# Patient Record
Sex: Female | Born: 1965
Health system: Southern US, Community
[De-identification: ages and names within clinical notes are randomized; demographics above are authoritative.]

## PROBLEM LIST (undated history)

## (undated) DIAGNOSIS — F319 Bipolar disorder, unspecified: Secondary | ICD-10-CM

## (undated) DIAGNOSIS — M797 Fibromyalgia: Secondary | ICD-10-CM

## (undated) DIAGNOSIS — C569 Malignant neoplasm of unspecified ovary: Secondary | ICD-10-CM

## (undated) DIAGNOSIS — K7689 Other specified diseases of liver: Secondary | ICD-10-CM

## (undated) DIAGNOSIS — G4730 Sleep apnea, unspecified: Secondary | ICD-10-CM

## (undated) DIAGNOSIS — C4359 Malignant melanoma of other part of trunk: Secondary | ICD-10-CM

## (undated) DIAGNOSIS — F32A Depression, unspecified: Secondary | ICD-10-CM

## (undated) DIAGNOSIS — J449 Chronic obstructive pulmonary disease, unspecified: Secondary | ICD-10-CM

## (undated) DIAGNOSIS — T7840XA Allergy, unspecified, initial encounter: Secondary | ICD-10-CM

## (undated) DIAGNOSIS — F419 Anxiety disorder, unspecified: Secondary | ICD-10-CM

## (undated) DIAGNOSIS — M549 Dorsalgia, unspecified: Secondary | ICD-10-CM

## (undated) DIAGNOSIS — Q613 Polycystic kidney, unspecified: Secondary | ICD-10-CM

## (undated) DIAGNOSIS — M722 Plantar fascial fibromatosis: Secondary | ICD-10-CM

## (undated) DIAGNOSIS — M1990 Unspecified osteoarthritis, unspecified site: Secondary | ICD-10-CM

## (undated) DIAGNOSIS — E7800 Pure hypercholesterolemia, unspecified: Secondary | ICD-10-CM

## (undated) DIAGNOSIS — Z8719 Personal history of other diseases of the digestive system: Secondary | ICD-10-CM

## (undated) DIAGNOSIS — I499 Cardiac arrhythmia, unspecified: Secondary | ICD-10-CM

## (undated) DIAGNOSIS — G8929 Other chronic pain: Secondary | ICD-10-CM

## (undated) DIAGNOSIS — F329 Major depressive disorder, single episode, unspecified: Secondary | ICD-10-CM

## (undated) DIAGNOSIS — I10 Essential (primary) hypertension: Secondary | ICD-10-CM

## (undated) DIAGNOSIS — K431 Incisional hernia with gangrene: Secondary | ICD-10-CM

## (undated) DIAGNOSIS — N200 Calculus of kidney: Secondary | ICD-10-CM

## (undated) DIAGNOSIS — C539 Malignant neoplasm of cervix uteri, unspecified: Secondary | ICD-10-CM

## (undated) DIAGNOSIS — J42 Unspecified chronic bronchitis: Secondary | ICD-10-CM

## (undated) DIAGNOSIS — K219 Gastro-esophageal reflux disease without esophagitis: Secondary | ICD-10-CM

## (undated) DIAGNOSIS — K589 Irritable bowel syndrome without diarrhea: Secondary | ICD-10-CM

## (undated) DIAGNOSIS — IMO0002 Reserved for concepts with insufficient information to code with codable children: Secondary | ICD-10-CM

## (undated) HISTORY — PX: EYE SURGERY: SHX253

## (undated) HISTORY — PX: HERNIA REPAIR: SHX51

## (undated) HISTORY — PX: TUBAL LIGATION: SHX77

## (undated) HISTORY — DX: Plantar fascial fibromatosis: M72.2

## (undated) HISTORY — DX: Allergy, unspecified, initial encounter: T78.40XA

## (undated) HISTORY — PX: CARPAL TUNNEL RELEASE: SHX101

## (undated) HISTORY — DX: Irritable bowel syndrome, unspecified: K58.9

## (undated) HISTORY — PX: APPENDECTOMY: SHX54

## (undated) HISTORY — DX: Other specified diseases of liver: K76.89

## (undated) HISTORY — DX: Essential (primary) hypertension: I10

## (undated) HISTORY — DX: Fibromyalgia: M79.7

## (undated) HISTORY — DX: Bipolar disorder, unspecified: F31.9

---

## 1995-07-29 HISTORY — PX: LAPAROSCOPIC ABDOMINAL EXPLORATION: SHX6249

## 1995-07-29 HISTORY — PX: ABDOMINAL HYSTERECTOMY: SHX81

## 1996-07-28 HISTORY — PX: CHOLECYSTECTOMY OPEN: SUR202

## 2008-07-28 HISTORY — PX: SHOULDER SURGERY: SHX246

## 2009-05-07 ENCOUNTER — Ambulatory Visit (HOSPITAL_COMMUNITY): Payer: Self-pay | Admitting: Psychiatry

## 2009-05-15 ENCOUNTER — Ambulatory Visit (HOSPITAL_COMMUNITY): Payer: Self-pay | Admitting: Psychiatry

## 2009-05-28 ENCOUNTER — Ambulatory Visit (HOSPITAL_COMMUNITY): Payer: Self-pay | Admitting: Psychiatry

## 2009-06-06 ENCOUNTER — Ambulatory Visit (HOSPITAL_COMMUNITY): Payer: Self-pay | Admitting: Psychiatry

## 2009-06-15 ENCOUNTER — Ambulatory Visit (HOSPITAL_COMMUNITY): Payer: Self-pay | Admitting: Psychiatry

## 2009-06-27 ENCOUNTER — Ambulatory Visit (HOSPITAL_COMMUNITY): Payer: Self-pay | Admitting: Psychiatry

## 2009-07-04 ENCOUNTER — Ambulatory Visit (HOSPITAL_COMMUNITY): Payer: Self-pay | Admitting: Psychiatry

## 2009-07-06 ENCOUNTER — Ambulatory Visit (HOSPITAL_COMMUNITY): Payer: Self-pay | Admitting: Psychiatry

## 2009-07-11 ENCOUNTER — Ambulatory Visit (HOSPITAL_COMMUNITY): Payer: Self-pay | Admitting: Psychiatry

## 2009-07-17 ENCOUNTER — Ambulatory Visit (HOSPITAL_COMMUNITY): Payer: Self-pay | Admitting: Psychiatry

## 2009-07-26 ENCOUNTER — Ambulatory Visit (HOSPITAL_COMMUNITY): Payer: Self-pay | Admitting: Psychiatry

## 2009-08-15 ENCOUNTER — Ambulatory Visit (HOSPITAL_COMMUNITY): Payer: Self-pay | Admitting: Psychiatry

## 2009-08-20 ENCOUNTER — Ambulatory Visit (HOSPITAL_COMMUNITY): Payer: Self-pay | Admitting: Psychiatry

## 2009-09-17 ENCOUNTER — Ambulatory Visit (HOSPITAL_COMMUNITY): Payer: Self-pay | Admitting: Psychiatry

## 2009-11-06 ENCOUNTER — Ambulatory Visit (HOSPITAL_COMMUNITY): Payer: Self-pay | Admitting: Licensed Clinical Social Worker

## 2009-11-07 ENCOUNTER — Ambulatory Visit (HOSPITAL_COMMUNITY): Payer: Self-pay | Admitting: Psychiatry

## 2009-11-30 ENCOUNTER — Ambulatory Visit (HOSPITAL_COMMUNITY): Payer: Self-pay | Admitting: Licensed Clinical Social Worker

## 2009-12-18 ENCOUNTER — Ambulatory Visit (HOSPITAL_COMMUNITY): Payer: Self-pay | Admitting: Licensed Clinical Social Worker

## 2010-02-05 ENCOUNTER — Ambulatory Visit (HOSPITAL_COMMUNITY): Payer: Self-pay | Admitting: Licensed Clinical Social Worker

## 2010-02-06 ENCOUNTER — Ambulatory Visit (HOSPITAL_COMMUNITY): Payer: Self-pay | Admitting: Psychiatry

## 2010-03-20 ENCOUNTER — Ambulatory Visit (HOSPITAL_COMMUNITY): Payer: Self-pay | Admitting: Licensed Clinical Social Worker

## 2010-04-19 ENCOUNTER — Ambulatory Visit (HOSPITAL_COMMUNITY): Payer: Self-pay | Admitting: Licensed Clinical Social Worker

## 2010-05-15 ENCOUNTER — Ambulatory Visit (HOSPITAL_COMMUNITY): Payer: Self-pay | Admitting: Psychiatry

## 2010-05-30 ENCOUNTER — Ambulatory Visit (HOSPITAL_COMMUNITY): Payer: Self-pay | Admitting: Licensed Clinical Social Worker

## 2010-07-04 ENCOUNTER — Ambulatory Visit (HOSPITAL_COMMUNITY): Payer: Self-pay | Admitting: Licensed Clinical Social Worker

## 2010-07-17 ENCOUNTER — Ambulatory Visit (HOSPITAL_COMMUNITY): Payer: Self-pay | Admitting: Licensed Clinical Social Worker

## 2010-07-18 ENCOUNTER — Ambulatory Visit (HOSPITAL_COMMUNITY): Payer: Self-pay | Admitting: Psychiatry

## 2010-07-24 ENCOUNTER — Ambulatory Visit (HOSPITAL_COMMUNITY): Payer: Self-pay | Admitting: Licensed Clinical Social Worker

## 2010-07-31 ENCOUNTER — Ambulatory Visit (HOSPITAL_COMMUNITY)
Admission: RE | Admit: 2010-07-31 | Discharge: 2010-07-31 | Payer: Self-pay | Source: Home / Self Care | Attending: Licensed Clinical Social Worker | Admitting: Licensed Clinical Social Worker

## 2010-08-08 ENCOUNTER — Ambulatory Visit (HOSPITAL_COMMUNITY): Admit: 2010-08-08 | Payer: Self-pay | Admitting: Licensed Clinical Social Worker

## 2010-08-15 ENCOUNTER — Ambulatory Visit (HOSPITAL_COMMUNITY): Admit: 2010-08-15 | Payer: Self-pay | Admitting: Licensed Clinical Social Worker

## 2010-08-23 ENCOUNTER — Ambulatory Visit (HOSPITAL_COMMUNITY): Admit: 2010-08-23 | Payer: Self-pay | Admitting: Psychiatry

## 2010-08-28 ENCOUNTER — Ambulatory Visit (HOSPITAL_COMMUNITY): Admit: 2010-08-28 | Payer: Self-pay | Admitting: Licensed Clinical Social Worker

## 2010-08-28 ENCOUNTER — Encounter (INDEPENDENT_AMBULATORY_CARE_PROVIDER_SITE_OTHER): Payer: PRIVATE HEALTH INSURANCE | Admitting: Licensed Clinical Social Worker

## 2010-08-28 DIAGNOSIS — F3162 Bipolar disorder, current episode mixed, moderate: Secondary | ICD-10-CM

## 2010-09-02 ENCOUNTER — Encounter (HOSPITAL_COMMUNITY): Payer: Self-pay | Admitting: Psychiatry

## 2010-09-05 ENCOUNTER — Encounter (HOSPITAL_COMMUNITY): Payer: PRIVATE HEALTH INSURANCE | Admitting: Licensed Clinical Social Worker

## 2010-11-18 ENCOUNTER — Ambulatory Visit (HOSPITAL_COMMUNITY)
Admission: RE | Admit: 2010-11-18 | Discharge: 2010-11-18 | Disposition: A | Discharge status: Home / Self Care | Payer: PRIVATE HEALTH INSURANCE | Source: Ambulatory Visit | Attending: Neurology | Admitting: Neurology

## 2010-11-18 DIAGNOSIS — R262 Difficulty in walking, not elsewhere classified: Secondary | ICD-10-CM | POA: Insufficient documentation

## 2010-11-18 DIAGNOSIS — M545 Low back pain, unspecified: Secondary | ICD-10-CM | POA: Insufficient documentation

## 2010-11-18 DIAGNOSIS — IMO0001 Reserved for inherently not codable concepts without codable children: Secondary | ICD-10-CM | POA: Insufficient documentation

## 2010-11-18 DIAGNOSIS — M6281 Muscle weakness (generalized): Secondary | ICD-10-CM | POA: Insufficient documentation

## 2010-11-27 ENCOUNTER — Ambulatory Visit (HOSPITAL_COMMUNITY)
Admission: RE | Admit: 2010-11-27 | Discharge: 2010-11-27 | Disposition: A | Discharge status: Home / Self Care | Payer: PRIVATE HEALTH INSURANCE | Source: Ambulatory Visit | Attending: Neurology | Admitting: Neurology

## 2010-11-27 DIAGNOSIS — IMO0001 Reserved for inherently not codable concepts without codable children: Secondary | ICD-10-CM | POA: Insufficient documentation

## 2010-11-27 DIAGNOSIS — M6281 Muscle weakness (generalized): Secondary | ICD-10-CM | POA: Insufficient documentation

## 2010-11-27 DIAGNOSIS — M545 Low back pain, unspecified: Secondary | ICD-10-CM | POA: Insufficient documentation

## 2010-11-27 DIAGNOSIS — R262 Difficulty in walking, not elsewhere classified: Secondary | ICD-10-CM | POA: Insufficient documentation

## 2010-12-06 ENCOUNTER — Ambulatory Visit (HOSPITAL_COMMUNITY)
Admission: RE | Admit: 2010-12-06 | Discharge: 2010-12-06 | Disposition: A | Discharge status: Home / Self Care | Payer: PRIVATE HEALTH INSURANCE | Source: Ambulatory Visit | Attending: *Deleted | Admitting: *Deleted

## 2010-12-12 ENCOUNTER — Ambulatory Visit (HOSPITAL_COMMUNITY): Payer: PRIVATE HEALTH INSURANCE | Admitting: Physical Therapy

## 2011-02-27 ENCOUNTER — Encounter (INDEPENDENT_AMBULATORY_CARE_PROVIDER_SITE_OTHER): Payer: PRIVATE HEALTH INSURANCE | Admitting: Psychiatry

## 2011-02-27 DIAGNOSIS — F3189 Other bipolar disorder: Secondary | ICD-10-CM

## 2011-02-28 NOTE — Progress Notes (Signed)
[name], [name]               ACCOUNT NO.:  [id number]  MEDICAL RECORD NO.:  [id number]  LOCATION:  [facility]                           FACILITY:  [facility]  PHYSICIAN:  Carney Saxton T. Boomer Winders, M.D.   DATE OF BIRTH:  19-Apr-1966                                PROGRESS NOTE  Date; 02/27/11 The patient is a 45 year old, Caucasian, unemployed, single woman who was seen by Dr. Christell Constant in the [place] office.  She recently moved to this area and now wants to establish care in this office.  The patient moved to this area almost 7 months ago.  She tried to see [facility]. However, she did not like the provider and therapists and wants to continue her care at [facility].  The patient has history of bipolar disorder, PTSD and panic disorder.  She has been taking multiple psychiatric medications.  However, recently, she has been out of her trazodone and complained of poor sleep, racing thoughts and increased anxiety.  She was also diagnosed recently with fibromyalgia and is taking tramadol and gabapentin for her fibromyalgia pain.  The patient is overall stable on her medication.  She reported no side effects of medication.  However, on occasion, she complained of nausea which has been for the past 4 months.  She has not seen her primary care doctor but is trying to get an appointment very soon.  The patient denies any active or passive suicidal thoughts but endorsed some residual mood lability, anger, crying spells and agitation.  She does not want to change her medication which has helped her in the past 2 years.  PAST PSYCHIATRIC HISTORY: The patient has at least nine psychiatric admissions.  Her last psychiatric admission was at [facility] five years ago due to suicidal attempt and thinking.  She admitted that she has history of paranoid behavior, auditory hallucinations, severe anger and then going into severe depression.  In the past, she had tried Zoloft, Risperdal, Abilify, Seroquel,  Saphris, Effexor, Xanax and Klonopin.  She reported that some of these medications did not work at all and some of the medications cause increased weight gain and blood sugar.  PSYCHOLOGICAL HISTORY: The patient was born and raised in [place], [place].  She has been married three times.  She has two girls and two stepdaughters. She was adopted when she was an infant.  Recently, she moved to this area to live close to her boyfriend.  EDUCATION AND WORK HISTORY: The patient has 2 years of college.  Currently, she is not working.  MEDICAL HISTORY: The patient has multiple medical problems.  She has history of asthma, neuropathy and degenerative disease and was recently diagnosed with fibromyalgia.  Her primary care doctor is Dr. Alwyn Ren in [place]. Her neurologist is Dr. Craige Cotta at [facility].  CURRENT MEDICATIONS: 1. Ventolin cuff inhaler. 2. Lexapro 20 mg daily. 3. Neurontin 300 mg twice daily. 4. Tramadol as needed. 5. Lamictal 200 mg daily. 6. Trazodone 100 mg which she has not been taking for the past few     months.  ALLERGIES: 1. TETRACYCLINE. 2. ERYTHROMYCIN. 3. ASPIRIN. 4. SULFA.  MENTAL STATUS EXAM: The patient is a pleasant female who is casually dressed.  She appears to be her stated age.  She is fairly groomed.  Her speech is soft, relevant and coherent.  Her thought process is logical, linear and goal directed.  Her attention and concentration were distracted at times. She denies any active or passive suicidal thoughts or any auditory, visual or tactile hallucinations.  Her fund of knowledge is adequate. There are no delusions, psychosis or obsessions present at this time. She is alert and oriented x3.  Her insight, judgment and impulse control were okay.  DIAGNOSIS: AXIS I:  Bipolar disorder, not otherwise specified; post traumatic stress disorder by history; panic disorder by history. AXIS II:  Deferred. AXIS III:  See medical  history. AXIS IV:  Mild to moderate. AXIS V:  55-60.  PLAN: I talked to the patient at length.  We will resume her trazodone which will help her insomnia and residual anxiety.  I have recommended she see a primary care doctor for persistent nausea.  However, I also recommended she take Lamictal 200 mg 1/2 twice daily as Lamictal may be causing nausea.  I explained the risks and benefits of the medication including any metabolic side effects or rash with the Lamictal.  She will continue Lexapro 20 mg daily.  I have recommended she bring the list of all of her medications on her next followup.  I will also schedule an appointment with a therapist for increased coping and social skills.  We will get collateral information from her primary care doctor including any recent lab if she had any done.  We also talked about safety plan.  If she has worsening of the symptoms or at any time has suicidal thoughts or homicidal thoughts, she needs to call 9-1-1 or go to local ER.  I will see her again in 4 weeks.     Lyrik Buresh T. Lolly Mustache, M.D.     STA/MEDQ  D:  02/27/2011  T:  02/27/2011  Job:  [id number]  Electronically Signed by Kathryne Sharper M.D. on 02/28/2011 09:44:18 AM

## 2011-03-10 ENCOUNTER — Ambulatory Visit (INDEPENDENT_AMBULATORY_CARE_PROVIDER_SITE_OTHER): Payer: PRIVATE HEALTH INSURANCE | Admitting: Psychiatry

## 2011-03-10 DIAGNOSIS — F319 Bipolar disorder, unspecified: Secondary | ICD-10-CM

## 2011-03-10 DIAGNOSIS — F410 Panic disorder [episodic paroxysmal anxiety] without agoraphobia: Secondary | ICD-10-CM

## 2011-03-10 DIAGNOSIS — F4310 Post-traumatic stress disorder, unspecified: Secondary | ICD-10-CM

## 2011-03-18 ENCOUNTER — Encounter (HOSPITAL_COMMUNITY): Payer: PRIVATE HEALTH INSURANCE | Admitting: Psychiatry

## 2011-03-26 ENCOUNTER — Encounter (HOSPITAL_COMMUNITY): Payer: PRIVATE HEALTH INSURANCE | Admitting: Psychiatry

## 2011-03-27 ENCOUNTER — Encounter (INDEPENDENT_AMBULATORY_CARE_PROVIDER_SITE_OTHER): Payer: PRIVATE HEALTH INSURANCE | Admitting: Psychiatry

## 2011-03-27 DIAGNOSIS — F410 Panic disorder [episodic paroxysmal anxiety] without agoraphobia: Secondary | ICD-10-CM

## 2011-03-27 DIAGNOSIS — F4310 Post-traumatic stress disorder, unspecified: Secondary | ICD-10-CM

## 2011-03-27 DIAGNOSIS — F319 Bipolar disorder, unspecified: Secondary | ICD-10-CM

## 2011-04-08 ENCOUNTER — Encounter (HOSPITAL_COMMUNITY): Payer: PRIVATE HEALTH INSURANCE | Admitting: Psychiatry

## 2011-05-05 ENCOUNTER — Encounter (INDEPENDENT_AMBULATORY_CARE_PROVIDER_SITE_OTHER): Payer: PRIVATE HEALTH INSURANCE | Admitting: Psychiatry

## 2011-05-05 DIAGNOSIS — F319 Bipolar disorder, unspecified: Secondary | ICD-10-CM

## 2011-05-06 ENCOUNTER — Encounter (INDEPENDENT_AMBULATORY_CARE_PROVIDER_SITE_OTHER): Payer: PRIVATE HEALTH INSURANCE | Admitting: Psychiatry

## 2011-05-06 DIAGNOSIS — F3189 Other bipolar disorder: Secondary | ICD-10-CM

## 2011-05-16 ENCOUNTER — Encounter (INDEPENDENT_AMBULATORY_CARE_PROVIDER_SITE_OTHER): Payer: PRIVATE HEALTH INSURANCE | Admitting: Psychiatry

## 2011-05-16 DIAGNOSIS — F4310 Post-traumatic stress disorder, unspecified: Secondary | ICD-10-CM

## 2011-05-16 DIAGNOSIS — F410 Panic disorder [episodic paroxysmal anxiety] without agoraphobia: Secondary | ICD-10-CM

## 2011-05-16 DIAGNOSIS — F319 Bipolar disorder, unspecified: Secondary | ICD-10-CM

## 2011-05-21 ENCOUNTER — Encounter (INDEPENDENT_AMBULATORY_CARE_PROVIDER_SITE_OTHER): Payer: PRIVATE HEALTH INSURANCE | Admitting: Psychiatry

## 2011-05-21 DIAGNOSIS — F319 Bipolar disorder, unspecified: Secondary | ICD-10-CM

## 2011-05-21 DIAGNOSIS — F4310 Post-traumatic stress disorder, unspecified: Secondary | ICD-10-CM

## 2011-05-26 ENCOUNTER — Emergency Department (HOSPITAL_COMMUNITY)
Admission: EM | Admit: 2011-05-26 | Discharge: 2011-05-26 | Disposition: A | Discharge status: Home / Self Care | Payer: PRIVATE HEALTH INSURANCE | Attending: Emergency Medicine | Admitting: Emergency Medicine

## 2011-05-26 ENCOUNTER — Emergency Department (HOSPITAL_COMMUNITY): Payer: PRIVATE HEALTH INSURANCE

## 2011-05-26 ENCOUNTER — Encounter: Payer: Self-pay | Admitting: *Deleted

## 2011-05-26 DIAGNOSIS — Y92009 Unspecified place in unspecified non-institutional (private) residence as the place of occurrence of the external cause: Secondary | ICD-10-CM | POA: Insufficient documentation

## 2011-05-26 DIAGNOSIS — S139XXA Sprain of joints and ligaments of unspecified parts of neck, initial encounter: Secondary | ICD-10-CM | POA: Insufficient documentation

## 2011-05-26 DIAGNOSIS — S46919A Strain of unspecified muscle, fascia and tendon at shoulder and upper arm level, unspecified arm, initial encounter: Secondary | ICD-10-CM

## 2011-05-26 DIAGNOSIS — M25519 Pain in unspecified shoulder: Secondary | ICD-10-CM | POA: Insufficient documentation

## 2011-05-26 DIAGNOSIS — M542 Cervicalgia: Secondary | ICD-10-CM | POA: Insufficient documentation

## 2011-05-26 DIAGNOSIS — W19XXXA Unspecified fall, initial encounter: Secondary | ICD-10-CM | POA: Insufficient documentation

## 2011-05-26 DIAGNOSIS — S161XXA Strain of muscle, fascia and tendon at neck level, initial encounter: Secondary | ICD-10-CM

## 2011-05-26 HISTORY — DX: Bipolar disorder, unspecified: F31.9

## 2011-05-26 HISTORY — DX: Reserved for concepts with insufficient information to code with codable children: IMO0002

## 2011-05-26 MED ORDER — HYDROMORPHONE HCL 1 MG/ML IJ SOLN
1.0000 mg | Freq: Once | INTRAMUSCULAR | Status: AC
  Administered 2011-05-26: 1 mg via INTRAMUSCULAR
  Filled 2011-05-26: qty 1

## 2011-05-26 MED ORDER — HYDROCODONE-ACETAMINOPHEN 5-325 MG PO TABS: 1.0000 | ORAL_TABLET | ORAL | Status: AC | PRN
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
## 2011-05-26 NOTE — ED Notes (Signed)
Phila collar in place
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
## 2011-05-26 NOTE — ED Notes (Signed)
Patient is resting comfortably. 

## 2011-05-26 NOTE — ED Notes (Signed)
Returned from xray

## 2011-05-29 HISTORY — PX: UPPER GASTROINTESTINAL ENDOSCOPY: SHX188

## 2011-05-29 HISTORY — PX: COLONOSCOPY: SHX174
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
## 2011-06-02 NOTE — ED Provider Notes (Signed)
Medical screening examination/treatment/procedure(s) were performed by non-physician practitioner and as supervising physician I was immediately available for consultation/collaboration.  Milani Lowenstein, MD 06/02/11 0018 

## 2011-06-03 ENCOUNTER — Ambulatory Visit (INDEPENDENT_AMBULATORY_CARE_PROVIDER_SITE_OTHER): Payer: PRIVATE HEALTH INSURANCE | Admitting: Psychiatry

## 2011-06-03 ENCOUNTER — Encounter (HOSPITAL_COMMUNITY): Payer: Self-pay | Admitting: Psychiatry

## 2011-06-03 DIAGNOSIS — F3181 Bipolar II disorder: Secondary | ICD-10-CM

## 2011-06-03 DIAGNOSIS — F3189 Other bipolar disorder: Secondary | ICD-10-CM

## 2011-06-03 NOTE — Patient Instructions (Signed)
Discussed orally .Marland Kitchen..use  coping and relaxation techniques ( journaling, reading , pacing self). Identify and challenge thought patterns.
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
## 2011-06-17 ENCOUNTER — Ambulatory Visit (HOSPITAL_COMMUNITY): Payer: PRIVATE HEALTH INSURANCE | Admitting: Psychiatry

## 2011-06-28 ENCOUNTER — Other Ambulatory Visit (HOSPITAL_COMMUNITY): Payer: Self-pay | Admitting: Psychiatry

## 2011-07-01 ENCOUNTER — Encounter (HOSPITAL_COMMUNITY): Payer: Self-pay | Admitting: Psychiatry

## 2011-07-01 ENCOUNTER — Encounter (HOSPITAL_COMMUNITY): Payer: PRIVATE HEALTH INSURANCE | Admitting: Psychiatry

## 2011-07-01 ENCOUNTER — Ambulatory Visit (INDEPENDENT_AMBULATORY_CARE_PROVIDER_SITE_OTHER): Payer: PRIVATE HEALTH INSURANCE | Admitting: Psychiatry

## 2011-07-01 DIAGNOSIS — F3189 Other bipolar disorder: Secondary | ICD-10-CM

## 2011-07-01 MED ORDER — LAMOTRIGINE 100 MG PO TABS: 100.0000 mg | ORAL_TABLET | Freq: Two times a day (BID) | ORAL | Status: DC

## 2011-07-01 MED ORDER — TRAZODONE HCL 100 MG PO TABS: 50.0000 mg | ORAL_TABLET | Freq: Every day | ORAL | Status: DC

## 2011-07-01 MED ORDER — ESCITALOPRAM OXALATE 20 MG PO TABS: 20.0000 mg | ORAL_TABLET | Freq: Every day | ORAL | Status: DC

## 2011-07-01 NOTE — Progress Notes (Signed)
Patient came for her followup appointment. She has been stable on her current medication. She continued to endorse back pain and seen by pain physician. She has also had verve conduction studies done. She is taking Lamictal 100 mg twice a trazodone 50 to 100 mg at bedtime and Lexapro 20 mg daily. She has been compliant with her medication and reported no side effects. She is sleeping better and her anxiety and agitation is well controlled. She reported she had a good Thanksgiving and she is looking for work her Christmas. She denies any agitation anger or mood swings.  Mental status examination Patient is casually dressed and well groomed. Her speech is soft slow but clear and coherent. She appears anxious and her affect is mood congruent. She denies any active or passive suicidal thinking or homicidal thinking. There are no psychotic symptoms present. She described her mood is anxious. She denies any auditory visual hallucination. She's alert and oriented x3. Her insight judgment and pulse control is okay.  Assessment Bipolar disorder NOS  Plan I will continue his current medication which is Lexapro 20 mg daily Lamictal 100 mg twice a day and trazodone 50-100 mg at bedtime. She denies any itching rash or any other side effects of the medication. I explained the risk and benefits of medication and recommended to call if she has any question concerns about medication. I will see her again in 2 months

## 2011-07-09 ENCOUNTER — Encounter (HOSPITAL_COMMUNITY): Payer: Self-pay | Admitting: Psychiatry

## 2011-07-09 ENCOUNTER — Ambulatory Visit (INDEPENDENT_AMBULATORY_CARE_PROVIDER_SITE_OTHER): Payer: PRIVATE HEALTH INSURANCE | Admitting: Psychiatry

## 2011-07-09 DIAGNOSIS — F3189 Other bipolar disorder: Secondary | ICD-10-CM

## 2011-07-09 DIAGNOSIS — F3181 Bipolar II disorder: Secondary | ICD-10-CM

## 2011-07-09 NOTE — Patient Instructions (Signed)
Discussed orally - use relaxation techniques (breathing techniques, use journal), continue self-care efforts
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
## 2011-07-23 ENCOUNTER — Ambulatory Visit (HOSPITAL_COMMUNITY): Payer: PRIVATE HEALTH INSURANCE | Admitting: Psychiatry

## 2011-07-29 HISTORY — PX: MELANOMA EXCISION: SHX5266

## 2011-09-02 ENCOUNTER — Ambulatory Visit (HOSPITAL_COMMUNITY): Payer: PRIVATE HEALTH INSURANCE | Admitting: Psychiatry

## 2011-09-05 ENCOUNTER — Encounter (HOSPITAL_COMMUNITY): Payer: Self-pay | Admitting: Psychiatry

## 2011-09-05 ENCOUNTER — Ambulatory Visit (INDEPENDENT_AMBULATORY_CARE_PROVIDER_SITE_OTHER): Payer: PRIVATE HEALTH INSURANCE | Admitting: Psychiatry

## 2011-09-05 DIAGNOSIS — F3189 Other bipolar disorder: Secondary | ICD-10-CM

## 2011-09-05 DIAGNOSIS — F3181 Bipolar II disorder: Secondary | ICD-10-CM
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
## 2011-09-05 NOTE — Patient Instructions (Signed)
Discussed orally 

## 2011-09-16 ENCOUNTER — Ambulatory Visit (INDEPENDENT_AMBULATORY_CARE_PROVIDER_SITE_OTHER): Payer: PRIVATE HEALTH INSURANCE | Admitting: Psychiatry

## 2011-09-16 ENCOUNTER — Encounter (HOSPITAL_COMMUNITY): Payer: Self-pay | Admitting: Psychiatry

## 2011-09-16 VITALS — Wt 183.0 lb

## 2011-09-16 DIAGNOSIS — F319 Bipolar disorder, unspecified: Secondary | ICD-10-CM

## 2011-09-16 MED ORDER — HALOPERIDOL 1 MG PO TABS: 1.0000 mg | ORAL_TABLET | Freq: Every day | ORAL | Status: DC

## 2011-09-16 NOTE — Progress Notes (Addendum)
Chief complaint I cannot sleep  History of present illness Patient is 46 year-old Caucasian unemployed single woman who came for her followup appointment. Lately patient has been complaining of more anxiety nervousness and insomnia. She is concerned about her sister who has leukemia and refusing treatment and hospice care. Patient admitted recently she has poor attention and poor concentration and feeling of hopeless. She also endorse increased paranoia and hearing voices in the night time. She also admitted easily irritable angry and agitated. She admitted thinking about her sister making her cry and tearful. She has been isolated and withdrawn. She is compliant with her medication and even taking trazodone 200 mg with little help him sleep. She is also complaining of back pain and unable to move around. She complain of throwing up with Lamictal and wants to try a different medication if possible.  Past psychiatric history Patient has significant history of psychiatric illness with at least 9 psychiatric admission. Her last psychiatric admission was at [facility] 5 years ago. Patient has history of suicidal attempt and thinking. She has history of paranoid behavior and auditory hallucination with severe anger. In the past she had tried Zoloft Risperdal Abilify Seroquel Effexor Xanax Klonopin and saphris. She'll call Seroquel cause weight gain and high blood sugar. She recalled Abilify give her tremors.  Psychosocial history Patient was born in [place]. She has been married 3 times. She has to go spent too stepdaughter. She is living with her boyfriend.  Medical history Patient has history of asthma, neuropathy, and degenerative disease, fibromyalgia and chronic pain. She see Dr. Craige Cotta and [facility]. She has recently blood work but she reported normal.  Alcohol and substance use history Patient denies any history of alcohol or illegal substance use  Family history Patient  told her mother was bipolar  Mental status examination Patient is casually dressed and fairly groomed. She has difficult walking due to back pain. She uses stick for walk. She is easily tearful when she is talking about her sister. She maintained fair eye contact. Her speech is soft slow but clear and coherent. She appears anxious depressed and her affect is constricted. She also endorsed auditory hallucination and paranoid thinking however she denies any active or passive suicidal thoughts or homicidal thoughts. There is no tremors or shakes present. She's alert and oriented x3. Her insight judgment and impulse control is okay  Assessment Axis I bipolar disorder NOS Axis II deferred Axis III see medical history Axis IV moderate Axis V 55-60  Plan I recommended to decrease Lamictal to 100 mg a day due to throw up caused by Lamictal. I will add Haldol 1 mg at bedtime to target the paranoia and hallucination. This will also help increase sleep. She will continue trazodone and Celexa. I have explained risks and benefits of medication including extra pyramidal side effects of Haldol. I recommended to call us if she has any question or concern about the medication or if she feels worsening of the symptoms. I also discussed safety plan that anytime if she having suicidal thinking and homicidal thinking then she need to call 911 or go to local ER. I will see her in 2 weeks. Time spent 30 minutes

## 2011-09-18 ENCOUNTER — Ambulatory Visit (HOSPITAL_COMMUNITY): Payer: PRIVATE HEALTH INSURANCE | Admitting: Psychiatry

## 2011-09-30 ENCOUNTER — Emergency Department (HOSPITAL_COMMUNITY): Payer: PRIVATE HEALTH INSURANCE

## 2011-09-30 ENCOUNTER — Encounter (HOSPITAL_COMMUNITY): Payer: Self-pay | Admitting: *Deleted

## 2011-09-30 ENCOUNTER — Emergency Department (HOSPITAL_COMMUNITY)
Admission: EM | Admit: 2011-09-30 | Discharge: 2011-09-30 | Disposition: A | Discharge status: Home / Self Care | Payer: PRIVATE HEALTH INSURANCE | Attending: Emergency Medicine | Admitting: Emergency Medicine

## 2011-09-30 DIAGNOSIS — IMO0002 Reserved for concepts with insufficient information to code with codable children: Secondary | ICD-10-CM | POA: Insufficient documentation

## 2011-09-30 DIAGNOSIS — F17200 Nicotine dependence, unspecified, uncomplicated: Secondary | ICD-10-CM | POA: Insufficient documentation

## 2011-09-30 DIAGNOSIS — R112 Nausea with vomiting, unspecified: Secondary | ICD-10-CM | POA: Insufficient documentation

## 2011-09-30 DIAGNOSIS — K589 Irritable bowel syndrome without diarrhea: Secondary | ICD-10-CM | POA: Insufficient documentation

## 2011-09-30 DIAGNOSIS — Z87442 Personal history of urinary calculi: Secondary | ICD-10-CM | POA: Insufficient documentation

## 2011-09-30 DIAGNOSIS — R109 Unspecified abdominal pain: Secondary | ICD-10-CM | POA: Insufficient documentation

## 2011-09-30 DIAGNOSIS — J45909 Unspecified asthma, uncomplicated: Secondary | ICD-10-CM | POA: Insufficient documentation

## 2011-09-30 DIAGNOSIS — R6883 Chills (without fever): Secondary | ICD-10-CM | POA: Insufficient documentation

## 2011-09-30 DIAGNOSIS — N390 Urinary tract infection, site not specified: Secondary | ICD-10-CM | POA: Insufficient documentation

## 2011-09-30 DIAGNOSIS — IMO0001 Reserved for inherently not codable concepts without codable children: Secondary | ICD-10-CM | POA: Insufficient documentation

## 2011-09-30 DIAGNOSIS — F319 Bipolar disorder, unspecified: Secondary | ICD-10-CM | POA: Insufficient documentation

## 2011-09-30 LAB — CBC
Hemoglobin: 14.7 g/dL (ref 12.0–15.0)
Platelets: 332 10*3/uL (ref 150–400)
RBC: 4.83 MIL/uL (ref 3.87–5.11)
WBC: 10.2 10*3/uL (ref 4.0–10.5)

## 2011-09-30 LAB — DIFFERENTIAL
Lymphocytes Relative: 45 % (ref 12–46)
Lymphs Abs: 4.6 10*3/uL — ABNORMAL HIGH (ref 0.7–4.0)
Monocytes Relative: 6 % (ref 3–12)
Neutro Abs: 4.7 10*3/uL (ref 1.7–7.7)
Neutrophils Relative %: 46 % (ref 43–77)

## 2011-09-30 LAB — COMPREHENSIVE METABOLIC PANEL
AST: 15 U/L (ref 0–37)
Albumin: 3.6 g/dL (ref 3.5–5.2)
BUN: 8 mg/dL (ref 6–23)
Calcium: 9.6 mg/dL (ref 8.4–10.5)
Chloride: 104 mEq/L (ref 96–112)
GFR calc non Af Amer: >90 mL/min (ref >90)
Glucose, Bld: 100 mg/dL — ABNORMAL HIGH (ref 70–99)
Potassium: 3.6 mEq/L (ref 3.5–5.1)
Sodium: 139 mEq/L (ref 135–145)
Total Protein: 7.2 g/dL (ref 6.0–8.3)

## 2011-09-30 LAB — URINE MICROSCOPIC-ADD ON

## 2011-09-30 LAB — URINALYSIS, ROUTINE W REFLEX MICROSCOPIC
Specific Gravity, Urine: 1.01 (ref 1.005–1.030)
Urobilinogen, UA: 0.2 mg/dL (ref 0.0–1.0)

## 2011-09-30 MED ORDER — ONDANSETRON HCL 4 MG/2ML IJ SOLN
4.0000 mg | Freq: Once | INTRAMUSCULAR | Status: AC
  Administered 2011-09-30: 4 mg via INTRAVENOUS
  Filled 2011-09-30: qty 2

## 2011-09-30 MED ORDER — MORPHINE SULFATE 4 MG/ML IJ SOLN
6.0000 mg | Freq: Once | INTRAMUSCULAR | Status: AC
  Administered 2011-09-30: 6 mg via INTRAVENOUS
  Filled 2011-09-30: qty 2

## 2011-09-30 MED ORDER — DEXTROSE 5 % IV SOLN
1.0000 g | Freq: Once | INTRAVENOUS | Status: DC
  Filled 2011-09-30: qty 10

## 2011-09-30 MED ORDER — HYDROCODONE-ACETAMINOPHEN 5-325 MG PO TABS: 1.0000 | ORAL_TABLET | Freq: Four times a day (QID) | ORAL | Status: AC | PRN

## 2011-09-30 MED ORDER — PROMETHAZINE HCL 25 MG PO TABS: 25.0000 mg | ORAL_TABLET | Freq: Four times a day (QID) | ORAL | Status: DC | PRN

## 2011-09-30 MED ORDER — CEPHALEXIN 500 MG PO CAPS: 500.0000 mg | ORAL_CAPSULE | Freq: Four times a day (QID) | ORAL | Status: AC

## 2011-09-30 MED ORDER — MORPHINE SULFATE 4 MG/ML IJ SOLN
4.0000 mg | Freq: Once | INTRAMUSCULAR | Status: AC
  Administered 2011-09-30: 4 mg via INTRAVENOUS
  Filled 2011-09-30: qty 1

## 2011-09-30 MED ORDER — DEXTROSE 5 % IV SOLN
INTRAVENOUS | Status: AC
  Administered 2011-09-30: 22:00:00
  Filled 2011-09-30: qty 10

## 2011-09-30 NOTE — ED Provider Notes (Signed)
History  This chart was scribed for Tammy Lennert, MD by [name]. This patient was seen in room APA01/APA01 and the patient's care was started at 7:25PM.  CSN: [id number]  Arrival date & time 09/30/11  1910   First MD Initiated Contact with Patient 09/30/11 1921      Chief Complaint  Patient presents with  . Flank Pain   Patient is a 46 y.o. female presenting with flank pain. The history is provided by the patient. No language interpreter was used.  Flank Pain This is a new problem. The current episode started yesterday. The problem occurs constantly. The problem has been gradually worsening. Pertinent negatives include no chest pain, no abdominal pain, no headaches and no shortness of breath. The symptoms are relieved by heat. She has tried a warm compress for the symptoms. The treatment provided mild relief.    [name] is a 46 y.o. female who presents to the Emergency Department complaining of a constant, non-radiating, generalized left flank pain that started gradually yesterday. She reports that the pain has gradually worsened today after she passed a small kidney stone the size of a pencil tip. She states that chills, nausea, emesis and hematuria described as being blood clots are the associated symptoms. She reports that the pain is worsened by movement and improved mildly with heating pads. She relays that she has a h/o kidney stones and relates her past experiences as being similar to the symptoms she is experiencing now. She also has a h/o asthma, IBS and fibromyalgia. She is a current everyday smoker but denies alcohol use.  Pt's PCP is [name] in [place].   Past Medical History  Diagnosis Date  . Degenerative disc disease   . Asthma   . Bipolar 1 disorder   . Bipolar disorder   . Fibromyalgia   . IBS (irritable bowel syndrome) Diagnosed November 2012  . Renal disorder     Past Surgical History  Procedure Date  . Shoulder surgery     left  .  Abdominal hysterectomy   . Abdominal surgery   . Cesarean section   . Cholecystectomy   . Appendectomy   . Upper gastrointestinal endoscopy November 2012  . Colonoscopy November 2012  . Melanoma removed January 2013    removed from back    Family History  Problem Relation Age of Onset  . Bipolar disorder Mother     never diagnosed but patient suspects mother had bipolar disorder.Marland KitchenMarland KitchenMarland KitchenMarland Kitchenpt was adopeted, only knew birth mother for a short period of time.    History  Substance Use Topics  . Smoking status: Current Everyday Smoker -- 0.5 packs/day    Types: Cigarettes  . Smokeless tobacco: Never Used  . Alcohol Use: No     Review of Systems  Constitutional: Positive for chills. Negative for fatigue.  HENT: Negative for congestion, sinus pressure and ear discharge.   Eyes: Negative for discharge.  Respiratory: Negative for cough and shortness of breath.   Cardiovascular: Negative for chest pain.  Gastrointestinal: Positive for nausea and vomiting. Negative for abdominal pain and diarrhea.  Genitourinary: Positive for hematuria and flank pain. Negative for frequency.  Musculoskeletal: Negative for back pain.  Skin: Negative for rash.  Neurological: Negative for seizures and headaches.  Hematological: Negative.   Psychiatric/Behavioral: Negative for hallucinations.    Allergies  Sulfa antibiotics; Seldane; Tetracyclines & related; Aspirin; and Erythromycin  Home Medications   Current Outpatient Rx  Name Route Sig Dispense Refill  . ALBUTEROL  SULFATE (2.5 MG/3ML) 0.083% IN NEBU Nebulization Take 2.5 mg by nebulization 4 (four) times daily as needed. For shortness of breath     . CYCLOBENZAPRINE HCL 10 MG PO TABS Oral Take 10 mg by mouth every 8 (eight) hours as needed. For muscles spasms     . ESCITALOPRAM OXALATE 20 MG PO TABS Oral Take 1 tablet (20 mg total) by mouth daily. 30 tablet 1  . FUROSEMIDE 40 MG PO TABS Oral Take 40 mg by mouth daily.      Marland Kitchen GABAPENTIN 300 MG PO  CAPS Oral Take 300 mg by mouth 3 (three) times daily. For nerve pain     . HALOPERIDOL 1 MG PO TABS Oral Take 1 tablet (1 mg total) by mouth at bedtime. 30 tablet 0  . LAMOTRIGINE 100 MG PO TABS Oral Take 1 tablet (100 mg total) by mouth 2 (two) times daily. 60 tablet 1  . OMEPRAZOLE 40 MG PO CPDR Oral Take 40 mg by mouth daily.      Marland Kitchen POTASSIUM CHLORIDE CRYS ER 20 MEQ PO TBCR Oral Take 20 mEq by mouth 2 (two) times daily.      . TRAZODONE HCL 100 MG PO TABS Oral Take 0.5-1 tablets (50-100 mg total) by mouth at bedtime. 30 tablet 1    Triage Vitals: BP 128/71  Pulse 109  Temp(Src) 98.6 F (37 C) (Oral)  Resp 22  Ht 5' 0.75" (1.543 m)  Wt 183 lb (83.008 kg)  BMI 34.86 kg/m2  SpO2 99%  Physical Exam  Nursing note and vitals reviewed. Constitutional: She is oriented to person, place, and time. She appears well-developed and well-nourished.  HENT:  Head: Normocephalic and atraumatic.  Eyes: Conjunctivae and EOM are normal. No scleral icterus.  Neck: Neck supple. No thyromegaly present.  Cardiovascular: Normal rate and regular rhythm.  Exam reveals no gallop and no friction rub.   No murmur heard. Pulmonary/Chest: Effort normal and breath sounds normal. No stridor. She has no wheezes. She has no rales. She exhibits no tenderness.  Abdominal: She exhibits no distension. There is tenderness (mildly tender LLQ, moderate in the left lower flank). There is no rebound.  Musculoskeletal: Normal range of motion. She exhibits no edema.  Lymphadenopathy:    She has no cervical adenopathy.  Neurological: She is alert and oriented to person, place, and time. Coordination normal.  Skin: No rash noted. No erythema.  Psychiatric: She has a normal mood and affect. Her behavior is normal.    ED Course  Procedures (including critical care time)  DIAGNOSTIC STUDIES: Oxygen Saturation is 99% on room air, normal by my interpretation.    COORDINATION OF CARE: 7:30PM-Discussed treatment plan with pt  and pt agreed to plan. Discussed pain medications and pt refused to take Dilaudid due to nausea related side-effects. 9:29Pm-Pt rechecked and is still feeling left flank pain. Pt turned down another dose of pain medication. Discussed possible kidney infection and cyst like formations on the kidney and bladder with pt and pt acknowledged findings. Advised pt to follow up with PCP in a few days. Will prescribe pain medications and antibiotics in the meantime. Pt is comfortable being discharged home   Labs Reviewed  DIFFERENTIAL - Abnormal; Notable for the following:    Lymphs Abs 4.6 (*)    All other components within normal limits  COMPREHENSIVE METABOLIC PANEL - Abnormal; Notable for the following:    Glucose, Bld 100 (*)    All other components within normal limits  URINALYSIS,  ROUTINE W REFLEX MICROSCOPIC - Abnormal; Notable for the following:    APPearance HAZY (*)    Hgb urine dipstick LARGE (*)    Protein, ur 30 (*)    Leukocytes, UA SMALL (*)    All other components within normal limits  URINE MICROSCOPIC-ADD ON - Abnormal; Notable for the following:    Squamous Epithelial / LPF MANY (*)    Bacteria, UA MANY (*)    All other components within normal limits  CBC   Ct Abdomen Pelvis Wo Contrast  09/30/2011  *RADIOLOGY REPORT*  Clinical Data: Abdominal pain. Patient reports passing a kidney stone today for the first time.  CT ABDOMEN AND PELVIS WITHOUT CONTRAST  Technique:  Multidetector CT imaging of the abdomen and pelvis was performed following the standard protocol without intravenous contrast.  Comparison: No priors.  Findings:  Lung Bases: Linear opacities in the lung bases bilaterally, favored to represent areas of subsegmental atelectasis.  Abdomen/Pelvis:  Diffusely decreased attenuation throughout the hepatic parenchyma, consistent with hepatic steatosis.  There are two large focal low attenuation hepatic lesions, one centered in segment 4A measuring 4.6 x 3.9 cm, and one in  segment six measuring 7.5 x 9.1 cm.  Both of these are near water attenuation, however, they are not fully characterized on today's noncontrast CT examination.  No additional hepatic lesions are otherwise noted. The patient is status post cholecystectomy.  The unenhanced appearance of the pancreas, spleen and bilateral adrenal glands is unremarkable.  No abnormal calcifications are noted within the collecting system of either kidney, along the course of either ureter, or within the lumen of the urinary bladder.  No hydroureteronephrosis.  There is a small 1.3 cm low attenuation lesion extending exophytically off the posterior aspect of the interpolar left kidney, not characterized on this noncontrast CT examination.  No ascites or pneumoperitoneum and no pathologic distension of bowel.  No pathologic adenopathy noted in the abdomen or pelvis. Status post total abdominal hysterectomy and bilateral salpingo- oophorectomy.  Musculoskeletal: There are no aggressive appearing lytic or blastic lesions noted in the visualized portions of the skeleton.  IMPRESSION: 1.  No acute findings in the abdomen or pelvis to account for the patient's symptoms.  Specifically, no abnormal urinary tract calculi are identified. 2.  There are two very large low attenuation lesions in the liver, largest of which measures 7.5 x 9.1 cm in segment six.  Neither of these are adequately characterized on this limited noncontrast CT examination, however, they are favored to represent large cysts. These could be further evaluated with abdominal ultrasound. 3.  Status post cholecystectomy. 4.  1.3 cm low attenuation lesion off the posterior aspect of the interpolar left kidney, not characterized, but statistically likely to represent a cyst.  This could also be further evaluated at ultrasound examination. 5.  Status post total abdominal hysterectomy and bilateral salpingo- oophorectomy.  Original Report Authenticated By: Florencia Reasons, M.D.      No diagnosis found.    MDM  Uti,  Possible kidneystone  The chart was scribed for me under my direct supervision.  I personally performed the history, physical, and medical decision making and all procedures in the evaluation of this patient.Tammy Lennert, MD 09/30/11 [phone]

## 2011-09-30 NOTE — Discharge Instructions (Signed)
Drink plenty of fluids.  Follow up with your md this week or next

## 2011-09-30 NOTE — ED Notes (Signed)
Hematuria, and passed a kidney stone today.  Lt flank pain. n/v

## 2011-10-02 ENCOUNTER — Ambulatory Visit (INDEPENDENT_AMBULATORY_CARE_PROVIDER_SITE_OTHER): Payer: PRIVATE HEALTH INSURANCE | Admitting: Psychiatry

## 2011-10-02 ENCOUNTER — Encounter (HOSPITAL_COMMUNITY): Payer: Self-pay | Admitting: Psychiatry

## 2011-10-02 VITALS — BP 130/80 | HR 80 | Wt 186.8 lb

## 2011-10-02 DIAGNOSIS — F319 Bipolar disorder, unspecified: Secondary | ICD-10-CM

## 2011-10-02 LAB — URINE CULTURE

## 2011-10-02 MED ORDER — CARBAMAZEPINE 100 MG PO CHEW: 100.0000 mg | CHEWABLE_TABLET | Freq: Two times a day (BID) | ORAL | Status: DC

## 2011-10-02 NOTE — Progress Notes (Signed)
Chief complaint I cannot sleep and I more depressed  History of present illness Patient is 46 year-old Caucasian unemployed single woman who came for her followup appointment. Patient endorsed increased anxiety depression and social isolation. She is concerned about her physical health. She recently seen her primary care physician for urinary infection. She was given antibiotic. She continues to have back pain and symptoms of urgency. She is scheduled to have ultrasound for her kidney and liver to rule out polycystic disease. Patient has stopped Lamictal due to persistent nausea. However her nausea still persists which could be due to urinary tract infection. Patient admitted more isolated and having mood swings. She had tried Haldol 1 mg which help her paranoia but she continues to have severe mood swings and depressive thoughts. She reported no side effects of medication. She continued to feel at times hopeless and helpless. She wants to try a different medication.  Current psychiatric medication Trazodone 100 mg at bedtime Lexapro 20 mg daily  Haldol 1 mg at bedtime  Past psychiatric history Patient has significant history of psychiatric illness with at least 9 psychiatric admission. Her last psychiatric admission was at [facility] 5 years ago. Patient has history of suicidal attempt and thinking. She has history of paranoid behavior and auditory hallucination with severe anger. In the past she had tried Zoloft Risperdal Abilify Seroquel Effexor Xanax Klonopin and saphris. She'll call Seroquel cause weight gain and high blood sugar. She recalled Abilify give her tremors.  Psychosocial history Patient was born in [place]. She has been married 3 times. She has to go spent too stepdaughter. She is living with her boyfriend.  Medical history Patient has history of asthma, neuropathy, and degenerative disease, fibromyalgia and chronic pain. She see Dr. Craige Cotta and [facility]. She  has recently blood work which shows urinary tract infection. She is taking antibiotic.   Alcohol and substance use history Patient denies any history of alcohol or illegal substance use  Family history Patient told her mother was bipolar  Mental status examination Patient is casually dressed and fairly groomed. She has difficult walking due to back pain. She uses stick for walk. She is easily tearful and described her mood is depressed and sad. Her affect is constricted. She maintained fair eye contact. Her speech is soft slow but clear and coherent. She admitted paranoia but denies any auditory or visual hallucination. There is no tremors or shakes present. She's alert and oriented x3. Her insight judgment and impulse control is okay  Assessment Axis I bipolar disorder NOS Axis II deferred Axis III see medical history Axis IV moderate Axis V 55-60  Plan I reviewed  Past history, blood results and previous progress notes. She had never tried Tegretol in the past. I will try small does of Tegretol to target mood lability and depression. She will continue Haldol, Lexapro and trazodone at bedtime. I recommended to call us if she has any question or concern about the medication or if she feels worsening of the symptoms. I also discussed safety plan that anytime if she having suicidal thinking and homicidal thinking then she need to call 911 or go to local ER. I will see her in 2 weeks. Time spent 30 minutes

## 2011-10-03 NOTE — ED Notes (Signed)
+   urine Patient treated with Keflex-sensitive to same-chart appended per protocol MD. 

## 2011-10-03 NOTE — ED Notes (Signed)
+   Urine Patient treated with Keflex-sensitive to same-chart appended per protocol MD. 

## 2011-10-06 ENCOUNTER — Ambulatory Visit (INDEPENDENT_AMBULATORY_CARE_PROVIDER_SITE_OTHER): Payer: PRIVATE HEALTH INSURANCE | Admitting: Psychiatry

## 2011-10-06 DIAGNOSIS — F319 Bipolar disorder, unspecified: Secondary | ICD-10-CM

## 2011-10-08 NOTE — Progress Notes (Signed)
Patient:  [name]   DOB: August 05, 1965  MR Number: [id number]  Location: [facility] Center:  [address] [place], [place], [address]  Start: Monday 10/06/2011 11:30 AM End: Monday 10/06/2011 12:20 PM  Provider/Observer: Florencia Reasons, MSW, LCSW  Billing Code/Service: [phone] psychotherapy 50 outpatient adult  Number of Units: 1 unit/s  Reason for Service: The patient is a 46 year old female who was referred for services by psychiatrist Dr. Lolly Mustache to improve coping skills. The patient has a long-standing history of bipolar disorder, PTSD, and panic disorder. She has been in treatment for 20 years and has had several psychiatric hospitalizations. She continues to experience stress related to childhood trauma. She she reports additional stress related to her chronic health issues and debilitating pain. Patient is seen for follow up appointment.  Participation Level:   Active       Behavioral Observation: Attentive, alert , appropriate, angry      Psychosocial Factors:  Patient reports sister has leukemia and has only 4 months to live. Patient reports recent argument with boyfriend.    Content of Session:  Reviewing symptoms, processing feelings, identifying ways to use support system, identifying coping and relaxation techniques    Current Status:  Patient is experiencing anxiety, anger, but decreased depressed mood.  Patient denies suicidal and homicidal ideations.    Therapeutic Strategy:  Individual Therapy.  Supportive therapy  Patient Progress:  Fair.  Patient reports her boyfriend's ex-wife has been diagnosed with cancer and is not expected to live much longer. Patient expresses her, anger, and disappointment in her boyfriend as he already plans to take a week off in the event of his wife's death but has not agreed that  he would take time off in the event of the death of patient's sister who also has cancer.. Patient states feeling disrespected and insignificant. This also  has triggered increased trust issues for patient. She reports that she thinks boyfriend may still be married to his wife. She is considering leaving the relationship but feels stuck currently due to economic reasons. Therapist and patient discussed ways patient can use her support system and identify coping and relaxation techniques. Patient reports that she has already made arrangements with her nephew to provide transportation in the event of her sister's death. She also reports strong support from her daughter and son-in-law who have offered the patient a place to stay. Patient also continues to use support from her coven. She also reports continuing to use beadwork, journaling, and meditations.    Target Goals:  Improve assertiveness skills and ability to set and maintain boundaries in relationships, improve coping skills to decrease anxiety and panic attacks    Last Reviewed:  05/16/2011    Goals Addressed Today:   Decreasing anxiety     Impression/Diagnosis:  The patient presents with a long-standing history of bipolar disorder, PTSD, and panic disorder. Her current symptoms include anxiety, excessive worry, crying spells, racing thoughts, and sleep difficulty. Bipolar disorder, PTSD by history, panic disorder by history    Diagnosis:  Axis I:  1. Bipolar disorder            Axis II: Deferred

## 2011-10-08 NOTE — Patient Instructions (Signed)
Discussed orally 

## 2011-10-15 ENCOUNTER — Encounter (HOSPITAL_COMMUNITY): Payer: Self-pay

## 2011-10-15 ENCOUNTER — Emergency Department (HOSPITAL_COMMUNITY)
Admission: EM | Admit: 2011-10-15 | Discharge: 2011-10-15 | Discharge status: Against Medical Advice | Payer: PRIVATE HEALTH INSURANCE

## 2011-10-15 HISTORY — DX: Polycystic kidney, unspecified: Q61.3

## 2011-10-15 NOTE — ED Notes (Signed)
Called for room not in waiting room . Per Registration pt left.

## 2011-10-15 NOTE — ED Notes (Signed)
Pt reports on Mar 5 was diagnosed here with UTI.  Says prior to arrival had passed a kidney stone.  Reports had a ct scan here and was told she had cysts on both kidneys and her liver.  Reports polycystic kidney disease runs in her family.  Pt says has had pain in r side of abd and flank  x 3 days.

## 2011-10-16 ENCOUNTER — Ambulatory Visit (HOSPITAL_COMMUNITY): Payer: PRIVATE HEALTH INSURANCE | Admitting: Psychiatry

## 2011-10-23 ENCOUNTER — Ambulatory Visit (INDEPENDENT_AMBULATORY_CARE_PROVIDER_SITE_OTHER): Payer: PRIVATE HEALTH INSURANCE | Admitting: Psychiatry

## 2011-10-23 ENCOUNTER — Encounter (HOSPITAL_COMMUNITY): Payer: Self-pay | Admitting: Psychiatry

## 2011-10-23 DIAGNOSIS — F319 Bipolar disorder, unspecified: Secondary | ICD-10-CM

## 2011-10-23 NOTE — Progress Notes (Signed)
Patient:  [name]   DOB: Nov 26, 1965  MR Number: [id number]  Location: [facility]:  [address] [place], [place], [address]  Start: Thursday 10/23/2011 11:05 AM End: Thursday 10/23/2011 11:55 PM  Provider/Observer: Florencia Reasons, MSW, LCSW  Billing Code/Service: [id number] psychotherapy 50 outpatient adult  Number of Units: 1 unit/s  Reason for Service: The patient is a 46 year old female who was referred for services by psychiatrist Dr. Lolly Mustache to improve coping skills. The patient has a long-standing history of bipolar disorder, PTSD, and panic disorder. She has been in treatment for 20 years and has had several psychiatric hospitalizations. She continues to experience stress related to childhood trauma. She she reports additional stress related to her chronic health issues and debilitating pain. Patient is seen for follow up appointment.  Participation Level:   Active       Behavioral Observation: Attentive, alert , appropriate, angry      Psychosocial Factors:  Patient reports recently finding out that her boyfriend cheated on her.    Content of Session:  Reviewing symptoms, processing feelings, identifying ways to use support system, identifying coping and relaxation techniques    Current Status:  Patient is experiencing anxiety, anger, but decreased depressed mood.  Patient denies suicidal and homicidal ideations.    Therapeutic Strategy:  Individual Therapy.  Supportive therapy  Patient Progress:  Fair.  Patient reports recently finding out that her boyfriend cheated on her. She reports becoming angry when confronting him but being able to remain in control. She reports boyfriend's cheating has triggered thoughts and emotions from patient's trauma history in the relationship with her children's father. She has decided to leave the relationship. They continue to reside together for the present. Patient reports she sleeps on the couch.  She reports she is looking for  another place to stay. Her friends are supportive and are helping patient in her housing search. She reports continued strong support from her daughter but says she can't stay with daughter due to daughter's father's objections. Patient also continues to use support from her coven. She also reports continuing to use beadwork, journaling, and meditations. Therapist and patient explore ways patient can have more time away from her home.   Target Goals:  Improve assertiveness skills and ability to set and maintain boundaries in relationships, improve coping skills to decrease anxiety and panic attacks    Last Reviewed:  05/16/2011    Goals Addressed Today:   Decreasing anxiety     Impression/Diagnosis:  The patient presents with a long-standing history of bipolar disorder, PTSD, and panic disorder. Her current symptoms include anxiety, excessive worry, crying spells, racing thoughts, and sleep difficulty. Bipolar disorder, PTSD by history, panic disorder by history    Diagnosis:  Axis I:  1. Bipolar disorder            Axis II: Deferred

## 2011-10-23 NOTE — Patient Instructions (Signed)
Discussed orally 

## 2011-10-30 ENCOUNTER — Ambulatory Visit (HOSPITAL_COMMUNITY): Payer: Self-pay | Admitting: Psychiatry

## 2011-11-06 ENCOUNTER — Ambulatory Visit (HOSPITAL_COMMUNITY): Payer: Self-pay | Admitting: Psychiatry

## 2011-11-15 ENCOUNTER — Other Ambulatory Visit (HOSPITAL_COMMUNITY): Payer: Self-pay | Admitting: Psychiatry

## 2011-11-17 ENCOUNTER — Encounter (HOSPITAL_COMMUNITY): Payer: Self-pay | Admitting: Psychiatry

## 2011-11-17 ENCOUNTER — Ambulatory Visit (INDEPENDENT_AMBULATORY_CARE_PROVIDER_SITE_OTHER): Payer: PRIVATE HEALTH INSURANCE | Admitting: Psychiatry

## 2011-11-17 ENCOUNTER — Other Ambulatory Visit (HOSPITAL_COMMUNITY): Payer: Self-pay | Admitting: Psychiatry

## 2011-11-17 DIAGNOSIS — F319 Bipolar disorder, unspecified: Secondary | ICD-10-CM

## 2011-11-17 NOTE — Progress Notes (Signed)
Patient:  [name]   DOB: 07/08/1966  MR Number: [id number]  Location: [facility]:  [address] [place], [place], [address]  Start: Monday 11/17/2011 11:05 AM End: Monday 11/17/2011 11:55 AM  Provider/Observer: Florencia Reasons, MSW, LCSW  Billing Code/Service: [id number] psychotherapy 50 outpatient adult  Number of Units: 1 unit/s  Reason for Service: The patient is a 46 year old female who was referred for services by psychiatrist Dr. Lolly Mustache to improve coping skills. The patient has a long-standing history of bipolar disorder, PTSD, and panic disorder. She has been in treatment for 20 years and has had several psychiatric hospitalizations. She continues to experience stress related to childhood trauma. She she reports additional stress related to her chronic health issues and debilitating pain. Patient is seen for follow up appointment.  Participation Level:   Active       Behavioral Observation: Attentive, alert , anxious, tearful      Psychosocial Factors:  Patient reports recently finding out that her boyfriend cheated on her.    Content of Session:  Reviewing symptoms, processing feelings, identifying triggers of anxiety, exploring thought patterns and effects on mood and behavior    Current Status:  Patient is experiencing anxiety but decreased anger and decreased depressed mood. Patient denies suicidal and homicidal ideations.    Therapeutic Strategy:  Individual Therapy.  Supportive therapy  Patient Progress:  Fair.  Patient reports she and her boyfriend are trying to repair their relationship.  She says they had a 4 hour conversation where both voiced their concerns.  Patient reports continued trust issues regarding boyfriend. She reports fear of abandonment, change, and the unknown. Patient reports she now is beginning to second guess other decisions as she fears she may have made an error in judgment regarding her boyfriend's character. She   also reports tendency  to self-blame regarding issues in the relationship with her boyfriend. Patient is maintaining involvement in activities and continues to use beadwork, journaling, and meditations. Patient also is trying to increase physical activity by walking .   Target Goals:  Improve assertiveness skills and ability to set and maintain boundaries in relationships, improve coping skills to decrease anxiety and panic attacks    Last Reviewed:  05/16/2011    Goals Addressed Today:   Decreasing anxiety     Impression/Diagnosis:  The patient presents with a long-standing history of bipolar disorder, PTSD, and panic disorder. Her current symptoms include anxiety, excessive worry, crying spells, racing thoughts, and sleep difficulty. Bipolar disorder, PTSD by history, panic disorder by history    Diagnosis:  Axis I:  1. Bipolar disorder            Axis II: Deferred

## 2011-11-17 NOTE — Patient Instructions (Signed)
Discussed orally 

## 2011-11-20 ENCOUNTER — Encounter (HOSPITAL_COMMUNITY): Payer: Self-pay | Admitting: Psychiatry

## 2011-11-20 ENCOUNTER — Ambulatory Visit (INDEPENDENT_AMBULATORY_CARE_PROVIDER_SITE_OTHER): Payer: PRIVATE HEALTH INSURANCE | Admitting: Psychiatry

## 2011-11-20 VITALS — Wt 184.0 lb

## 2011-11-20 DIAGNOSIS — F319 Bipolar disorder, unspecified: Secondary | ICD-10-CM

## 2011-11-20 MED ORDER — TRAZODONE HCL 100 MG PO TABS: 100.0000 mg | ORAL_TABLET | Freq: Every day | ORAL | Status: DC

## 2011-11-20 MED ORDER — CARBAMAZEPINE 100 MG PO CHEW: 100.0000 mg | CHEWABLE_TABLET | Freq: Two times a day (BID) | ORAL | Status: DC

## 2011-11-20 MED ORDER — ESCITALOPRAM OXALATE 20 MG PO TABS: 20.0000 mg | ORAL_TABLET | Freq: Every day | ORAL | Status: DC
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
## 2011-12-01 ENCOUNTER — Ambulatory Visit (INDEPENDENT_AMBULATORY_CARE_PROVIDER_SITE_OTHER): Payer: PRIVATE HEALTH INSURANCE | Admitting: Psychiatry

## 2011-12-01 DIAGNOSIS — F319 Bipolar disorder, unspecified: Secondary | ICD-10-CM

## 2011-12-01 NOTE — Progress Notes (Signed)
Patient:  [name]   DOB: 1965-09-26  MR Number: [id number]  Location: [facility] Center:  [address] [place], [place], [address]  Start: Monday 12/01/2011 11:05 AM End: Monday 12/01/2011 11:50 AM  Provider/Observer: Florencia Reasons, MSW, LCSW  Billing Code/Service: [phone] psychotherapy 50 outpatient adult  Number of Units: 1 unit/s  Reason for Service: The patient is a 46 year old female who was referred for services by psychiatrist Dr. Lolly Mustache to improve coping skills. The patient has a long-standing history of bipolar disorder, PTSD, and panic disorder. She has been in treatment for 20 years and has had several psychiatric hospitalizations. She continues to experience stress related to childhood trauma. She she reports additional stress related to her chronic health issues and debilitating pain. Patient is seen for follow up appointment.  Participation Level:   Active       Behavioral Observation: Attentive, alert , anxious      Psychosocial Factors:  Patient reports recently finding out she may have cataracts.    Content of Session:  Reviewing symptoms, processing feelings, identifying and challenging thinking errors, reviewing relaxation and coping techniques    Current Status:  Patient is experiencing continued  anxiety but decreased anger and decreased depressed mood.     Therapeutic Strategy:  Individual Therapy.  Supportive therapy, cognitive behavioral therapy  Patient Progress:  Fair.  Patient reports that her doctor informed her during a recent routine eye exam that she may have cataracts. Patient expresses fear regarding her vision and frustration that she continues to have multiple medical issues. She states dreading seeing a neurologist at [facility] next month due to fear that she may be diagnosed with another condition. Therapist works  with patient to identify and challenge thinking errors to reduce anticipatory worry. Patient also reports finding out this weekend  that  her boyfriend lied to her about details about his inappropriate contact with another woman a few weeks ago. This has deepened patient's mistrust in her boyfriend. She also expresses sadness and frustration that he is not affectionate and that he is not sensitive to her feelings. However, she wants to maintain their relationship but is  cautious. Patient has continued to use beadwork, journaling, and meditation. She is looking forward to being able to work in the yard using a [employer].    Target Goals:  Improve assertiveness skills and ability to set and maintain boundaries in relationships, improve coping skills to decrease anxiety and panic attacks    Last Reviewed:  05/16/2011    Goals Addressed Today:   Decreasing anxiety     Impression/Diagnosis:  The patient presents with a long-standing history of bipolar disorder, PTSD, and panic disorder. Her current symptoms include anxiety, excessive worry, crying spells, racing thoughts, and sleep difficulty. Bipolar disorder, PTSD by history, panic disorder by history    Diagnosis:  Axis I:  1. Bipolar 1 disorder            Axis II: Deferred

## 2011-12-01 NOTE — Patient Instructions (Signed)
Discussed orally 

## 2011-12-12 ENCOUNTER — Ambulatory Visit (HOSPITAL_COMMUNITY): Payer: Self-pay | Admitting: Psychiatry

## 2011-12-18 ENCOUNTER — Ambulatory Visit (HOSPITAL_COMMUNITY): Payer: Self-pay | Admitting: Psychiatry

## 2011-12-30 ENCOUNTER — Ambulatory Visit (HOSPITAL_COMMUNITY): Payer: PRIVATE HEALTH INSURANCE | Admitting: Psychiatry

## 2012-04-15 ENCOUNTER — Ambulatory Visit (INDEPENDENT_AMBULATORY_CARE_PROVIDER_SITE_OTHER): Payer: PRIVATE HEALTH INSURANCE | Admitting: Psychiatry

## 2012-04-15 ENCOUNTER — Encounter (HOSPITAL_COMMUNITY): Payer: Self-pay | Admitting: Psychiatry

## 2012-04-15 VITALS — Wt 187.0 lb

## 2012-04-15 DIAGNOSIS — F319 Bipolar disorder, unspecified: Secondary | ICD-10-CM

## 2012-04-15 MED ORDER — ESCITALOPRAM OXALATE 20 MG PO TABS: 20.0000 mg | ORAL_TABLET | Freq: Every day | ORAL | Status: DC

## 2012-04-15 MED ORDER — TRAZODONE HCL 100 MG PO TABS: 100.0000 mg | ORAL_TABLET | Freq: Every day | ORAL | Status: DC

## 2012-04-15 MED ORDER — CARBAMAZEPINE 200 MG PO TABS: 200.0000 mg | ORAL_TABLET | Freq: Every day | ORAL | Status: DC
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
## 2012-04-16 ENCOUNTER — Ambulatory Visit (INDEPENDENT_AMBULATORY_CARE_PROVIDER_SITE_OTHER): Payer: PRIVATE HEALTH INSURANCE | Admitting: Psychiatry

## 2012-04-16 DIAGNOSIS — F319 Bipolar disorder, unspecified: Secondary | ICD-10-CM

## 2012-04-19 NOTE — Patient Instructions (Signed)
Discussed orally 
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
## 2012-05-03 ENCOUNTER — Ambulatory Visit (INDEPENDENT_AMBULATORY_CARE_PROVIDER_SITE_OTHER): Payer: PRIVATE HEALTH INSURANCE | Admitting: Psychiatry

## 2012-05-03 DIAGNOSIS — F319 Bipolar disorder, unspecified: Secondary | ICD-10-CM

## 2012-05-03 NOTE — Patient Instructions (Signed)
Discussed orally 
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
## 2012-05-11 ENCOUNTER — Ambulatory Visit (HOSPITAL_COMMUNITY): Payer: PRIVATE HEALTH INSURANCE | Admitting: Psychiatry

## 2012-05-18 ENCOUNTER — Ambulatory Visit (HOSPITAL_COMMUNITY): Payer: PRIVATE HEALTH INSURANCE | Admitting: Psychiatry

## 2012-05-18 ENCOUNTER — Other Ambulatory Visit (HOSPITAL_COMMUNITY): Payer: Self-pay | Admitting: *Deleted

## 2012-05-18 DIAGNOSIS — F319 Bipolar disorder, unspecified: Secondary | ICD-10-CM

## 2012-05-19 ENCOUNTER — Other Ambulatory Visit (HOSPITAL_COMMUNITY): Payer: Self-pay | Admitting: Psychiatry

## 2012-05-19 ENCOUNTER — Telehealth (HOSPITAL_COMMUNITY): Payer: Self-pay | Admitting: *Deleted

## 2012-05-19 MED ORDER — ESCITALOPRAM OXALATE 20 MG PO TABS: 20.0000 mg | ORAL_TABLET | Freq: Every day | ORAL | Status: DC

## 2012-05-19 MED ORDER — CARBAMAZEPINE 200 MG PO TABS: 200.0000 mg | ORAL_TABLET | Freq: Every day | ORAL | Status: DC

## 2012-05-24 ENCOUNTER — Ambulatory Visit (HOSPITAL_COMMUNITY): Payer: Self-pay | Admitting: Psychiatry

## 2012-05-28 ENCOUNTER — Ambulatory Visit (INDEPENDENT_AMBULATORY_CARE_PROVIDER_SITE_OTHER): Payer: Self-pay | Admitting: Psychiatry

## 2012-05-28 ENCOUNTER — Encounter (HOSPITAL_COMMUNITY): Payer: Self-pay | Admitting: Psychiatry

## 2012-05-28 VITALS — BP 160/92 | HR 84 | Ht 61.25 in | Wt 185.8 lb

## 2012-05-28 DIAGNOSIS — F5105 Insomnia due to other mental disorder: Secondary | ICD-10-CM | POA: Insufficient documentation

## 2012-05-28 DIAGNOSIS — F319 Bipolar disorder, unspecified: Secondary | ICD-10-CM

## 2012-05-28 MED ORDER — ESCITALOPRAM OXALATE 20 MG PO TABS: 20.0000 mg | ORAL_TABLET | Freq: Every day | ORAL | Status: DC

## 2012-05-28 MED ORDER — TRAZODONE HCL 100 MG PO TABS: 100.0000 mg | ORAL_TABLET | Freq: Every day | ORAL | Status: DC

## 2012-05-28 MED ORDER — LITHIUM CARBONATE 300 MG PO TABS: 300.0000 mg | ORAL_TABLET | Freq: Two times a day (BID) | ORAL | Status: DC

## 2012-05-28 NOTE — Patient Instructions (Addendum)
Start Lithium get labs in 5 to 7 days Lab must be drawn 12-14 hours after the last dose.  SAMe 200mg  at night for a week then 400 mg at night  Might help the arthritis.

## 2012-05-28 NOTE — Progress Notes (Signed)
Chief complaint I am back with my boyfriend and I am back on my medications.   Quitting smoking is not an option for me.  History of present illness Patient is 46 year-old Caucasian unemployed single woman comes for her followup appointment.  Pt notes pretty good benefits form the meds.  She is depressed, but not as depressed as she was off the medications.  She is interested in trying something different for the bipolar disorder.  Discussed Lithium.  Answered her questions.  She is content with that direction of treatment.  Discussed some alternates for the back pain.  She is concerned what that might do for her liver.   Current psychiatric medication Trazodone 100 mg at bedtime Lexapro 20 mg daily  Tegretol 200 mg daily   Past psychiatric history Patient has history of psychiatric illness with at least 9 psychiatric admission. Her last psychiatric admission was at [facility] 5 years ago. Patient has history of suicidal attempt and thinking. She has history of paranoid behavior and auditory hallucination with severe anger. In the past she had tried Zoloft Risperdal Abilify Seroquel Effexor Xanax Klonopin and saphris. She recall Seroquel cause weight gain and high blood sugar. She recalled Abilify give her tremors.  Psychosocial history Patient was born and raise in [place]. She has been married 3 times.  She is living with her boyfriend however recently she has a lot of issues with her boyfriend.    Medical history Patient has history of asthma, neuropathy, degenerative disease, fibromyalgia and chronic pain. She see Dr. Craige Cotta in [facility].    Alcohol and substance use history Patient denies any history of alcohol or illegal substance use  Family history Patient told her mother was bipolar  Mental status examination Patient is casually dressed and fairly groomed.  She is very emotional and tearful.  She endorse hallucination and paranoia but denies any active or passive  suicidal thinking.  Her speech is fast and rambling.  Her thought processes circumstantial .  She described her mood is depressed and emotional and her affect is labile.  Her attention and concentration is poor.  She has some paranoia but there were no delusions present.  She has flight of ideas and loose association .  Her attention and concentration is poor.  She's alert and oriented x3.  Her insight and judgment is fair.  Her impulse control is okay.  Assessment Axis I bipolar disorder NOS Axis II deferred Axis III see medical history Axis IV moderate Axis V 55-60  Plan I review her psychiatric symptoms especially noncompliance with medication.  She is interested in shifting to Lithium.  SAMe for her joint pain and depression and insomina. RTC 4 weeks 30 min

## 2012-06-09 ENCOUNTER — Telehealth (HOSPITAL_COMMUNITY): Payer: Self-pay | Admitting: *Deleted

## 2012-06-09 DIAGNOSIS — F319 Bipolar disorder, unspecified: Secondary | ICD-10-CM

## 2012-06-09 MED ORDER — LAMOTRIGINE 25 MG PO TABS: ORAL_TABLET | ORAL | Status: DC

## 2012-06-09 NOTE — Telephone Encounter (Signed)
Had done well for 4 years on Lamictal.  Stopped thinking it was causing vomiting, when it probably was due to acid reflux.  Will restart Lamictal. She will need to get on something for acid reflux.

## 2012-06-30 ENCOUNTER — Encounter (HOSPITAL_COMMUNITY): Payer: Self-pay | Admitting: Psychiatry

## 2012-06-30 ENCOUNTER — Ambulatory Visit (INDEPENDENT_AMBULATORY_CARE_PROVIDER_SITE_OTHER): Payer: PRIVATE HEALTH INSURANCE | Admitting: Psychiatry

## 2012-06-30 VITALS — BP 140/90 | HR 80 | Wt 189.2 lb

## 2012-06-30 DIAGNOSIS — F5105 Insomnia due to other mental disorder: Secondary | ICD-10-CM

## 2012-06-30 DIAGNOSIS — F319 Bipolar disorder, unspecified: Secondary | ICD-10-CM

## 2012-06-30 MED ORDER — TRAZODONE HCL 100 MG PO TABS: 100.0000 mg | ORAL_TABLET | Freq: Every day | ORAL | Status: DC

## 2012-06-30 MED ORDER — LAMOTRIGINE 25 MG PO TABS: ORAL_TABLET | ORAL | Status: DC

## 2012-06-30 MED ORDER — ESCITALOPRAM OXALATE 20 MG PO TABS: 20.0000 mg | ORAL_TABLET | Freq: Every day | ORAL | Status: DC

## 2012-06-30 NOTE — Patient Instructions (Addendum)
"  [employer]" by Camelia Phenes could be very helpful.  "[place]" compiled by [employer] could be helpful.  [employer] is a great source for each of these.   Meditation is where you get your marching orders for the moment.

## 2012-06-30 NOTE — Progress Notes (Signed)
Chief complaint Medication refill and follow-up  History of present illness Patient is 46 year-old Caucasian unemployed single woman comes for her followup appointment.  Lithium caused agitation and hostility.  Pt reports compliance with meds and good mood with out sid effects.  Pt is learning Tai Chi with somee benefit. She is unable to exercise everyday because of pain.  She has started glucosamine and chondroitin sulfate with benefit.  She is also on Tumeric with benefit.  She is using meditation three times a day with benefit.  She is noting weight gain. This could be from the Lexapro for 6 years.   Current psychiatric medication Trazodone 100 mg at bedtime Lexapro 20 mg daily  Tegretol 200 mg daily   Past psychiatric history Patient has history of psychiatric illness with at least 9 psychiatric admission. Her last psychiatric admission was at [facility] 5 years ago. Patient has history of suicidal attempt and thinking. She has history of paranoid behavior and auditory hallucination with severe anger. In the past she had tried Zoloft Risperdal Abilify Seroquel Effexor Xanax Klonopin and saphris. She recall Seroquel cause weight gain and high blood sugar. She recalled Abilify give her tremors.  Psychosocial history Patient was born and raise in [place]. She has been married 3 times.  She is living with her boyfriend however recently she has a lot of issues with her boyfriend.    Medical history Patient has history of asthma, neuropathy, degenerative disease, fibromyalgia and chronic pain. She see Dr. Craige Cotta in [facility].    Alcohol and substance use history Patient denies any history of alcohol or illegal substance use  Family history Patient told her mother was bipolar  Mental status examination Patient is casually dressed and fairly groomed.  She is very emotional and tearful.  She endorse hallucination and paranoia but denies any active or passive suicidal thinking.   Her speech is fast and rambling.  Her thought processes circumstantial .  She described her mood is depressed and emotional and her affect is labile.  Her attention and concentration is poor.  She has some paranoia but there were no delusions present.  She has flight of ideas and loose association .  Her attention and concentration is poor.  She's alert and oriented x3.  Her insight and judgment is fair.  Her impulse control is okay.  Assessment Axis I bipolar disorder NOS Axis II deferred Axis III see medical history Axis IV moderate Axis V 55-60  Plan I took her vitals.  I reviewed CC, tobacco/med/surg Hx, meds effects/ side effects, problem list, therapies and responses as well as current situation/symptoms discussed options. See orders and pt instructions for more details. RTC 4 weeks 30 min

## 2012-06-30 NOTE — Addendum Note (Signed)
Addended by: Mike Craze on: 06/30/2012 11:14 AM   Modules accepted: Orders

## 2012-07-12 ENCOUNTER — Ambulatory Visit (INDEPENDENT_AMBULATORY_CARE_PROVIDER_SITE_OTHER): Payer: PRIVATE HEALTH INSURANCE | Admitting: Psychiatry

## 2012-07-12 DIAGNOSIS — F319 Bipolar disorder, unspecified: Secondary | ICD-10-CM

## 2012-07-12 NOTE — Progress Notes (Signed)
Patient:  [name]   DOB: 1965-09-20  MR Number: [id number]  Location: [facility]al [facility] Center:  [address] [place], [place], [address]  Start: Monday 07/12/2012 11:00 AM End: Monday 07/12/2012 11:50 AM  Provider/Observer: Florencia Reasons, MSW, LCSW  Billing Code/Service: [phone] psychotherapy 50 outpatient adult  Number of Units: 1 unit/s  Reason for Service: The patient is a 46 year old female who was referred for services by psychiatrist Dr. Lolly Mustache to improve coping skills. The patient has a long-standing history of bipolar disorder, PTSD, and panic disorder. She has been in treatment for 20 years and has had several psychiatric hospitalizations. She continues to experience stress related to childhood trauma. She she reports additional stress related to her chronic health issues and debilitating pain. Patient is seen for follow up appointment.  Participation Level:   Active       Behavioral Observation: Attentive, alert , anxious, tearful, depressed      Psychosocial Factors:  Patient is concerned that her disability benefits may be terminated.     Content of Session:  Reviewing symptoms, processing feelings,  reframing negative statements, identifying use of support system, setting realistic expectations of self    Current Status:  Patient is experiencing depressed mood, increased anxiety, increased worry, increased irritability, and sleep difficulty. She admits passive suicidal ideations of not wanting to be here when she experiences severe pain but denies any intent and plan. She denies any active suicidal and homicidal ideations.     Therapeutic Strategy:  Individual Therapy.  Supportive therapy, cognitive behavioral therapy  Patient Progress:  Fair.  Patient reports she has had increased chronic health issues in the past couple of months and has experienced excruciating pain. She has been diagnosed with arthritis in several parts of her body and is finding it difficult to  walk. She also reports difficulty sitting, standing, or lying down for more than a few minutes at a time due to the pain. She currently is taking tramadol and plans to attend a pain clinic. She reports using marijuana for about a week to try to relieve the pain prior to being prescribed tramadol. She also is scheduled to begin physical therapy Wednesday. She expresses ambivalent feelings about this as she fears experiencing more pain. Patient also reports increased stress due to to 3 hepatic cysts being on her liver. She is scheduled for a CT scan next week. Patient expresses sadness and feelings of worthlessness due to to her reduced level of physical functioning. Therapist works with patient to process her feelings and to set realistic expectations of self. Therapist and patient also discuss distracting activities including patient's beadwork. Patient reports strong emotional support from her boyfriend.     Target Goals:  Improve assertiveness skills and ability to set and maintain boundaries in relationships, improve coping skills to decrease anxiety and panic attacks    Last Reviewed:  05/16/2011    Goals Addressed Today:   Decreasing anxiety     Impression/Diagnosis:  The patient presents with a long-standing history of bipolar disorder, PTSD, and panic disorder. Her current symptoms include anxiety, excessive worry, crying spells, racing thoughts, and sleep difficulty. Bipolar disorder, PTSD by history, panic disorder by history    Diagnosis:  Axis I:  1. Bipolar 1 disorder            Axis II: Deferred

## 2012-07-12 NOTE — Patient Instructions (Addendum)
Discussed orally 

## 2012-07-26 ENCOUNTER — Ambulatory Visit (HOSPITAL_COMMUNITY): Payer: PRIVATE HEALTH INSURANCE | Admitting: Psychiatry

## 2012-07-28 HISTORY — PX: LIVER CYST REMOVAL: SHX5951

## 2012-08-04 DIAGNOSIS — I10 Essential (primary) hypertension: Secondary | ICD-10-CM

## 2012-08-04 HISTORY — DX: Essential (primary) hypertension: I10

## 2012-08-11 ENCOUNTER — Encounter (HOSPITAL_COMMUNITY): Payer: Self-pay | Admitting: Psychiatry

## 2012-08-11 ENCOUNTER — Ambulatory Visit (INDEPENDENT_AMBULATORY_CARE_PROVIDER_SITE_OTHER): Payer: PRIVATE HEALTH INSURANCE | Admitting: Psychiatry

## 2012-08-11 VITALS — Wt 190.6 lb

## 2012-08-11 DIAGNOSIS — F5105 Insomnia due to other mental disorder: Secondary | ICD-10-CM

## 2012-08-11 DIAGNOSIS — F319 Bipolar disorder, unspecified: Secondary | ICD-10-CM

## 2012-08-11 MED ORDER — TRAZODONE HCL 100 MG PO TABS: 100.0000 mg | ORAL_TABLET | Freq: Every day | ORAL | Status: DC

## 2012-08-11 MED ORDER — LAMOTRIGINE 25 MG PO TABS: ORAL_TABLET | ORAL | Status: DC

## 2012-08-11 MED ORDER — ESCITALOPRAM OXALATE 20 MG PO TABS
20.0000 mg | ORAL_TABLET | Freq: Every day | ORAL | Status: DC
Start: 2012-08-11 — End: 2014-01-31

## 2012-08-11 NOTE — Progress Notes (Signed)
[facility] [id number] Progress Note SOKHNA CHRISTOPH MRN: [id number] DOB: 02-May-1966 Age: 47 y.o.  Date: 08/11/2012 Start Time: 11:05 AM End Time: 11:20 AM  Chief Complaint: Chief Complaint  Patient presents with  . Depression  . Follow-up  . Medication Refill  . Manic Behavior    Subjective: "Now that I'm getting my BP under control, I'm doing really well.  I feel it and I don't feel it.  I am blocking it as best as I can".  History of present illness Patient is 47 year-old Caucasian unemployed single woman comes for her followup appointment. Pt reports that she is compliant with the psychotropic medications with good benefit and some side effects.  She notes queasiness after taking the Lamictal, but nothing major.  Pt feels that things are good right where there are.  She is doing Tai Chi 3 times a week.   Current psychiatric medication Trazodone 100 mg at bedtime Lexapro 20 mg daily  Tegretol 200 mg daily   Past psychiatric history Patient has history of psychiatric illness with at least 9 psychiatric admission. Her last psychiatric admission was at [facility] 5 years ago. Patient has history of suicidal attempt and thinking. She has history of paranoid behavior and auditory hallucination with severe anger. In the past she had tried Zoloft Risperdal Abilify Seroquel Effexor Xanax Klonopin and saphris. She recall Seroquel cause weight gain and high blood sugar. She recalled Abilify give her tremors.  Psychosocial history Patient was born and raise in [place]. She has been married 3 times.  She is living with her boyfriend however recently she has a lot of issues with her boyfriend.    Medical history Patient has history of asthma, neuropathy, degenerative disease, fibromyalgia and chronic pain. She see Dr. Craige Cotta in [facility].  She is follow ing up with her hepatologist because the "C" word is being thrown out and they are going to do more studies on the  liver to evaluate the cysts on her liver.   Alcohol and substance use history Patient denies any history of alcohol or illegal substance use  Family history Patient was told her birth mother was bipolar family history includes Anxiety disorder in her mother; Bipolar disorder in her daughter and mother; and Turner syndrome in her daughter.  There is no history of ADD / ADHD, and Alcohol abuse, and Drug abuse, and Dementia, and Depression, and OCD, and Paranoid behavior, and Schizophrenia, and Seizures, and Sexual abuse, and Physical abuse, .  Mental status examination Patient is casually dressed and fairly groomed.  She is very emotional and tearful.  She endorse hallucination and paranoia but denies any active or passive suicidal thinking.  Her speech is fast and rambling.  Her thought processes circumstantial .  She described her mood is depressed and emotional and her affect is labile.  Her attention and concentration is poor.  She has some paranoia but there were no delusions present.  She has flight of ideas and loose association .  Her attention and concentration is poor.  She's alert and oriented x3.  Her insight and judgment is fair.  Her impulse control is okay.  Assessment Axis I bipolar disorder NOS Axis II deferred Axis III see medical history Axis IV moderate Axis V 55-60  Plan I took her vitals.  I reviewed CC, tobacco/med/surg Hx, meds effects/ side effects, problem list, therapies and responses as well as current situation/symptoms discussed options. See orders and pt instructions for more details.  Rudean Curt, MD, [facility]

## 2012-08-11 NOTE — Patient Instructions (Addendum)
Keep it up.  Stay positive.  Keep the Tai Chi up.

## 2012-08-18 ENCOUNTER — Ambulatory Visit (HOSPITAL_COMMUNITY): Payer: Self-pay | Admitting: Psychiatry

## 2012-08-25 ENCOUNTER — Ambulatory Visit (HOSPITAL_COMMUNITY): Payer: Self-pay | Admitting: Psychiatry

## 2012-09-11 ENCOUNTER — Other Ambulatory Visit: Payer: Self-pay

## 2012-09-14 DIAGNOSIS — I10 Essential (primary) hypertension: Secondary | ICD-10-CM | POA: Insufficient documentation

## 2012-09-14 DIAGNOSIS — J449 Chronic obstructive pulmonary disease, unspecified: Secondary | ICD-10-CM | POA: Insufficient documentation

## 2012-09-14 DIAGNOSIS — K219 Gastro-esophageal reflux disease without esophagitis: Secondary | ICD-10-CM | POA: Insufficient documentation

## 2012-09-14 DIAGNOSIS — J45909 Unspecified asthma, uncomplicated: Secondary | ICD-10-CM | POA: Insufficient documentation

## 2012-09-14 DIAGNOSIS — Z720 Tobacco use: Secondary | ICD-10-CM | POA: Insufficient documentation

## 2012-09-14 DIAGNOSIS — F17200 Nicotine dependence, unspecified, uncomplicated: Secondary | ICD-10-CM | POA: Insufficient documentation

## 2012-09-14 DIAGNOSIS — M1990 Unspecified osteoarthritis, unspecified site: Secondary | ICD-10-CM | POA: Insufficient documentation

## 2012-10-08 ENCOUNTER — Ambulatory Visit (HOSPITAL_COMMUNITY): Payer: Self-pay | Admitting: Psychiatry

## 2012-10-22 ENCOUNTER — Ambulatory Visit (HOSPITAL_COMMUNITY): Payer: Self-pay | Admitting: Psychiatry

## 2013-01-01 DIAGNOSIS — F411 Generalized anxiety disorder: Secondary | ICD-10-CM | POA: Insufficient documentation

## 2013-06-02 ENCOUNTER — Other Ambulatory Visit: Payer: Self-pay

## 2013-07-28 HISTORY — PX: CARDIAC CATHETERIZATION: SHX172

## 2013-08-18 DIAGNOSIS — M51379 Other intervertebral disc degeneration, lumbosacral region without mention of lumbar back pain or lower extremity pain: Secondary | ICD-10-CM | POA: Insufficient documentation

## 2013-08-18 DIAGNOSIS — M5441 Lumbago with sciatica, right side: Secondary | ICD-10-CM | POA: Insufficient documentation

## 2013-08-18 DIAGNOSIS — M5137 Other intervertebral disc degeneration, lumbosacral region: Secondary | ICD-10-CM | POA: Insufficient documentation

## 2013-08-18 DIAGNOSIS — M545 Low back pain: Secondary | ICD-10-CM

## 2013-08-18 DIAGNOSIS — M7918 Myalgia, other site: Secondary | ICD-10-CM | POA: Insufficient documentation

## 2013-08-18 DIAGNOSIS — IMO0001 Reserved for inherently not codable concepts without codable children: Secondary | ICD-10-CM | POA: Insufficient documentation

## 2013-08-18 DIAGNOSIS — G8929 Other chronic pain: Secondary | ICD-10-CM | POA: Insufficient documentation

## 2013-08-18 DIAGNOSIS — M5030 Other cervical disc degeneration, unspecified cervical region: Secondary | ICD-10-CM | POA: Insufficient documentation

## 2014-01-31 ENCOUNTER — Encounter (HOSPITAL_COMMUNITY): Payer: Self-pay | Admitting: Psychiatry

## 2014-01-31 ENCOUNTER — Ambulatory Visit (INDEPENDENT_AMBULATORY_CARE_PROVIDER_SITE_OTHER): Payer: PRIVATE HEALTH INSURANCE | Admitting: Psychiatry

## 2014-01-31 VITALS — BP 146/89 | HR 65 | Ht 62.0 in | Wt 187.0 lb

## 2014-01-31 DIAGNOSIS — F4310 Post-traumatic stress disorder, unspecified: Secondary | ICD-10-CM

## 2014-01-31 DIAGNOSIS — F411 Generalized anxiety disorder: Secondary | ICD-10-CM

## 2014-01-31 DIAGNOSIS — F3162 Bipolar disorder, current episode mixed, moderate: Secondary | ICD-10-CM

## 2014-01-31 DIAGNOSIS — F410 Panic disorder [episodic paroxysmal anxiety] without agoraphobia: Secondary | ICD-10-CM | POA: Insufficient documentation

## 2014-01-31 DIAGNOSIS — F319 Bipolar disorder, unspecified: Secondary | ICD-10-CM

## 2014-01-31 MED ORDER — DIVALPROEX SODIUM 500 MG PO DR TAB: 500.0000 mg | DELAYED_RELEASE_TABLET | Freq: Every day | ORAL | Status: DC
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
## 2014-02-08 LAB — COMPLETE METABOLIC PANEL WITH GFR
ALBUMIN: 4 g/dL (ref 3.5–5.2)
ALK PHOS: 77 U/L (ref 39–117)
ALT: 21 U/L (ref 0–35)
AST: 17 U/L (ref 0–37)
BILIRUBIN TOTAL: 0.5 mg/dL (ref 0.2–1.2)
BUN: 12 mg/dL (ref 6–23)
CO2: 27 meq/L (ref 19–32)
Calcium: 8.9 mg/dL (ref 8.4–10.5)
Chloride: 105 mEq/L (ref 96–112)
Creat: 0.74 mg/dL (ref 0.50–1.10)
GLUCOSE: 108 mg/dL — AB (ref 70–99)
POTASSIUM: 4.1 meq/L (ref 3.5–5.3)
SODIUM: 143 meq/L (ref 135–145)
TOTAL PROTEIN: 6.5 g/dL (ref 6.0–8.3)

## 2014-02-08 LAB — GAMMA GT: GGT: 47 U/L (ref 7–51)

## 2014-02-08 LAB — CBC WITH DIFFERENTIAL/PLATELET
BASOS PCT: 1 % (ref 0–1)
Basophils Absolute: 0.1 10*3/uL (ref 0.0–0.1)
EOS ABS: 0.3 10*3/uL (ref 0.0–0.7)
Eosinophils Relative: 3 % (ref 0–5)
HEMATOCRIT: 42.7 % (ref 36.0–46.0)
HEMOGLOBIN: 15.1 g/dL — AB (ref 12.0–15.0)
LYMPHS ABS: 3.6 10*3/uL (ref 0.7–4.0)
Lymphocytes Relative: 37 % (ref 12–46)
MCH: 30.7 pg (ref 26.0–34.0)
MCHC: 35.4 g/dL (ref 30.0–36.0)
MCV: 86.8 fL (ref 78.0–100.0)
MONO ABS: 0.5 10*3/uL (ref 0.1–1.0)
MONOS PCT: 5 % (ref 3–12)
Neutro Abs: 5.3 10*3/uL (ref 1.7–7.7)
Neutrophils Relative %: 54 % (ref 43–77)
Platelets: 335 10*3/uL (ref 150–400)
RBC: 4.92 MIL/uL (ref 3.87–5.11)
RDW: 13.3 % (ref 11.5–15.5)
WBC: 9.8 10*3/uL (ref 4.0–10.5)

## 2014-02-08 LAB — T4, FREE: Free T4: 1.14 ng/dL (ref 0.80–1.80)

## 2014-02-08 LAB — T3: T3, Total: 138.6 ng/dL (ref 80.0–204.0)

## 2014-02-08 LAB — HEMOGLOBIN A1C
Hgb A1c MFr Bld: 6 % — ABNORMAL HIGH (ref <5.7)
MEAN PLASMA GLUCOSE: 126 mg/dL — AB (ref <117)

## 2014-02-08 LAB — FOLATE: FOLATE: 6.8 ng/mL

## 2014-02-08 LAB — TSH: TSH: 2.243 u[IU]/mL (ref 0.350–4.500)

## 2014-02-09 LAB — DRUG SCREEN, URINE
AMPHETAMINE SCRN UR: NEGATIVE
BARBITURATE QUANT UR: NEGATIVE
Benzodiazepines.: NEGATIVE
COCAINE METABOLITES: NEGATIVE
Creatinine,U: 434.3 mg/dL
METHADONE: NEGATIVE
Marijuana Metabolite: NEGATIVE
OPIATES: NEGATIVE
Phencyclidine (PCP): NEGATIVE
Propoxyphene: NEGATIVE

## 2014-04-04 ENCOUNTER — Encounter (HOSPITAL_COMMUNITY): Payer: Self-pay | Admitting: Psychiatry

## 2014-04-04 ENCOUNTER — Ambulatory Visit (INDEPENDENT_AMBULATORY_CARE_PROVIDER_SITE_OTHER): Payer: PRIVATE HEALTH INSURANCE | Admitting: Psychiatry

## 2014-04-04 VITALS — BP 155/87 | HR 77 | Ht 62.0 in | Wt 190.2 lb

## 2014-04-04 DIAGNOSIS — F4310 Post-traumatic stress disorder, unspecified: Secondary | ICD-10-CM

## 2014-04-04 DIAGNOSIS — F411 Generalized anxiety disorder: Secondary | ICD-10-CM

## 2014-04-04 DIAGNOSIS — F3162 Bipolar disorder, current episode mixed, moderate: Secondary | ICD-10-CM

## 2014-04-04 DIAGNOSIS — F410 Panic disorder [episodic paroxysmal anxiety] without agoraphobia: Secondary | ICD-10-CM

## 2014-04-04 DIAGNOSIS — F319 Bipolar disorder, unspecified: Secondary | ICD-10-CM

## 2014-04-04 MED ORDER — MIRTAZAPINE 15 MG PO TABS: 15.0000 mg | ORAL_TABLET | Freq: Every day | ORAL | Status: DC

## 2014-04-04 MED ORDER — DIVALPROEX SODIUM 250 MG PO DR TAB: 750.0000 mg | DELAYED_RELEASE_TABLET | Freq: Every day | ORAL | Status: DC

## 2014-04-04 NOTE — Progress Notes (Signed)
[place] [phone] Progress Note  [name] [id number] 48 y.o.  04/04/2014 10:49 AM  Chief Complaint: "mood is swinging"  History of Present Illness: States mood is not stable. Some days she is happy and others she is very depressed.  Depakote was helping a lot in the beginning but now it doesn't seem to be. Racing thoughts had calmed and she felt good for about 3 weeks but then stopped. Denies SE from Depakote.   Pt is not sleeping well (1.5 hrs/night) and feels on edge. She has continuous racing thoughts that will not calm. Energy is low. Appetite is good and she has gained 3 lbs. Concentration is poor and her crochet is suffering. She is endorsing anhedonia and has no desire to go out or do things.   Reports she had a period of increased energy and cleaned her house for 1 week. She was sleeping 2 hrs/night and mood was elevated.  States she was easily distracted and felt the need to be doing something all the time. Denies impulsivity, financial extravagance, sexually inappropriate behavior.   Anxiety is high and she is having panic attacks 2-3x/week. Pt still doesn't like leaving her home and has a feeling of "pure dread" that something is going to happen.  Suicidal Ideation: Yes 5-6x/week Plan Formed: No Patient has means to carry out plan: No  Homicidal Ideation: No Plan Formed: No Patient has means to carry out plan: No  Review of Systems: Psychiatric: Agitation: Yes Hallucination: Yes pt states her house is haunted. She hears disembodied voices daily. Denies command hallucinations. States her boyfriend will hear them as well.  Depressed Mood: Yes Insomnia: Yes Hypersomnia: Yes Altered Concentration: Yes Feels Worthless: Yes Grandiose Ideas: No Belief In Special Powers: No New/Increased Substance Abuse: No Compulsions: No   Neurologic: Headache: Yes Seizure: No Paresthesias: No  Past Medical, Family, Social History: Pt lives in [place] with her  boyfriend. She was raised by her adoptive parents and has 4 siblings. Pt has been married 3x and is divorced. She has 2 sons. 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 Pt  reports that she has been smoking Cigarettes.  She has a 20 pack-year smoking history. She has never used smokeless tobacco. She reports that she does not drink alcohol or use illicit drugs.  Family History  Problem Relation Age of Onset  . Bipolar disorder Mother     never diagnosed but patient suspects mother had bipolar disorder.Marland KitchenMarland KitchenMarland KitchenMarland Kitchenpt was adopeted, only knew birth mother for a short period of time.  Marland Kitchen Anxiety disorder Mother   . Suicidality Mother   . ADD / ADHD Neg Hx   . Alcohol abuse Neg Hx   . Drug abuse Neg Hx   . Dementia Neg Hx   . Depression Neg Hx   . OCD Neg Hx   . Paranoid behavior Neg Hx   . Schizophrenia Neg Hx   . Seizures Neg Hx   . Sexual abuse Neg Hx   . Physical abuse Neg Hx   . Turner syndrome Daughter   . Bipolar disorder Daughter     Past Medical History  Diagnosis Date  . Degenerative disc disease   . Asthma   . Bipolar 1 disorder   . Bipolar disorder   . Fibromyalgia   . IBS (irritable bowel syndrome) Diagnosed November 2012  . Hypertension 08/04/2012  . Renal disorder     kidney stones   . Polycystic kidney disease  ruled out  . Hepatic cyst      Outpatient Encounter Prescriptions as of 04/04/2014  Medication Sig  . amLODipine (NORVASC) 10 MG tablet Take 10 mg by mouth daily.  . divalproex (DEPAKOTE) 500 MG DR tablet Take 1 tablet (500 mg total) by mouth at bedtime.  . Naproxen Sod-Diphenhydramine (ALEVE PM) 220-25 MG TABS Take 3 capsules by mouth at bedtime as needed (sleep and pain).  . clindamycin (CLEOCIN T) 1 % lotion Apply topically 2 (two) times daily.  . promethazine (PHENERGAN) 25 MG tablet Take 1 tablet (25 mg total) by mouth every 6 (six) hours as needed for nausea.    Past Psychiatric History/Hospitalization(s): Anxiety: Yes Bipolar Disorder:  Yes Depression: Yes Mania: Yes Psychosis: Yes Schizophrenia: No Personality Disorder: No Hospitalization for psychiatric illness: Yes History of Electroconvulsive Shock Therapy: No Prior Suicide Attempts: Yes  Physical Exam: Constitutional:  BP 155/87  Pulse 77  Ht 5\' 2"  (1.575 m)  Wt 190 lb 3.2 oz (86.274 kg)  BMI 34.78 kg/m2  General Appearance: alert, oriented, no acute distress  Musculoskeletal: Strength & Muscle Tone: within normal limits Gait & Station: normal, walking with cane due to pain Patient leans: N/A  Mental Status Examination/Evaluation: Objective: Attitude: Calm and cooperative  Appearance: Casual, appears to be stated age  Eye Contact::  Fair  Speech:  Clear and Coherent and Normal Rate  Volume:  Normal  Mood:  depressed  Affect:  Full Range  Thought Process:  Linear and Logical  Orientation:  Full (Time, Place, and Person)  Thought Content:  Negative  Suicidal Thoughts:  Yes.  without intent/plan  Homicidal Thoughts:  No  Judgement:  Fair  Insight:  Fair  Concentration: good  Memory: Immediate-fair Recent-fair Remote-fair  Recall: fair  Language: fair  Gait and Station: normal  [employer] of Knowledge: average  Psychomotor Activity:  Normal  Akathisia:  No  Handed:  Right     Medical Decision Making (Choose Three): Review of Psycho-Social Stressors (1), Review or order clinical lab tests (1), Established Problem, Worsening (2), Review of Medication Regimen & Side Effects (2) and Review of New Medication or Change in Dosage (2)  Assessment: AXIS I  Bipolar disorder, GAD with panic, PTSD   AXIS II  Deferred   AXIS III  Past Medical History    Diagnosis  Date    .  Degenerative disc disease     .  Asthma     .  Bipolar 1 disorder     .  Bipolar disorder     .  Fibromyalgia     .  IBS (irritable bowel syndrome)  Diagnosed November 2012    .  Hypertension  08/04/2012    .  Renal disorder       kidney stones    .  Polycystic kidney  disease       ruled out    .  Hepatic cyst    AXIS IV  problems related to social environment and problems with access to health care services   AXIS V  51-60 moderate symptoms     Treatment Plan/Recommendations:  Plan of Care: Medication management with supportive therapy. Risks/benefits and SE of the medication discussed. Pt verbalized understanding and verbal consent obtained for treatment. Affirm with the patient that the medications are taken as ordered. Patient expressed understanding of how their medications were to be used.     Laboratory: reviewed labs with pt CBC WNL Chemistry Profile - glucose elevated Folic Acid -  WNL  GGT - WNL HbAIC - mildly elevated. Discussed diet control UDS - neg TSH, T3/T4- WNL   Psychotherapy: Therapy: brief supportive therapy provided. Discussed psychosocial stressors in detail.   Medications: Increase Depakote ER 750mg  po qD for mood lability.  Start trial of Remeron 15mg  po qHS for sleep, anxiety and mood  Routine PRN Medications: No   Consultations: pt declined therapy referral due to anxiety about leaving home   Safety Concerns: Pt endorsing passive SI without plan or intent and is at an acute low risk for suicide .Patient told to call clinic if any problems occur. Patient advised to go to ER if they should develop SI/HI, side effects, or if symptoms worsen. Has crisis numbers to call if needed. Pt verbalized understanding.   Other: F/up in 3 months or sooner if needed     Charlcie Cradle, MD 04/04/2014

## 2014-06-18 ENCOUNTER — Encounter (HOSPITAL_COMMUNITY): Payer: Self-pay | Admitting: *Deleted

## 2014-06-18 ENCOUNTER — Emergency Department (HOSPITAL_COMMUNITY)
Admission: EM | Admit: 2014-06-18 | Discharge: 2014-06-18 | Discharge status: Against Medical Advice | Payer: PRIVATE HEALTH INSURANCE | Attending: Emergency Medicine | Admitting: Emergency Medicine

## 2014-06-18 DIAGNOSIS — I10 Essential (primary) hypertension: Secondary | ICD-10-CM | POA: Insufficient documentation

## 2014-06-18 DIAGNOSIS — J45909 Unspecified asthma, uncomplicated: Secondary | ICD-10-CM | POA: Insufficient documentation

## 2014-06-18 DIAGNOSIS — Z720 Tobacco use: Secondary | ICD-10-CM | POA: Insufficient documentation

## 2014-06-18 DIAGNOSIS — K469 Unspecified abdominal hernia without obstruction or gangrene: Secondary | ICD-10-CM | POA: Insufficient documentation

## 2014-06-18 NOTE — ED Notes (Signed)
Pt walking down hall, asked if she was leaving and pt would not stop and stated "I I I just can't"

## 2014-06-18 NOTE — ED Notes (Signed)
Pt with umbilical hernia that stays "popped out", has attempted to contact PCP with no call back on Friday, + nausea, denies vomiting

## 2014-06-18 NOTE — ED Notes (Signed)
Pt has umbilical hernia that continues to pop out. Pt states that since her hernia has come "out" she has had pain. NAD distress noted at this time.

## 2014-07-04 ENCOUNTER — Encounter (HOSPITAL_COMMUNITY): Payer: Self-pay | Admitting: Psychiatry

## 2014-07-04 ENCOUNTER — Ambulatory Visit (INDEPENDENT_AMBULATORY_CARE_PROVIDER_SITE_OTHER): Payer: PRIVATE HEALTH INSURANCE | Admitting: Psychiatry

## 2014-07-04 VITALS — BP 186/86 | HR 84 | Ht 61.0 in | Wt 189.8 lb

## 2014-07-04 DIAGNOSIS — F314 Bipolar disorder, current episode depressed, severe, without psychotic features: Secondary | ICD-10-CM

## 2014-07-04 DIAGNOSIS — F411 Generalized anxiety disorder: Secondary | ICD-10-CM

## 2014-07-04 DIAGNOSIS — F410 Panic disorder [episodic paroxysmal anxiety] without agoraphobia: Secondary | ICD-10-CM

## 2014-07-04 DIAGNOSIS — F3130 Bipolar disorder, current episode depressed, mild or moderate severity, unspecified: Secondary | ICD-10-CM

## 2014-07-04 DIAGNOSIS — F4310 Post-traumatic stress disorder, unspecified: Secondary | ICD-10-CM

## 2014-07-04 DIAGNOSIS — F3162 Bipolar disorder, current episode mixed, moderate: Secondary | ICD-10-CM

## 2014-07-04 DIAGNOSIS — Z0000 Encounter for general adult medical examination without abnormal findings: Secondary | ICD-10-CM

## 2014-07-04 HISTORY — DX: Bipolar disorder, current episode depressed, mild or moderate severity, unspecified: F31.30

## 2014-07-04 MED ORDER — DIVALPROEX SODIUM 250 MG PO DR TAB: 750.0000 mg | DELAYED_RELEASE_TABLET | Freq: Every day | ORAL | Status: DC

## 2014-07-04 MED ORDER — ARIPIPRAZOLE 5 MG PO TABS: 5.0000 mg | ORAL_TABLET | Freq: Every day | ORAL | Status: DC

## 2014-07-04 MED ORDER — TRAZODONE HCL 100 MG PO TABS: 100.0000 mg | ORAL_TABLET | Freq: Every evening | ORAL | Status: DC | PRN

## 2014-07-04 NOTE — Patient Instructions (Signed)
EKG call [phone] 
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
## 2014-07-06 ENCOUNTER — Telehealth (HOSPITAL_COMMUNITY): Payer: Self-pay

## 2014-07-06 NOTE — Telephone Encounter (Signed)
Pt states she is having stomach cramps and worsening headaches. She is having symptoms after taking Abilify. She is only taking Trazodone rarely. Pt has not tried over the counter pain meds.   A: It has only been 2 days recommend pt continue to take Abilify and if symptoms persist after the weekend then we will d/c Abilify. Pt verbalized understanding and agreed with the plan.

## 2014-07-14 ENCOUNTER — Telehealth (HOSPITAL_COMMUNITY): Payer: Self-pay | Admitting: *Deleted

## 2014-07-14 NOTE — Telephone Encounter (Signed)
Pt called states her medication is not working, she has been crying non-stop for 4.5 hours. States that she is not suicidal or having any self harm thoughts. Just wants her doctor to be aware so she can figure out what else can be done. Her next appointment is in February and she can't go that long feeling this bad.  Explained that Dr. Doyne Keel is not in today but a note would be sent to her. Pt agreeable and will call back if any change in symptoms.

## 2014-07-18 NOTE — Telephone Encounter (Signed)
Called and left voice message for call back to discuss concerns.

## 2014-07-18 NOTE — Telephone Encounter (Signed)
Pt reports she has a "complete meltdown" last week. States she was unable to stop crying that day and it was a bad day. She was having passive SI without plan or intent. Since then she has stopped taking Depakote and is feeling better. Today pt denies SI. She is taking Abilify and is getting out her house more. She is still having panic attacks but they are tolerable. Pt is starting to journal again and is crocheting.  In the last one week she has made a number of items including several blankets and mittens.    A/P: Bipolar disorder- MRE depressed recurrent severe without psychotic features, GAD with panic attacks, PTSD  1. D/c Depakote per pt request 2. Continue Abilify 5mg  qHS

## 2014-07-18 NOTE — Addendum Note (Signed)
Addended by: Charlcie Cradle on: 07/18/2014 10:02 AM   Modules accepted: Orders, Medications

## 2014-09-05 ENCOUNTER — Ambulatory Visit (INDEPENDENT_AMBULATORY_CARE_PROVIDER_SITE_OTHER): Payer: PRIVATE HEALTH INSURANCE | Admitting: Psychiatry

## 2014-09-05 ENCOUNTER — Encounter (HOSPITAL_COMMUNITY): Payer: Self-pay | Admitting: Psychiatry

## 2014-09-05 VITALS — BP 161/80 | HR 62 | Ht 62.0 in | Wt 187.2 lb

## 2014-09-05 DIAGNOSIS — F4310 Post-traumatic stress disorder, unspecified: Secondary | ICD-10-CM

## 2014-09-05 DIAGNOSIS — F411 Generalized anxiety disorder: Secondary | ICD-10-CM

## 2014-09-05 DIAGNOSIS — F3130 Bipolar disorder, current episode depressed, mild or moderate severity, unspecified: Secondary | ICD-10-CM | POA: Diagnosis not present

## 2014-09-05 MED ORDER — TRAZODONE HCL 100 MG PO TABS: 100.0000 mg | ORAL_TABLET | Freq: Every evening | ORAL | Status: DC | PRN

## 2014-09-05 MED ORDER — BUPROPION HCL ER (XL) 150 MG PO TB24: 150.0000 mg | ORAL_TABLET | Freq: Every day | ORAL | Status: DC

## 2014-09-05 MED ORDER — ARIPIPRAZOLE 5 MG PO TABS: 5.0000 mg | ORAL_TABLET | Freq: Every day | ORAL | Status: DC
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
## 2014-09-14 ENCOUNTER — Other Ambulatory Visit (HOSPITAL_COMMUNITY): Payer: Self-pay

## 2014-09-25 ENCOUNTER — Ambulatory Visit (HOSPITAL_COMMUNITY)
Admission: RE | Admit: 2014-09-25 | Discharge: 2014-09-25 | Disposition: A | Discharge status: Home / Self Care | Payer: Medicare Other | Source: Ambulatory Visit | Attending: Psychiatry | Admitting: Psychiatry

## 2014-09-25 DIAGNOSIS — Z0000 Encounter for general adult medical examination without abnormal findings: Secondary | ICD-10-CM

## 2014-11-07 ENCOUNTER — Ambulatory Visit (HOSPITAL_COMMUNITY): Payer: Self-pay | Admitting: Psychiatry

## 2014-12-14 ENCOUNTER — Ambulatory Visit (INDEPENDENT_AMBULATORY_CARE_PROVIDER_SITE_OTHER): Payer: 59 | Admitting: Psychiatry

## 2014-12-14 ENCOUNTER — Encounter (HOSPITAL_COMMUNITY): Payer: Self-pay | Admitting: Psychiatry

## 2014-12-14 VITALS — BP 190/96 | HR 84 | Ht 61.0 in | Wt 186.2 lb

## 2014-12-14 DIAGNOSIS — F4310 Post-traumatic stress disorder, unspecified: Secondary | ICD-10-CM | POA: Diagnosis not present

## 2014-12-14 DIAGNOSIS — F410 Panic disorder [episodic paroxysmal anxiety] without agoraphobia: Secondary | ICD-10-CM

## 2014-12-14 DIAGNOSIS — F3130 Bipolar disorder, current episode depressed, mild or moderate severity, unspecified: Secondary | ICD-10-CM | POA: Diagnosis not present

## 2014-12-14 DIAGNOSIS — F411 Generalized anxiety disorder: Secondary | ICD-10-CM | POA: Diagnosis not present

## 2014-12-14 MED ORDER — ARIPIPRAZOLE 5 MG PO TABS: 5.0000 mg | ORAL_TABLET | Freq: Every day | ORAL | Status: DC

## 2014-12-14 MED ORDER — TRAZODONE HCL 100 MG PO TABS: 100.0000 mg | ORAL_TABLET | Freq: Every evening | ORAL | Status: DC | PRN

## 2014-12-14 MED ORDER — BUPROPION HCL ER (XL) 150 MG PO TB24: 150.0000 mg | ORAL_TABLET | Freq: Every day | ORAL | Status: DC

## 2014-12-14 NOTE — Progress Notes (Signed)
[facility] [phone] Progress Note  [name] [id number] 49 y.o.  12/14/2014 10:29 AM  Chief Complaint: "doing much better"  History of Present Illness: Pt is recovering from the flu.   States the combination of Abilify and Wellbutrin is helping. Sexual SE are improving. Pt is using lubrication during sexual activity. Libido is improving.    Pt is feeling calmer and more level lately. States she is getting out more and she is working on old hobbies and new projects. Pt has gone to [employer] several times and it was a tolerable experience.  Pt has also gone out to eat several times.   Pt has been working on her agoraphobia. She is able to sit on her porch for 30 min and has her windows and doors open. Pt has been mowing her yard. Panic attacks have decreased in frequency to 2x/month. It occurs when she knows she has to go out. Pt forces herself to go out anyway.   Pt is socializing more and is talking to her neighbors more.   Depression is improving. She feels down about one week a month. Denies isolation, low motivation and anhedonia. Crying spells, worthlessness and hopelessness have decreased in intensity. Notices when she is focusing on the past and is able to talk to herself out of it.   For the last 2 nights she has been sleeping about 2-3 hrs/night. Prior she was sleeping about 10 hrs/night. Today energy is high. She is cleaning her house and working on her crafts. She feels the desire to spend money but has her boyfriend watching her money so she doesn't. Mood is carefree but not elevated.  Appetite is ok.   PTSD continues- she hears noises and jumps, HV remains high, she still checks her doors and windows,  Nightmares have decreased to a few a week, intrusive memories are less intense. Pt has taken down the cameras around her house.    Takes Wellbutrin, Trazodone and Abilify as prescribed. Reports SE of dry mouth   Suicidal Ideation: No Plan Formed: No Patient has  means to carry out plan: No  Homicidal Ideation: No Plan Formed: No Patient has means to carry out plan: No  Review of Systems: Psychiatric: Agitation: No Hallucination: Yes pt states her house is haunted. She hears disembodied voices daily. Denies command hallucinations. States her boyfriend will hear them as well. They had a paranormal group do an investigation who found voices, knocking, shadows walking across walls when no one was home.  Depressed Mood: Yes Insomnia: Yes Hypersomnia: No Altered Concentration: No Feels Worthless: Yes Grandiose Ideas: No Belief In Special Powers: No New/Increased Substance Abuse: No Compulsions: No   Neurologic: Headache: No Seizure: No Paresthesias: No   Review of Systems  Constitutional: Positive for fever and chills. Negative for weight loss.  HENT: Positive for congestion. Negative for ear pain, nosebleeds and sore throat.   Eyes: Negative for blurred vision, pain and redness.  Respiratory: Positive for cough and sputum production. Negative for wheezing.   Cardiovascular: Negative for chest pain, palpitations and leg swelling.  Gastrointestinal: Negative for heartburn, nausea, vomiting and abdominal pain.  Musculoskeletal: Positive for back pain and joint pain. Negative for neck pain.  Neurological: Negative for dizziness, tremors, sensory change, seizures, loss of consciousness and headaches.  Psychiatric/Behavioral: Positive for depression and hallucinations. Negative for suicidal ideas and substance abuse. The patient has insomnia. The patient is not nervous/anxious.      Past Medical, Family, Social History: Pt lives in  [place] with her boyfriend. She was raised by her adoptive parents and has 4 siblings. Pt has been married 3x and is divorced. She has 2 sons. 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 Pt  reports that she has been smoking Cigarettes.  She has a 20 pack-year smoking history. She has never used  smokeless tobacco. She reports that she does not drink alcohol or use illicit drugs.  Family History  Problem Relation Age of Onset  . Bipolar disorder Mother     never diagnosed but patient suspects mother had bipolar disorder.Marland KitchenMarland KitchenMarland KitchenMarland Kitchenpt was adopeted, only knew birth mother for a short period of time.  Marland Kitchen Anxiety disorder Mother   . Suicidality Mother   . ADD / ADHD Neg Hx   . Alcohol abuse Neg Hx   . Drug abuse Neg Hx   . Dementia Neg Hx   . Depression Neg Hx   . OCD Neg Hx   . Paranoid behavior Neg Hx   . Schizophrenia Neg Hx   . Seizures Neg Hx   . Sexual abuse Neg Hx   . Physical abuse Neg Hx   . Turner syndrome Daughter   . Bipolar disorder Daughter     Past Medical History  Diagnosis Date  . Degenerative disc disease   . Asthma   . Bipolar 1 disorder   . Bipolar disorder   . Fibromyalgia   . IBS (irritable bowel syndrome) Diagnosed November 2012  . Hypertension 08/04/2012  . Renal disorder     kidney stones   . Polycystic kidney disease     ruled out  . Hepatic cyst      Outpatient Encounter Prescriptions as of 12/14/2014  Medication Sig  . amLODipine (NORVASC) 10 MG tablet Take 10 mg by mouth daily.  . ARIPiprazole (ABILIFY) 5 MG tablet Take 1 tablet (5 mg total) by mouth daily.  Marland Kitchen buPROPion (WELLBUTRIN XL) 150 MG 24 hr tablet Take 1 tablet (150 mg total) by mouth daily.  . clindamycin (CLEOCIN T) 1 % lotion Apply topically 2 (two) times daily.  . Naproxen Sod-Diphenhydramine (ALEVE PM) 220-25 MG TABS Take 3 capsules by mouth at bedtime as needed (sleep and pain).  . traZODone (DESYREL) 100 MG tablet Take 1 tablet (100 mg total) by mouth at bedtime as needed for sleep.  . promethazine (PHENERGAN) 25 MG tablet Take 1 tablet (25 mg total) by mouth every 6 (six) hours as needed for nausea.   No facility-administered encounter medications on file as of 12/14/2014.    Past Psychiatric History/Hospitalization(s): Anxiety: Yes Bipolar Disorder: Yes Depression:  Yes Mania: Yes Psychosis: Yes Schizophrenia: No Personality Disorder: No Hospitalization for psychiatric illness: Yes History of Electroconvulsive Shock Therapy: No Prior Suicide Attempts: Yes  Physical Exam: Constitutional:  BP 190/96 mmHg  Pulse 84  Ht 5\' 1"  (1.549 m)  Wt 186 lb 3.2 oz (84.46 kg)  BMI 35.20 kg/m2Pt reports she is off her BP meds and will call her PCP today regarding her BP.   General Appearance: alert, oriented, no acute distress  Musculoskeletal: Strength & Muscle Tone: within normal limits Gait & Station: normal, walking with cane due to pain Patient leans: N/A  Mental Status Examination/Evaluation: Objective: Attitude: Calm and cooperative  Appearance: Casual, appears to be stated age  Eye Contact::  Good  Speech:  Clear and Coherent and Normal Rate  Volume:  Normal  Mood:  euhtymic  Affect:  Congruent- brighter than at previous appts  Thought Process:  Goal Directed, Linear and Logical  Orientation:  Full (Time, Place, and Person)  Thought Content:  Negative  Suicidal Thoughts:  No  Homicidal Thoughts:  No  Judgement:  Fair  Insight:  Fair  Concentration: good  Memory: Immediate-fair Recent-fair Remote-fair  Recall: fair  Language: fair  Gait and Station: normal  [employer] of Knowledge: average  Psychomotor Activity:  Normal  Akathisia:  No  Handed:  Right  AIMS (if indicated): n/a  Assets:  Communication Skills 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  Degenerative disc disease     .  Asthma     .  Bipolar 1 disorder     .  Bipolar disorder     .  Fibromyalgia     .  IBS (irritable bowel syndrome)  Diagnosed November 2012    .  Hypertension  08/04/2012    .  Renal disorder       kidney stones    .  Polycystic kidney disease       ruled out    .  Hepatic cyst    AXIS IV  problems related to social environment and problems with access to health care services   AXIS V  51-60 moderate symptoms     Treatment Plan/Recommendations:  Plan of Care: Medication management with supportive therapy. Risks/benefits and SE of the medication discussed. Pt verbalized understanding and verbal consent obtained for treatment. Affirm with the patient that the medications are taken as ordered. Patient expressed understanding of how their medications were to be used.     Laboratory:  EKG 09/25/2014 QTc 464, pt is working with a cardiologist  Psychotherapy: Therapy: brief supportive therapy provided. Discussed psychosocial stressors in detail.   Medications:   ContinueTrazodone 100mg  po qHS for sleep and mood.   Abilify 5mg  po qHS for mood lability, anxiety. Pt would like to continue despite sexual side effects. Recommend she use lubrication when needed.  Wellbutrin XL 150mg  po qD for mood and sexual side effects.   SE of dry mouth recommend biotin and sugar free hard candies  Routine PRN Medications: No   Consultations: refer to new therapist per pt request  Safety Concerns: Pt denies SI and is at an acute low risk for suicide.Patient told to call clinic if any problems occur. Patient advised to go to ER if they should develop SI/HI, side effects, or if symptoms worsen. Has crisis numbers to call if needed. Pt verbalized understanding.    Other: F/up in 6 months or sooner if needed     Charlcie Cradle, MD 12/14/2014

## 2015-01-09 ENCOUNTER — Ambulatory Visit (HOSPITAL_COMMUNITY): Payer: Self-pay | Admitting: Clinical

## 2015-01-25 ENCOUNTER — Ambulatory Visit (HOSPITAL_COMMUNITY): Payer: Self-pay | Admitting: Clinical

## 2015-03-06 ENCOUNTER — Ambulatory Visit (INDEPENDENT_AMBULATORY_CARE_PROVIDER_SITE_OTHER): Payer: 59 | Admitting: Clinical

## 2015-03-06 ENCOUNTER — Encounter (HOSPITAL_COMMUNITY): Payer: Self-pay | Admitting: Clinical

## 2015-03-06 DIAGNOSIS — F3162 Bipolar disorder, current episode mixed, moderate: Secondary | ICD-10-CM

## 2015-03-06 DIAGNOSIS — F4310 Post-traumatic stress disorder, unspecified: Secondary | ICD-10-CM

## 2015-03-06 DIAGNOSIS — F1221 Cannabis dependence, in remission: Secondary | ICD-10-CM

## 2015-03-06 DIAGNOSIS — F1021 Alcohol dependence, in remission: Secondary | ICD-10-CM | POA: Diagnosis not present

## 2015-03-06 DIAGNOSIS — F1211 Cannabis abuse, in remission: Secondary | ICD-10-CM
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
## 2015-03-19 ENCOUNTER — Emergency Department (HOSPITAL_COMMUNITY): Payer: Medicare Other

## 2015-03-19 ENCOUNTER — Inpatient Hospital Stay (HOSPITAL_COMMUNITY)
Admission: EM | Admit: 2015-03-19 | Discharge: 2015-03-20 | DRG: 305 | Disposition: A | Discharge status: Home / Self Care | Payer: Medicare Other | Attending: Internal Medicine | Admitting: Internal Medicine

## 2015-03-19 ENCOUNTER — Encounter (HOSPITAL_COMMUNITY): Payer: Self-pay | Admitting: *Deleted

## 2015-03-19 ENCOUNTER — Encounter (HOSPITAL_COMMUNITY): Payer: Self-pay | Admitting: Vascular Surgery

## 2015-03-19 ENCOUNTER — Emergency Department (HOSPITAL_COMMUNITY)
Admission: EM | Admit: 2015-03-19 | Discharge: 2015-03-19 | Discharge status: Against Medical Advice | Payer: Medicare Other | Attending: Emergency Medicine | Admitting: Emergency Medicine

## 2015-03-19 DIAGNOSIS — R2681 Unsteadiness on feet: Secondary | ICD-10-CM | POA: Diagnosis present

## 2015-03-19 DIAGNOSIS — K589 Irritable bowel syndrome without diarrhea: Secondary | ICD-10-CM | POA: Diagnosis present

## 2015-03-19 DIAGNOSIS — I10 Essential (primary) hypertension: Secondary | ICD-10-CM | POA: Insufficient documentation

## 2015-03-19 DIAGNOSIS — G459 Transient cerebral ischemic attack, unspecified: Secondary | ICD-10-CM | POA: Diagnosis not present

## 2015-03-19 DIAGNOSIS — Z720 Tobacco use: Secondary | ICD-10-CM | POA: Diagnosis not present

## 2015-03-19 DIAGNOSIS — M797 Fibromyalgia: Secondary | ICD-10-CM | POA: Diagnosis present

## 2015-03-19 DIAGNOSIS — F17210 Nicotine dependence, cigarettes, uncomplicated: Secondary | ICD-10-CM | POA: Diagnosis present

## 2015-03-19 DIAGNOSIS — R51 Headache: Secondary | ICD-10-CM | POA: Diagnosis not present

## 2015-03-19 DIAGNOSIS — R270 Ataxia, unspecified: Secondary | ICD-10-CM

## 2015-03-19 DIAGNOSIS — F4310 Post-traumatic stress disorder, unspecified: Secondary | ICD-10-CM | POA: Diagnosis present

## 2015-03-19 DIAGNOSIS — Z818 Family history of other mental and behavioral disorders: Secondary | ICD-10-CM

## 2015-03-19 DIAGNOSIS — J45909 Unspecified asthma, uncomplicated: Secondary | ICD-10-CM | POA: Diagnosis present

## 2015-03-19 DIAGNOSIS — F319 Bipolar disorder, unspecified: Secondary | ICD-10-CM

## 2015-03-19 DIAGNOSIS — I160 Hypertensive urgency: Secondary | ICD-10-CM

## 2015-03-19 LAB — URINALYSIS, ROUTINE W REFLEX MICROSCOPIC
BILIRUBIN URINE: NEGATIVE
Glucose, UA: NEGATIVE mg/dL
Ketones, ur: NEGATIVE mg/dL
Leukocytes, UA: NEGATIVE
NITRITE: NEGATIVE
PROTEIN: NEGATIVE mg/dL
SPECIFIC GRAVITY, URINE: 1.025 (ref 1.005–1.030)
UROBILINOGEN UA: 0.2 mg/dL (ref 0.0–1.0)
pH: 5.5 (ref 5.0–8.0)

## 2015-03-19 LAB — CBC WITH DIFFERENTIAL/PLATELET
BASOS PCT: 1 % (ref 0–1)
Basophils Absolute: 0.1 10*3/uL (ref 0.0–0.1)
EOS ABS: 0.3 10*3/uL (ref 0.0–0.7)
EOS PCT: 2 % (ref 0–5)
HCT: 45.1 % (ref 36.0–46.0)
Hemoglobin: 15.4 g/dL — ABNORMAL HIGH (ref 12.0–15.0)
LYMPHS ABS: 4.4 10*3/uL — AB (ref 0.7–4.0)
Lymphocytes Relative: 36 % (ref 12–46)
MCH: 30.9 pg (ref 26.0–34.0)
MCHC: 34.1 g/dL (ref 30.0–36.0)
MCV: 90.4 fL (ref 78.0–100.0)
MONO ABS: 0.6 10*3/uL (ref 0.1–1.0)
MONOS PCT: 5 % (ref 3–12)
Neutro Abs: 6.8 10*3/uL (ref 1.7–7.7)
Neutrophils Relative %: 56 % (ref 43–77)
Platelets: 318 10*3/uL (ref 150–400)
RBC: 4.99 MIL/uL (ref 3.87–5.11)
RDW: 12.7 % (ref 11.5–15.5)
WBC: 12.2 10*3/uL — ABNORMAL HIGH (ref 4.0–10.5)

## 2015-03-19 LAB — TROPONIN I: Troponin I: <0.03 ng/mL (ref <0.031)

## 2015-03-19 LAB — COMPREHENSIVE METABOLIC PANEL
ALBUMIN: 4.2 g/dL (ref 3.5–5.0)
ALK PHOS: 76 U/L (ref 38–126)
ALT: 27 U/L (ref 14–54)
ANION GAP: 11 (ref 5–15)
AST: 26 U/L (ref 15–41)
BUN: 10 mg/dL (ref 6–20)
CALCIUM: 9.5 mg/dL (ref 8.9–10.3)
CO2: 24 mmol/L (ref 22–32)
Chloride: 105 mmol/L (ref 101–111)
Creatinine, Ser: 0.62 mg/dL (ref 0.44–1.00)
GFR calc non Af Amer: >60 mL/min (ref >60)
GLUCOSE: 109 mg/dL — AB (ref 65–99)
POTASSIUM: 3.4 mmol/L — AB (ref 3.5–5.1)
SODIUM: 140 mmol/L (ref 135–145)
TOTAL PROTEIN: 7.7 g/dL (ref 6.5–8.1)
Total Bilirubin: 0.7 mg/dL (ref 0.3–1.2)

## 2015-03-19 LAB — URINE MICROSCOPIC-ADD ON

## 2015-03-19 MED ORDER — ALBUTEROL SULFATE HFA 108 (90 BASE) MCG/ACT IN AERS
1.0000 | INHALATION_SPRAY | Freq: Four times a day (QID) | RESPIRATORY_TRACT | Status: DC | PRN
  Filled 2015-03-19: qty 6.7

## 2015-03-19 MED ORDER — BUPROPION HCL ER (XL) 150 MG PO TB24
150.0000 mg | ORAL_TABLET | Freq: Every day | ORAL | Status: DC
Start: 2015-03-20 — End: 2015-03-20
  Administered 2015-03-20: 150 mg via ORAL
  Filled 2015-03-19 (×2): qty 1

## 2015-03-19 MED ORDER — CLINDAMYCIN PHOSPHATE 1 % EX LOTN
1.0000 "application " | TOPICAL_LOTION | Freq: Every day | CUTANEOUS | Status: DC | PRN
  Filled 2015-03-19: qty 60

## 2015-03-19 MED ORDER — HYDRALAZINE HCL 20 MG/ML IJ SOLN
5.0000 mg | Freq: Once | INTRAMUSCULAR | Status: AC
  Administered 2015-03-19: 5 mg via INTRAVENOUS
  Filled 2015-03-19: qty 1

## 2015-03-19 MED ORDER — ONDANSETRON HCL 4 MG PO TABS: 4.0000 mg | ORAL_TABLET | Freq: Four times a day (QID) | ORAL | Status: DC | PRN

## 2015-03-19 MED ORDER — ENOXAPARIN SODIUM 40 MG/0.4ML ~~LOC~~ SOLN
40.0000 mg | SUBCUTANEOUS | Status: DC
  Administered 2015-03-19: 40 mg via SUBCUTANEOUS
  Filled 2015-03-19: qty 0.4

## 2015-03-19 MED ORDER — SODIUM CHLORIDE 0.9 % IJ SOLN
3.0000 mL | Freq: Two times a day (BID) | INTRAMUSCULAR | Status: DC
  Administered 2015-03-19: 3 mL via INTRAVENOUS

## 2015-03-19 MED ORDER — SODIUM CHLORIDE 0.9 % IJ SOLN
3.0000 mL | Freq: Two times a day (BID) | INTRAMUSCULAR | Status: DC
  Administered 2015-03-19 – 2015-03-20 (×2): 3 mL via INTRAVENOUS

## 2015-03-19 MED ORDER — TRAZODONE HCL 50 MG PO TABS: 100.0000 mg | ORAL_TABLET | Freq: Every evening | ORAL | Status: DC | PRN

## 2015-03-19 MED ORDER — ALBUTEROL SULFATE (2.5 MG/3ML) 0.083% IN NEBU: 3.0000 mL | INHALATION_SOLUTION | Freq: Four times a day (QID) | RESPIRATORY_TRACT | Status: DC | PRN

## 2015-03-19 MED ORDER — ONDANSETRON HCL 4 MG/2ML IJ SOLN: 4.0000 mg | Freq: Four times a day (QID) | INTRAMUSCULAR | Status: DC | PRN

## 2015-03-19 MED ORDER — ARIPIPRAZOLE 5 MG PO TABS
5.0000 mg | ORAL_TABLET | Freq: Every day | ORAL | Status: DC
  Administered 2015-03-20: 5 mg via ORAL
  Filled 2015-03-19 (×2): qty 1

## 2015-03-19 MED ORDER — AMLODIPINE BESYLATE 5 MG PO TABS
10.0000 mg | ORAL_TABLET | Freq: Every day | ORAL | Status: DC
  Administered 2015-03-20: 10 mg via ORAL
  Filled 2015-03-19: qty 2

## 2015-03-19 NOTE — ED Notes (Signed)
Report given to [place], Therapist, sports. Pt ready for transport.

## 2015-03-19 NOTE — ED Notes (Signed)
Pt c/o hypertension 1-2 weeks with  Increasing headache; pt called her PCP and was told to come here

## 2015-03-19 NOTE — ED Notes (Signed)
Pt states she attempted to stand and walk, was unable. Awaiting consult by Hospitalist.

## 2015-03-19 NOTE — H&P (Signed)
[facility] History and Physical  MADALEN Glover FXT:[id number] DOB: [birth year] DOA: 03/19/2015  Referring physician: ER PCP: Steva Colder, PA-C   Chief Complaint: Headache, ataxia.  HPI: [name] is a 49 y.o. female  This is a 49 year old lady who does have a history of hypertension and presents with a headache that she has had for the last week. She also describes fogginess and unsteadiness of gait today in particular. She has had 6-7 episodes of vomiting. She checked her blood pressure and it was over 299 systolic. When she came to the emergency room her systolic blood pressure was elevated, her recorded blood pressure was 172/105. She feels like she is falling when she is trying to walk. There is no speech difficulties. She does not have diplopia. There is no chest pain, palpitations, fever or dyspnea. There is no actual limb weakness. She is now being admitted for further investigation and management.   Review of Systems:  Apart from symptoms above, all systems negative.  Past Medical History  Diagnosis Date  . Degenerative disc disease   . Asthma   . Bipolar 1 disorder   . Bipolar disorder   . Fibromyalgia   . IBS (irritable bowel syndrome) Diagnosed November 2012  . Hypertension 08/04/2012  . Renal disorder     kidney stones   . Polycystic kidney disease     ruled out  . Hepatic cyst    Past Surgical History  Procedure Laterality Date  . Shoulder surgery      left  . Abdominal hysterectomy    . Abdominal surgery    . Cesarean section    . Cholecystectomy    . Appendectomy    . Upper gastrointestinal endoscopy  November 2012  . Colonoscopy  November 2012  . Melanoma removed  January 2013    removed from back  . Cesarean section    . Carpal tunnel release    . Hepatic cyst removal     Social History:  reports that she has been smoking Cigarettes.  She has a 20 pack-year smoking history. She has never used smokeless tobacco. She reports that she  does not drink alcohol or use illicit drugs.  Allergies  Allergen Reactions  . Barium-Containing Compounds Other (See Comments)    Stomach cramps, extreme diarrhea, and vomiting  . Bee Venom Anaphylaxis  . Lithium Other (See Comments)    Agitation and hostility  . Sulfa Antibiotics Anaphylaxis  . Seldane [Terfenadine] Nausea And Vomiting and Other (See Comments)    Causes first layer of skin to peel  . Tetracyclines & Related Hives and Nausea And Vomiting  . Aspirin Rash and Other (See Comments)    Upset stomach  . Erythromycin Nausea And Vomiting, Rash and Other (See Comments)    Cramps    Family History  Problem Relation Age of Onset  . Adopted: Yes  . Bipolar disorder Mother     never diagnosed but patient suspects mother had bipolar disorder.Marland KitchenMarland KitchenMarland KitchenMarland Kitchenpt was adopeted, only knew birth mother for a short period of time.  Marland Kitchen Anxiety disorder Mother   . Suicidality Mother   . ADD / ADHD Neg Hx   . Alcohol abuse Neg Hx   . Drug abuse Neg Hx   . Dementia Neg Hx   . Depression Neg Hx   . OCD Neg Hx   . Paranoid behavior Neg Hx   . Schizophrenia Neg Hx   . Seizures Neg Hx   . Sexual abuse Neg  Hx   . Physical abuse Neg Hx   . Turner syndrome Daughter   . Bipolar disorder Daughter     Prior to Admission medications   Medication Sig Start Date End Date Taking? Authorizing Provider  albuterol (PROVENTIL HFA;VENTOLIN HFA) 108 (90 BASE) MCG/ACT inhaler Inhale 1-2 puffs into the lungs every 6 (six) hours as needed for wheezing or shortness of breath.   Yes Historical Provider, MD  amLODipine (NORVASC) 10 MG tablet Take 10 mg by mouth daily.   Yes Historical Provider, MD  ARIPiprazole (ABILIFY) 5 MG tablet Take 1 tablet (5 mg total) by mouth daily. 12/14/14 12/14/15 Yes Charlcie Cradle, MD  buPROPion (WELLBUTRIN XL) 150 MG 24 hr tablet Take 1 tablet (150 mg total) by mouth daily. 12/14/14  Yes Charlcie Cradle, MD  clindamycin (CLEOCIN T) 1 % lotion Apply 1 application topically daily as needed  (for rash).    Yes Historical Provider, MD  Naproxen Sod-Diphenhydramine (ALEVE PM) 220-25 MG TABS Take 2 capsules by mouth at bedtime as needed (sleep and pain).    Yes Historical Provider, MD  traZODone (DESYREL) 100 MG tablet Take 1 tablet (100 mg total) by mouth at bedtime as needed for sleep. Patient taking differently: Take 50-100 mg by mouth at bedtime as needed for sleep.  12/14/14  Yes Charlcie Cradle, MD   Physical Exam: Filed Vitals:   03/19/15 2000 03/19/15 2130 03/19/15 2135 03/19/15 2200  BP: 200/108 171/103 171/103 138/80  Pulse: 78 78 88 78  Temp:      TempSrc:      Resp: 14 13 20 20   Height:      Weight:      SpO2: 96% 98% 98% 97%    Wt Readings from Last 3 Encounters:  03/19/15 83.008 kg (183 lb)  12/14/14 84.46 kg (186 lb 3.2 oz)  09/05/14 84.913 kg (187 lb 3.2 oz)    General:  Appears calm and comfortable. She is alert and orientated at the present time. Her blood pressure has improved with one dose of intravenous hydralazine. Eyes: PERRL, normal lids, irises & conjunctiva ENT: grossly normal hearing, lips & tongue Neck: no LAD, masses or thyromegaly Cardiovascular: RRR, no m/r/g. No LE edema. Telemetry: SR, no arrhythmias  Respiratory: CTA bilaterally, no w/r/r. Normal respiratory effort. Abdomen: soft, ntnd Skin: no rash or induration seen on limited exam Musculoskeletal: grossly normal tone BUE/BLE Psychiatric: grossly normal mood and affect, speech fluent and appropriate Neurologic: grossly non-focal. no obvious cerebellar signs.           Labs on Admission:  Basic Metabolic Panel:  Recent Labs Lab 03/19/15 2005  NA 140  K 3.4*  CL 105  CO2 24  GLUCOSE 109*  BUN 10  CREATININE 0.62  CALCIUM 9.5   Liver Function Tests:  Recent Labs Lab 03/19/15 2005  AST 26  ALT 27  ALKPHOS 76  BILITOT 0.7  PROT 7.7  ALBUMIN 4.2   No results for input(s): LIPASE, AMYLASE in the last 168 hours. No results for input(s): AMMONIA in the last 168  hours. CBC:  Recent Labs Lab 03/19/15 2005  WBC 12.2*  NEUTROABS 6.8  HGB 15.4*  HCT 45.1  MCV 90.4  PLT 318   Cardiac Enzymes:  Recent Labs Lab 03/19/15 2005  TROPONINI <0.03    BNP (last 3 results) No results for input(s): BNP in the last 8760 hours.  ProBNP (last 3 results) No results for input(s): PROBNP in the last 8760 hours.  CBG: No results  for input(s): GLUCAP in the last 168 hours.  Radiological Exams on Admission: Ct Head Wo Contrast  03/19/2015   CLINICAL DATA:  Frontal headache and dizziness  EXAM: CT HEAD WITHOUT CONTRAST  TECHNIQUE: Contiguous axial images were obtained from the base of the skull through the vertex without intravenous contrast.  COMPARISON:  02/17/2013  FINDINGS: No acute cortical infarct, hemorrhage, or mass lesion ispresent. Ventricles are of normal size. No significant extra-axial fluid collection is present. The paranasal sinuses andmastoid air cells are clear. The osseous skull is intact.  IMPRESSION: 1. No acute intracranial abnormalities.  Normal brain.   Electronically Signed   By: Kerby Moors M.D.   On: 03/19/2015 20:55    EKG: Independently reviewed. Sinus rhythm. Left bundle branch block. This has been seen before.  Assessment/Plan   1. Hypertensive urgency. Her blood pressure has improved with intravenous hydralazine one dose. We will continue to monitor closely and give her usual amlodipine when scheduled. Echocardiogram will be ordered. 2. Ataxic gait. MRI brain scan has been ordered. I will request neurology consultation. 3. Bipolar disorder. 4. PTSD.  Further recommendations will depend on patient's hospital progress.   Code Status: Full code.   DVT Prophylaxis: Lovenox.  Family Communication: I discussed the plan with the patient at the bedside.   Disposition Plan: Home when medically stable  Time spent: 45 minutes.  [name] [facility] Pager [phone].

## 2015-03-19 NOTE — ED Notes (Signed)
Refused to be ambulated. States "she does not think that it would not be a good idea."

## 2015-03-19 NOTE — ED Provider Notes (Signed)
CSN: [id number]     Arrival date & time 03/19/15  1926 History   First MD Initiated Contact with Patient 03/19/15 1933     Chief Complaint  Patient presents with  . Hypertension     (Consider location/radiation/quality/duration/timing/severity/associated sxs/prior Treatment) HPI Comments: Patient states she's had a headache and high blood pressure for the past one week. She has a history of hypertension and takes Norvasc and states compliance. This morning she woke up feeling dizzy with room spinning and inability to walk straight. She had 6-7 episodes of vomiting. She checked her blood pressure was over 200. Headache is made worse by light and noise. She denies any focal weakness, numbness or tingling. She denies any difficulty speaking or swallowing. She denies any chest pain or shortness of breath. She feels like she is falling over when she is trying to walk. Her last blood pressure is 172/105. She last took her Norvasc this morning. Denies any fever.  The history is provided by the patient. The history is limited by the condition of the patient.    Past Medical History  Diagnosis Date  . Degenerative disc disease   . Asthma   . Bipolar 1 disorder   . Bipolar disorder   . Fibromyalgia   . IBS (irritable bowel syndrome) Diagnosed November 2012  . Hypertension 08/04/2012  . Renal disorder     kidney stones   . Polycystic kidney disease     ruled out  . Hepatic cyst    Past Surgical History  Procedure Laterality Date  . Shoulder surgery      left  . Abdominal hysterectomy    . Abdominal surgery    . Cesarean section    . Cholecystectomy    . Appendectomy    . Upper gastrointestinal endoscopy  November 2012  . Colonoscopy  November 2012  . Melanoma removed  January 2013    removed from back  . Cesarean section    . Carpal tunnel release    . Hepatic cyst removal     Family History  Problem Relation Age of Onset  . Adopted: Yes  . Bipolar disorder Mother     never  diagnosed but patient suspects mother had bipolar disorder.Marland KitchenMarland KitchenMarland KitchenMarland Kitchenpt was adopeted, only knew birth mother for a short period of time.  Marland Kitchen Anxiety disorder Mother   . Suicidality Mother   . ADD / ADHD Neg Hx   . Alcohol abuse Neg Hx   . Drug abuse Neg Hx   . Dementia Neg Hx   . Depression Neg Hx   . OCD Neg Hx   . Paranoid behavior Neg Hx   . Schizophrenia Neg Hx   . Seizures Neg Hx   . Sexual abuse Neg Hx   . Physical abuse Neg Hx   . Turner syndrome Daughter   . Bipolar disorder Daughter    Social History  Substance Use Topics  . Smoking status: Current Every Day Smoker -- 0.50 packs/day for 40 years    Types: Cigarettes  . Smokeless tobacco: Never Used  . Alcohol Use: No   OB History    No data available     Review of Systems  Constitutional: Negative for fever, activity change and appetite change.  HENT: Negative for congestion and rhinorrhea.   Eyes: Positive for photophobia and visual disturbance.  Respiratory: Negative for cough, chest tightness and shortness of breath.   Cardiovascular: Negative for chest pain.  Gastrointestinal: Positive for nausea and vomiting. Negative for abdominal  pain.  Genitourinary: Negative for dysuria and hematuria.  Musculoskeletal: Negative for myalgias and arthralgias.  Neurological: Positive for dizziness and headaches. Negative for seizures and weakness.  A complete 10 system review of systems was obtained and all systems are negative except as noted in the HPI and PMH.      Allergies  Barium-containing compounds; Bee venom; Lithium; Sulfa antibiotics; Seldane; Tetracyclines & related; Aspirin; and Erythromycin  Home Medications   Prior to Admission medications   Medication Sig Start Date End Date Taking? Authorizing Provider  albuterol (PROVENTIL HFA;VENTOLIN HFA) 108 (90 BASE) MCG/ACT inhaler Inhale 1-2 puffs into the lungs every 6 (six) hours as needed for wheezing or shortness of breath.   Yes Historical Provider, MD   amLODipine (NORVASC) 10 MG tablet Take 10 mg by mouth daily.   Yes Historical Provider, MD  ARIPiprazole (ABILIFY) 5 MG tablet Take 1 tablet (5 mg total) by mouth daily. 12/14/14 12/14/15 Yes Charlcie Cradle, MD  buPROPion (WELLBUTRIN XL) 150 MG 24 hr tablet Take 1 tablet (150 mg total) by mouth daily. 12/14/14  Yes Charlcie Cradle, MD  clindamycin (CLEOCIN T) 1 % lotion Apply 1 application topically daily as needed (for rash).    Yes Historical Provider, MD  Naproxen Sod-Diphenhydramine (ALEVE PM) 220-25 MG TABS Take 2 capsules by mouth at bedtime as needed (sleep and pain).    Yes Historical Provider, MD  traZODone (DESYREL) 100 MG tablet Take 1 tablet (100 mg total) by mouth at bedtime as needed for sleep. Patient taking differently: Take 50-100 mg by mouth at bedtime as needed for sleep.  12/14/14  Yes Charlcie Cradle, MD   BP 181/74 mmHg  Pulse 72  Temp(Src) 98.7 F (37.1 C) (Oral)  Resp 20  Ht 5\' 2"  (1.575 m)  Wt 184 lb 1.6 oz (83.507 kg)  BMI 33.66 kg/m2  SpO2 97% Physical Exam  Constitutional: She is oriented to person, place, and time. She appears well-developed and well-nourished. No distress.  HENT:  Head: Normocephalic and atraumatic.  Mouth/Throat: Oropharynx is clear and moist. No oropharyngeal exudate.  Eyes: Conjunctivae and EOM are normal. Pupils are equal, round, and reactive to light.  Neck: Normal range of motion. Neck supple.  No meningismus.  Cardiovascular: Normal rate, regular rhythm, normal heart sounds and intact distal pulses.   No murmur heard. Pulmonary/Chest: Effort normal and breath sounds normal. No respiratory distress.  Abdominal: Soft. There is no tenderness. There is no rebound and no guarding.  Musculoskeletal: Normal range of motion. She exhibits no edema or tenderness.  Neurological: She is alert and oriented to person, place, and time. No cranial nerve deficit. She exhibits normal muscle tone. Coordination normal.  No ataxia on finger to nose  bilaterally. No pronator drift. 5/5 strength throughout. CN 2-12 intact. positive Romberg. Equal grip strength. Sensation intact. Gait is very ataxic  Skin: Skin is warm.  Psychiatric: She has a normal mood and affect. Her behavior is normal.  Nursing note and vitals reviewed.   ED Course  Procedures (including critical care time) Labs Review Labs Reviewed  CBC WITH DIFFERENTIAL/PLATELET - Abnormal; Notable for the following:    WBC 12.2 (*)    Hemoglobin 15.4 (*)    Lymphs Abs 4.4 (*)    All other components within normal limits  COMPREHENSIVE METABOLIC PANEL - Abnormal; Notable for the following:    Potassium 3.4 (*)    Glucose, Bld 109 (*)    All other components within normal limits  URINALYSIS, ROUTINE W REFLEX  MICROSCOPIC (NOT AT [facility]) - Abnormal; Notable for the following:    Hgb urine dipstick TRACE (*)    All other components within normal limits  URINE MICROSCOPIC-ADD ON - Abnormal; Notable for the following:    Squamous Epithelial / LPF FEW (*)    All other components within normal limits  TROPONIN I  COMPREHENSIVE METABOLIC PANEL  CBC    Imaging Review Ct Head Wo Contrast  03/19/2015   CLINICAL DATA:  Frontal headache and dizziness  EXAM: CT HEAD WITHOUT CONTRAST  TECHNIQUE: Contiguous axial images were obtained from the base of the skull through the vertex without intravenous contrast.  COMPARISON:  02/17/2013  FINDINGS: No acute cortical infarct, hemorrhage, or mass lesion ispresent. Ventricles are of normal size. No significant extra-axial fluid collection is present. The paranasal sinuses andmastoid air cells are clear. The osseous skull is intact.  IMPRESSION: 1. No acute intracranial abnormalities.  Normal brain.   Electronically Signed   By: Kerby Moors M.D.   On: 03/19/2015 20:55   I have personally reviewed and evaluated these images and lab results as part of my medical decision-making.   EKG Interpretation   Date/Time:  Monday March 19 2015 20:04:48  EDT Ventricular Rate:  78 PR Interval:  172 QRS Duration: 127 QT Interval:  439 QTC Calculation: 500 R Axis:   -83 Text Interpretation:  Sinus rhythm Left bundle branch block No significant  change was found Confirmed by Wyvonnia Dusky  MD, Kilea Mccarey [phone]) on 03/19/2015  8:06:55 PM      MDM   Final diagnoses:  Hypertensive urgency  Ataxia   Headache and elevated BP for the past week.  Today with dizziness, unsteady gait and vomiting.  +Romberg and ataxic gait.  BP 170/100. EKG unchanged.  LSN yesterday night.  CT head negative.  BP improved with hydralazine in the ED. Still ataxic and falling over with attempted ambulation. MRI not available tonight.  Plan admission for hypertensive urgency, r/o stroke D/w Dr. Anastasio Champion.   Ezequiel Essex, MD 03/19/15 [phone]

## 2015-03-19 NOTE — ED Notes (Signed)
Pt reports to the ED for eval of hypertension and HA. She reports she has had these symptoms began 1-2 weeks ago. She had attributed her HTN to her shoulder pain. She reports she called her PCP and was told to come here. She is compliant with her home BP medications. Pt reports light and sound make the HA worse. She is 172/105 at this time. Grips equal, smile symmetric, and no arm drifty noted. Pt A&Ox4, resp e/u, and skin warm and dry.

## 2015-03-19 NOTE — ED Notes (Signed)
Pt's BP decreased, states she is feeling better.

## 2015-03-20 ENCOUNTER — Inpatient Hospital Stay (HOSPITAL_COMMUNITY): Payer: Medicare Other

## 2015-03-20 DIAGNOSIS — I10 Essential (primary) hypertension: Principal | ICD-10-CM

## 2015-03-20 DIAGNOSIS — G459 Transient cerebral ischemic attack, unspecified: Secondary | ICD-10-CM

## 2015-03-20 LAB — CBC
HEMATOCRIT: 44.7 % (ref 36.0–46.0)
Hemoglobin: 14.5 g/dL (ref 12.0–15.0)
MCH: 29.6 pg (ref 26.0–34.0)
MCHC: 32.4 g/dL (ref 30.0–36.0)
MCV: 91.2 fL (ref 78.0–100.0)
PLATELETS: 288 10*3/uL (ref 150–400)
RBC: 4.9 MIL/uL (ref 3.87–5.11)
RDW: 12.7 % (ref 11.5–15.5)
WBC: 9.6 10*3/uL (ref 4.0–10.5)

## 2015-03-20 LAB — COMPREHENSIVE METABOLIC PANEL
ALBUMIN: 3.8 g/dL (ref 3.5–5.0)
ALK PHOS: 66 U/L (ref 38–126)
ALT: 24 U/L (ref 14–54)
AST: 22 U/L (ref 15–41)
Anion gap: 9 (ref 5–15)
BILIRUBIN TOTAL: 0.9 mg/dL (ref 0.3–1.2)
BUN: 8 mg/dL (ref 6–20)
CALCIUM: 8.8 mg/dL — AB (ref 8.9–10.3)
CO2: 26 mmol/L (ref 22–32)
CREATININE: 0.63 mg/dL (ref 0.44–1.00)
Chloride: 102 mmol/L (ref 101–111)
GFR calc Af Amer: >60 mL/min (ref >60)
GLUCOSE: 122 mg/dL — AB (ref 65–99)
POTASSIUM: 3.8 mmol/L (ref 3.5–5.1)
Sodium: 137 mmol/L (ref 135–145)
TOTAL PROTEIN: 7.2 g/dL (ref 6.5–8.1)

## 2015-03-20 MED ORDER — ACETAMINOPHEN 325 MG PO TABS
650.0000 mg | ORAL_TABLET | Freq: Four times a day (QID) | ORAL | Status: DC | PRN
  Administered 2015-03-20: 650 mg via ORAL
  Filled 2015-03-20: qty 2

## 2015-03-20 NOTE — Care Management Note (Signed)
Case Management Note  Patient Details  Name: [name] MRN: [id number] Date of Birth: Feb 23, 1966  Expected Discharge Date:  03/20/15               Expected Discharge Plan:  Home/Self Care  In-House Referral:  NA  Discharge planning Services  CM Consult  Post Acute Care Choice:  NA Choice offered to:  NA  DME Arranged:    DME Agency:     HH Arranged:    [place] Agency:     Status of Service:  Completed, signed off  Medicare Important Message Given:    Date Medicare IM Given:    Medicare IM give by:    Date Additional Medicare IM Given:    Additional Medicare Important Message give by:     If discussed at [place] of Stay Meetings, dates discussed:    Additional Comments: Pt is from home, lives with her boyfriend and is independent at baseline. Pt uses a cane with ambulation but has no other DME needs or HH services prior to admission. Pt plans to discharge home with self care today. No CM needs.  Sherald Barge, RN 03/20/2015, 1:57 PM

## 2015-03-20 NOTE — Progress Notes (Signed)
Patient discharged home today.  Patient was given discharge instructions and care notes.  Patient verbalized understanding with no complaints or concerns voiced at this time.  Patient's heart monitor was removed and central tele was notified of patient's discharge.  IV was removed with catheter intact, no bleeding or complications.  Patient left unit in stable condition by a staff member in a wheelchair.

## 2015-03-20 NOTE — Discharge Summary (Signed)
Physician Discharge Summary  [name] QQP:[id number] DOB: 04/01/1966 DOA: 03/19/2015  PCP: Steva Colder, PA-C  Admit date: 03/19/2015 Discharge date: 03/20/2015  Time spent: 45 minutes  Recommendations for Outpatient Follow-up:  -Will be discharged home today. -Advised to follow up with PCP in 2 weeks.   Discharge Diagnoses:  Active Problems:   Bipolar 1 disorder   PTSD (post-traumatic stress disorder)   Hypertensive urgency   Unsteady gait   Discharge Condition: Stable and improved  Filed Weights   03/19/15 1927 03/19/15 2255  Weight: 83.008 kg (183 lb) 83.507 kg (184 lb 1.6 oz)    History of present illness:  This is a 49 year old lady who does have a history of hypertension and presents with a headache that she has had for the last week. She also describes fogginess and unsteadiness of gait today in particular. She has had 6-7 episodes of vomiting. She checked her blood pressure and it was over 712 systolic. When she came to the emergency room her systolic blood pressure was elevated, her recorded blood pressure was 172/105. She feels like she is falling when she is trying to walk. There is no speech difficulties. She does not have diplopia. There is no chest pain, palpitations, fever or dyspnea. There is no actual limb weakness. She is now being admitted for further investigation and management.   Hospital Course:   Hypertensive Urgency -Resolved with only 1 dose of IV hydralazine in ED. -Continue home norvasc 10 mg daily. -BP 130/70 on DC. -MRI Brain negative for acute ischemia. Suspect dizziness and imbalance due to high BP.  Bipolar Disorder/PTSD -Continue psychotropic meds.   Procedures:  None   Consultations:  None  Discharge Instructions  Discharge Instructions    Diet - low sodium heart healthy    Complete by:  As directed      Increase activity slowly    Complete by:  As directed             Medication List    STOP taking these  medications        ALEVE PM 220-25 MG Tabs  Generic drug:  Naproxen Sod-Diphenhydramine      TAKE these medications        albuterol 108 (90 BASE) MCG/ACT inhaler  Commonly known as:  PROVENTIL HFA;VENTOLIN HFA  Inhale 1-2 puffs into the lungs every 6 (six) hours as needed for wheezing or shortness of breath.     amLODipine 10 MG tablet  Commonly known as:  NORVASC  Take 10 mg by mouth daily.     ARIPiprazole 5 MG tablet  Commonly known as:  ABILIFY  Take 1 tablet (5 mg total) by mouth daily.     buPROPion 150 MG 24 hr tablet  Commonly known as:  WELLBUTRIN XL  Take 1 tablet (150 mg total) by mouth daily.     clindamycin 1 % lotion  Commonly known as:  CLEOCIN T  Apply 1 application topically daily as needed (for rash).     traZODone 100 MG tablet  Commonly known as:  DESYREL  Take 1 tablet (100 mg total) by mouth at bedtime as needed for sleep.       Allergies  Allergen Reactions  . Barium-Containing Compounds Other (See Comments)    Stomach cramps, extreme diarrhea, and vomiting  . Tammy Venom Anaphylaxis  . Lithium Other (See Comments)    Agitation and hostility  . Sulfa Antibiotics Anaphylaxis  . Seldane [Terfenadine] Nausea And Vomiting  and Other (See Comments)    Causes first layer of skin to peel  . Tetracyclines & Related Hives and Nausea And Vomiting  . Aspirin Rash and Other (See Comments)    Upset stomach  . Erythromycin Nausea And Vomiting, Rash and Other (See Comments)    Cramps       Follow-up Information    Follow up with Steva Colder, PA-C. Schedule an appointment as soon as possible for a visit in 2 weeks.   Specialty:  Pulmonary Disease   Contact information:   [address] [place] [address] [phone]        The results of significant diagnostics from this hospitalization (including imaging, microbiology, ancillary and laboratory) are listed below for reference.    Significant Diagnostic Studies: Ct Head Wo  Contrast  03/19/2015   CLINICAL DATA:  Frontal headache and dizziness  EXAM: CT HEAD WITHOUT CONTRAST  TECHNIQUE: Contiguous axial images were obtained from the base of the skull through the vertex without intravenous contrast.  COMPARISON:  02/17/2013  FINDINGS: No acute cortical infarct, hemorrhage, or mass lesion ispresent. Ventricles are of normal size. No significant extra-axial fluid collection is present. The paranasal sinuses andmastoid air cells are clear. The osseous skull is intact.  IMPRESSION: 1. No acute intracranial abnormalities.  Normal brain.   Electronically Signed   By: Kerby Moors M.D.   On: 03/19/2015 20:55   Mr Brain Wo Contrast  03/20/2015   CLINICAL DATA:  Vertigo and hypertension.  EXAM: MRI HEAD WITHOUT CONTRAST  TECHNIQUE: Multiplanar, multiecho pulse sequences of the brain and surrounding structures were obtained without intravenous contrast.  COMPARISON:  None.  FINDINGS: Calvarium and upper cervical spine: No focal marrow signal abnormality.  Orbits: No significant findings.  Sinuses and Mastoids: Clear. Mastoid and middle ears are clear. Symmetric and normal labyrinthine signal.  Brain: No acute or remote infarct, hemorrhage, hydrocephalus, or mass lesion. No evidence of large vessel occlusion.  Mild indistinct T2 hyperintensity in the pons bilaterally with few patchy FLAIR hyperintensities in the bilateral cerebral white matter. Although nonspecific, this is likely early small vessel ischemic disease in this patient with history of hypertension and smoking.  IMPRESSION: 1. No acute finding, including infarct. 2. Changes of mild chronic small vessel ischemia.   Electronically Signed   By: Monte Fantasia M.D.   On: 03/20/2015 10:19    Microbiology: No results found for this or any previous visit (from the past 240 hour(s)).   Labs: Basic Metabolic Panel:  Recent Labs Lab 03/19/15 2005 03/20/15 0602  NA 140 137  K 3.4* 3.8  CL 105 102  CO2 24 26  GLUCOSE 109*  122*  BUN 10 8  CREATININE 0.62 0.63  CALCIUM 9.5 8.8*   Liver Function Tests:  Recent Labs Lab 03/19/15 2005 03/20/15 0602  AST 26 22  ALT 27 24  ALKPHOS 76 66  BILITOT 0.7 0.9  PROT 7.7 7.2  ALBUMIN 4.2 3.8   No results for input(s): LIPASE, AMYLASE in the last 168 hours. No results for input(s): AMMONIA in the last 168 hours. CBC:  Recent Labs Lab 03/19/15 2005 03/20/15 0602  WBC 12.2* 9.6  NEUTROABS 6.8  --   HGB 15.4* 14.5  HCT 45.1 44.7  MCV 90.4 91.2  PLT 318 288   Cardiac Enzymes:  Recent Labs Lab 03/19/15 2005  TROPONINI <0.03   BNP: BNP (last 3 results) No results for input(s): BNP in the last 8760 hours.  ProBNP (last 3 results) No results for input(s): PROBNP in the last 8760 hours.  CBG: No results for input(s): GLUCAP in the last 168 hours.     SignedLelon Frohlich  [facility] Pager: [phone] 03/20/2015, 12:47 PM

## 2015-04-09 ENCOUNTER — Ambulatory Visit (HOSPITAL_COMMUNITY): Payer: Self-pay | Admitting: Clinical

## 2015-04-16 ENCOUNTER — Ambulatory Visit (INDEPENDENT_AMBULATORY_CARE_PROVIDER_SITE_OTHER): Payer: 59 | Admitting: Clinical

## 2015-04-16 ENCOUNTER — Encounter (HOSPITAL_COMMUNITY): Payer: Self-pay | Admitting: Clinical

## 2015-04-16 DIAGNOSIS — F1021 Alcohol dependence, in remission: Secondary | ICD-10-CM

## 2015-04-16 DIAGNOSIS — F4310 Post-traumatic stress disorder, unspecified: Secondary | ICD-10-CM

## 2015-04-16 DIAGNOSIS — F1210 Cannabis abuse, uncomplicated: Secondary | ICD-10-CM | POA: Diagnosis not present

## 2015-04-16 DIAGNOSIS — F3162 Bipolar disorder, current episode mixed, moderate: Secondary | ICD-10-CM | POA: Diagnosis not present

## 2015-04-16 DIAGNOSIS — F1211 Cannabis abuse, in remission: Secondary | ICD-10-CM

## 2015-04-16 NOTE — Progress Notes (Signed)
   THERAPIST PROGRESS NOTE  Session Time: 9:05 -10:00  Participation Level: Active  Behavioral Response: CasualAlertDepressed  Type of Therapy: Individual Therapy  Treatment Goals addressed: improve psychiatric symptoms  Interventions: Motivational Interviewing grounding and mindfulness techniques  Summary: [name] is a 49 y.o. female who presents with Bipolar 1 disorder, mixed, moderate, PTSD, Alcohol Dependence in remission, Marijuana Dependence in remission.   Suicidal/Homicidal: Nowithout intent/plan  Therapist Response: [name] met with clinician for an individual session. [name] discussed her current life events, and her goals for therapy. [name] shared that she had been doing "okay" She shared that she went to the emergency room recently because she was having difficulty with her blood pressure. She shared about the event and how it made her feel. [name] shared what she would like to work on in therapy. Client and clinician discussed what therapy would be like. Clinician introduced grounding and mindfulness techniques. Clinician explained the process, purpose and practice of the techniques. [name] and clincian discussed some of the techniques in detail and then practiced them together. [name] agreed to practice the techniques daily until next session. Clinician introduced some basic cbt concepts. [name] agreed to work on a cbt packet for homework.   Plan: Return again in 1-2 weeks.  Diagnosis: Axis I: Bipolar 1 disorder, mixed, moderate, PTSD, Alcohol Dependence in remission, Marijuana Dependence in remission.     Powell,Frances A, LCSW 04/16/2015  

## 2015-04-23 ENCOUNTER — Ambulatory Visit (INDEPENDENT_AMBULATORY_CARE_PROVIDER_SITE_OTHER): Payer: 59 | Admitting: Clinical

## 2015-04-23 ENCOUNTER — Encounter (HOSPITAL_COMMUNITY): Payer: Self-pay | Admitting: Clinical

## 2015-04-23 DIAGNOSIS — F4310 Post-traumatic stress disorder, unspecified: Secondary | ICD-10-CM

## 2015-04-23 DIAGNOSIS — F3162 Bipolar disorder, current episode mixed, moderate: Secondary | ICD-10-CM

## 2015-04-23 NOTE — Progress Notes (Signed)
   THERAPIST PROGRESS NOTE  Session Time: 9:03 - 9:58  Participation Level: Active  Behavioral Response: CasualAlertDepressed  Type of Therapy: Individual Therapy  Treatment Goals addressed: improve psychiatric symptoms, emotional regulation, Learn about diagnosis  Interventions: Motivational Interviewing grounding and mindfulness techniques   Summary: Summary: [name] is a 49 y.o. female who presents with Bipolar 1 disorder, mixed, moderate, PTSD, Alcohol Dependence in remission, Marijuana Dependence in remission.  Suicidal/Homicidal: Nowithout intent/plan  Therapist Response:  Katharine Look met with clinician for an individual session. [name] discussed her psychiatric symptoms, her current life events, and her homework. [name] shared that she had been practicing her grounding and mindfulness techniques. She shared that she found them to be helpful in interrupting thoughts and calming herself. [name] had completed her packet on bi-polar. She shared that some of the information she was aware of while other parts she was not. Client and clinician discussed her thoughts and insights. She shared about the things she identified with. Client and clinician discussed some areas in more detail. [name] shared that her beloved Tollie Eth had died. She cried. [name] shared that it made her feel sad and depressed. Client and clincian discussed grief and things she could do to process her grief. She shared that her husband was very supportive of her. Client and clinician practiced a mew (to her) grounding technique together. Client and clinician discussed that it was okay to grieve and it was also okay to regulate her emotions. She stated that she understood the idea of balance. She shared she felt better having the support and understanding.  Plan: Return again in 1- 2 weeks.  Diagnosis: Axis I: Bipolar 1 disorder, mixed, moderate, PTSD, Alcohol Dependence in remission, Marijuana Dependence in  remission       Powell,Frances A, LCSW 04/23/2015

## 2015-04-30 ENCOUNTER — Ambulatory Visit (HOSPITAL_COMMUNITY): Payer: Self-pay | Admitting: Clinical

## 2015-05-10 ENCOUNTER — Ambulatory Visit (HOSPITAL_COMMUNITY): Payer: Self-pay | Admitting: Clinical

## 2015-06-14 ENCOUNTER — Encounter (HOSPITAL_COMMUNITY): Payer: Self-pay | Admitting: Psychiatry

## 2015-06-14 ENCOUNTER — Ambulatory Visit (INDEPENDENT_AMBULATORY_CARE_PROVIDER_SITE_OTHER): Payer: 59 | Admitting: Psychiatry

## 2015-06-14 VITALS — BP 152/86 | HR 81 | Ht 62.0 in | Wt 185.2 lb

## 2015-06-14 DIAGNOSIS — F4310 Post-traumatic stress disorder, unspecified: Secondary | ICD-10-CM | POA: Diagnosis not present

## 2015-06-14 DIAGNOSIS — F410 Panic disorder [episodic paroxysmal anxiety] without agoraphobia: Secondary | ICD-10-CM

## 2015-06-14 DIAGNOSIS — F3130 Bipolar disorder, current episode depressed, mild or moderate severity, unspecified: Secondary | ICD-10-CM

## 2015-06-14 DIAGNOSIS — F411 Generalized anxiety disorder: Secondary | ICD-10-CM

## 2015-06-14 DIAGNOSIS — F4000 Agoraphobia, unspecified: Secondary | ICD-10-CM

## 2015-06-14 MED ORDER — BUPROPION HCL ER (XL) 300 MG PO TB24: 300.0000 mg | ORAL_TABLET | Freq: Every day | ORAL | Status: DC

## 2015-06-14 MED ORDER — TRAZODONE HCL 100 MG PO TABS: 200.0000 mg | ORAL_TABLET | Freq: Every evening | ORAL | Status: DC | PRN

## 2015-06-14 MED ORDER — ARIPIPRAZOLE 5 MG PO TABS: 5.0000 mg | ORAL_TABLET | Freq: Every day | ORAL | Status: DC

## 2015-06-14 NOTE — Progress Notes (Signed)
Patient ID: [name], female   DOB: 10-27-1965, 49 y.o.   MRN: [id number]  [place] [id number] Progress Note  [name] [id number] 49 y.o.  06/14/2015 9:32 AM  Chief Complaint: "alot is going on"  History of Present Illness: Pt reports one of her birds died in 03-26-2023. Pt states it was very traumatic for the pt. Pt was devastated for about 2 weeks. Pt has since gotten a new bird but the bird is not friendly.   Reports she is having terrible night sweats and will be following up with her PCP. States she had a "microstroke" at the end of Sept. States she admitted overnight to [facility] and her bleed healed itself. Pt is now being followed closely by her doctor.   Pt likes to be busy and creative. States she has 2 projects going on right now.   Pt has been working on her agoraphobia. She is able to sit on her porch for 30 min and has her windows and doors open. Pt has been mowing her yard. Panic attacks have decreased in frequency to 2x/month. It occurs when she knows she has to go out. Pt forces herself to go out anyway.   Pt is socializing more and is talking to her neighbors some.  Depression is present. She feels down about 3-4 days several times a month. Reports isolation, low motivation and anhedonia, crying spells, worthlessness and hopelessness.   Sleeping about 2-3 hrs/night even with 2 Trazodone per night. Energy is usually good.. She is cleaning her house and working on her crafts. She feels the desire to spend money but doesn't spend unless she has it. Denies impulsivity. Mood is a little more irritable than usual but not elevated.  Appetite is decreased and she eating small amounts of food. Marland Kitchen   PTSD continues- she hears noises and jumps, HV is a little better, she still checks her doors and windows,  Nightmares have increased to nightly and intrusive memories are near daily. Pt has flashbacks about 1-2x/month.  States things have gotten worse since her bird  died.   Takes Wellbutrin, Trazodone and Abilify as prescribed. Reports SE of dry mouth. She is using Biotin that is helping.   Pt missed several therapy appts but plans to restart it now.   Suicidal Ideation: No last time was in Sept- no plan at the time Plan Formed: No Patient has means to carry out plan: No  Homicidal Ideation: No Plan Formed: No Patient has means to carry out plan: No  Review of Systems: Psychiatric: Agitation: No Hallucination: unclear pt states her house is haunted. She hears disembodied voices daily. Denies command hallucinations. States her boyfriend will hear them as well. They had a paranormal group do an investigation who found voices, knocking, shadows walking across walls when no one was home.  Depressed Mood: Yes Insomnia: Yes Hypersomnia: No Altered Concentration: No Feels Worthless: Yes Grandiose Ideas: No Belief In Special Powers: No New/Increased Substance Abuse: No Compulsions: No   Neurologic: Headache: No Seizure: No Paresthesias: Yes bottom of both feet   Review of Systems  Constitutional: Negative for fever, chills and weight loss.       Night sweats  HENT: Positive for congestion. Negative for ear pain, nosebleeds and sore throat.   Eyes: Negative for blurred vision, pain and redness.  Respiratory: Negative for cough, sputum production and wheezing.   Cardiovascular: Positive for chest pain. Negative for palpitations and leg swelling.  Gastrointestinal: Negative for heartburn,  nausea, vomiting and abdominal pain.  Musculoskeletal: Positive for back pain and joint pain. Negative for neck pain.  Skin: Negative for itching and rash.  Neurological: Positive for sensory change. Negative for dizziness, tremors, seizures, loss of consciousness and headaches.  Endo/Heme/Allergies: Positive for environmental allergies.  Psychiatric/Behavioral: Positive for depression. Negative for suicidal ideas, hallucinations and substance abuse. The patient  is nervous/anxious and has insomnia.      Past Medical, Family, Social History: Pt lives in [place] with her boyfriend. She was raised by her adoptive parents and has 4 siblings. Pt has been married 3x and is divorced. She has 2 sons. 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 Pt  reports that she has been smoking Cigarettes.  She has a 20 pack-year smoking history. She has never used smokeless tobacco. She reports that she does not drink alcohol or use illicit drugs.  Family History  Problem Relation Age of Onset  . Adopted: Yes  . Bipolar disorder Mother     never diagnosed but patient suspects mother had bipolar disorder.Marland KitchenMarland KitchenMarland KitchenMarland Kitchenpt was adopeted, only knew birth mother for a short period of time.  Marland Kitchen Anxiety disorder Mother   . Suicidality Mother   . ADD / ADHD Neg Hx   . Alcohol abuse Neg Hx   . Drug abuse Neg Hx   . Dementia Neg Hx   . Depression Neg Hx   . OCD Neg Hx   . Paranoid behavior Neg Hx   . Schizophrenia Neg Hx   . Seizures Neg Hx   . Sexual abuse Neg Hx   . Physical abuse Neg Hx   . Turner syndrome Daughter   . Bipolar disorder Daughter     Past Medical History  Diagnosis Date  . Degenerative disc disease   . Asthma   . Bipolar 1 disorder ([place])   . Bipolar disorder ([place])   . Fibromyalgia   . IBS (irritable bowel syndrome) Diagnosed November 2012  . Hypertension 08/04/2012  . Renal disorder     kidney stones   . Polycystic kidney disease     ruled out  . Hepatic cyst      Outpatient Encounter Prescriptions as of 06/14/2015  Medication Sig  . albuterol (PROVENTIL HFA;VENTOLIN HFA) 108 (90 BASE) MCG/ACT inhaler Inhale 1-2 puffs into the lungs every 6 (six) hours as needed for wheezing or shortness of breath.  Marland Kitchen amLODipine (NORVASC) 10 MG tablet Take 10 mg by mouth daily.  . ARIPiprazole (ABILIFY) 5 MG tablet Take 1 tablet (5 mg total) by mouth daily.  Marland Kitchen buPROPion (WELLBUTRIN XL) 150 MG 24 hr tablet Take 1 tablet (150 mg total) by mouth daily.   . clindamycin (CLEOCIN T) 1 % lotion Apply 1 application topically daily as needed (for rash).   . traZODone (DESYREL) 100 MG tablet Take 1 tablet (100 mg total) by mouth at bedtime as needed for sleep. (Patient taking differently: Take 50-100 mg by mouth at bedtime as needed for sleep. )  . gabapentin (NEURONTIN) 100 MG capsule Take 100 mg by mouth 3 (three) times daily.   No facility-administered encounter medications on file as of 06/14/2015.    Past Psychiatric History/Hospitalization(s): Anxiety: Yes Bipolar Disorder: Yes Depression: Yes Mania: Yes Psychosis: Yes Schizophrenia: No Personality Disorder: No Hospitalization for psychiatric illness: Yes History of Electroconvulsive Shock Therapy: No Prior Suicide Attempts: Yes  Physical Exam: Constitutional:  BP 152/86 mmHg  Pulse 81  Ht 5\' 2"  (1.575 m)  Wt 185 lb  3.2 oz (84.006 kg)  BMI 33.86 kg/m2  BP elevated so pt instructed call her PCP today regarding her BP. Pt verbalized understanding and agreed to the plan.   General Appearance: alert, oriented, no acute distress  Musculoskeletal: Strength & Muscle Tone: within normal limits Gait & Station: normal, walking with cane due to pain Patient leans: N/A  Mental Status Examination/Evaluation: Objective: Attitude: Calm and cooperative  Appearance: Casual, appears to be stated age  Eye Contact::  Good  Speech:  Clear and Coherent and Normal Rate  Volume:  Normal  Mood:  depression  Affect:  Full Range  Thought Process:  Goal Directed, Linear and Logical  Orientation:  Full (Time, Place, and Person)  Thought Content:  Negative  Suicidal Thoughts:  No  Homicidal Thoughts:  No  Judgement:  Fair  Insight:  Fair  Concentration: good  Memory: Immediate-fair Recent-fair Remote-fair  Recall: fair  Language: fair  Gait and Station: normal  [employer] of Knowledge: average  Psychomotor Activity:  Normal  Akathisia:  No  Handed:  Right  AIMS (if indicated): n/a   Assets:  Communication Skills 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  Degenerative disc disease     .  Asthma     .  Bipolar 1 disorder     .  Bipolar disorder     .  Fibromyalgia     .  IBS (irritable bowel syndrome)  Diagnosed November 2012    .  Hypertension  08/04/2012    .  Renal disorder       kidney stones    .  Polycystic kidney disease       ruled out    .  Hepatic cyst    AXIS IV  problems related to social environment and problems with access to health care services   AXIS V  51-60 moderate symptoms     Treatment Plan/Recommendations:  Plan of Care: Medication management with supportive therapy. Risks/benefits and SE of the medication discussed. Pt verbalized understanding and verbal consent obtained for treatment. Affirm with the patient that the medications are taken as ordered. Patient expressed understanding of how their medications were to be used.     Laboratory:  EKG 03/20/2015 QTc 501, pt is working with a cardiologist 03/20/2015 Ca 8.8, glu 122, CBC WNL  Psychotherapy: Therapy: brief supportive therapy provided. Discussed psychosocial stressors in detail.   Medications:   IncreaseTrazodone to 200mg  po qHS for sleep and mood.   Abilify 5mg  po qHS for mood lability, anxiety. Pt would like to continue despite sexual side effects. Recommend she use lubrication when needed.  Increase Wellbutrin XL to 300mg  po qD for  mood and sexual side effects.   SE of dry mouth recommend biotin and sugar free hard candies  Routine PRN Medications: No   Consultations: refer to new therapist per pt request  Safety Concerns: Pt denies SI and is at an acute low risk for suicide.Patient told to call  clinic if any problems occur. Patient advised to go to ER if they should develop SI/HI, side effects, or if symptoms worsen. Has crisis numbers to call if needed. Pt verbalized understanding.    Other: F/up in 2 months or sooner if needed     Charlcie Cradle, MD 06/14/2015

## 2015-07-03 ENCOUNTER — Telehealth (HOSPITAL_COMMUNITY): Payer: Self-pay

## 2015-07-05 ENCOUNTER — Telehealth (HOSPITAL_COMMUNITY): Payer: Self-pay

## 2015-07-05 NOTE — Telephone Encounter (Signed)
Telephone message left for patient attempting to reach her back after she left a message requesting a call back regarding medication questions but did not leave any further information.  Requested patient call back to discuss.

## 2015-07-12 ENCOUNTER — Ambulatory Visit (INDEPENDENT_AMBULATORY_CARE_PROVIDER_SITE_OTHER): Payer: 59 | Admitting: Clinical

## 2015-07-12 ENCOUNTER — Encounter (HOSPITAL_COMMUNITY): Payer: Self-pay | Admitting: Clinical

## 2015-07-12 DIAGNOSIS — F3130 Bipolar disorder, current episode depressed, mild or moderate severity, unspecified: Secondary | ICD-10-CM | POA: Diagnosis not present

## 2015-07-12 DIAGNOSIS — F4310 Post-traumatic stress disorder, unspecified: Secondary | ICD-10-CM

## 2015-07-12 NOTE — Progress Notes (Signed)
   THERAPIST PROGRESS NOTE  Session Time: 1:32- 2:30  Participation Level: Active  Behavioral Response: CasualAlertAnxious  Type of Therapy: Individual Therapy  Treatment Goals addressed: improve psychiatric symptoms, Improve faulty thinking patterns, Elevate mood , emotional regulation skills, talk about a process trauma   Interventions: CBT and Motivational Interviewing, Grounding and Mindfulness Techniques  Summary: [name] is a 49 y.o. female who presents with Bipolar 1 disorder, mixed, moderate, PTSD  Suicidal/Homicidal: No -without intent/plan  Therapist Response: [name] met with clinician for an individual session. [name] discussed his psychiatric symptoms, her current life events and her homework. She shared that she had a little manic episode in which she cleaned the house and now is unable to locate her bipolar packet 3. Clinician printed her a new one which she agreed to complete before next session. She shared that she is on week two of the mania. She shared that she is sleeping very little  - about 3 hours (even with trazodone) She is cleaning, crafting, talking more. She shared that her husband is keeping the funds so that she does not over spend which can happen  When she is manic. [name] and clinician discussed how to use her grounding and mindfulness skills to help her manage her mania. Client and clinician practiced a mindfulness skill together.   [name] shared that they had a memorial service  For her bird snowy. She shared that this helped her to deal with the grief better. She shared that the new birds are not quite finger trained yet, but she is enjoying the distraction of training them. [name] shared that the biggest stressor to her right now is the fact that she had to take a restraining order out on her daughter. She stated that her daughter had made statements indicating an ideation for killing or harming her in some way. She shared that she had to also take  one out on her boyfriend because he is her mode of transportation.  She shared that both her and her husband are scared. Client and clinician discussed some healthy emotional regulation skills as well as healthy coping skills to ensure she and her husband are safe. [name] shared that she had cut back substantially on her smoking which helped improve her mood. She shared that she is using a vape and has cut back to a half pack of cigs a day.  Client and clinician discussed the importance of owning her successes as they appear. [name] agreed to complete her homework before next session.  Plan: Return again in 1 weeks.  Diagnosis: Axis I:   Bipolar 1 disorder, mixed, moderate, PTSD   [name], LCSW 07/12/2015

## 2015-08-07 ENCOUNTER — Ambulatory Visit (HOSPITAL_COMMUNITY): Payer: Self-pay | Admitting: Clinical

## 2015-08-14 ENCOUNTER — Ambulatory Visit (HOSPITAL_COMMUNITY): Payer: Self-pay | Admitting: Clinical

## 2015-08-14 ENCOUNTER — Encounter (HOSPITAL_COMMUNITY): Payer: Self-pay | Admitting: Psychiatry

## 2015-08-14 ENCOUNTER — Ambulatory Visit (INDEPENDENT_AMBULATORY_CARE_PROVIDER_SITE_OTHER): Payer: 59 | Admitting: Psychiatry

## 2015-08-14 VITALS — BP 140/80 | HR 70 | Ht 62.0 in | Wt 187.2 lb

## 2015-08-14 DIAGNOSIS — G4700 Insomnia, unspecified: Secondary | ICD-10-CM | POA: Diagnosis not present

## 2015-08-14 DIAGNOSIS — F3130 Bipolar disorder, current episode depressed, mild or moderate severity, unspecified: Secondary | ICD-10-CM | POA: Diagnosis not present

## 2015-08-14 DIAGNOSIS — F4310 Post-traumatic stress disorder, unspecified: Secondary | ICD-10-CM | POA: Diagnosis not present

## 2015-08-14 DIAGNOSIS — F411 Generalized anxiety disorder: Secondary | ICD-10-CM | POA: Diagnosis not present

## 2015-08-14 DIAGNOSIS — F410 Panic disorder [episodic paroxysmal anxiety] without agoraphobia: Secondary | ICD-10-CM

## 2015-08-14 MED ORDER — ZOLPIDEM TARTRATE 5 MG PO TABS: 5.0000 mg | ORAL_TABLET | Freq: Every evening | ORAL | Status: DC | PRN

## 2015-08-14 MED ORDER — BUPROPION HCL ER (XL) 300 MG PO TB24: 300.0000 mg | ORAL_TABLET | Freq: Every day | ORAL | Status: DC

## 2015-08-14 MED ORDER — ARIPIPRAZOLE 5 MG PO TABS: 5.0000 mg | ORAL_TABLET | Freq: Every day | ORAL | Status: DC

## 2015-08-14 NOTE — Progress Notes (Signed)
Patient ID: [name], female   DOB: 03/16/1966, 50 y.o.   MRN: [id number] Patient ID: [name], female   DOB: 1965/11/14, 50 y.o.   MRN: [id number]  [facility] [id number] Progress Note  [place] [id number] 50 y.o.  08/14/2015 9:40 AM  Chief Complaint: "alot is going on"  History of Present Illness: Pt's 55yo niece died in her sleep 3 days ago. Pt states it was unexpected and no one knows what happened. Pt was very close to her and is having a hard time dealing with the loss. Pt is feeling numb.    Pt likes to be busy and creative. States she has 2 projects going on right now. 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  Pt has been working on her agoraphobia. She is able to sit on her porch for 30 min and has her windows and doors open. Pt has been mowing her yard. Panic attacks have stopped.  It occurs when she knows she has to go out. Pt forces herself to go out anyway. Pt is going to the stores and is actually enjoying it.  Pt is socializing more and is talking to her neighbors some.  Depression is present but was getting better until 3 days ago She feels down about 3-4 days several times a month. Reports isolation, low motivation, crying spells, worthlessness and hopelessness. Her interests were improving.   Sleeping about 2-3 hrs/night even with 2 Trazodone per night. Energy is usually good. She feels the desire to spend money but doesn't spend unless she has it. Denies impulsivity. Mood is a little more irritable than usual but not elevated.  Appetite is decreased and she eating small amounts of food.  Denies manic and hypomanic symptoms including periods of decreased need for sleep, increased energy, mood lability, impulsivity, FOI, and excessive spending.  PTSD continues- she hears noises and jumps, HV is a little worse due to recent death, she still checks her doors and windows,  Nightmares have increased to nightly and intrusive memories are near  daily. Pt has flashbacks about 1-2x/month but have increased in the last 3 days.  States things have gotten worse since her bird died.   Takes Wellbutrin, Trazodone and Abilify as prescribed. Reports SE of dry mouth. She is using Biotin that is helping.   Pt is going tol therapy and it is going well.   Suicidal Ideation: No  Plan Formed: No Patient has means to carry out plan: No  Homicidal Ideation: No Plan Formed: No Patient has means to carry out plan: No  Review of Systems: Psychiatric: Agitation: No Hallucination: unclear pt states her house is haunted. She hears disembodied voices daily. Denies command hallucinations. States her boyfriend will hear them as well. They had a paranormal group do an investigation who found voices, knocking, shadows walking across walls when no one was home.  Depressed Mood: Yes Insomnia: Yes Hypersomnia: No Altered Concentration: No Feels Worthless: Yes Grandiose Ideas: No Belief In Special Powers: No New/Increased Substance Abuse: No Compulsions: No   Neurologic: Headache: No Seizure: No Paresthesias: Yes bottom of both feet   Review of Systems  Constitutional: Negative for fever, chills and weight loss.       Night sweats  HENT: Positive for congestion. Negative for ear pain, nosebleeds and sore throat.   Eyes: Negative for blurred vision, pain and redness.  Respiratory: Negative for cough, sputum production and wheezing.   Cardiovascular: Positive for chest  pain. Negative for palpitations and leg swelling.  Gastrointestinal: Negative for heartburn, nausea, vomiting and abdominal pain.  Musculoskeletal: Positive for back pain and joint pain. Negative for neck pain.  Skin: Negative for itching and rash.  Neurological: Positive for sensory change. Negative for dizziness, tremors, seizures, loss of consciousness and headaches.  Endo/Heme/Allergies: Positive for environmental allergies.  Psychiatric/Behavioral: Positive for depression.  Negative for suicidal ideas, hallucinations and substance abuse. The patient is nervous/anxious and has insomnia.      Past Medical, Family, Social History: Pt lives in [place] with her boyfriend. She was raised by her adoptive parents and has 4 siblings. Pt has been married 3x and is divorced. She has 2 sons. 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 Pt  reports that she has been smoking Cigarettes.  She has a 20 pack-year smoking history. She has never used smokeless tobacco. She reports that she does not drink alcohol or use illicit drugs.  Family History  Problem Relation Age of Onset  . Adopted: Yes  . Bipolar disorder Mother     never diagnosed but patient suspects mother had bipolar disorder.Marland KitchenMarland KitchenMarland KitchenMarland Kitchenpt was adopeted, only knew birth mother for a short period of time.  Marland Kitchen Anxiety disorder Mother   . Suicidality Mother   . ADD / ADHD Neg Hx   . Alcohol abuse Neg Hx   . Drug abuse Neg Hx   . Dementia Neg Hx   . Depression Neg Hx   . OCD Neg Hx   . Paranoid behavior Neg Hx   . Schizophrenia Neg Hx   . Seizures Neg Hx   . Sexual abuse Neg Hx   . Physical abuse Neg Hx   . Turner syndrome Daughter   . Bipolar disorder Daughter     Past Medical History  Diagnosis Date  . Degenerative disc disease   . Asthma   . Bipolar 1 disorder ([place])   . Bipolar disorder ([place])   . Fibromyalgia   . IBS (irritable bowel syndrome) Diagnosed November 2012  . Hypertension 08/04/2012  . Renal disorder     kidney stones   . Polycystic kidney disease     ruled out  . Hepatic cyst      Outpatient Encounter Prescriptions as of 08/14/2015  Medication Sig  . albuterol (PROVENTIL HFA;VENTOLIN HFA) 108 (90 BASE) MCG/ACT inhaler Inhale 1-2 puffs into the lungs every 6 (six) hours as needed for wheezing or shortness of breath.  . ARIPiprazole (ABILIFY) 5 MG tablet Take 1 tablet (5 mg total) by mouth daily.  Marland Kitchen buPROPion (WELLBUTRIN XL) 300 MG 24 hr tablet Take 1 tablet (300 mg total) by  mouth daily.  . nebivolol (BYSTOLIC) 10 MG tablet Take 10 mg by mouth daily.  . traZODone (DESYREL) 100 MG tablet Take 2 tablets (200 mg total) by mouth at bedtime as needed for sleep.  . clindamycin (CLEOCIN T) 1 % lotion Apply 1 application topically daily as needed (for rash). Reported on 08/14/2015  . [DISCONTINUED] amLODipine (NORVASC) 10 MG tablet Take 10 mg by mouth daily. Reported on 07/12/2015  . [DISCONTINUED] gabapentin (NEURONTIN) 100 MG capsule Take 100 mg by mouth 3 (three) times daily. Reported on 07/12/2015   No facility-administered encounter medications on file as of 08/14/2015.    Past Psychiatric History/Hospitalization(s): Anxiety: Yes Bipolar Disorder: Yes Depression: Yes Mania: Yes Psychosis: Yes Schizophrenia: No Personality Disorder: No Hospitalization for psychiatric illness: Yes History of Electroconvulsive Shock Therapy: No Prior Suicide Attempts: Yes  Physical Exam: Constitutional:  BP 140/80 mmHg  Pulse 70  Ht 5\' 2"  (1.575 m)  Wt 187 lb 3.2 oz (84.913 kg)  BMI 34.23 kg/m2  BP elevated and pt is monitoring it at home and working with her PCP  General Appearance: alert, oriented, no acute distress  Musculoskeletal: Strength & Muscle Tone: within normal limits Gait & Station: normal, walking with cane due to pain Patient leans: N/A  Mental Status Examination/Evaluation: Objective: Attitude: Calm and cooperative  Appearance: Casual, appears to be stated age  Eye Contact::  Good  Speech:  Clear and Coherent and Normal Rate  Volume:  Normal  Mood:  depression  Affect:  Full Range  Thought Process:  Goal Directed, Linear and Logical  Orientation:  Full (Time, Place, and Person)  Thought Content:  Negative  Suicidal Thoughts:  No  Homicidal Thoughts:  No  Judgement:  Fair  Insight:  Fair  Concentration: good  Memory: Immediate-fair Recent-fair Remote-fair  Recall: fair  Language: fair  Gait and Station: normal  [employer] of Knowledge:  average  Psychomotor Activity:  Normal  Akathisia:  No  Handed:  Right  AIMS (if indicated): n/a  Assets:  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 Risks/benefits and SE of the medication discussed. Pt verbalized understanding and verbal consent obtained for treatment. Affirm with the patient that the medications are taken as ordered. Patient expressed understanding of how their medications were to be used.     Laboratory:  EKG 03/20/2015 QTc 501, pt is working with a cardiologist 03/20/2015 Ca 8.8, glu 122, CBC WNL  Psychotherapy: Therapy: brief supportive therapy provided. Discussed psychosocial stressors in detail.   Medications:   D/c Trazodone   Start trial of Ambien 5mg  po qHS prn sleep  Abilify 5mg  po qHS for mood lability, anxiety. Pt would like to continue despite sexual side effects. Recommend she use lubrication when needed.  Wellbutrin XL 300mg  po qD for mood and sexual side effects.   SE of dry mouth recommend biotin and sugar free hard candies  Routine PRN Medications: No   Consultations: refer to new therapist per pt request  Safety Concerns: Pt denies SI and is at an acute low risk for suicide.Patient told to call clinic if any problems occur. Patient advised to go to ER if they should develop SI/HI, side effects, or if symptoms worsen. Has crisis  numbers to call if needed. Pt verbalized understanding.    Other: F/up in 2 months or sooner if needed     Charlcie Cradle, MD 08/14/2015

## 2015-08-15 ENCOUNTER — Ambulatory Visit (HOSPITAL_COMMUNITY): Payer: Self-pay | Admitting: Clinical

## 2015-08-15 ENCOUNTER — Telehealth (HOSPITAL_COMMUNITY): Payer: Self-pay

## 2015-08-15 DIAGNOSIS — G4700 Insomnia, unspecified: Secondary | ICD-10-CM

## 2015-08-15 NOTE — Telephone Encounter (Signed)
Patient called and lvm, the ambien that was prescribed is not covered by her insurance. She would like to know if there is something else she can take. Please advise, thank you

## 2015-08-16 MED ORDER — DOXEPIN HCL 10 MG PO CAPS: 10.0000 mg | ORAL_CAPSULE | Freq: Every evening | ORAL | Status: DC | PRN

## 2015-08-16 NOTE — Telephone Encounter (Signed)
Pt states she is not able to afford 30 tabs of Ambien ($490). She is asking for another medication. Sleep remains poor even with Trazodone. She is getting about 2 hrs and is having nightmares.  A/P: Insomnia 1.d/c Trazodone 2. D/c Ambien  3. Start trial of Doxepin 10mg  po qHS prn insomnia

## 2015-08-29 ENCOUNTER — Other Ambulatory Visit (HOSPITAL_COMMUNITY): Payer: Self-pay | Admitting: Psychiatry

## 2015-09-10 ENCOUNTER — Ambulatory Visit (HOSPITAL_COMMUNITY): Payer: Self-pay | Admitting: Clinical

## 2015-09-17 ENCOUNTER — Ambulatory Visit (HOSPITAL_COMMUNITY): Payer: Self-pay | Admitting: Clinical

## 2015-09-24 ENCOUNTER — Ambulatory Visit (HOSPITAL_COMMUNITY): Payer: Self-pay | Admitting: Clinical

## 2015-09-25 DIAGNOSIS — Z9181 History of falling: Secondary | ICD-10-CM | POA: Insufficient documentation

## 2015-09-25 DIAGNOSIS — K589 Irritable bowel syndrome without diarrhea: Secondary | ICD-10-CM | POA: Insufficient documentation

## 2015-10-11 ENCOUNTER — Ambulatory Visit (INDEPENDENT_AMBULATORY_CARE_PROVIDER_SITE_OTHER): Payer: 59 | Admitting: Psychiatry

## 2015-10-11 ENCOUNTER — Encounter (HOSPITAL_COMMUNITY): Payer: Self-pay | Admitting: Psychiatry

## 2015-10-11 VITALS — BP 139/82 | HR 86 | Ht 61.0 in | Wt 181.2 lb

## 2015-10-11 DIAGNOSIS — G4700 Insomnia, unspecified: Secondary | ICD-10-CM

## 2015-10-11 DIAGNOSIS — F4310 Post-traumatic stress disorder, unspecified: Secondary | ICD-10-CM

## 2015-10-11 DIAGNOSIS — F411 Generalized anxiety disorder: Secondary | ICD-10-CM

## 2015-10-11 DIAGNOSIS — F3130 Bipolar disorder, current episode depressed, mild or moderate severity, unspecified: Secondary | ICD-10-CM | POA: Diagnosis not present

## 2015-10-11 MED ORDER — BUPROPION HCL ER (XL) 300 MG PO TB24: 300.0000 mg | ORAL_TABLET | Freq: Every day | ORAL | Status: DC

## 2015-10-11 MED ORDER — LORAZEPAM 1 MG PO TABS: 1.0000 mg | ORAL_TABLET | Freq: Two times a day (BID) | ORAL | Status: DC | PRN

## 2015-10-11 MED ORDER — ARIPIPRAZOLE 5 MG PO TABS
5.0000 mg | ORAL_TABLET | Freq: Every day | ORAL | Status: DC
Start: 2015-10-11 — End: 2015-12-31

## 2015-10-11 NOTE — Progress Notes (Signed)
[facility] [phone] Progress Note  [name] [id number] 50 y.o.  10/11/2015 10:17 AM  Chief Complaint: "alot is going on"  History of Present Illness: Pt likes to be busy and creative. States she has 2 projects going on right now. 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  Agoraphobia is worse and she is always worried about being in a MVA. Boyfriend forces her to go the store and pt often uses the buggy more when in stores. She can stay in store but has trouble getting into the car.   Pt is socializing more and is talking to her neighbors some.  Depression is present. She feels down about 1-2 days several times a month. Reports isolation, low motivation, crying spells, worthlessness and hopelessness are all improving. Her interests were improving.   Sleeping about 2-3 hrs/night even with Doxepin. Pt has trouble falling and staying asleep. Energy is usually good. Appetite is decreased and she eating small amounts of food.  For 4 days pt only slept for 45 min a night and had excess energy. Pt would clean and knit and play games. Pt was very restless and couldn't sit down. Pt was very restless and mood was euphoric. She have her CC to her boyfriend because hew as feeling impulsive. Denies impulsivity. It occurred last week and symptoms have resolved.    PTSD continues- HV is high since her boyfriend was in a MVA 2 weeks ago.  Nightmares have increased to nightly and intrusive memories are near daily. Pt has flashbacks about 1-2x/month but have increased in the last 3 days. Pt has been using mindfulness to deal with it.   Takes Wellbutrin, Doxepin and Abilify as prescribed. Reports SE of dry mouth.  Pt is going to therapy and it is going well. She missed 2 appointment due to lack of transportation.   Suicidal Ideation: No  Plan Formed: No Patient has means to carry out plan: No  Homicidal Ideation: No Plan Formed: No Patient has means to carry out plan:  No  Review of Systems: Psychiatric: Agitation: No Hallucination: unclear pt states her house is haunted. She hears disembodied voices daily. Denies command hallucinations. States her boyfriend will hear them as well. They had a paranormal group do an investigation who found voices, knocking, shadows walking across walls when no one was home.  Depressed Mood: Yes Insomnia: Yes Hypersomnia: No Altered Concentration: No Feels Worthless: No Grandiose Ideas: No Belief In Special Powers: No New/Increased Substance Abuse: No Compulsions: No   Neurologic: Headache: No Seizure: No Paresthesias: Yes bottom of both feet   Review of Systems  Constitutional: Negative for fever, chills and weight loss.       Night sweats  HENT: Positive for congestion. Negative for ear pain, nosebleeds and sore throat.   Eyes: Negative for blurred vision, pain and redness.  Respiratory: Positive for cough. Negative for sputum production and wheezing.   Cardiovascular: Positive for chest pain. Negative for palpitations and leg swelling.  Gastrointestinal: Negative for heartburn, nausea, vomiting and abdominal pain.  Musculoskeletal: Positive for back pain and joint pain. Negative for neck pain.  Skin: Negative for itching and rash.  Neurological: Positive for sensory change. Negative for dizziness, tremors, seizures, loss of consciousness and headaches.  Endo/Heme/Allergies: Positive for environmental allergies.  Psychiatric/Behavioral: Positive for depression. Negative for suicidal ideas, hallucinations and substance abuse. The patient is nervous/anxious and has insomnia.      Past Medical, Family, Social History: Pt lives  in [place] with her boyfriend. She was raised by her adoptive parents and has 4 siblings. Pt has been married 3x and is divorced. She has 2 sons. 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 Pt  reports that she has been smoking Cigarettes.  She has a 20 pack-year smoking  history. She has never used smokeless tobacco. She reports that she does not drink alcohol or use illicit drugs.  Family History  Problem Relation Age of Onset  . Adopted: Yes  . Bipolar disorder Mother     never diagnosed but patient suspects mother had bipolar disorder.Marland KitchenMarland KitchenMarland KitchenMarland Kitchenpt was adopeted, only knew birth mother for a short period of time.  Marland Kitchen Anxiety disorder Mother   . Suicidality Mother   . ADD / ADHD Neg Hx   . Alcohol abuse Neg Hx   . Drug abuse Neg Hx   . Dementia Neg Hx   . Depression Neg Hx   . OCD Neg Hx   . Paranoid behavior Neg Hx   . Schizophrenia Neg Hx   . Seizures Neg Hx   . Sexual abuse Neg Hx   . Physical abuse Neg Hx   . Turner syndrome Daughter   . Bipolar disorder Daughter     Past Medical History  Diagnosis Date  . Degenerative disc disease   . Asthma   . Bipolar 1 disorder ([place])   . Bipolar disorder ([place])   . Fibromyalgia   . IBS (irritable bowel syndrome) Diagnosed November 2012  . Hypertension 08/04/2012  . Renal disorder     kidney stones   . Polycystic kidney disease     ruled out  . Hepatic cyst   . Plantar fasciitis, bilateral      Outpatient Encounter Prescriptions as of 10/11/2015  Medication Sig  . albuterol (PROVENTIL HFA;VENTOLIN HFA) 108 (90 BASE) MCG/ACT inhaler Inhale 1-2 puffs into the lungs every 6 (six) hours as needed for wheezing or shortness of breath.  . ARIPiprazole (ABILIFY) 5 MG tablet Take 1 tablet (5 mg total) by mouth daily.  Marland Kitchen buPROPion (WELLBUTRIN XL) 300 MG 24 hr tablet Take 1 tablet (300 mg total) by mouth daily.  Marland Kitchen doxepin (SINEQUAN) 10 MG capsule Take 1 capsule (10 mg total) by mouth at bedtime as needed.  Marland Kitchen losartan-hydrochlorothiazide (HYZAAR) 50-12.5 MG tablet Take 1 tablet by mouth daily.  . clindamycin (CLEOCIN T) 1 % lotion Apply 1 application topically daily as needed (for rash). Reported on 10/11/2015  . zolpidem (AMBIEN) 5 MG tablet Take 1 tablet (5 mg total) by mouth at bedtime as needed for sleep.  (Patient not taking: Reported on 10/11/2015)  . [DISCONTINUED] nebivolol (BYSTOLIC) 10 MG tablet Take 10 mg by mouth daily. Reported on 10/11/2015   No facility-administered encounter medications on file as of 10/11/2015.    Past Psychiatric History/Hospitalization(s): Anxiety: Yes Bipolar Disorder: Yes Depression: Yes Mania: Yes Psychosis: Yes Schizophrenia: No Personality Disorder: No Hospitalization for psychiatric illness: Yes History of Electroconvulsive Shock Therapy: No Prior Suicide Attempts: Yes  Physical Exam: Constitutional:  BP 139/82 mmHg  Pulse 86  Ht 5\' 1"  (1.549 m)  Wt 181 lb 3.2 oz (82.192 kg)  BMI 34.26 kg/m2  BP elevated and pt is monitoring it at home and working with her PCP  General Appearance: alert, oriented, no acute distress  Musculoskeletal: Strength & Muscle Tone: within normal limits Gait & Station: normal, walking with cane due to pain Patient leans: straight  Mental Status Examination/Evaluation: Objective: Attitude: Calm  and cooperative  Appearance: Casual, appears to be stated age  Eye Contact::  Good  Speech:  Clear and Coherent and Normal Rate  Volume:  Normal  Mood:  Depression and anxious  Affect:  Full Range  Thought Process:  Goal Directed, Linear and Logical  Orientation:  Full (Time, Place, and Person)  Thought Content:  Negative  Suicidal Thoughts:  No  Homicidal Thoughts:  No  Judgement:  Fair  Insight:  Fair  Concentration: good  Memory: Immediate-fair Recent-fair Remote-fair  Recall: fair  Language: fair  Gait and Station: normal  [employer] of Knowledge: average  Psychomotor Activity:  Normal  Akathisia:  No  Handed:  Right  AIMS (if indicated): n/a  Assets:  Communication Skills 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 Risks/benefits and SE of the medication discussed. Pt verbalized understanding and verbal consent obtained for treatment. Affirm with the patient that the medications are taken as ordered. Patient expressed understanding of how their medications were to be used.     Laboratory:  EKG 03/20/2015 QTc 501, pt is working with a cardiologist 03/20/2015 Ca 8.8, glu 122, CBC WNL  Psychotherapy: Therapy: brief supportive therapy provided. Discussed psychosocial stressors in detail.   Medications:   D/c Doxepin  Start trial of Ativan 1mg  po qHS prn sleep  Abilify 5mg  po qHS for mood lability, anxiety. Pt would like to continue despite sexual side effects. Recommend she use lubrication when needed.  Wellbutrin XL 300mg  po qD for mood and sexual side effects.   SE of dry mouth recommend biotin and sugar free hard candies  Routine PRN Medications: No   Consultations: refer to new therapist per pt request  Safety Concerns: Pt denies SI and is at an acute low risk for suicide.Patient told to call clinic if any problems occur. Patient advised to go to ER if they should develop SI/HI, side effects, or if symptoms worsen. Has crisis numbers to call if needed. Pt verbalized understanding.    Other: F/up in 2 months or sooner if needed     Charlcie Cradle, MD 10/11/2015

## 2015-10-12 ENCOUNTER — Ambulatory Visit (INDEPENDENT_AMBULATORY_CARE_PROVIDER_SITE_OTHER): Payer: Medicare Other

## 2015-10-12 ENCOUNTER — Ambulatory Visit (INDEPENDENT_AMBULATORY_CARE_PROVIDER_SITE_OTHER): Payer: Medicare Other | Admitting: Podiatry

## 2015-10-12 ENCOUNTER — Encounter: Payer: Self-pay | Admitting: Podiatry

## 2015-10-12 VITALS — BP 115/73 | HR 77 | Resp 12

## 2015-10-12 DIAGNOSIS — M722 Plantar fascial fibromatosis: Secondary | ICD-10-CM | POA: Diagnosis not present

## 2015-10-12 DIAGNOSIS — B351 Tinea unguium: Secondary | ICD-10-CM | POA: Diagnosis not present

## 2015-10-12 DIAGNOSIS — M79672 Pain in left foot: Secondary | ICD-10-CM

## 2015-10-12 DIAGNOSIS — M79671 Pain in right foot: Secondary | ICD-10-CM

## 2015-10-12 MED ORDER — TRIAMCINOLONE ACETONIDE 10 MG/ML IJ SUSP
10.0000 mg | Freq: Once | INTRAMUSCULAR | Status: AC
  Administered 2015-10-12: 10 mg

## 2015-10-12 NOTE — Patient Instructions (Signed)
Plantar Fasciitis Plantar fasciitis is a painful foot condition that affects the heel. It occurs when the band of tissue that connects the toes to the heel bone (plantar fascia) becomes irritated. This can happen after exercising too much or doing other repetitive activities (overuse injury). The pain from plantar fasciitis can range from mild irritation to severe pain that makes it difficult for you to walk or move. The pain is usually worse in the morning or after you have been sitting or lying down for a while. CAUSES This condition may be caused by:  Standing for long periods of time.  Wearing shoes that do not fit.  Doing high-impact activities, including running, aerobics, and ballet.  Being overweight.  Having an abnormal way of walking (gait).  Having tight calf muscles.  Having high arches in your feet.  Starting a new athletic activity. SYMPTOMS The main symptom of this condition is heel pain. Other symptoms include:  Pain that gets worse after activity or exercise.  Pain that is worse in the morning or after resting.  Pain that goes away after you walk for a few minutes. DIAGNOSIS This condition may be diagnosed based on your signs and symptoms. Your health care provider will also do a physical exam to check for:  A tender area on the bottom of your foot.  A high arch in your foot.  Pain when you move your foot.  Difficulty moving your foot. You may also need to have imaging studies to confirm the diagnosis. These can include:  X-rays.  Ultrasound.  MRI. TREATMENT  Treatment for plantar fasciitis depends on the severity of the condition. Your treatment may include:  Rest, ice, and over-the-counter pain medicines to manage your pain.  Exercises to stretch your calves and your plantar fascia.  A splint that holds your foot in a stretched, upward position while you sleep (night splint).  Physical therapy to relieve symptoms and prevent problems in the  future.  Cortisone injections to relieve severe pain.  Extracorporeal shock wave therapy (ESWT) to stimulate damaged plantar fascia with electrical impulses. It is often used as a last resort before surgery.  Surgery, if other treatments have not worked after 12 months. HOME CARE INSTRUCTIONS  Take medicines only as directed by your health care provider.  Avoid activities that cause pain.  Roll the bottom of your foot over a bag of ice or a bottle of cold water. Do this for 20 minutes, 3-4 times a day.  Perform simple stretches as directed by your health care provider.  Try wearing athletic shoes with air-sole or gel-sole cushions or soft shoe inserts.  Wear a night splint while sleeping, if directed by your health care provider.  Keep all follow-up appointments with your health care provider. PREVENTION   Do not perform exercises or activities that cause heel pain.  Consider finding low-impact activities if you continue to have problems.  Lose weight if you need to. The best way to prevent plantar fasciitis is to avoid the activities that aggravate your plantar fascia. SEEK MEDICAL CARE IF:  Your symptoms do not go away after treatment with home care measures.  Your pain gets worse.  Your pain affects your ability to move or do your daily activities.   This information is not intended to replace advice given to you by your health care provider. Make sure you discuss any questions you have with your health care provider.   Document Released: 04/08/2001 Document Revised: 04/04/2015 Document Reviewed: 05/24/2014 Elsevier   Interactive Patient Education 2016 [employer]  

## 2015-10-12 NOTE — Progress Notes (Signed)
   Subjective:    Patient ID: [name], female    DOB: [birth year], 50 y.o.   MRN: [id number]  HPI PT STATED B/L BOTTOM/SIDE OF THE HEELS BEEN PAINFUL FOR 5 MONTHS. HEELS ARE WORSE FIRST STEP IN THE MORNING OR WHEN SITTING FOR A WHILE THEN STAND UP THEY GET SORE. TRIED ICING, NSAID-NO RELIEF.   Review of Systems  Constitutional: Positive for fatigue.  HENT: Positive for hearing loss and sinus pressure.   Respiratory: Positive for cough and shortness of breath.   Gastrointestinal: Positive for abdominal pain.  Musculoskeletal: Positive for myalgias and gait problem.  Psychiatric/Behavioral: The patient is nervous/anxious.        Objective:   Physical Exam        Assessment & Plan:

## 2015-10-12 NOTE — Progress Notes (Signed)
Subjective:     Patient ID: [name], female   DOB: Nov 10, 1965, 50 y.o.   MRN: [id number]  HPI patient presents stating both of my heels that been bothering me for about 5 months with the left worse than the right. There also to make it hard for me to walk they're worse when I get up in the morning and after periods of sitting   Review of Systems  All other systems reviewed and are negative.      Objective:   Physical Exam  Constitutional: She is oriented to person, place, and time.  Cardiovascular: Intact distal pulses.   Musculoskeletal: Normal range of motion.  Neurological: She is oriented to person, place, and time.  Skin: Skin is warm.  Nursing note and vitals reviewed.  neurovascular status intact muscle strength adequate range of motion within normal limits with patient found to have dry plantar skin with some irritation of the tissue and is noted to have exquisite discomfort plantar heel bilateral with inflammation around the medial band as it inserts into the navicular. Patient's found have good digital perfusion is well oriented 3     Assessment:     Acute plantar fasciitis bilateral heel with fungal skin infection    Plan:     H&P and both conditions reviewed with patient. Reviewed x-rays and today I injected the plantar heel bilateral 3 mg Kenalog 5 of applied fascial brace bilateral. Gave instructions on physical therapy and shoe gear modifications and discussed possible oral Lamisil if we can get the symptoms under control.  X-ray report indicated small spur formation with no indication of stress fracture

## 2015-10-15 ENCOUNTER — Telehealth (HOSPITAL_COMMUNITY): Payer: Self-pay

## 2015-10-15 DIAGNOSIS — G4700 Insomnia, unspecified: Secondary | ICD-10-CM

## 2015-10-15 NOTE — Telephone Encounter (Signed)
Telephone call with patient to follow up on a message she left she had a reaction to started Ativan.  Patient reported she began to break out into a rash, states this was the only medication recently changed and that she stopped it and the rash is going away.  Patient reported she used benadryl to help with itching and rash and agreed to pass this information on to Dr. Doyne Keel as she will be back in the office on 10/16/15.  Instructed patient not to take any more medication until discussed with Dr. Doyne Keel and patient agreed with plan.

## 2015-10-16 MED ORDER — CLONIDINE HCL 0.1 MG PO TABS: 0.1000 mg | ORAL_TABLET | Freq: Every day | ORAL | Status: DC

## 2015-10-16 NOTE — Telephone Encounter (Signed)
Ask if she has tried Clonidine. It could help with sleep and nightmares. D/C Ativan and put it under allergies list. If she is willing to try Clonidine then give 0.1mg  po qHS prn insomnia #30.

## 2015-10-16 NOTE — Telephone Encounter (Signed)
I called the patient and she had not tried the clonidine, I called it into her pharmacy and patient will call if there are any problems

## 2015-10-19 ENCOUNTER — Ambulatory Visit (INDEPENDENT_AMBULATORY_CARE_PROVIDER_SITE_OTHER): Payer: Medicare Other | Admitting: Podiatry

## 2015-10-19 ENCOUNTER — Encounter: Payer: Self-pay | Admitting: Podiatry

## 2015-10-19 VITALS — BP 123/64 | HR 62 | Resp 12

## 2015-10-19 DIAGNOSIS — B351 Tinea unguium: Secondary | ICD-10-CM | POA: Diagnosis not present

## 2015-10-19 DIAGNOSIS — M722 Plantar fascial fibromatosis: Secondary | ICD-10-CM

## 2015-10-21 NOTE — Progress Notes (Signed)
Subjective:     Patient ID: [name], female   DOB: [birth year], 50 y.o.   MRN: [id number]  HPI patient presents stating my feet are feeling a lot better but I'm still having some pain with my dry skin   Review of Systems     Objective:   Physical Exam Neurovascular status unchanged with patient found to have crack skin plantar aspect of the heel bilateral with fissuring but no drainage or open areas    Assessment:     Improved fasciitis with patient having dry skin condition    Plan:     Convey this to patient and recommended aggressive Vaseline with Saran wrap and utilization of deep creams to try to control this. Continue with stretching exercises supportive shoes for plantar fasciitis

## 2015-10-22 ENCOUNTER — Encounter: Payer: Self-pay | Admitting: Diagnostic Neuroimaging

## 2015-10-22 ENCOUNTER — Telehealth: Payer: Self-pay | Admitting: Diagnostic Neuroimaging

## 2015-10-22 ENCOUNTER — Ambulatory Visit (INDEPENDENT_AMBULATORY_CARE_PROVIDER_SITE_OTHER): Payer: Medicare Other | Admitting: Diagnostic Neuroimaging

## 2015-10-22 VITALS — BP 125/73 | HR 71 | Ht 61.0 in | Wt 189.0 lb

## 2015-10-22 DIAGNOSIS — M5442 Lumbago with sciatica, left side: Secondary | ICD-10-CM

## 2015-10-22 DIAGNOSIS — M542 Cervicalgia: Secondary | ICD-10-CM

## 2015-10-22 NOTE — Telephone Encounter (Signed)
Pt called said [facility] transportation in [place] is requesting medical order for transportation to take pt to [facility] which she does qualify for. Please fax # [phone]

## 2015-10-22 NOTE — Progress Notes (Signed)
[facility]  PATIENT: [name] DOB: [birth year]  REFERRING CLINICIAN: Eldridge Abrahams HISTORY FROM: patient  REASON FOR VISIT: new consult    HISTORICAL  CHIEF COMPLAINT:  Chief Complaint  Patient presents with  . Degeneration of cervical disks    rm 6, New Pt, fell at age 50 during sports, fx neck/back, no surgery"    HISTORY OF PRESENT ILLNESS:   50 year old right-handed female here for evaluation of low back pain and neck pain. Patient has had low back pain since 2005. She describes pain in her right lower back, radiating to the left side, down the left buttock. She's been evaluated by spine doctor, neurologist, pain management doctor in the past. She's had x-rays, MRI of the lumbar spine, epidural steroid injections and radiofrequency ablation procedures from 2010 until 2015 without relief. Patient was on pain medications in the past but she does not want to get back on them. Patient has not tried physical therapy recently.  Patient also has some squeezing pressure sensation in her neck without radiation into her arms.  Patient had a back and neck injury at age 50 years old years old playing volleyball.   REVIEW OF SYSTEMS: Full 14 system review of systems performed and negative with exception of: Weight gain fatigue chest pain hearing loss ringing in ears shortness of breath cough snoring diarrhea incontinence joint pain joint swelling aching muscles headache numbness weakness insomnia depression anxiety racing thoughts decreased activities not enough sleep.   ALLERGIES: Allergies  Allergen Reactions  . Barium-Containing Compounds Other (See Comments)    Stomach cramps, extreme diarrhea, and vomiting  . Bee Venom Anaphylaxis  . Lithium Other (See Comments)    Agitation and hostility  . Sulfa Antibiotics Anaphylaxis  . Lisinopril     cough  . Seldane [Terfenadine] Nausea And Vomiting and Other (See Comments)    Causes first layer of skin to peel  .  Tetracyclines & Related Hives and Nausea And Vomiting  . Aspirin Rash and Other (See Comments)    Upset stomach  . Erythromycin Nausea And Vomiting, Rash and Other (See Comments)    Cramps    HOME MEDICATIONS: Outpatient Prescriptions Prior to Visit  Medication Sig Dispense Refill  . albuterol (PROVENTIL HFA;VENTOLIN HFA) 108 (90 BASE) MCG/ACT inhaler Inhale 1-2 puffs into the lungs every 6 (six) hours as needed for wheezing or shortness of breath.    . ARIPiprazole (ABILIFY) 5 MG tablet Take 1 tablet (5 mg total) by mouth daily. 30 tablet 1  . buPROPion (WELLBUTRIN XL) 300 MG 24 hr tablet Take 1 tablet (300 mg total) by mouth daily. 30 tablet 1  . cloNIDine (CATAPRES) 0.1 MG tablet Take 1 tablet (0.1 mg total) by mouth at bedtime. 30 tablet 0  . losartan-hydrochlorothiazide (HYZAAR) 50-12.5 MG tablet Take 1 tablet by mouth daily.    . clindamycin (CLEOCIN T) 1 % lotion Apply 1 application topically daily as needed (for rash). Reported on 10/11/2015    . LORazepam (ATIVAN) 1 MG tablet Take 1 tablet (1 mg total) by mouth 3 times/day as needed-between meals & bedtime for anxiety or sleep. 30 tablet 1   No facility-administered medications prior to visit.    PAST MEDICAL HISTORY: Past Medical History  Diagnosis Date  . Degenerative disc disease   . Asthma   . Bipolar 1 disorder ([place])   . Bipolar disorder ([place])   . Fibromyalgia   . IBS (irritable bowel syndrome) Diagnosed November 2012  . Hypertension 08/04/2012  .  Renal disorder     kidney stones   . Polycystic kidney disease     ruled out  . Hepatic cyst   . Plantar fasciitis, bilateral     PAST SURGICAL HISTORY: Past Surgical History  Procedure Laterality Date  . Shoulder surgery      left  . Abdominal hysterectomy  1997  . Abdominal surgery    . Cesarean section  1987  . Cholecystectomy  1998  . Appendectomy    . Upper gastrointestinal endoscopy  November 2012  . Colonoscopy  November 2012  . Melanoma removed  January  2013    removed from back  . Cesarean section    . Carpal tunnel release    . Hepatic cyst removal      FAMILY HISTORY: Family History  Problem Relation Age of Onset  . Adopted: Yes  . Bipolar disorder Mother     never diagnosed but patient suspects mother had bipolar disorder.Marland KitchenMarland KitchenMarland KitchenMarland Kitchenpt was adopeted, only knew birth mother for a short period of time.  Marland Kitchen Anxiety disorder Mother   . Suicidality Mother   . ADD / ADHD Neg Hx   . Alcohol abuse Neg Hx   . Drug abuse Neg Hx   . Dementia Neg Hx   . Depression Neg Hx   . OCD Neg Hx   . Paranoid behavior Neg Hx   . Schizophrenia Neg Hx   . Seizures Neg Hx   . Sexual abuse Neg Hx   . Physical abuse Neg Hx   . Turner syndrome Daughter   . Bipolar disorder Daughter     SOCIAL HISTORY:  Social History   Social History  . Marital Status: Single    Spouse Name: N/A  . Number of Children: 2  . Years of Education: 33 1/2   Occupational History  .      disabled   Social History Main Topics  . Smoking status: Current Every Day Smoker -- 0.50 packs/day for 40 years    Types: Cigarettes  . Smokeless tobacco: Never Used     Comment: 10/22/15 1/2 - 1 PPD  . Alcohol Use: No     Comment: quit 1997  . Drug Use: No     Comment: quit 2010  . Sexual Activity: Not on file   Other Topics Concern  . Not on file   Social History Narrative   Lives with boyfriend   Caffeine use- soda 2-3 cans, coffee 3 cups daily     PHYSICAL EXAM  GENERAL EXAM/CONSTITUTIONAL: Vitals:  Filed Vitals:   10/22/15 0911  BP: 125/73  Pulse: 71  Height: 5\' 1"  (1.549 m)  Weight: 189 lb (85.73 kg)     Body mass index is 35.73 kg/(m^2).  Visual Acuity Screening   Right eye Left eye Both eyes  Without correction:     With correction: 20/30 20/40      Patient is in no distress; well developed, nourished and groomed; neck is supple  CARDIOVASCULAR:  Examination of carotid arteries is normal; no carotid bruits  Regular rate and rhythm, no  murmurs  Examination of peripheral vascular system by observation and palpation is normal  EYES:  Ophthalmoscopic exam of optic discs and posterior segments is normal; no papilledema or hemorrhages  MUSCULOSKELETAL:  Gait, strength, tone, movements noted in Neurologic exam below  NEUROLOGIC: MENTAL STATUS:  No flowsheet data found.  awake, alert, oriented to person, place and time  recent and remote memory intact  normal attention and concentration  language fluent, comprehension intact, naming intact,   fund of knowledge appropriate  CRANIAL NERVE:   2nd - no papilledema on fundoscopic exam  2nd, 3rd, 4th, 6th - pupils equal and reactive to light, visual fields full to confrontation, extraocular muscles intact, no nystagmus  5th - facial sensation symmetric  7th - facial strength symmetric  8th - hearing intact  9th - palate elevates symmetrically, uvula midline  11th - shoulder shrug symmetric  12th - tongue protrusion midline  MOTOR:   normal bulk and tone, full strength in the BUE, BLE  SENSORY:   normal and symmetric to light touch, temperature, vibration; DECR VIB IN RIGHT HAND  COORDINATION:   finger-nose-finger, fine finger movements normal  REFLEXES:   deep tendon reflexes TRACE and symmetric  GAIT/STATION:   ANTALGIC GAIT; SLOW; narrow based gait; romberg is negative    DIAGNOSTIC DATA (LABS, IMAGING, TESTING) - I reviewed patient records, labs, notes, testing and imaging myself where available.  Lab Results  Component Value Date   WBC 9.6 03/20/2015   HGB 14.5 03/20/2015   HCT 44.7 03/20/2015   MCV 91.2 03/20/2015   PLT 288 03/20/2015      Component Value Date/Time   NA 137 03/20/2015 0602   K 3.8 03/20/2015 0602   CL 102 03/20/2015 0602   CO2 26 03/20/2015 0602   GLUCOSE 122* 03/20/2015 0602   BUN 8 03/20/2015 0602   CREATININE 0.63 03/20/2015 0602   CREATININE 0.74 02/08/2014 0723   CALCIUM 8.8* 03/20/2015 0602    PROT 7.2 03/20/2015 0602   ALBUMIN 3.8 03/20/2015 0602   AST 22 03/20/2015 0602   ALT 24 03/20/2015 0602   ALKPHOS 66 03/20/2015 0602   BILITOT 0.9 03/20/2015 0602   GFRNONAA >60 03/20/2015 0602   GFRNONAA >89 02/08/2014 0723   GFRAA >60 03/20/2015 0602   GFRAA >89 02/08/2014 0723   No results found for: CHOL, HDL, LDLCALC, LDLDIRECT, TRIG, CHOLHDL Lab Results  Component Value Date   HGBA1C 6.0* 02/08/2014   No results found for: [id number] Lab Results  Component Value Date   TSH 2.243 02/08/2014    03/20/15 MRI brain [I reviewed images myself and agree with interpretation. -VRP]  1. No acute finding, including infarct. 2. Changes of mild chronic small vessel ischemia.  02/17/13 MRI cervical spine [I reviewed images myself and agree with interpretation. -VRP]  - Minimal degenerative disc disease in the cervical spine.  Small soft disc protrusion to the right at C6-7 as described.  02/17/13 MRI lumbar spine [I reviewed images myself and agree with interpretation. -VRP]  - L4-L5, L5-S1: facet arthrosis without stenosis.     ASSESSMENT AND PLAN  50 y.o. year old female here with chronic neck pain, chronic low back pain, with deconditioning and decreased exercise tolerance. Advised patient on diagnosis, prognosis and treatment options. Suspect patient has significant musculoskeletal strain component, myofascial pain, and possible fibromyalgia. Encouraged patient to gradually start physical activity and exercise training program.  Dx:  1. Neck pain   2. Bilateral low back pain with left-sided sciatica     PLAN:  Orders Placed This Encounter  Procedures  . Ambulatory referral to Physical Therapy   Return in about 3 months (around 01/22/2016), or if symptoms worsen or fail to improve.    Penni Bombard, MD [id number], [id number] AM Certified in Neurology, Neurophysiology and Neuroimaging  [facility] [address], [place], [place]  [address] [phone]

## 2015-10-22 NOTE — Telephone Encounter (Signed)
Spoke with patient and informed her that Dr Leta Baptist has referred her for formal physical therapy in rehab. She stated Dr Leta Baptist "told her that a program like [facility] would be good for her too". Advised her PCP may be able to assist her with transportation to [facility], but if not, she can fax a form for Dr Tish Frederickson to complete if one is available. She verbalized understanding, appreciation.

## 2015-10-22 NOTE — Patient Instructions (Signed)
Thank you for coming to see [place] at [facility]. I hope we have been able to provide you high quality care today.  You may receive a patient satisfaction survey over the next few weeks. We would appreciate your feedback and comments so that we may continue to improve ourselves and the health of our patients.  - I will setup physical therapy - try online beginner videos for back pain exercises and yoga; use your judgement and take it easy in the beginning   ~~~~~~~~~~~~~~~~~~~~~~~~~~~~~~~~~~~~~~~~~~~~~~~~~~~~~~~~~~~~~~~~~  DR. PENUMALLI'S GUIDE TO HAPPY AND HEALTHY LIVING These are some of my general health and wellness recommendations. Some of them may apply to you better than others. Please use common sense as you try these suggestions and feel free to ask me any questions.   ACTIVITY/FITNESS Mental, social, emotional and physical stimulation are very important for brain and body health. Try learning a new activity (arts, music, language, sports, games).  Keep moving your body to the best of your abilities. You can do this at home, inside or outside, the park, community center, gym or anywhere you like. Consider a physical therapist or personal trainer to get started. Consider the app Sworkit. Fitness trackers such as smart-watches, smart-phones or Fitbits can help as well.   NUTRITION Eat more plants: colorful vegetables, nuts, seeds and berries.  Eat less sugar, salt, preservatives and processed foods.  Avoid toxins such as cigarettes and alcohol.  Drink water when you are thirsty. Warm water with a slice of lemon is an excellent morning drink to start the day.  Consider these websites for more information The Nutrition Source (https://www.henry-hernandez.biz/) Precision Nutrition (WindowBlog.ch)   RELAXATION Consider practicing mindfulness meditation or other relaxation techniques such as deep breathing, prayer, yoga,  tai chi, massage. See website mindful.org or the apps Headspace or Calm to help get started.   SLEEP Try to get at least 7-8+ hours sleep per day. Regular exercise and reduced caffeine will help you sleep better. Practice good sleep hygeine techniques. See website sleep.org for more information.   PLANNING Prepare estate planning, living will, healthcare POA documents. Sometimes this is best planned with the help of an attorney. Theconversationproject.org and agingwithdignity.org are excellent resources.

## 2015-10-24 ENCOUNTER — Other Ambulatory Visit (HOSPITAL_COMMUNITY)
Admission: RE | Admit: 2015-10-24 | Discharge: 2015-10-24 | Disposition: A | Discharge status: Home / Self Care | Payer: Medicare Other | Source: Ambulatory Visit | Attending: Nurse Practitioner | Admitting: Nurse Practitioner

## 2015-10-24 DIAGNOSIS — R531 Weakness: Secondary | ICD-10-CM | POA: Diagnosis not present

## 2015-10-24 DIAGNOSIS — I10 Essential (primary) hypertension: Secondary | ICD-10-CM | POA: Diagnosis present

## 2015-10-24 LAB — CBC
HEMATOCRIT: 44.4 % (ref 36.0–46.0)
HEMOGLOBIN: 14.8 g/dL (ref 12.0–15.0)
MCH: 30.9 pg (ref 26.0–34.0)
MCHC: 33.3 g/dL (ref 30.0–36.0)
MCV: 92.7 fL (ref 78.0–100.0)
Platelets: 361 10*3/uL (ref 150–400)
RBC: 4.79 MIL/uL (ref 3.87–5.11)
RDW: 13.2 % (ref 11.5–15.5)
WBC: 15.6 10*3/uL — AB (ref 4.0–10.5)

## 2015-10-24 LAB — BASIC METABOLIC PANEL
ANION GAP: 8 (ref 5–15)
BUN: 12 mg/dL (ref 6–20)
CALCIUM: 9.1 mg/dL (ref 8.9–10.3)
CHLORIDE: 104 mmol/L (ref 101–111)
CO2: 27 mmol/L (ref 22–32)
Creatinine, Ser: 0.81 mg/dL (ref 0.44–1.00)
GFR calc non Af Amer: >60 mL/min (ref >60)
Glucose, Bld: 101 mg/dL — ABNORMAL HIGH (ref 65–99)
Potassium: 3.6 mmol/L (ref 3.5–5.1)
Sodium: 139 mmol/L (ref 135–145)

## 2015-11-08 ENCOUNTER — Encounter (HOSPITAL_COMMUNITY): Payer: Self-pay | Admitting: Clinical

## 2015-11-08 ENCOUNTER — Ambulatory Visit (INDEPENDENT_AMBULATORY_CARE_PROVIDER_SITE_OTHER): Payer: 59 | Admitting: Clinical

## 2015-11-08 DIAGNOSIS — F3162 Bipolar disorder, current episode mixed, moderate: Secondary | ICD-10-CM | POA: Diagnosis not present

## 2015-11-08 DIAGNOSIS — F4310 Post-traumatic stress disorder, unspecified: Secondary | ICD-10-CM | POA: Diagnosis not present

## 2015-11-08 NOTE — Progress Notes (Signed)
   THERAPIST PROGRESS NOTE  Session Time: 8:02 -9:00  Participation Level: Active  Behavioral Response: CasualAlertAngry  Type of Therapy: Individual Therapy  Treatment Goals addressed: improve psychiatric symptoms, Improve faulty thinking patterns, Elevate mood (increased socialization,), emotional regulation skills,    Interventions: CBT and Motivational Interviewing, Grounding and Mindfulness Techniques  Summary: [name] is a 50 y.o. female who presents with Bipolar 1 disorder, mixed, moderate, PTSD  Suicidal/Homicidal: No -without intent/plan  Therapist Response: Katharine Look met with clinician for an individual session. [name] discussed his psychiatric symptoms, her current life events and her homework. She shared that she had experienced both mania and depression. She shared that she volunteered to do more than she felt capable of doing and that this was driving her to feel depressed and overwhelmed. She shared that this then fed a bunch on her old negative automatic thoughts. Clinician introduced a 7 panel thought record sheet. Client and clinician completed the record sheet together using her negative thoughts. [name] identified the evidence for and against the thoughts, she was then able to formulate healthier alternative thoughts. Clinician explained black and white thinking and Gradie identified her black and white thoughts. She recognized that she does not hold anyone else up the standard of perfectionism she holds herself to. Client and clinician discussed how the standard affects her. [name] had the insight that it takes the pleasure away from what se was trying to accomplish and makes it more difficult for her to complete her tasks. Her shared that without the standard she would be less stressed in her relationship. Client and clinician reviewed how to challenge her negative thoughts and beliefs. [name] shared that she lost her bipolar package #3 and clinician gave her a new  one. She agreed to complete it before next session as well as to continue practicing her grounding and mindfulness techniques  Plan: Return again in 1 weeks.  Diagnosis: Axis I:   Bipolar 1 disorder, mixed, moderate, PTSD   Lewi Drost A, LCSW 11/08/2015

## 2015-11-15 ENCOUNTER — Ambulatory Visit: Payer: Medicare Other | Admitting: Rehabilitative and Restorative Service Providers"

## 2015-11-16 ENCOUNTER — Other Ambulatory Visit (HOSPITAL_COMMUNITY): Payer: Self-pay | Admitting: Psychiatry

## 2015-11-22 ENCOUNTER — Ambulatory Visit (INDEPENDENT_AMBULATORY_CARE_PROVIDER_SITE_OTHER): Payer: 59 | Admitting: Clinical

## 2015-11-22 ENCOUNTER — Encounter (HOSPITAL_COMMUNITY): Payer: Self-pay | Admitting: Clinical

## 2015-11-22 DIAGNOSIS — G4700 Insomnia, unspecified: Secondary | ICD-10-CM | POA: Diagnosis not present

## 2015-11-22 DIAGNOSIS — F3162 Bipolar disorder, current episode mixed, moderate: Secondary | ICD-10-CM

## 2015-11-22 DIAGNOSIS — F411 Generalized anxiety disorder: Secondary | ICD-10-CM

## 2015-11-22 DIAGNOSIS — F4310 Post-traumatic stress disorder, unspecified: Secondary | ICD-10-CM

## 2015-11-22 NOTE — Progress Notes (Signed)
   THERAPIST PROGRESS NOTE  Session Time: 8:02 - 8:58  Participation Level: Active  Behavioral Response: CasualAlertAnxious  Type of Therapy: Individual Therapy  Treatment Goals addressed: improve psychiatric symptoms, Improve unhelpful thought patterns, Elevate mood , emotional regulation skills,    Interventions: CBT and Motivational Interviewing, Grounding and Mindfulness Techniques  Summary: [name] is a 50 y.o. female who presents with Bipolar 1 disorder, mixed, moderate, PTSD, Alcohol Dependence in Remission, and Marijuana abuse in Remission  Suicidal/Homicidal: No -without intent/plan  Therapist Response: Katharine Look met with clinician for an individual session. [name] discussed his psychiatric symptoms, her current life events and her homework. She shared that she had completed her homework packet. Client and clinician reviewed and discussed her thoughts and insights. She shared that she experiencing mania lately. Client and clinician discussed her symptoms. Clinician asked open ended questions and [name] shared some of her problematic behaviors. Clinician asked open ended questions and [name] was able to formulate some solutions she was willing to do to address these behaviors. [name] shared about racing thoughts. Client and clinician reviewed grounding and mindfulness techniques. Client and clinician discussed how she could use guided meditation to help her unwind. Katharine Look and clinician discussed some of [name]'s thought process. [name] identified whether or not the thoughts were helpful. [name] was then able to formulate healthier thoughts. [name] greed to complete the next packet (bipolar #4 ) before next session and to practice the things discussed in todays session.    Plan: Return again in 1 weeks.  Diagnosis: Axis I:   Bipolar 1 disorder, mixed, moderate, PTSD, Alcohol Dependence in Remission, and Marijuana abuse in Remission    Marjon Doxtater A, LCSW 11/22/2015

## 2015-12-03 ENCOUNTER — Telehealth (HOSPITAL_COMMUNITY): Payer: Self-pay

## 2015-12-03 NOTE — Telephone Encounter (Signed)
Patient is calling because she thinks the Wellbutrin may be causing side effects. She states that everytime she lights up a cigarette it makes her sick to her stomach. I called the patient back and let her know that it may be caused from the Wellbutrin, but that I would check with the doctor. In the meantime I encouraged the patient to take this opportunity to quit smoking - she stated that she may do that. Please review and advise, thank you.

## 2015-12-04 NOTE — Telephone Encounter (Signed)
Called the patient and she is in agreement with quitting, she told me that she just smoked her last cigarette. Patient will call back if there are any problems.

## 2015-12-04 NOTE — Telephone Encounter (Signed)
Wellbutrin is used for smoking cessation. She should work on Homeland smoking.

## 2015-12-06 ENCOUNTER — Encounter (HOSPITAL_COMMUNITY): Payer: Self-pay | Admitting: Clinical

## 2015-12-06 ENCOUNTER — Ambulatory Visit (INDEPENDENT_AMBULATORY_CARE_PROVIDER_SITE_OTHER): Payer: 59 | Admitting: Clinical

## 2015-12-06 DIAGNOSIS — F3162 Bipolar disorder, current episode mixed, moderate: Secondary | ICD-10-CM

## 2015-12-06 DIAGNOSIS — F411 Generalized anxiety disorder: Secondary | ICD-10-CM | POA: Diagnosis not present

## 2015-12-06 DIAGNOSIS — F4310 Post-traumatic stress disorder, unspecified: Secondary | ICD-10-CM

## 2015-12-06 NOTE — Progress Notes (Addendum)
THERAPIST PROGRESS NOTE  Session Time: 8:03 - 9:00   Participation Level: Active  Behavioral Response: CasualAlertAnxious  Type of Therapy: Individual Therapy  Treatment Goals addressed: improve psychiatric symptoms, Improve unhelpful thought patterns, Elevate mood (increased socialization, decreased spending, and decrease emotional eating), emotional regulation skills,   Interventions: Motivational Interviewing, Grounding and Mindfulness Techniques, psychoeducation  Summary: [name] is a 49 y.o. female who presents with Bipolar 1 disorder, mixed, moderate, PTSD, Alcohol Dependence in Remission, and Marijuana abuse in Remission  Suicidal/Homicidal: No -without intent/plan  Therapist Response: [name] met with clinician for an individual session. [name] discussed his psychiatric symptoms, her current life events and her homework. She shared that she had less mania than prior. She shared that she has been working to use the tools used in therapy to regulate her emotions. Client and clinician discussed what had worked for her as well as ways to improve her practice. Client and clinician reviewed and discussed her homework packet. Client and clinician discussed her thoughts and insights from the homework packet. Client and clinician discussed her pleasurable activities catalogue. [name] discussed the activities she tried and charted. Clinician asked open ended questions and [name] identified ways she could continue to improve her mood through social interaction. [name] shared that she has been working to reduce her smoking and emotional eating. Client and clinician discussed her efforts. Client and clinician discussed the skills she has been using. Client and clinician discussed using her mindfulness and grounding techniques to help her continue her progress. [name] agreed to complete packet 5 bipolar and bring it with her to next session.  [name] also shared about her sister who is dying  of cancer. Client and clinician discussed emotional regulation skills - allowing for natural feelings while not being overwhelmed by unhelpful narratives.  Plan: Return again in 1 weeks.  Diagnosis: Axis I:   Bipolar 1 disorder, mixed, moderate, PTSD, Alcohol Dependence in Remission, and Marijuana abuse in Remission    , A, LCSW 12/06/2015  

## 2015-12-07 ENCOUNTER — Other Ambulatory Visit: Payer: Self-pay | Admitting: General Surgery

## 2015-12-13 DIAGNOSIS — G4733 Obstructive sleep apnea (adult) (pediatric): Secondary | ICD-10-CM | POA: Insufficient documentation

## 2015-12-14 ENCOUNTER — Telehealth: Payer: Self-pay | Admitting: *Deleted

## 2015-12-14 NOTE — Telephone Encounter (Signed)
ADDALYNNE HENK              CID [id number]  Patient SAME                 Pt's Dr [facility]    Area Code 336 Phone# [phone] * DOB [phone]     RE CANCEL APPT 6/27 @9 :45AM, NEEDS TO RESCHEDULE                                                          Disp:Y/N N If Y = C/B If No Response In 5minutes ============================================================ Pt r/s appt

## 2015-12-20 ENCOUNTER — Ambulatory Visit (HOSPITAL_COMMUNITY): Payer: Self-pay | Admitting: Psychiatry

## 2015-12-21 ENCOUNTER — Encounter (HOSPITAL_COMMUNITY): Payer: Self-pay

## 2015-12-21 NOTE — H&P (Signed)
[name]. [name]  Location: [facility] Patient #: [id number] DOB: [birth year] Single / Language: Cleophus Molt / Race: White Female        History of Present Illness       The patient is a 50 year old female who presents with an incisional hernia. This is a 50 year old Caucasian female, referred by Eldridge Abrahams, FNP for evaluation of a symptomatic incisional hernia in the periumbilical area. Tammy Luciana Axe is her primary care physician. She is also followed at behavioral health for her bipolar disorder.      The patient has a past history of open cholecystectomy in 1987. She had a diagnostic laparoscopy in 1997 leading to a diagnosis of ovarian cancer. She had a total abdominal hysterectomy and bilateral salpingo-oophorectomy in [facility] by her gynecologist. It does not sound she had omentectomy or lymph node dissection. She says she had stage I cervical cancer and stage II ovarian cancer. No known recurrence. Not followed regularly.      She gives a three-year history of intermittent pain and intermittent bulging at her umbilicus. This is getting a little bit worse. Beginning to interfere with her daily activities. She would like something done electively.     Comorbidities include bipolar disorder, very stable on medication. Cigarette smoker. Says she smokes 5-6 cigarettes a day. Chronic fatigue. Chronic anxiety. Status post open cholecystectomy. Status post TAH and BSO. Benign hypertension. GERD. Asthma.     Sociual history reveals she lives in [place]. He is single but has a boyfriend. Has 2 children that live in [place]. Does smoke but denies alcohol. Disabled because of bipolar disorder.      She wanted to go ahead with elective surgery. She'll be scheduled for laparoscopic repair of her incarcerated incisional hernia with mesh, possible open repair. I discussed the indications, details, techniques, and numerous risk of the surgery with her. She  is aware of the risk of bleeding, infection, recurrence of the hernia, injury to adjacent organs requiring major reinstructed surgery or even sepsis. Skin necrosis. Nerve damage with chronic pain. Cardiac pulmonary thromboembolic problems. She understands these issues well. At this time. Questions were answered. She agrees with this plan. She is quite alert and oriented with good insight. She is very competent to understand the consequences of her medical decision making.   Other Problems  Anxiety Disorder Arthritis Asthma Back Pain Cervical Cancer Chest pain Cholelithiasis Chronic Obstructive Lung Disease Depression Gastroesophageal Reflux Disease High blood pressure Oophorectomy Bilateral. Ovarian Cancer Umbilical Hernia Repair  Past Surgical History Appendectomy Cesarean Section - 1 Colon Polyp Removal - Colonoscopy Gallbladder Surgery - Open Hysterectomy (due to cancer) - Complete Liver Surgery Oral Surgery  Diagnostic Studies History  Colonoscopy 1-5 years ago Mammogram 1-3 years ago Pap Smear 1-5 years ago  Allergies  Aspirin *ANALGESICS - NonNarcotic* Barium Sulfate *DIAGNOSTIC PRODUCTS* Lisinopril *ANTIHYPERTENSIVES* Lithium *ANTIPSYCHOTICS/ANTIMANIC AGENTS* Sulfa 10 *OPHTHALMIC AGENTS*   Medication History  BuPROPion HCl ER (XL) (300MG  Tablet ER 24HR, Oral) Active. Losartan Potassium-HCTZ (50-12.5MG  Tablet, Oral) Active. CloNIDine HCl (0.1MG  Tablet, Oral) Active. ProAir HFA (108 (90 Base)MCG/ACT Aerosol Soln, Inhalation) Active. ARIPiprazole (5MG  Tablet, Oral) Active. Medications Reconciled  Social History Alcohol use Remotely quit alcohol use. Caffeine use Carbonated beverages, Coffee. Illicit drug use Remotely quit drug use. Tobacco use Current every day smoker.  Family History  Cervical Cancer Mother. Depression Mother. Diabetes Mellitus Mother. Heart Disease Family Members In General,  Mother. Heart disease in female family member before age 3 Hypertension Mother. Melanoma Family  Members In General. Ovarian Cancer Daughter, Mother.  Pregnancy / Birth History  Age at menarche 21 years. Age of menopause <44 Contraceptive History Oral contraceptives. Gravida 2 Irregular periods Maternal age 24-20 Para 2    Review of Systems General Present- Fatigue, Night Sweats and Weight Gain. Not Present- Appetite Loss, Chills, Fever and Weight Loss. Skin Not Present- Change in Wart/Mole, Dryness, Hives, Jaundice, New Lesions, Non-Healing Wounds, Rash and Ulcer. HEENT Present- Hearing Loss, Ringing in the Ears, Seasonal Allergies and Wears glasses/contact lenses. Not Present- Earache, Hoarseness, Nose Bleed, Oral Ulcers, Sinus Pain, Sore Throat, Visual Disturbances and Yellow Eyes. Respiratory Present- Chronic Cough. Not Present- Bloody sputum, Difficulty Breathing, Snoring and Wheezing. Breast Not Present- Breast Mass, Breast Pain, Nipple Discharge and Skin Changes. Cardiovascular Present- Chest Pain. Not Present- Difficulty Breathing Lying Down, Leg Cramps, Palpitations, Rapid Heart Rate, Shortness of Breath and Swelling of Extremities. Gastrointestinal Present- Abdominal Pain, Bloating, Chronic diarrhea, Constipation, Excessive gas and Gets full quickly at meals. Not Present- Bloody Stool, Change in Bowel Habits, Difficulty Swallowing, Hemorrhoids, Indigestion, Nausea, Rectal Pain and Vomiting. Female Genitourinary Present- Frequency, Nocturia and Urgency. Not Present- Painful Urination and Pelvic Pain. Musculoskeletal Present- Back Pain, Joint Pain and Joint Stiffness. Not Present- Muscle Pain, Muscle Weakness and Swelling of Extremities. Neurological Not Present- Decreased Memory, Fainting, Headaches, Numbness, Seizures, Tingling, Tremor, Trouble walking and Weakness. Psychiatric Present- Anxiety, Bipolar, Change in Sleep Pattern and Depression. Not Present- Fearful and  Frequent crying. Endocrine Present- Heat Intolerance and Hot flashes. Not Present- Cold Intolerance, Excessive Hunger, Hair Changes and New Diabetes.  Vitals  Weight: 190 lb Height: 61in Body Surface Area: 1.85 m Body Mass Index: 35.9 kg/m  Temp.: 97.89F(Temporal)  Pulse: 101 (Irregular)  BP: 132/72 (Sitting, Left Arm, Standard)       Physical Exam  General Mental Status-Alert. General Appearance-Consistent with stated age. Hydration-Well hydrated. Voice-Normal. Note: BMI 35.9. Central obesity   Head and Neck Head-normocephalic, atraumatic with no lesions or palpable masses. Trachea-midline. Thyroid Gland Characteristics - normal size and consistency.  Eye Eyeball - Bilateral-Extraocular movements intact. Sclera/Conjunctiva - Bilateral-No scleral icterus.  Chest and Lung Exam Chest and lung exam reveals -quiet, even and easy respiratory effort with no use of accessory muscles and on auscultation, normal breath sounds, no adventitious sounds and normal vocal resonance. Inspection Chest Wall - Normal. Back - normal.  Cardiovascular Cardiovascular examination reveals -normal heart sounds, regular rate and rhythm with no murmurs and normal pedal pulses bilaterally.  Abdomen Note: Obese. Soft. Partially incarcerated hernia at the umbilicus. Well-healed infraumbilical transverse curvilinear incision. Bigger bulge when she stands. Hernia sac probably 4 cm or less. Cannot completely reduce. Otherwise soft and nontender. Well-healed right subcostal scar. No hernia there. Well-healed fannenstiel incision.   Neurologic Neurologic evaluation reveals -alert and oriented x 3 with no impairment of recent or remote memory. Mental Status-Normal.  Musculoskeletal Normal Exam - Left-Upper Extremity Strength Normal and Lower Extremity Strength Normal. Normal Exam - Right-Upper Extremity Strength Normal and Lower Extremity Strength  Normal.  Lymphatic Head & Neck  General Head & Neck Lymphatics: Bilateral - Description - Normal. Axillary  General Axillary Region: Bilateral - Description - Normal. Tenderness - Non Tender. Femoral & Inguinal  Generalized Femoral & Inguinal Lymphatics: Bilateral - Description - Normal. Tenderness - Non Tender.    Assessment & Plan INCISIONAL HERNIA WITH OBSTRUCTION (K43.0) Current Plans Pt Education - Pamphlet Given - Hernia Surgery: discussed with patient and provided information. The anatomy & physiology of the abdominal wall was  discussed. The pathophysiology of hernias was discussed. Natural history risks without surgery including progeressive enlargement, pain, incarceration, & strangulation was discussed. Contributors to complications such as smoking, obesity, diabetes, prior surgery, etc were discussed.  I feel the risks of no intervention will lead to serious problems that outweigh the operative risks; therefore, I recommended surgery to reduce and repair the hernia. I explained laparoscopic techniques with possible need for an open approach. I noted the probable use of mesh to patch and/or buttress the hernia repair  Risks such as bleeding, infection, abscess, need for further treatment, heart attack, death, and other risks were discussed. I noted a good likelihood this will help address the problem. Goals of post-operative recovery were discussed as well. Possibility that this will not correct all symptoms was explained. I stressed the importance of low-impact activity, aggressive pain control, avoiding constipation, & not pushing through pain to minimize risk of post-operative chronic pain or injury. Possibility of reherniation especially with smoking, obesity, diabetes, immunosuppression, and other health conditions was discussed. We will work to minimize complications.  An educational handout further explaining the pathology & treatment options was given as well. Questions  were answered. The patient expresses understanding & wishes to proceed with surgery.   You have a tender, only partially reducible hernia at her umbilicus. I think this is an incisional hernia related to her previous gynecologic surgery and incision. We have discussed open techniques and laparoscopic techniques of hernia repair, and I have recommended the use of mesh. The hernia is not huge, so I think you're a good candidate for laparoscopic repair  you will be scheduled for elective laparoscopic repair of your incarcerated incisional hernia with mesh, possible open repair We discussed the indications, techniques, and numerous risk of the surgery  Please read the printed information that we have given you.  BIPOLAR DISORDER IN REMISSION (F31.70) Impression: Following that [facility] behavioral health.  BMI 35.0-35.9,ADULT (Z68.35) TOBACCO ABUSE (Z72.0) HYPERTENSION, ESSENTIAL (I10) ASTHMA, MODERATE (J45.998) CHRONIC GERD (K21.9)  HISTORY OF TOTAL ABDOMINAL HYSTERECTOMY AND BILATERAL  SALPINGO-OOPHORECTOMY (Z90.710) Impression: By history, surgery done in [place] by gynecologist for stage I cervical cancer and stage II ovarian cancer. No known recurrence. Not followed by GYN oncology.    Edsel Petrin. Dalbert Batman, M.D., [facility] General and Minimally invasive Surgery Breast and Colorectal Surgery Office:   [phone] Pager:   [phone]

## 2015-12-21 NOTE — Pre-Procedure Instructions (Signed)
    Finlay Waligora Mowbray  12/21/2015      [facility] - [place], [place] [address] Phone: [phone] Fax: [phone]    Your procedure is scheduled on 12/26/15.  Report to [facility]mitting at 1145 A.M.  Call this number if you have problems the morning of surgery:  [phone]   Remember:  Do not eat food or drink liquids after midnight.  Take these medicines the morning of surgery with A SIP OF WATER --all inhalers,amlodipine,abilify,wellbutrin,prilosec   Do not wear jewelry, make-up or nail polish.  Do not wear lotions, powders, or perfumes.  You may wear deodorant.  Do not shave 48 hours prior to surgery.  Men may shave face and neck.  Do not bring valuables to the hospital.  [facility] is not responsible for any belongings or valuables.  Contacts, dentures or bridgework may not be worn into surgery.  Leave your suitcase in the car.  After surgery it may be brought to your room.  For patients admitted to the hospital, discharge time will be determined by your treatment team.  Patients discharged the day of surgery will not be allowed to drive home.   Name and phone number of your driver:   Special instructions:    Please read over the following fact sheets that you were given.

## 2015-12-25 ENCOUNTER — Encounter (HOSPITAL_COMMUNITY)
Admission: RE | Admit: 2015-12-25 | Discharge: 2015-12-25 | Disposition: A | Discharge status: Home / Self Care | Payer: Medicare Other | Source: Ambulatory Visit | Attending: General Surgery | Admitting: General Surgery

## 2015-12-25 ENCOUNTER — Encounter (HOSPITAL_COMMUNITY): Payer: Self-pay

## 2015-12-25 DIAGNOSIS — I10 Essential (primary) hypertension: Secondary | ICD-10-CM | POA: Diagnosis not present

## 2015-12-25 DIAGNOSIS — Z90710 Acquired absence of both cervix and uterus: Secondary | ICD-10-CM | POA: Diagnosis not present

## 2015-12-25 DIAGNOSIS — E668 Other obesity: Secondary | ICD-10-CM | POA: Diagnosis not present

## 2015-12-25 DIAGNOSIS — Z8541 Personal history of malignant neoplasm of cervix uteri: Secondary | ICD-10-CM | POA: Diagnosis not present

## 2015-12-25 DIAGNOSIS — F319 Bipolar disorder, unspecified: Secondary | ICD-10-CM | POA: Diagnosis not present

## 2015-12-25 DIAGNOSIS — J449 Chronic obstructive pulmonary disease, unspecified: Secondary | ICD-10-CM | POA: Diagnosis not present

## 2015-12-25 DIAGNOSIS — F17200 Nicotine dependence, unspecified, uncomplicated: Secondary | ICD-10-CM | POA: Diagnosis not present

## 2015-12-25 DIAGNOSIS — K430 Incisional hernia with obstruction, without gangrene: Secondary | ICD-10-CM | POA: Diagnosis present

## 2015-12-25 DIAGNOSIS — Z6835 Body mass index (BMI) 35.0-35.9, adult: Secondary | ICD-10-CM | POA: Diagnosis not present

## 2015-12-25 DIAGNOSIS — Z8543 Personal history of malignant neoplasm of ovary: Secondary | ICD-10-CM | POA: Diagnosis not present

## 2015-12-25 DIAGNOSIS — K219 Gastro-esophageal reflux disease without esophagitis: Secondary | ICD-10-CM | POA: Diagnosis not present

## 2015-12-25 DIAGNOSIS — Z79899 Other long term (current) drug therapy: Secondary | ICD-10-CM | POA: Diagnosis not present

## 2015-12-25 HISTORY — DX: Cardiac arrhythmia, unspecified: I49.9

## 2015-12-25 HISTORY — DX: Major depressive disorder, single episode, unspecified: F32.9

## 2015-12-25 HISTORY — DX: Depression, unspecified: F32.A

## 2015-12-25 HISTORY — DX: Sleep apnea, unspecified: G47.30

## 2015-12-25 LAB — COMPREHENSIVE METABOLIC PANEL
ALT: 26 U/L (ref 14–54)
AST: 20 U/L (ref 15–41)
Albumin: 3.6 g/dL (ref 3.5–5.0)
Alkaline Phosphatase: 79 U/L (ref 38–126)
Anion gap: 7 (ref 5–15)
BILIRUBIN TOTAL: 0.7 mg/dL (ref 0.3–1.2)
BUN: 9 mg/dL (ref 6–20)
CO2: 29 mmol/L (ref 22–32)
CREATININE: 0.97 mg/dL (ref 0.44–1.00)
Calcium: 9.2 mg/dL (ref 8.9–10.3)
Chloride: 106 mmol/L (ref 101–111)
Glucose, Bld: 111 mg/dL — ABNORMAL HIGH (ref 65–99)
POTASSIUM: 3.7 mmol/L (ref 3.5–5.1)
Sodium: 142 mmol/L (ref 135–145)
TOTAL PROTEIN: 7.2 g/dL (ref 6.5–8.1)

## 2015-12-25 LAB — CBC WITH DIFFERENTIAL/PLATELET
BASOS ABS: 0.1 10*3/uL (ref 0.0–0.1)
Basophils Relative: 1 %
EOS ABS: 0.3 10*3/uL (ref 0.0–0.7)
EOS PCT: 3 %
HEMATOCRIT: 44.5 % (ref 36.0–46.0)
Hemoglobin: 14.1 g/dL (ref 12.0–15.0)
LYMPHS ABS: 4.6 10*3/uL — AB (ref 0.7–4.0)
Lymphocytes Relative: 45 %
MCH: 29.7 pg (ref 26.0–34.0)
MCHC: 31.7 g/dL (ref 30.0–36.0)
MCV: 93.9 fL (ref 78.0–100.0)
MONOS PCT: 5 %
Monocytes Absolute: 0.5 10*3/uL (ref 0.1–1.0)
NEUTROS PCT: 46 %
Neutro Abs: 4.7 10*3/uL (ref 1.7–7.7)
Platelets: 343 10*3/uL (ref 150–400)
RBC: 4.74 MIL/uL (ref 3.87–5.11)
RDW: 13.3 % (ref 11.5–15.5)
WBC: 10.2 10*3/uL (ref 4.0–10.5)

## 2015-12-25 MED ORDER — CHLORHEXIDINE GLUCONATE 4 % EX LIQD: 1.0000 "application " | Freq: Once | CUTANEOUS | Status: DC

## 2015-12-25 MED ORDER — CEFAZOLIN SODIUM-DEXTROSE 2-4 GM/100ML-% IV SOLN
2.0000 g | INTRAVENOUS | Status: AC
  Administered 2015-12-26: 2 g via INTRAVENOUS
  Filled 2015-12-25: qty 100

## 2015-12-25 NOTE — Progress Notes (Signed)
Anesthesia Chart Review:  Pt is a 50 year old female scheduled for laparoscopic incisional hernia repair with mesh on 12/26/2015 with Dr. Dalbert Batman.   Cardiologist is Dr. Abran Richard (care everywhere), last office visit 11/12/15. At that time pt was experiencing chest discomfort. Dr. Otho Perl felt it was due to coronary vasospasm (as this had been the culprit previously) and restarted pt's amlodipine.   PCP is Eldridge Abrahams, FNP (care everywhere).   PMH includes:  HTN, OSA, asthma, polycystic kidney disease, hepatic cyst, bipolar disorder.   Medications include: albuterol, amlodipine, abilify, losartan-hctz, prilosec  Preoperative labs reviewed.    EKG 12/25/15: NSR with sinus arrhythmia. Left axis deviation.  LBBB.  LBBB present on EKGs dating back to 08/2014.   Echo 03/20/15:  - Left ventricle: The cavity size was normal. Wall thickness was increased in a pattern of mild LVH. Systolic function was normal. The estimated ejection fraction was in the range of 55% to 60%. Wall motion was normal; there were no regional wall motion abnormalities. Doppler parameters are consistent with abnormal left ventricular relaxation (grade 1 diastolic dysfunction). Doppler parameters are consistent with high ventricular filling pressure. - Ventricular septum: Septal motion showed abnormal function and dyssynergy. - Aortic valve: Mean gradient (S): 6 mm Hg. Valve area (VTI): 2.02cm^2. - Left atrium: The atrium was at the upper limits of normal in size. - Right atrium: Central venous pressure (est): 3 mm Hg. - Atrial septum: No defect or patent foramen ovale was identified. - Tricuspid valve: There was trivial regurgitation. - Pulmonary arteries: Systolic pressure could not be accurately estimated. - Pericardium, extracardiac: A prominent pericardial fat pad was present. - Impressions: Mild LVH with LVEF 55-60% and grade 1 diastolic dysfunction with increased filling pressures. Septal dyssynergy suggests underlying  conduction abnormality. Upper normal left atrial chamber size. Trivial tricuspid regurgitation. No obvious PFO or ASD.  By notes, pt had cardiac cath ~ 3-5 years ago showing normal coronaries except anomalous circumflex.   Juliann Pulse, PAT RN, spoke with Dr. Darrel Hoover office. Apparently his office has been trying to secure cardiac clearance from Dr. Otho Perl for surgery without success. Juliann Pulse was told surgery would be cancelled tomorrow if clearance didn't come through today. I can see in care everywhere that Dr. Sudie Grumbling office has been contacted for clearance. I have limited information available in our system for review. It's not clear if pt's LBBB has ever been worked up. If she has cardiac clearance and surgery proceeds, pt will need further assessment by her assigned anesthesiologist DOS.   Willeen Cass, FNP-BC [facility] Stay [facility]/Anesthesiology Phone: [phone] 12/25/2015 2:56 PM

## 2015-12-26 ENCOUNTER — Observation Stay (HOSPITAL_COMMUNITY)
Admission: RE | Admit: 2015-12-26 | Discharge: 2015-12-28 | Disposition: A | Discharge status: Home / Self Care | Payer: Medicare Other | Source: Ambulatory Visit | Attending: General Surgery | Admitting: General Surgery

## 2015-12-26 ENCOUNTER — Encounter (HOSPITAL_COMMUNITY): Payer: Self-pay | Admitting: *Deleted

## 2015-12-26 ENCOUNTER — Ambulatory Visit (HOSPITAL_COMMUNITY): Payer: Medicare Other | Admitting: Emergency Medicine

## 2015-12-26 ENCOUNTER — Ambulatory Visit (HOSPITAL_COMMUNITY): Payer: Medicare Other | Admitting: Certified Registered Nurse Anesthetist

## 2015-12-26 ENCOUNTER — Encounter (HOSPITAL_COMMUNITY)
Admission: RE | Disposition: A | Discharge status: Home / Self Care | Payer: Self-pay | Source: Ambulatory Visit | Attending: General Surgery

## 2015-12-26 DIAGNOSIS — Z79899 Other long term (current) drug therapy: Secondary | ICD-10-CM | POA: Insufficient documentation

## 2015-12-26 DIAGNOSIS — Z90710 Acquired absence of both cervix and uterus: Secondary | ICD-10-CM | POA: Insufficient documentation

## 2015-12-26 DIAGNOSIS — E668 Other obesity: Secondary | ICD-10-CM | POA: Insufficient documentation

## 2015-12-26 DIAGNOSIS — I10 Essential (primary) hypertension: Secondary | ICD-10-CM | POA: Diagnosis not present

## 2015-12-26 DIAGNOSIS — K430 Incisional hernia with obstruction, without gangrene: Principal | ICD-10-CM | POA: Insufficient documentation

## 2015-12-26 DIAGNOSIS — Z8541 Personal history of malignant neoplasm of cervix uteri: Secondary | ICD-10-CM | POA: Insufficient documentation

## 2015-12-26 DIAGNOSIS — Z6835 Body mass index (BMI) 35.0-35.9, adult: Secondary | ICD-10-CM | POA: Insufficient documentation

## 2015-12-26 DIAGNOSIS — F17200 Nicotine dependence, unspecified, uncomplicated: Secondary | ICD-10-CM | POA: Insufficient documentation

## 2015-12-26 DIAGNOSIS — K431 Incisional hernia with gangrene: Secondary | ICD-10-CM

## 2015-12-26 DIAGNOSIS — J449 Chronic obstructive pulmonary disease, unspecified: Secondary | ICD-10-CM | POA: Insufficient documentation

## 2015-12-26 DIAGNOSIS — K219 Gastro-esophageal reflux disease without esophagitis: Secondary | ICD-10-CM | POA: Insufficient documentation

## 2015-12-26 DIAGNOSIS — Z8543 Personal history of malignant neoplasm of ovary: Secondary | ICD-10-CM | POA: Insufficient documentation

## 2015-12-26 DIAGNOSIS — F319 Bipolar disorder, unspecified: Secondary | ICD-10-CM | POA: Insufficient documentation

## 2015-12-26 HISTORY — DX: Chronic obstructive pulmonary disease, unspecified: J44.9

## 2015-12-26 HISTORY — DX: Personal history of other diseases of the digestive system: Z87.19

## 2015-12-26 HISTORY — DX: Other chronic pain: G89.29

## 2015-12-26 HISTORY — DX: Unspecified chronic bronchitis: J42

## 2015-12-26 HISTORY — DX: Gastro-esophageal reflux disease without esophagitis: K21.9

## 2015-12-26 HISTORY — DX: Calculus of kidney: N20.0

## 2015-12-26 HISTORY — DX: Pure hypercholesterolemia, unspecified: E78.00

## 2015-12-26 HISTORY — DX: Malignant neoplasm of unspecified ovary: C56.9

## 2015-12-26 HISTORY — DX: Incisional hernia with gangrene: K43.1

## 2015-12-26 HISTORY — DX: Malignant neoplasm of cervix uteri, unspecified: C53.9

## 2015-12-26 HISTORY — PX: INSERTION OF MESH: SHX5868

## 2015-12-26 HISTORY — PX: LAPAROSCOPIC INCISIONAL / UMBILICAL / VENTRAL HERNIA REPAIR: SUR789

## 2015-12-26 HISTORY — DX: Unspecified osteoarthritis, unspecified site: M19.90

## 2015-12-26 HISTORY — DX: Malignant melanoma of other part of trunk: C43.59

## 2015-12-26 HISTORY — DX: Dorsalgia, unspecified: M54.9

## 2015-12-26 HISTORY — DX: Anxiety disorder, unspecified: F41.9

## 2015-12-26 HISTORY — PX: INCISIONAL HERNIA REPAIR: SHX193

## 2015-12-26 SURGERY — REPAIR, HERNIA, INCISIONAL, LAPAROSCOPIC
Anesthesia: General | Site: Abdomen

## 2015-12-26 MED ORDER — ONDANSETRON 4 MG PO TBDP: 4.0000 mg | ORAL_TABLET | Freq: Four times a day (QID) | ORAL | Status: DC | PRN

## 2015-12-26 MED ORDER — LOSARTAN POTASSIUM-HCTZ 50-12.5 MG PO TABS: 1.0000 | ORAL_TABLET | Freq: Two times a day (BID) | ORAL | Status: DC

## 2015-12-26 MED ORDER — PROPOFOL 10 MG/ML IV BOLUS
INTRAVENOUS | Status: DC | PRN
  Administered 2015-12-26: 140 mg via INTRAVENOUS
  Administered 2015-12-26: 20 mg via INTRAVENOUS

## 2015-12-26 MED ORDER — SENNA 8.6 MG PO TABS
2.0000 | ORAL_TABLET | Freq: Two times a day (BID) | ORAL | Status: DC
  Administered 2015-12-26 – 2015-12-28 (×4): 17.2 mg via ORAL
  Filled 2015-12-26 (×4): qty 2

## 2015-12-26 MED ORDER — FENTANYL CITRATE (PF) 100 MCG/2ML IJ SOLN
INTRAMUSCULAR | Status: DC | PRN
  Administered 2015-12-26 (×2): 50 ug via INTRAVENOUS
  Administered 2015-12-26: 150 ug via INTRAVENOUS
  Administered 2015-12-26: 50 ug via INTRAVENOUS

## 2015-12-26 MED ORDER — HYDROCHLOROTHIAZIDE 12.5 MG PO CAPS
12.5000 mg | ORAL_CAPSULE | Freq: Two times a day (BID) | ORAL | Status: DC
  Administered 2015-12-26 – 2015-12-28 (×4): 12.5 mg via ORAL
  Filled 2015-12-26 (×4): qty 1

## 2015-12-26 MED ORDER — ARIPIPRAZOLE 5 MG PO TABS
5.0000 mg | ORAL_TABLET | Freq: Every day | ORAL | Status: DC
  Administered 2015-12-27 – 2015-12-28 (×2): 5 mg via ORAL
  Filled 2015-12-26 (×2): qty 1

## 2015-12-26 MED ORDER — ONDANSETRON HCL 4 MG/2ML IJ SOLN
INTRAMUSCULAR | Status: AC
  Filled 2015-12-26: qty 2

## 2015-12-26 MED ORDER — SUCCINYLCHOLINE CHLORIDE 20 MG/ML IJ SOLN
INTRAMUSCULAR | Status: DC | PRN
  Administered 2015-12-26: 60 mg via INTRAVENOUS

## 2015-12-26 MED ORDER — FENTANYL CITRATE (PF) 100 MCG/2ML IJ SOLN
INTRAMUSCULAR | Status: AC
  Administered 2015-12-26: 50 ug via INTRAVENOUS
  Filled 2015-12-26: qty 2

## 2015-12-26 MED ORDER — BUPIVACAINE-EPINEPHRINE (PF) 0.25% -1:200000 IJ SOLN
INTRAMUSCULAR | Status: AC
  Filled 2015-12-26: qty 30

## 2015-12-26 MED ORDER — SUGAMMADEX SODIUM 200 MG/2ML IV SOLN
INTRAVENOUS | Status: DC | PRN
  Administered 2015-12-26: 200 mg via INTRAVENOUS

## 2015-12-26 MED ORDER — OXYCODONE HCL 5 MG PO TABS
ORAL_TABLET | ORAL | Status: AC
  Administered 2015-12-26: 10 mg via ORAL
  Filled 2015-12-26: qty 2

## 2015-12-26 MED ORDER — HYDROMORPHONE HCL 1 MG/ML IJ SOLN
1.0000 mg | INTRAMUSCULAR | Status: DC | PRN
  Administered 2015-12-26 – 2015-12-27 (×6): 1 mg via INTRAVENOUS
  Filled 2015-12-26 (×5): qty 1

## 2015-12-26 MED ORDER — POTASSIUM CHLORIDE 2 MEQ/ML IV SOLN
INTRAVENOUS | Status: DC
  Administered 2015-12-26: 21:00:00 via INTRAVENOUS
  Filled 2015-12-26 (×6): qty 1000

## 2015-12-26 MED ORDER — OXYCODONE HCL 5 MG/5ML PO SOLN: 5.0000 mg | Freq: Once | ORAL | Status: DC | PRN

## 2015-12-26 MED ORDER — METHOCARBAMOL 500 MG PO TABS
500.0000 mg | ORAL_TABLET | Freq: Four times a day (QID) | ORAL | Status: DC | PRN
  Filled 2015-12-26: qty 1

## 2015-12-26 MED ORDER — BUPIVACAINE-EPINEPHRINE 0.25% -1:200000 IJ SOLN
INTRAMUSCULAR | Status: DC | PRN
Start: 2015-12-26 — End: 2015-12-26
  Administered 2015-12-26: 25 mL

## 2015-12-26 MED ORDER — OXYCODONE HCL 5 MG PO TABS: 5.0000 mg | ORAL_TABLET | Freq: Once | ORAL | Status: DC | PRN

## 2015-12-26 MED ORDER — POLYETHYLENE GLYCOL 3350 17 G PO PACK
17.0000 g | PACK | Freq: Every day | ORAL | Status: DC | PRN
Start: 2015-12-26 — End: 2015-12-28

## 2015-12-26 MED ORDER — FENTANYL CITRATE (PF) 100 MCG/2ML IJ SOLN
25.0000 ug | INTRAMUSCULAR | Status: DC | PRN
  Administered 2015-12-26 (×2): 50 ug via INTRAVENOUS

## 2015-12-26 MED ORDER — HYDROMORPHONE HCL 1 MG/ML IJ SOLN
INTRAMUSCULAR | Status: AC
  Administered 2015-12-26: 1 mg via INTRAVENOUS
  Filled 2015-12-26: qty 1

## 2015-12-26 MED ORDER — OXYCODONE HCL 5 MG PO TABS
5.0000 mg | ORAL_TABLET | ORAL | Status: DC | PRN
  Administered 2015-12-26: 10 mg via ORAL

## 2015-12-26 MED ORDER — ROCURONIUM BROMIDE 100 MG/10ML IV SOLN
INTRAVENOUS | Status: DC | PRN
Start: 2015-12-26 — End: 2015-12-26
  Administered 2015-12-26: 50 mg via INTRAVENOUS

## 2015-12-26 MED ORDER — 0.9 % SODIUM CHLORIDE (POUR BTL) OPTIME
TOPICAL | Status: DC | PRN
  Administered 2015-12-26: 1000 mL

## 2015-12-26 MED ORDER — LIDOCAINE 2% (20 MG/ML) 5 ML SYRINGE
INTRAMUSCULAR | Status: AC
  Filled 2015-12-26: qty 5

## 2015-12-26 MED ORDER — ONDANSETRON HCL 4 MG/2ML IJ SOLN
INTRAMUSCULAR | Status: DC | PRN
  Administered 2015-12-26: 4 mg via INTRAVENOUS

## 2015-12-26 MED ORDER — ACETAMINOPHEN 325 MG PO TABS: 325.0000 mg | ORAL_TABLET | ORAL | Status: DC | PRN

## 2015-12-26 MED ORDER — FENTANYL CITRATE (PF) 250 MCG/5ML IJ SOLN
INTRAMUSCULAR | Status: AC
  Filled 2015-12-26: qty 5

## 2015-12-26 MED ORDER — ALBUTEROL SULFATE (2.5 MG/3ML) 0.083% IN NEBU: 3.0000 mL | INHALATION_SOLUTION | Freq: Four times a day (QID) | RESPIRATORY_TRACT | Status: DC | PRN

## 2015-12-26 MED ORDER — LABETALOL HCL 5 MG/ML IV SOLN
INTRAVENOUS | Status: AC
  Filled 2015-12-26: qty 4

## 2015-12-26 MED ORDER — ADULT MULTIVITAMIN W/MINERALS CH
1.0000 | ORAL_TABLET | Freq: Every day | ORAL | Status: DC
  Administered 2015-12-27 – 2015-12-28 (×2): 1 via ORAL
  Filled 2015-12-26 (×2): qty 1

## 2015-12-26 MED ORDER — LIDOCAINE HCL (CARDIAC) 20 MG/ML IV SOLN
INTRAVENOUS | Status: DC | PRN
  Administered 2015-12-26: 60 mg via INTRAVENOUS

## 2015-12-26 MED ORDER — ACETAMINOPHEN 160 MG/5ML PO SOLN
325.0000 mg | ORAL | Status: DC | PRN
  Filled 2015-12-26: qty 20.3

## 2015-12-26 MED ORDER — SUGAMMADEX SODIUM 200 MG/2ML IV SOLN
INTRAVENOUS | Status: AC
  Filled 2015-12-26: qty 2

## 2015-12-26 MED ORDER — SUCCINYLCHOLINE CHLORIDE 200 MG/10ML IV SOSY
PREFILLED_SYRINGE | INTRAVENOUS | Status: AC
  Filled 2015-12-26: qty 10

## 2015-12-26 MED ORDER — ONDANSETRON HCL 4 MG/2ML IJ SOLN
4.0000 mg | Freq: Four times a day (QID) | INTRAMUSCULAR | Status: DC | PRN
  Administered 2015-12-26 – 2015-12-27 (×2): 4 mg via INTRAVENOUS
  Filled 2015-12-26 (×2): qty 2

## 2015-12-26 MED ORDER — LACTATED RINGERS IV SOLN
INTRAVENOUS | Status: DC
  Administered 2015-12-26 (×2): via INTRAVENOUS

## 2015-12-26 MED ORDER — AMLODIPINE BESYLATE 5 MG PO TABS
5.0000 mg | ORAL_TABLET | Freq: Every day | ORAL | Status: DC
  Administered 2015-12-27 – 2015-12-28 (×2): 5 mg via ORAL
  Filled 2015-12-26 (×2): qty 1

## 2015-12-26 MED ORDER — PROPOFOL 10 MG/ML IV BOLUS
INTRAVENOUS | Status: AC
  Filled 2015-12-26: qty 20

## 2015-12-26 MED ORDER — LABETALOL HCL 5 MG/ML IV SOLN
INTRAVENOUS | Status: DC | PRN
  Administered 2015-12-26 (×3): 5 mg via INTRAVENOUS

## 2015-12-26 MED ORDER — ROCURONIUM BROMIDE 50 MG/5ML IV SOLN
INTRAVENOUS | Status: AC
  Filled 2015-12-26: qty 1

## 2015-12-26 MED ORDER — ENOXAPARIN SODIUM 40 MG/0.4ML ~~LOC~~ SOLN
40.0000 mg | SUBCUTANEOUS | Status: DC
  Administered 2015-12-27 – 2015-12-28 (×2): 40 mg via SUBCUTANEOUS
  Filled 2015-12-26 (×2): qty 0.4

## 2015-12-26 MED ORDER — MULTIVITAMIN GUMMIES ADULT PO CHEW: CHEWABLE_TABLET | ORAL | Status: DC

## 2015-12-26 MED ORDER — ACETAMINOPHEN 325 MG PO TABS: 650.0000 mg | ORAL_TABLET | Freq: Four times a day (QID) | ORAL | Status: DC | PRN

## 2015-12-26 MED ORDER — PANTOPRAZOLE SODIUM 40 MG PO TBEC
40.0000 mg | DELAYED_RELEASE_TABLET | Freq: Every day | ORAL | Status: DC
  Administered 2015-12-26 – 2015-12-28 (×3): 40 mg via ORAL
  Filled 2015-12-26 (×3): qty 1

## 2015-12-26 MED ORDER — BUPROPION HCL ER (XL) 300 MG PO TB24
300.0000 mg | ORAL_TABLET | Freq: Every day | ORAL | Status: DC
  Administered 2015-12-27 – 2015-12-28 (×2): 300 mg via ORAL
  Filled 2015-12-26 (×2): qty 1

## 2015-12-26 MED ORDER — FENTANYL CITRATE (PF) 250 MCG/5ML IJ SOLN
INTRAMUSCULAR | Status: AC
Start: 2015-12-26 — End: 2015-12-26
  Filled 2015-12-26: qty 5

## 2015-12-26 MED ORDER — BISACODYL 5 MG PO TBEC: 5.0000 mg | DELAYED_RELEASE_TABLET | Freq: Every day | ORAL | Status: DC | PRN

## 2015-12-26 MED ORDER — MIDAZOLAM HCL 2 MG/2ML IJ SOLN
INTRAMUSCULAR | Status: AC
  Filled 2015-12-26: qty 2

## 2015-12-26 MED ORDER — ACETAMINOPHEN 650 MG RE SUPP: 650.0000 mg | Freq: Four times a day (QID) | RECTAL | Status: DC | PRN

## 2015-12-26 MED ORDER — CEFAZOLIN SODIUM-DEXTROSE 2-4 GM/100ML-% IV SOLN
2.0000 g | Freq: Three times a day (TID) | INTRAVENOUS | Status: AC
  Administered 2015-12-26: 2 g via INTRAVENOUS
  Filled 2015-12-26: qty 100

## 2015-12-26 MED ORDER — METHOCARBAMOL 500 MG PO TABS
ORAL_TABLET | ORAL | Status: AC
  Filled 2015-12-26: qty 1

## 2015-12-26 MED ORDER — LOSARTAN POTASSIUM 50 MG PO TABS
50.0000 mg | ORAL_TABLET | Freq: Two times a day (BID) | ORAL | Status: DC
  Administered 2015-12-26 – 2015-12-28 (×4): 50 mg via ORAL
  Filled 2015-12-26 (×4): qty 1

## 2015-12-26 SURGICAL SUPPLY — 49 items
APPLIER CLIP LOGIC TI 5 (MISCELLANEOUS) IMPLANT
BLADE SURG ROTATE 9660 (MISCELLANEOUS) IMPLANT
CANISTER SUCTION 2500CC (MISCELLANEOUS) IMPLANT
CHLORAPREP W/TINT 26ML (MISCELLANEOUS) ×2 IMPLANT
COVER SURGICAL LIGHT HANDLE (MISCELLANEOUS) ×2 IMPLANT
DECANTER SPIKE VIAL GLASS SM (MISCELLANEOUS) ×2 IMPLANT
DERMABOND ADVANCED (GAUZE/BANDAGES/DRESSINGS) ×1
DERMABOND ADVANCED .7 DNX12 (GAUZE/BANDAGES/DRESSINGS) ×1 IMPLANT
DEVICE SECURE STRAP 25 ABSORB (INSTRUMENTS) ×8 IMPLANT
DEVICE TROCAR PUNCTURE CLOSURE (ENDOMECHANICALS) ×2 IMPLANT
DRAPE LAPAROSCOPIC ABDOMINAL (DRAPES) ×2 IMPLANT
ELECT REM PT RETURN 9FT ADLT (ELECTROSURGICAL) ×2
ELECTRODE REM PT RTRN 9FT ADLT (ELECTROSURGICAL) ×1 IMPLANT
GAUZE SPONGE 2X2 8PLY STRL LF (GAUZE/BANDAGES/DRESSINGS) ×1 IMPLANT
GLOVE BIOGEL PI IND STRL 7.0 (GLOVE) ×1 IMPLANT
GLOVE BIOGEL PI IND STRL 8 (GLOVE) ×1 IMPLANT
GLOVE BIOGEL PI INDICATOR 7.0 (GLOVE) ×1
GLOVE BIOGEL PI INDICATOR 8 (GLOVE) ×1
GLOVE ECLIPSE 7.0 STRL STRAW (GLOVE) ×2 IMPLANT
GLOVE ECLIPSE 7.5 STRL STRAW (GLOVE) ×2 IMPLANT
GLOVE EUDERMIC 7 POWDERFREE (GLOVE) ×2 IMPLANT
GOWN STRL REUS W/ TWL LRG LVL3 (GOWN DISPOSABLE) ×2 IMPLANT
GOWN STRL REUS W/ TWL XL LVL3 (GOWN DISPOSABLE) ×1 IMPLANT
GOWN STRL REUS W/TWL LRG LVL3 (GOWN DISPOSABLE) ×2
GOWN STRL REUS W/TWL XL LVL3 (GOWN DISPOSABLE) ×1
KIT BASIN OR (CUSTOM PROCEDURE TRAY) ×2 IMPLANT
KIT ROOM TURNOVER OR (KITS) ×2 IMPLANT
MARKER SKIN DUAL TIP RULER LAB (MISCELLANEOUS) ×2 IMPLANT
MESH VENTRALIGHT ST 6X8 (Mesh Specialty) ×1 IMPLANT
MESH VENTRLGHT ELLIPSE 8X6XMFL (Mesh Specialty) ×1 IMPLANT
NEEDLE SPNL 22GX3.5 QUINCKE BK (NEEDLE) ×2 IMPLANT
NS IRRIG 1000ML POUR BTL (IV SOLUTION) ×2 IMPLANT
PAD ARMBOARD 7.5X6 YLW CONV (MISCELLANEOUS) ×4 IMPLANT
SCALPEL HARMONIC ACE (MISCELLANEOUS) IMPLANT
SCISSORS LAP 5X35 DISP (ENDOMECHANICALS) ×2 IMPLANT
SET IRRIG TUBING LAPAROSCOPIC (IRRIGATION / IRRIGATOR) IMPLANT
SLEEVE ENDOPATH XCEL 5M (ENDOMECHANICALS) ×4 IMPLANT
SPONGE GAUZE 2X2 STER 10/PKG (GAUZE/BANDAGES/DRESSINGS) ×1
STAPLER VISISTAT 35W (STAPLE) ×2 IMPLANT
SUT MON AB 4-0 PC3 18 (SUTURE) ×2 IMPLANT
SUT NOVA NAB DX-16 0-1 5-0 T12 (SUTURE) ×4 IMPLANT
TAPE CLOTH SURG 4X10 WHT LF (GAUZE/BANDAGES/DRESSINGS) ×2 IMPLANT
TOWEL OR 17X24 6PK STRL BLUE (TOWEL DISPOSABLE) ×2 IMPLANT
TOWEL OR 17X26 10 PK STRL BLUE (TOWEL DISPOSABLE) IMPLANT
TRAY FOLEY CATH 16FR SILVER (SET/KITS/TRAYS/PACK) IMPLANT
TRAY LAPAROSCOPIC MC (CUSTOM PROCEDURE TRAY) ×2 IMPLANT
TROCAR XCEL NON-BLD 11X100MML (ENDOMECHANICALS) ×2 IMPLANT
TROCAR XCEL NON-BLD 5MMX100MML (ENDOMECHANICALS) ×2 IMPLANT
TUBING INSUFFLATION (TUBING) ×2 IMPLANT

## 2015-12-26 NOTE — Anesthesia Preprocedure Evaluation (Signed)
Anesthesia Evaluation  Patient identified by MRN, date of birth, ID band Patient awake    Reviewed: Allergy & Precautions, NPO status , Patient's Chart, lab work & pertinent test results  History of Anesthesia Complications Negative for: history of anesthetic complications  Airway Mallampati: III  TM Distance: >3 FB Neck ROM: Full    Dental  (+) Edentulous Upper, Edentulous Lower   Pulmonary asthma , sleep apnea , Current Smoker,    breath sounds clear to auscultation       Cardiovascular hypertension, Pt. on medications (-) angina(-) Past MI and (-) CHF  Rhythm:Regular     Neuro/Psych Depression Bipolar Disorder TIA Neuromuscular disease    GI/Hepatic Neg liver ROS, GERD  Medicated and Controlled,  Endo/Other  Morbid obesity  Renal/GU negative Renal ROS     Musculoskeletal  (+) Arthritis , Fibromyalgia -  Abdominal   Peds  Hematology negative hematology ROS (+)   Anesthesia Other Findings   Reproductive/Obstetrics                             Anesthesia Physical Anesthesia Plan  ASA: III  Anesthesia Plan: General   Post-op Pain Management:    Induction: Intravenous  Airway Management Planned: Oral ETT  Additional Equipment: None  Intra-op Plan:   Post-operative Plan: Extubation in OR  Informed Consent: I have reviewed the patients History and Physical, chart, labs and discussed the procedure including the risks, benefits and alternatives for the proposed anesthesia with the patient or authorized representative who has indicated his/her understanding and acceptance.   Dental advisory given  Plan Discussed with: CRNA and Surgeon  Anesthesia Plan Comments:         Anesthesia Quick Evaluation

## 2015-12-26 NOTE — Transfer of Care (Signed)
Immediate Anesthesia Transfer of Care Note  Patient: [name]  Procedure(s) Performed: Procedure(s): LAPAROSCOPIC REPAIR INCISIONAL HERNIA WITH MESH (N/A) INSERTION OF MESH (N/A)  Patient Location: PACU  Anesthesia Type:General  Level of Consciousness: awake, alert , oriented and patient cooperative  Airway & Oxygen Therapy: Patient Spontanous Breathing and Patient connected to face mask oxygen  Post-op Assessment: Report given to RN and Post -op Vital signs reviewed and stable  Post vital signs: Reviewed and stable  Last Vitals:  Filed Vitals:   12/26/15 1143  BP: 166/82  Pulse: 92  Temp: 36.8 C  Resp: 18    Last Pain:  Filed Vitals:   12/26/15 1200  PainSc: 7       Patients Stated Pain Goal: 3 ([id number])  Complications: No apparent anesthesia complications

## 2015-12-26 NOTE — Progress Notes (Signed)
Orthopedic Tech Progress Note Patient Details:  [name] 04-13-1966 [id number]  Ortho Devices Type of Ortho Device: Abdominal binder Ortho Device/Splint Location: bedside Ortho Device/Splint Interventions: Criss Alvine 12/26/2015, 6:28 PM

## 2015-12-26 NOTE — Interval H&P Note (Signed)
History and Physical Interval Note:  12/26/2015 2:02 PM  [name]  has presented today for surgery, with the diagnosis of Incisional hernia   The various methods of treatment have been discussed with the patient and family. After consideration of risks, benefits and other options for treatment, the patient has consented to  Procedure(s): [place] (N/A) INSERTION OF MESH (N/A) POSSIBLE OPEN REPAIR as a surgical intervention .  The patient's history has been reviewed, patient examined, no change in status, stable for surgery.  I have reviewed the patient's chart and labs.  Questions were answered to the patient's satisfaction.     Adin Hector

## 2015-12-26 NOTE — Op Note (Signed)
Patient Name:           [name]   Date of Surgery:        12/26/2015  Pre op Diagnosis:      Incarcerated incisional hernia  Post op Diagnosis:    Incarcerated incisional hernia  Procedure:                 Laparoscopic repair of incarcerated incisional hernia with mesh  Surgeon:                     Edsel Petrin. Dalbert Batman, M.D., FACS  Assistant:                      Or staff  Operative Indications:    This is a 50 year old Caucasian female, referred by Eldridge Abrahams, FNP for evaluation of a symptomatic incisional hernia in the periumbilical area. Tammy Luciana Axe is her primary care physician. She is also followed at behavioral health for her bipolar disorder.  The patient has a past history of open cholecystectomy in 1987, through a right subcostal incision. She had a diagnostic laparoscopy in 1997 through a 4 cm transverse infraumbilical incision.  , leading to a diagnosis of ovarian cancer. She had a total abdominal hysterectomy and bilateral salpingo-oophorectomy through a Pfannenstiel incision in [place] by her gynecologist. It does not sound she had omentectomy or lymph node dissection. She says she had stage I cervical cancer and stage II ovarian cancer. No known recurrence. No details available.  Not followed regularly.  She gives a three-year history of intermittent pain and intermittent bulging at her umbilicus. This is getting a little bit worse. Beginning to interfere with her daily activities. She would like something done electively.  Comorbidities include bipolar disorder, very stable on medication. Cigarette smoker. Says she smokes 5-6 cigarettes a day. Chronic fatigue. Chronic anxiety. Status post open cholecystectomy. Status post TAH and BSO. Benign hypertension. GERD. Asthma.  Social history reveals she lives in [place]. He is single but has a boyfriend. Has 2 children that live in [place]. Does smoke but denies alcohol.  Disabled because of bipolar disorder.  She wanted to go ahead with elective surgery. She'll be scheduled for laparoscopic repair of her incarcerated incisional hernia with mesh, possible open repair.She agrees with this plan.   Operative Findings:       There was a 3 cm or slightly larger hernia defect in the periumbilical area related to her transverse infraumbilical incision.  There was a large tongue of omentum incarcerated in this which had to be debrided.  There was no evidence of intestinal incarceration.  There was no evidence of malignancy within the abdomen.  No nodules or peritoneal disease.  There were some chronic adhesions of the liver to the undersurface of the diaphragm which were taken down.  The liver appeared healthy.  The hernia was repaired laparoscopically with a 20 cm x 15 cm piece of ventra-light ST composite mesh.  Procedure in Detail:          Following the induction of general endotracheal anesthesia a Foley catheter was inserted.  The abdomen was prepped and draped in a sterile fashion.  Surgical timeout was performed.  Intravenous antibiotics were given.  A 5 mm optical trocar was inserted in the left subcostal region uneventfully.  There were no adhesions in this area.  Entry was atraumatic.  Pneumoperitoneum was created and video camera inserted.  Ultimately I placed an additional 5  mm trocar in the left lower quadrant and an 11 mm trocar in the left lateral abdomen.  I moved the 5 mm trochars to the right side near the end of the case for inspection and further application of secure strap tacks.     With traction and sharp scissor dissection I debrided the omentum out of the hernia defect.  There was no bleeding.  There is no sign of intestinal involvement.  Using a spinal needle and a marking pin I marked the limits of the defect and found that if I used a 15 cm transverse by 20 cm vertical piece of mesh I could achieve a 6 or 7 cm overlap in all directions.  This mesh  was brought to the operative field.  Using a marking pen I drew a template on the abdominal wall for 6 suture fixation sites.  I used #1 Novafil for suture fixation and placed those in the rough side of the mesh.  The mesh was then moistened with saline, rolled up and inserted.  I opened the mesh up and was very careful to keep the rough side up toward the abdominal wall and the smooth side down toward the viscera.  At each of the designated suture fixation sites I made a small puncture wound and using an Endo Close device passed the sutures up through the abdominal wall, being careful to take a 1 cm bite of tissue.  After all of the suture fixations were placed I lifted the mesh up and it deployed nicely with almost no redundancy.  All 6 sutures were then tied.  I then used a secure strap device.  I secured the mesh in a double crown technique being careful to place the tacks no more than 1 cm apart.  Extra tacks were placed around the suture fixation sites.  This provided very secure fixation.  I inspected from both sides 2 or 3 times and found no defects.  There was no bleeding.  The omentum looked normal and there was no bleeding.  The liver appeared normal.    The trochars were removed and the pneumoperitoneum was released.  Skin staples were used to close the trocar sites.  Small bandages were placed.  The Foley catheter was removed.  The patient tolerated the procedure well was taken to PACU in stable condition.  EBL 20 mL or less.  Counts correct.  Complications none.     Edsel Petrin. Dalbert Batman, M.D., FACS General and Minimally Invasive Surgery Breast and Colorectal Surgery  12/26/2015 4:22 PM

## 2015-12-26 NOTE — Anesthesia Procedure Notes (Signed)
Procedure Name: Intubation Date/Time: 12/26/2015 2:54 PM Performed by: Willeen Cass P Pre-anesthesia Checklist: Patient identified, Timeout performed, Emergency Drugs available, Suction available and Patient being monitored Patient Re-evaluated:Patient Re-evaluated prior to inductionOxygen Delivery Method: Circle system utilized Preoxygenation: Pre-oxygenation with 100% oxygen Intubation Type: IV induction Ventilation: Oral airway inserted - appropriate to patient size and Mask ventilation without difficulty Laryngoscope Size: Mac and 3 Grade View: Grade I Tube type: Oral Tube size: 7.5 mm Number of attempts: 1 Airway Equipment and Method: Stylet Placement Confirmation: ETT inserted through vocal cords under direct vision,  breath sounds checked- equal and bilateral and positive ETCO2 Secured at: 22 cm Tube secured with: Tape Dental Injury: Teeth and Oropharynx as per pre-operative assessment  Comments: Pt in reverse trendelenburg with shoulder roll in place for induction.  Positioned to patient comfort.

## 2015-12-27 ENCOUNTER — Encounter (HOSPITAL_COMMUNITY): Payer: Self-pay | Admitting: General Surgery

## 2015-12-27 DIAGNOSIS — K430 Incisional hernia with obstruction, without gangrene: Secondary | ICD-10-CM | POA: Diagnosis not present

## 2015-12-27 MED ORDER — SENNA 8.6 MG PO TABS: 2.0000 | ORAL_TABLET | Freq: Two times a day (BID) | ORAL | Status: DC

## 2015-12-27 MED ORDER — HYDROCODONE-ACETAMINOPHEN 5-325 MG PO TABS
1.0000 | ORAL_TABLET | ORAL | Status: DC | PRN
  Administered 2015-12-27 – 2015-12-28 (×2): 2 via ORAL
  Filled 2015-12-27 (×2): qty 2

## 2015-12-27 NOTE — Care Management Obs Status (Signed)
[facility] NOTIFICATION   Patient Details  Name: JAMILAH HAJJ MRN: [id number] Date of Birth: 20-Oct-1965   Medicare Observation Status Notification Given:  Yes (Medicare observation procedure.)    Marilu Favre, RN 12/27/2015, 9:53 AM

## 2015-12-27 NOTE — Progress Notes (Signed)
1 Day Post-Op  Subjective: Stable and alert.  Says she has some incisional pain but doing well otherwise. Transient nausea yesterday. Voiding very well No cough or respiratory problems. Operative events explained to patient  Objective: Vital signs in last 24 hours: Temp:  [97.9 F (36.6 C)-99.8 F (37.7 C)] 99.8 F (37.7 C) (06/01 0605) Pulse Rate:  [65-92] 85 (06/01 0605) Resp:  [12-18] 17 (06/01 0605) BP: (105-166)/(53-85) 114/59 mmHg (06/01 0605) SpO2:  [94 %-99 %] 96 % (06/01 0605) Weight:  [84.369 kg (186 lb)] 84.369 kg (186 lb) (05/31 1805)    Intake/Output from previous day: 05/31 0701 - 06/01 0700 In: 1350 [P.O.:150; I.V.:1200] Out: 60 [Urine:40; Blood:20] Intake/Output this shift:    General appearance: Alert.  Cooperative.  In no distress. Resp: clear to auscultation bilaterally GI: Soft.  Wounds look fine.  Not distended.  Minimally tender.  Benign postop exam.  Lab Results:  No results found for this or any previous visit (from the past 24 hour(s)).   Studies/Results: No results found.  Marland Kitchen amLODipine  5 mg Oral Daily  . ARIPiprazole  5 mg Oral Daily  . buPROPion  300 mg Oral Daily  . enoxaparin (LOVENOX) injection  40 mg Subcutaneous Q24H  . hydrochlorothiazide  12.5 mg Oral BID  . losartan  50 mg Oral BID  . multivitamin with minerals  1 tablet Oral Daily  . pantoprazole  40 mg Oral Daily  . senna  2 tablet Oral BID  . senna  2 tablet Oral BID     Assessment/Plan: s/p Procedure(s): LAPAROSCOPIC REPAIR INCISIONAL HERNIA WITH MESH INSERTION OF MESH  POD #1.  (14 hours post op).Marland KitchenMarland KitchenLaparoscopic repair of incarcerated incisional hernia with mesh. Stable Try to advance diet and activities Decrease IV rate Transition to oral analgesics Home tomorrow if ambulatory and tolerating diet.  Bipolar disorder.  Continue current medications BMI 35 DVT prophylaxis.  Lovenox and SCDs Tobacco abuse.  Encourage I.S.  and abstinence   hypertension Asthma Chronic GERD History TAH and BSO.    @PROBHOSP @  LOS: 1 day    Mervin Ramires M 12/27/2015  . .prob

## 2015-12-27 NOTE — Anesthesia Postprocedure Evaluation (Signed)
Anesthesia Post Note  Patient: [name]  Procedure(s) Performed: Procedure(s) (LRB): LAPAROSCOPIC REPAIR INCISIONAL HERNIA WITH MESH (N/A) INSERTION OF MESH (N/A)  Patient location during evaluation: PACU Anesthesia Type: General Level of consciousness: awake Pain management: pain level controlled Vital Signs Assessment: post-procedure vital signs reviewed and stable Respiratory status: spontaneous breathing Cardiovascular status: stable Postop Assessment: no signs of nausea or vomiting Anesthetic complications: no    Last Vitals:  Filed Vitals:   12/27/15 1429 12/27/15 2029  BP: 111/55 196/55  Pulse: 90 98  Temp: 37.4 C 37.9 C  Resp:  18    Last Pain:  Filed Vitals:   12/27/15 2030  PainSc: 6                  Zac Torti

## 2015-12-28 DIAGNOSIS — K430 Incisional hernia with obstruction, without gangrene: Secondary | ICD-10-CM | POA: Diagnosis not present

## 2015-12-28 MED ORDER — HYDROCODONE-ACETAMINOPHEN 5-325 MG PO TABS: 1.0000 | ORAL_TABLET | ORAL | Status: DC | PRN

## 2015-12-28 NOTE — Progress Notes (Signed)
[name] to be D/C'd  per MD order. Discussed with the patient and all questions fully answered.  VSS, Skin clean, dry and intact without evidence of skin break down, no evidence of skin tears noted.  IV catheter discontinued intact. Site without signs and symptoms of complications. Dressing and pressure applied.  An After Visit Summary was printed and given to the patient. Patient received prescription.  D/c education completed with patient/family including follow up instructions, medication list, d/c activities limitations if indicated, with other d/c instructions as indicated by MD - patient able to verbalize understanding, all questions fully answered.   Patient instructed to return to ED, call 911, or call MD for any changes in condition.   Patient to be escorted via [place], and D/C home via private auto.

## 2015-12-28 NOTE — Discharge Summary (Signed)
Patient ID: [name] [id number] 50 y.o. [birth year]  Admit date: 12/26/2015  Discharge date and time: 12/28/2015  Admitting Physician: Adin Hector  Discharge Physician: Adin Hector  Admission Diagnoses: Incisional hernia   Discharge Diagnoses: Incarcerated incisional hernia                                         Tobacco abuse                                          BMI 35                                          Hypertension, essential                                          Asthma, mild                                          GERD, chronic  Operations: Procedure(s): LAPAROSCOPIC REPAIR INCISIONAL HERNIA WITH MESH INSERTION OF MESH  Admission Condition: good  Discharged Condition: good  Indication for Admission: This is a 51 year old Caucasian female, referred by Eldridge Abrahams, FNP for evaluation of a symptomatic incisional hernia in the periumbilical area. Tammy Luciana Axe is her primary care physician. She is also followed at behavioral health for her bipolar disorder.  The patient has a past history of open cholecystectomy in 1987, through a right subcostal incision. She had a diagnostic laparoscopy in 1997 through a 4 cm transverse infraumbilical incision. , leading to a diagnosis of ovarian cancer. She had a total abdominal hysterectomy and bilateral salpingo-oophorectomy through a Pfannenstiel incision in [place] by her gynecologist. It does not sound she had omentectomy or lymph node dissection. She says she had stage I cervical cancer and stage II ovarian cancer. No known recurrence. No details available. Not followed regularly.  She gives a three-year history of intermittent pain and intermittent bulging at her umbilicus. This is getting a little bit worse. Beginning to interfere with her daily activities. She would like something done electively.  Comorbidities include bipolar disorder, very stable on medication. Cigarette  smoker. Says she smokes 5-6 cigarettes a day. Chronic fatigue. Chronic anxiety. Status post open cholecystectomy. Status post TAH and BSO. Benign hypertension. GERD. Asthma.  Social history reveals she lives in [place]. He is single but has a boyfriend. Has 2 children that live in [place]. Does smoke but denies alcohol. Disabled because of bipolar disorder.  She wanted to go ahead with elective surgery. She'll be scheduled for laparoscopic repair of her incarcerated incisional hernia with mesh, possible open repair.She agrees with this plan.  Hospital Course: On the day of admission the patient was taken to the operating room and underwent laparoscopic repair of an incarcerated incisional hernia with mesh.  The defect was about 3 cm or slightly greater.  The omentum was incarcerated in the hernia and had to be debrided to be reduced.  The repair was  a 15 x 20 cm piece of ventra-Lite ST mesh placed laparoscopically.    Postoperatively the patient did fairly well.  Over the next 36 hours she progressed in her diet and activities without any difficulty.  She had no trouble voiding.  No trouble tolerating her diet.  No trouble ambulating.  She had some low-grade fever to 100.2 which was felt to be due to atelectasis.  On the day of discharge her lungs were clear to auscultation.  Abdomen was soft and nondistended all of the wounds looked clean.  She was given instruction in diet and activities.  She was strongly encouraged to stop smoking.  She was advised to proactively treat and prevent constipation.  She was asked to return to see me in the office in 10-12 days for staple removal.  She was given a prescription for Norco for pain.  She was told to continue all of her usual medications.      Consults: None  Significant Diagnostic Studies: none  Treatments: surgery: Laparoscopic repair of incarcerated incisional hernia with mesh  Disposition: Home  Patient Instructions:     Medication List    TAKE these medications        albuterol 108 (90 Base) MCG/ACT inhaler  Commonly known as:  PROVENTIL HFA;VENTOLIN HFA  Inhale 1-2 puffs into the lungs every 6 (six) hours as needed for wheezing or shortness of breath.     amLODipine 5 MG tablet  Commonly known as:  NORVASC  Take 5 mg by mouth daily.     ARIPiprazole 5 MG tablet  Commonly known as:  ABILIFY  Take 1 tablet (5 mg total) by mouth daily.     buPROPion 300 MG 24 hr tablet  Commonly known as:  WELLBUTRIN XL  Take 1 tablet (300 mg total) by mouth daily.     HYDROcodone-acetaminophen 5-325 MG tablet  Commonly known as:  NORCO/VICODIN  Take 1-2 tablets by mouth every 4 (four) hours as needed for moderate pain.     losartan-hydrochlorothiazide 50-12.5 MG tablet  Commonly known as:  HYZAAR  Take 1 tablet by mouth 2 (two) times daily.     MULTIVITAMIN GUMMIES ADULT PO  Take 2 tablets by mouth every morning.     PRILOSEC 20 MG capsule  Generic drug:  omeprazole  Take 20 mg by mouth daily before breakfast.        Activity: No driving while on analgesics.  No driving for 1 week.  No sports or lifting more than 20 pounds for 6 weeks. Diet: low fat, low cholesterol diet Wound Care: none needed  Follow-up:  With Dr. Dalbert Batman in 10-12 days.  Signed: Edsel Petrin. Dalbert Batman, M.D., FACS General and minimally invasive surgery Breast and Colorectal Surgery  12/28/2015, 5:56 AM

## 2015-12-28 NOTE — Discharge Instructions (Signed)
See above

## 2015-12-31 ENCOUNTER — Other Ambulatory Visit (HOSPITAL_COMMUNITY): Payer: Self-pay | Admitting: Psychiatry

## 2016-01-22 ENCOUNTER — Ambulatory Visit: Payer: Medicare Other | Admitting: Diagnostic Neuroimaging

## 2016-02-01 ENCOUNTER — Ambulatory Visit (INDEPENDENT_AMBULATORY_CARE_PROVIDER_SITE_OTHER): Payer: Medicare Other | Admitting: Diagnostic Neuroimaging

## 2016-02-01 ENCOUNTER — Encounter: Payer: Self-pay | Admitting: Diagnostic Neuroimaging

## 2016-02-01 VITALS — BP 166/82 | HR 67 | Ht 61.0 in | Wt 186.2 lb

## 2016-02-01 DIAGNOSIS — M5442 Lumbago with sciatica, left side: Secondary | ICD-10-CM

## 2016-02-01 DIAGNOSIS — M542 Cervicalgia: Secondary | ICD-10-CM | POA: Diagnosis not present

## 2016-02-01 NOTE — Patient Instructions (Signed)
Thank you for coming to see [place] at [facility]. I hope we have been able to provide you high quality care today.  You may receive a patient satisfaction survey over the next few weeks. We would appreciate your feedback and comments so that we may continue to improve ourselves and the health of our patients.  - wait until hernia repair recovery period is completed - continue to improve on nutrition, fitness, relaxation techniques - use tylenol as needed for severe pain   ~~~~~~~~~~~~~~~~~~~~~~~~~~~~~~~~~~~~~~~~~~~~~~~~~~~~~~~~~~~~~~~~~  DR. PENUMALLI'S GUIDE TO HAPPY AND HEALTHY LIVING These are some of my general health and wellness recommendations. Some of them may apply to you better than others. Please use common sense as you try these suggestions and feel free to ask me any questions.   ACTIVITY/FITNESS Mental, social, emotional and physical stimulation are very important for brain and body health. Try learning a new activity (arts, music, language, sports, games).  Keep moving your body to the best of your abilities. You can do this at home, inside or outside, the park, community center, gym or anywhere you like. Consider a physical therapist or personal trainer to get started. Consider the app Sworkit. Fitness trackers such as smart-watches, smart-phones or Fitbits can help as well.   NUTRITION Eat more plants: colorful vegetables, nuts, seeds and berries.  Eat less sugar, salt, preservatives and processed foods.  Avoid toxins such as cigarettes and alcohol.  Drink water when you are thirsty. Warm water with a slice of lemon is an excellent morning drink to start the day.  Consider these websites for more information The Nutrition Source (https://www.henry-hernandez.biz/) Prec[employer] Nutrition (WindowBlog.ch)   RELAXATION Consider practicing mindfulness meditation or other relaxation techniques such as deep  breathing, prayer, yoga, tai chi, massage. See website mindful.org or the apps Headspace or Calm to help get started.   SLEEP Try to get at least 7-8+ hours sleep per day. Regular exercise and reduced caffeine will help you sleep better. Practice good sleep hygeine techniques. See website sleep.org for more information.   PLANNING Prepare estate planning, living will, healthcare POA documents. Sometimes this is best planned with the help of an attorney. Theconversationproject.org and agingwithdignity.org are excellent resources.

## 2016-02-01 NOTE — Progress Notes (Signed)
[facility]  PATIENT: [name] DOB: 08-12-1965  REFERRING CLINICIAN: Eldridge Abrahams HISTORY FROM: patient  REASON FOR VISIT: follow up   HISTORICAL  CHIEF COMPLAINT:  Chief Complaint  Patient presents with  . Neck/back pain    rm 6, "couldn't do PT- had umbilcal hernia repair; neck/back pain off and on, back pretty constant; numbness/tingling in feet-new"   . Follow-up    3 month    HISTORY OF PRESENT ILLNESS:   UPDATE 02/01/16: Since last visit, had set back with umbilical hernia, s/p surgery on 12/26/15, and now in recovery period. Anticipate will be clear to exercise fully later in August 2017. Pain is stable.   PRIOR HPI (10/22/15): 50 year old right-handed female here for evaluation of low back pain and neck pain. Patient has had low back pain since 2005. She describes pain in her right lower back, radiating to the left side, down the left buttock. She's been evaluated by spine doctor, neurologist, pain management doctor in the past. She's had x-rays, MRI of the lumbar spine, epidural steroid injections and radiofrequency ablation procedures from 2010 until 2015 without relief. Patient was on pain medications in the past but she does not want to get back on them. Patient has not tried physical therapy recently. Patient also has some squeezing pressure sensation in her neck without radiation into her arms. Patient had a back and neck injury at age 20 years old playing volleyball.   REVIEW OF SYSTEMS: Full 14 system review of systems performed and negative with exception of: fatigue hearing loss ringing in ears shortness of breath cough snoring diarrhea incontinence joint pain joint swelling aching muscles headache numbness weakness insomnia depression anxiety racing thoughts decreased activities not enough sleep.   ALLERGIES: Allergies  Allergen Reactions  . Barium-Containing Compounds Other (See Comments)    Stomach cramps, extreme diarrhea, and vomiting    . Bee Venom Anaphylaxis  . Seldane [Terfenadine] Nausea And Vomiting and Other (See Comments)    CAUSES TOP LAYER OF SKIN TO PEEL  . Sulfa Antibiotics Anaphylaxis  . Lisinopril Cough  . Lithium Other (See Comments)    Agitation and hostility  . Tetracyclines & Related Hives and Nausea And Vomiting  . Aspirin Rash and Other (See Comments)    Upset stomach  . Ativan [Lorazepam] Other (See Comments)    Irritability  . Erythromycin Nausea And Vomiting, Rash and Other (See Comments)    Cramps  . Lipitor [Atorvastatin] Nausea And Vomiting    HOME MEDICATIONS: Outpatient Prescriptions Prior to Visit  Medication Sig Dispense Refill  . albuterol (PROVENTIL HFA;VENTOLIN HFA) 108 (90 BASE) MCG/ACT inhaler Inhale 1-2 puffs into the lungs every 6 (six) hours as needed for wheezing or shortness of breath.    Marland Kitchen amLODipine (NORVASC) 5 MG tablet Take 5 mg by mouth daily.     . ARIPiprazole (ABILIFY) 5 MG tablet TAKE 1 TABLET BY MOUTH ONCE A DAY. 30 tablet 0  . buPROPion (WELLBUTRIN XL) 300 MG 24 hr tablet TAKE 1 TABLET BY MOUTH ONCE DAILY. 30 tablet 0  . losartan-hydrochlorothiazide (HYZAAR) 50-12.5 MG tablet Take 1 tablet by mouth 2 (two) times daily.     . Multiple Vitamins-Minerals (MULTIVITAMIN GUMMIES ADULT PO) Take 2 tablets by mouth every morning.    Marland Kitchen HYDROcodone-acetaminophen (NORCO/VICODIN) 5-325 MG tablet Take 1-2 tablets by mouth every 4 (four) hours as needed for moderate pain. 30 tablet 0  . omeprazole (PRILOSEC) 20 MG capsule Take 20 mg by mouth daily before breakfast.  No facility-administered medications prior to visit.    PAST MEDICAL HISTORY: Past Medical History  Diagnosis Date  . Asthma   . Bipolar 1 disorder ([place])   . Bipolar disorder ([place])   . Fibromyalgia   . IBS (irritable bowel syndrome) Diagnosed November 2012  . Hypertension 08/04/2012  . Hepatic cyst   . Plantar fasciitis, bilateral   . Depression   . Dysrhythmia   . Incisional hernia with gangrene and  obstruction 12/26/2015  . Kidney stones     "passed them"  . Polycystic kidney disease     ruled out  . High cholesterol   . COPD (chronic obstructive pulmonary disease) ([place])   . Chronic bronchitis ([place])   . Sleep apnea     cpap coming  "soon" (12/26/2015)  . GERD (gastroesophageal reflux disease)   . History of hiatal hernia   . Degenerative disc disease   . Arthritis     "pretty much all over; mainly neck and back" (12/26/2015)  . Chronic back pain   . Anxiety   . Melanoma of back ([place])   . Ovarian cancer ([place])     "stage II"  . Cervical cancer ([place])     "stage I"    PAST SURGICAL HISTORY: Past Surgical History  Procedure Laterality Date  . Shoulder surgery Left 2010    Humeral Head Microfracture   . Abdominal hysterectomy  1997  . Laparoscopic abdominal exploration  1997  . Cesarean section  1987  . Appendectomy    . Upper gastrointestinal endoscopy  November 2012  . Colonoscopy  November 2012  . Melanoma excision  January 2013    removed from back  . Tubal ligation    . Carpal tunnel release Right   . Liver cyst removal  2000s  . Hernia repair    . Laparoscopic incisional / umbilical / ventral hernia repair  12/26/2015    repair of incarcerated incisional hernia with mesh  . Cholecystectomy open  1998  . Cardiac catheterization  2015  . Incisional hernia repair N/A 12/26/2015    Procedure: LAPAROSCOPIC REPAIR INCISIONAL HERNIA WITH MESH;  Surgeon: Fanny Skates, MD;  Location: [place] [facility];  Service: General;  Laterality: N/A;  . Insertion of mesh N/A 12/26/2015    Procedure: INSERTION OF MESH;  Surgeon: Fanny Skates, MD;  Location[facility];  Service: General;  Laterality: N/A;    FAMILY HISTORY: Family History  Problem Relation Age of Onset  . Adopted: Yes  . Bipolar disorder Mother     never diagnosed but patient suspects mother had bipolar disorder.Marland KitchenMarland KitchenMarland KitchenMarland Kitchenpt was adopeted, only knew birth mother for a short period of time.  Marland Kitchen Anxiety disorder Mother   . Suicidality  Mother   . ADD / ADHD Neg Hx   . Alcohol abuse Neg Hx   . Drug abuse Neg Hx   . Dementia Neg Hx   . Depression Neg Hx   . OCD Neg Hx   . Paranoid behavior Neg Hx   . Schizophrenia Neg Hx   . Seizures Neg Hx   . Sexual abuse Neg Hx   . Physical abuse Neg Hx   . Turner syndrome Daughter   . Bipolar disorder Daughter     SOCIAL HISTORY:  Social History   Social History  . Marital Status: Divorced    Spouse Name: N/A  . Number of Children: 2  . Years of Education: 76 1/2   Occupational History  .      disabled  Social History Main Topics  . Smoking status: Current Every Day Smoker -- 0.50 packs/day for 46 years    Types: Cigarettes  . Smokeless tobacco: Never Used  . Alcohol Use: 0.0 oz/week     Comment: 12/26/2015 'quit in ~ 1997"  . Drug Use: Yes    Special: Marijuana     Comment: 12/26/2015 "quit in ~  2010"  . Sexual Activity: Yes   Other Topics Concern  . Not on file   Social History Narrative   Lives with boyfriend   Caffeine use- soda 2-3 cans, coffee 3 cups daily     PHYSICAL EXAM  GENERAL EXAM/CONSTITUTIONAL: Vitals:  Filed Vitals:   02/01/16 0950  BP: 166/82  Pulse: 67  Height: 5\' 1"  (1.549 m)  Weight: 186 lb 3.2 oz (84.46 kg)   Body mass index is 35.2 kg/(m^2). No exam data present  Patient is in no distress; well developed, nourished and groomed; neck is supple  CARDIOVASCULAR:  Examination of carotid arteries is normal; no carotid bruits  Regular rate and rhythm, no murmurs  Examination of peripheral vascular system by observation and palpation is normal  EYES:  Ophthalmoscopic exam of optic discs and posterior segments is normal; no papilledema or hemorrhages  MUSCULOSKELETAL:  Gait, strength, tone, movements noted in Neurologic exam below  NEUROLOGIC: MENTAL STATUS:  No flowsheet data found.  awake, alert, oriented to person, place and time  recent and remote memory intact  normal attention and  concentration  language fluent, comprehension intact, naming intact,   fund of knowledge appropriate  CRANIAL NERVE:   2nd - no papilledema on fundoscopic exam  2nd, 3rd, 4th, 6th - pupils equal and reactive to light, visual fields full to confrontation, extraocular muscles intact, no nystagmus  5th - facial sensation symmetric  7th - facial strength symmetric  8th - hearing intact  9th - palate elevates symmetrically, uvula midline  11th - shoulder shrug symmetric  12th - tongue protrusion midline  MOTOR:   normal bulk and tone, full strength in the BUE, BLE  SENSORY:   normal and symmetric to light touch  COORDINATION:   finger-nose-finger, fine finger movements normal  REFLEXES:   deep tendon reflexes TRACE and symmetric  GAIT/STATION:   ANTALGIC GAIT; SLOW; narrow based gait; romberg is negative    DIAGNOSTIC DATA (LABS, IMAGING, TESTING) - I reviewed patient records, labs, notes, testing and imaging myself where available.  Lab Results  Component Value Date   WBC 10.2 12/25/2015   HGB 14.1 12/25/2015   HCT 44.5 12/25/2015   MCV 93.9 12/25/2015   PLT 343 12/25/2015      Component Value Date/Time   NA 142 12/25/2015 0827   K 3.7 12/25/2015 0827   CL 106 12/25/2015 0827   CO2 29 12/25/2015 0827   GLUCOSE 111* 12/25/2015 0827   BUN 9 12/25/2015 0827   CREATININE 0.97 12/25/2015 0827   CREATININE 0.74 02/08/2014 0723   CALCIUM 9.2 12/25/2015 0827   PROT 7.2 12/25/2015 0827   ALBUMIN 3.6 12/25/2015 0827   AST 20 12/25/2015 0827   ALT 26 12/25/2015 0827   ALKPHOS 79 12/25/2015 0827   BILITOT 0.7 12/25/2015 0827   GFRNONAA >60 12/25/2015 0827   GFRNONAA >89 02/08/2014 0723   GFRAA >60 12/25/2015 0827   GFRAA >89 02/08/2014 0723   No results found for: CHOL, HDL, LDLCALC, LDLDIRECT, TRIG, CHOLHDL Lab Results  Component Value Date   HGBA1C 6.0* 02/08/2014   No results found for: [id number]  Lab Results  Component Value Date   TSH 2.243  02/08/2014    03/20/15 MRI brain [I reviewed images myself and agree with interpretation. -VRP]  1. No acute finding, including infarct. 2. Changes of mild chronic small vessel ischemia.  02/17/13 MRI cervical spine [I reviewed images myself and agree with interpretation. -VRP]  - Minimal degenerative disc disease in the cervical spine.  Small soft disc protrusion to the right at C6-7 as described.  02/17/13 MRI lumbar spine [I reviewed images myself and agree with interpretation. -VRP]  - L4-L5, L5-S1: facet arthrosis without stenosis.     ASSESSMENT AND PLAN  50 y.o. year old female here with chronic neck pain, chronic low back pain, with deconditioning and decreased exercise tolerance. Advised patient on diagnosis, prognosis and treatment options. Suspect patient has significant musculoskeletal strain component, myofascial pain, and possible fibromyalgia.   Encouraged patient to gradually start physical activity and exercise training program, after hernia recovery is completed.  Dx:  1. Bilateral low back pain with left-sided sciatica   2. Neck pain     PLAN: - wait until hernia repair recovery period is completed - continue to improve on nutrition, fitness, relaxation techniques - use tylenol as needed for severe pain  Return if symptoms worsen or fail to improve, for return to PCP.    Penni Bombard, MD [id number], [id number] AM Certified in Neurology, Neurophysiology and Neuroimaging  [facility] [address], [place], [place] [address] [phone]

## 2016-02-12 ENCOUNTER — Emergency Department (HOSPITAL_COMMUNITY): Payer: Medicare Other

## 2016-02-12 ENCOUNTER — Encounter (HOSPITAL_COMMUNITY): Payer: Self-pay

## 2016-02-12 ENCOUNTER — Emergency Department (HOSPITAL_COMMUNITY)
Admission: EM | Admit: 2016-02-12 | Discharge: 2016-02-12 | Disposition: A | Discharge status: Home / Self Care | Payer: Medicare Other | Attending: Emergency Medicine | Admitting: Emergency Medicine

## 2016-02-12 DIAGNOSIS — I10 Essential (primary) hypertension: Secondary | ICD-10-CM | POA: Insufficient documentation

## 2016-02-12 DIAGNOSIS — Y929 Unspecified place or not applicable: Secondary | ICD-10-CM | POA: Insufficient documentation

## 2016-02-12 DIAGNOSIS — S92511A Displaced fracture of proximal phalanx of right lesser toe(s), initial encounter for closed fracture: Secondary | ICD-10-CM | POA: Diagnosis not present

## 2016-02-12 DIAGNOSIS — J449 Chronic obstructive pulmonary disease, unspecified: Secondary | ICD-10-CM | POA: Diagnosis not present

## 2016-02-12 DIAGNOSIS — M1990 Unspecified osteoarthritis, unspecified site: Secondary | ICD-10-CM | POA: Insufficient documentation

## 2016-02-12 DIAGNOSIS — J45909 Unspecified asthma, uncomplicated: Secondary | ICD-10-CM | POA: Insufficient documentation

## 2016-02-12 DIAGNOSIS — Z8541 Personal history of malignant neoplasm of cervix uteri: Secondary | ICD-10-CM | POA: Diagnosis not present

## 2016-02-12 DIAGNOSIS — F17210 Nicotine dependence, cigarettes, uncomplicated: Secondary | ICD-10-CM | POA: Diagnosis not present

## 2016-02-12 DIAGNOSIS — S92911A Unspecified fracture of right toe(s), initial encounter for closed fracture: Secondary | ICD-10-CM

## 2016-02-12 DIAGNOSIS — Y939 Activity, unspecified: Secondary | ICD-10-CM | POA: Diagnosis not present

## 2016-02-12 DIAGNOSIS — W228XXA Striking against or struck by other objects, initial encounter: Secondary | ICD-10-CM | POA: Insufficient documentation

## 2016-02-12 DIAGNOSIS — Y999 Unspecified external cause status: Secondary | ICD-10-CM | POA: Insufficient documentation

## 2016-02-12 DIAGNOSIS — Z8543 Personal history of malignant neoplasm of ovary: Secondary | ICD-10-CM | POA: Diagnosis not present

## 2016-02-12 DIAGNOSIS — F329 Major depressive disorder, single episode, unspecified: Secondary | ICD-10-CM | POA: Insufficient documentation

## 2016-02-12 DIAGNOSIS — Z79899 Other long term (current) drug therapy: Secondary | ICD-10-CM | POA: Insufficient documentation

## 2016-02-12 DIAGNOSIS — M79671 Pain in right foot: Secondary | ICD-10-CM | POA: Diagnosis present

## 2016-02-12 MED ORDER — LIDOCAINE HCL (PF) 1 % IJ SOLN
30.0000 mL | Freq: Once | INTRAMUSCULAR | Status: AC
  Administered 2016-02-12: 5 mL
  Filled 2016-02-12: qty 30

## 2016-02-12 MED ORDER — IBUPROFEN 400 MG PO TABS
400.0000 mg | ORAL_TABLET | Freq: Once | ORAL | Status: AC
  Administered 2016-02-12: 400 mg via ORAL
  Filled 2016-02-12: qty 1

## 2016-02-12 MED ORDER — LIDOCAINE HCL (PF) 1 % IJ SOLN
INTRAMUSCULAR | Status: AC
  Administered 2016-02-12: 5 mL
  Filled 2016-02-12: qty 15

## 2016-02-12 NOTE — Discharge Instructions (Signed)
Please maintain buddy taping, if you must be changed bandage, please take care to not manipulate the toe. Please use postop shoe, ice, elevate, use ibuprofen as needed for pain. Please follow-up with your primary care provider in 2 weeks for reevaluation. The emergency room if new or worsening signs or symptoms present.

## 2016-02-12 NOTE — ED Notes (Signed)
Pt caught rt 4th toe on door and states " i broke my toe "

## 2016-02-12 NOTE — ED Provider Notes (Signed)
CSN: [id number]     Arrival date & time 02/12/16  1814 History   First MD Initiated Contact with Patient 02/12/16 1822     Chief Complaint  Patient presents with  . Foot Pain   HPI   50 year old female presents today with right foot pain. Patient reports that she struck her right fourth digit on the door causing immediate pain. She reports angulation of the toe towards the lateral aspect foot. Pain to palpation, sensation intact, no other tenderness to surrounding tissues. No open wounds. No medications prior to arrival. No history of the same.  Past Medical History  Diagnosis Date  . Asthma   . Bipolar 1 disorder ([place])   . Bipolar disorder ([place])   . Fibromyalgia   . IBS (irritable bowel syndrome) Diagnosed November 2012  . Hypertension 08/04/2012  . Hepatic cyst   . Plantar fasciitis, bilateral   . Depression   . Dysrhythmia   . Incisional hernia with gangrene and obstruction 12/26/2015  . Kidney stones     "passed them"  . Polycystic kidney disease     ruled out  . High cholesterol   . COPD (chronic obstructive pulmonary disease) ([place])   . Chronic bronchitis ([place])   . Sleep apnea     cpap coming  "soon" (12/26/2015)  . GERD (gastroesophageal reflux disease)   . History of hiatal hernia   . Degenerative disc disease   . Arthritis     "pretty much all over; mainly neck and back" (12/26/2015)  . Chronic back pain   . Anxiety   . Melanoma of back ([place])   . Ovarian cancer ([place])     "stage II"  . Cervical cancer ([place])     "stage I"   Past Surgical History  Procedure Laterality Date  . Shoulder surgery Left 2010    Humeral Head Microfracture   . Abdominal hysterectomy  1997  . Laparoscopic abdominal exploration  1997  . Cesarean section  1987  . Appendectomy    . Upper gastrointestinal endoscopy  November 2012  . Colonoscopy  November 2012  . Melanoma excision  January 2013    removed from back  . Tubal ligation    . Carpal tunnel release Right   . Liver cyst removal   2000s  . Hernia repair    . Laparoscopic incisional / umbilical / ventral hernia repair  12/26/2015    repair of incarcerated incisional hernia with mesh  . Cholecystectomy open  1998  . Cardiac catheterization  2015  . Incisional hernia repair N/A 12/26/2015    Procedure: LAPAROSCOPIC REPAIR INCISIONAL HERNIA WITH MESH;  Surgeon: Fanny Skates, MD;  Location: [facility];  Service: General;  Laterality: N/A;  . Insertion of mesh N/A 12/26/2015    Procedure: INSERTION OF MESH;  Surgeon: Fanny Skates, MD;  Location[facility];  Service: General;  Laterality: N/A;   Family History  Problem Relation Age of Onset  . Adopted: Yes  . Bipolar disorder Mother     never diagnosed but patient suspects mother had bipolar disorder.Marland KitchenMarland KitchenMarland KitchenMarland Kitchenpt was adopeted, only knew birth mother for a short period of time.  Marland Kitchen Anxiety disorder Mother   . Suicidality Mother   . ADD / ADHD Neg Hx   . Alcohol abuse Neg Hx   . Drug abuse Neg Hx   . Dementia Neg Hx   . Depression Neg Hx   . OCD Neg Hx   . Paranoid behavior Neg Hx   . Schizophrenia Neg  Hx   . Seizures Neg Hx   . Sexual abuse Neg Hx   . Physical abuse Neg Hx   . Turner syndrome Daughter   . Bipolar disorder Daughter    Social History  Substance Use Topics  . Smoking status: Current Every Day Smoker -- 0.50 packs/day for 46 years    Types: Cigarettes  . Smokeless tobacco: Never Used  . Alcohol Use: 0.0 oz/week     Comment: 12/26/2015 'quit in ~ 1997"   OB History    No data available     Review of Systems  All other systems reviewed and are negative.   Allergies  Barium-containing compounds; Bee venom; Seldane; Sulfa antibiotics; Lisinopril; Lithium; Tetracyclines & related; Aspirin; Ativan; Erythromycin; and Lipitor  Home Medications   Prior to Admission medications   Medication Sig Start Date End Date Taking? Authorizing Provider  albuterol (PROVENTIL HFA;VENTOLIN HFA) 108 (90 BASE) MCG/ACT inhaler Inhale 1-2 puffs into the lungs every 6 (six)  hours as needed for wheezing or shortness of breath.    Historical Provider, MD  amLODipine (NORVASC) 5 MG tablet Take 5 mg by mouth daily.  11/12/15   Historical Provider, MD  ARIPiprazole (ABILIFY) 5 MG tablet TAKE 1 TABLET BY MOUTH ONCE A DAY. 01/01/16   Charlcie Cradle, MD  atorvastatin (LIPITOR) 10 MG tablet 10 mg daily. 01/31/16   Historical Provider, MD  buPROPion (WELLBUTRIN XL) 300 MG 24 hr tablet TAKE 1 TABLET BY MOUTH ONCE DAILY. 01/01/16   Charlcie Cradle, MD  losartan-hydrochlorothiazide (HYZAAR) 50-12.5 MG tablet Take 1 tablet by mouth 2 (two) times daily.     Historical Provider, MD  Multiple Vitamins-Minerals (MULTIVITAMIN GUMMIES ADULT PO) Take 2 tablets by mouth every morning.    Historical Provider, MD   BP 161/77 mmHg  Pulse 89  Temp(Src) 98.4 F (36.9 C) (Oral)  Resp 18  Ht 5' 1.75" (1.568 m)  Wt 84.823 kg  BMI 34.50 kg/m2  SpO2 96%   Physical Exam  Constitutional: She is oriented to person, place, and time. She appears well-developed and well-nourished.  HENT:  Head: Normocephalic and atraumatic.  Eyes: Conjunctivae are normal. Pupils are equal, round, and reactive to light. Right eye exhibits no discharge. Left eye exhibits no discharge. No scleral icterus.  Neck: Normal range of motion. No JVD present. No tracheal deviation present.  Pulmonary/Chest: Effort normal. No stridor.  Musculoskeletal:  Obvious deformity noted to the right fourth toe along the proximal phalanx , no soft tissue injury  Neurological: She is alert and oriented to person, place, and time. Coordination normal.  Psychiatric: She has a normal mood and affect. Her behavior is normal. Judgment and thought content normal.  Nursing note and vitals reviewed.   ED Course  Procedures (including critical care time)  NERVE BLOCK Performed by: Elmer Ramp Consent: Verbal consent obtained. Required items: required blood products, implants, devices, and special equipment available Time out:  Immediately prior to procedure a "time out" was called to verify the correct patient, procedure, equipment, support staff and site/side marked as required.  Indication: Reduction  Nerve block body site: Right fourth digit   Preparation: Patient was prepped and draped in the usual sterile fashion. Needle gauge: 24 G Location technique: anatomical landmarks  Local anesthetic: Lidocaine   Anesthetic total: 3 ml  Outcome: pain improved Patient tolerance: Patient tolerated the procedure well with no immediate complications. Labs Review Labs Reviewed - No data to display  Imaging Review Dg Foot Complete Right  02/12/2016  CLINICAL DATA:  Right foot pain and swelling after injury. Fourth toe injury, struck foot against a door jam today. EXAM: RIGHT FOOT COMPLETE - 3+ VIEW COMPARISON:  Radiographs 10/12/2015 FINDINGS: Oblique displaced angulated fracture of the fourth toe proximal phalanx. Fracture abuts the lateral aspect of the proximal interphalangeal joint. No additional acute fracture of the foot. IMPRESSION: Oblique displaced fourth toe proximal phalanx fracture. Electronically Signed   By: Jeb Levering M.D.   On: 02/12/2016 18:40   I have personally reviewed and evaluated these images and lab results as part of my medical decision-making.   EKG Interpretation None      MDM   Final diagnoses:  Toe fracture, right, closed, initial encounter    Labs:  Imaging:DG foot- oblique displaced fourth toe proximal phalanx fracture  Consults:  Therapeutics: ibuprofen   Discharge Meds:   Assessment/Plan:   50 year old female presents today with fracture of her toe. No open wounds, no need for urgent orthopedic consultation. Anatomical alignment necessary for this patient, discussion of digital block versus ice, patient would like to proceed with ice for 20 minutes followed by attempted reduction.  Patient reports that she would like digital block. Digital block performed here,  reduction performed without difficulty, anatomical position achieved. I personally buddy taped the toes together, patient will be given a postop shoe, instructed to use ibuprofen or Tylenol, ice, rest, elevate, follow-up with primary care in 2 weeks for reevaluation. Patient given strict return precautions, she verbalized understanding and agreement to today's plan had no further questions or concerns at time of discharge.         Okey Regal, PA-C 02/12/16 2002  Noemi Chapel, MD 02/13/16 [phone]

## 2016-02-12 NOTE — ED Notes (Signed)
Pt requesting pain medication before toe reduction. Pt states ice pack is not relieving pain.

## 2016-02-14 ENCOUNTER — Ambulatory Visit (HOSPITAL_COMMUNITY): Payer: Self-pay | Admitting: Clinical

## 2016-02-21 ENCOUNTER — Ambulatory Visit (INDEPENDENT_AMBULATORY_CARE_PROVIDER_SITE_OTHER): Payer: 59 | Admitting: Psychiatry

## 2016-02-21 ENCOUNTER — Encounter (HOSPITAL_COMMUNITY): Payer: Self-pay | Admitting: Psychiatry

## 2016-02-21 VITALS — BP 160/90 | HR 73 | Ht 62.0 in | Wt 185.2 lb

## 2016-02-21 DIAGNOSIS — F411 Generalized anxiety disorder: Secondary | ICD-10-CM | POA: Diagnosis not present

## 2016-02-21 DIAGNOSIS — G4700 Insomnia, unspecified: Secondary | ICD-10-CM

## 2016-02-21 DIAGNOSIS — F4310 Post-traumatic stress disorder, unspecified: Secondary | ICD-10-CM | POA: Diagnosis not present

## 2016-02-21 DIAGNOSIS — F4000 Agoraphobia, unspecified: Secondary | ICD-10-CM | POA: Diagnosis not present

## 2016-02-21 DIAGNOSIS — F3160 Bipolar disorder, current episode mixed, unspecified: Secondary | ICD-10-CM

## 2016-02-21 MED ORDER — LORAZEPAM 1 MG PO TABS: 1.0000 mg | ORAL_TABLET | Freq: Two times a day (BID) | ORAL | 1 refills | Status: DC | PRN

## 2016-02-21 MED ORDER — ARIPIPRAZOLE 5 MG PO TABS: 7.5000 mg | ORAL_TABLET | Freq: Every day | ORAL | 3 refills | Status: DC

## 2016-02-21 MED ORDER — BUPROPION HCL ER (XL) 300 MG PO TB24: 300.0000 mg | ORAL_TABLET | Freq: Every day | ORAL | 3 refills | Status: DC
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
## 2016-02-26 ENCOUNTER — Institutional Professional Consult (permissible substitution): Payer: Medicare Other | Admitting: Neurology

## 2016-02-28 ENCOUNTER — Ambulatory Visit (INDEPENDENT_AMBULATORY_CARE_PROVIDER_SITE_OTHER): Payer: 59 | Admitting: Clinical

## 2016-02-28 ENCOUNTER — Encounter (HOSPITAL_COMMUNITY): Payer: Self-pay | Admitting: Clinical

## 2016-02-28 DIAGNOSIS — F4310 Post-traumatic stress disorder, unspecified: Secondary | ICD-10-CM | POA: Diagnosis not present

## 2016-02-28 DIAGNOSIS — F3162 Bipolar disorder, current episode mixed, moderate: Secondary | ICD-10-CM | POA: Diagnosis not present
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
## 2016-02-28 NOTE — Progress Notes (Signed)
   THERAPIST PROGRESS NOTE  Session Time: 9:00 - 9:56   Participation Level: Active  Behavioral Response: CasualAlertDepressed  Type of Therapy: Individual Therapy  Treatment Goals addressed: improve psychiatric symptoms, Improve unhelpful thought patterns, , talk about a process trauma  Interventions: Motivational Interviewing,  CPT and psychoeducation  Summary: [name] is a 50 y.o. female who presents with PTSD, Bipolar 1 disorder, mixed, moderate,    Suicidal/Homicidal: No -without intent/plan  Therapist Response: [name] met with clinician for an individual session. [name] discussed his psychiatric symptoms and her current life events. [name] had been absent from therapy since May due to surgery and then also breaking her toe. Client and clinician reviewed and updated her assessment. [name] shared that she has been a bit manic lately. She shared that she also has been experiencing a lot of flashbacks to her childhood trauma lately. [name] shared she would like to work on her PTSD symptoms and she feels like she is ready. Client and clinician discussed PTSD symptoms. Client and clinician discussed the process of addressing her PTSD symptoms.  Clinician gave [name] a worksheet on Impact of her traumatic event. Client and clinician discussed the directions for filling out the worksheet. [name] agreed to do so and bring it back with her to next session.   Plan: Return again in 1 weeks.  Diagnosis: Axis I:  PTSD, Bipolar 1 disorder, mixed, moderate,      Anylah Scheib A, LCSW 02/28/2016

## 2016-02-29 ENCOUNTER — Other Ambulatory Visit (HOSPITAL_COMMUNITY): Payer: Self-pay | Admitting: Nurse Practitioner

## 2016-02-29 ENCOUNTER — Ambulatory Visit (HOSPITAL_COMMUNITY)
Admission: RE | Admit: 2016-02-29 | Discharge: 2016-02-29 | Disposition: A | Discharge status: Home / Self Care | Payer: Medicare Other | Source: Ambulatory Visit | Attending: Nurse Practitioner | Admitting: Nurse Practitioner

## 2016-02-29 DIAGNOSIS — X58XXXD Exposure to other specified factors, subsequent encounter: Secondary | ICD-10-CM | POA: Insufficient documentation

## 2016-02-29 DIAGNOSIS — R52 Pain, unspecified: Secondary | ICD-10-CM

## 2016-02-29 DIAGNOSIS — S92531D Displaced fracture of distal phalanx of right lesser toe(s), subsequent encounter for fracture with routine healing: Secondary | ICD-10-CM | POA: Insufficient documentation

## 2016-03-12 ENCOUNTER — Ambulatory Visit (INDEPENDENT_AMBULATORY_CARE_PROVIDER_SITE_OTHER): Payer: 59 | Admitting: Clinical

## 2016-03-12 DIAGNOSIS — F4310 Post-traumatic stress disorder, unspecified: Secondary | ICD-10-CM | POA: Diagnosis not present

## 2016-03-12 DIAGNOSIS — F3162 Bipolar disorder, current episode mixed, moderate: Secondary | ICD-10-CM

## 2016-03-12 NOTE — Progress Notes (Signed)
   THERAPIST PROGRESS NOTE  Session Time: 9:00 - 9:55  Participation Level: Active  Behavioral Response: CasualAlertAnxious  Type of Therapy: Individual Therapy  Treatment Goals addressed: improve psychiatric symptoms, Improve unhelpful thought patterns, Elevate mood, emotional regulation skills, talk about a process trauma   Interventions: CBT and Motivational Interviewing,   Summary: [name] is a 50 y.o. female who presents with Bipolar 1 disorder, mixed, moderate, PTSD, Alcohol Dependence in Remission, and Marijuana abuse in Remission  Suicidal/Homicidal: No -without intent/plan  Therapist Response: Katharine Look met with clinician for an individual session. [name] discussed his psychiatric symptoms, her current life events and her homework. She shared that she had been depressed with some mania. Client and clinician also discussed her PTSD symptoms and how they interact with her bipolar symptoms. Client and clinician discussed the impact the trauma has had on her. She identified some of the ways it informed her beliefs and perceptions. Clinician introduced an ABC worksheet. Client and clinician used one of her beliefs to fill out the worksheet together. [name] agreed to complete some more for homework.  Client and clinician reviewed some of her emotional regulation skills  Plan: Return again in 1 weeks.  Diagnosis: Axis I:   Bipolar 1 disorder, mixed, moderate, PTSD, Alcohol Dependence in Remission, and Marijuana abuse in Remission   Nihal Marzella A, LCSW 03/12/2016

## 2016-03-17 ENCOUNTER — Ambulatory Visit (INDEPENDENT_AMBULATORY_CARE_PROVIDER_SITE_OTHER): Payer: Medicare Other | Admitting: Neurology

## 2016-03-17 ENCOUNTER — Encounter: Payer: Self-pay | Admitting: Neurology

## 2016-03-17 VITALS — BP 138/78 | HR 82 | Resp 18 | Ht 61.0 in | Wt 186.0 lb

## 2016-03-17 DIAGNOSIS — G4733 Obstructive sleep apnea (adult) (pediatric): Secondary | ICD-10-CM | POA: Diagnosis not present

## 2016-03-17 DIAGNOSIS — R351 Nocturia: Secondary | ICD-10-CM

## 2016-03-17 DIAGNOSIS — G4710 Hypersomnia, unspecified: Secondary | ICD-10-CM

## 2016-03-17 DIAGNOSIS — Z789 Other specified health status: Secondary | ICD-10-CM | POA: Diagnosis not present

## 2016-03-17 NOTE — Patient Instructions (Signed)

## 2016-03-17 NOTE — Progress Notes (Signed)
Subjective:    Patient ID: [name] is a 50 y.o. female.  HPI     Star Age, MD, PhD [facility] [address], [address] [place], [place] [address]    Star Age, MD, PhD [facility] [address], [address] [place], [place] [address]   Dear Kieth Brightly,   I saw your patient, [name], upon your kind request, in my logic clinic today for initial consultation of her sleep disorder, in particular, concern for underlying obstructive sleep apnea. The patient is unaccompanied today. She missed an appointment on 02/26/2016. As you know, [name] is a 50 year old right-handed woman with an underlying medical history bipolar disease, fibromyalgia, irritable bowel syndrome, hypertension, plantar fasciitis, asthma, kidney stones, hyperlipidemia, melanoma, ovarian cancer, cervical cancer, smoking, chronic upper and lower back pain for which she has been seen my colleague, Dr. Leta Baptist, and obesity, who reports snoring and excessive daytime somnolence and inability to tolerate AutoPap. I reviewed your office note from 11/29/2015, which you kindly included. She had a home sleep test on 11/30/2015 which indicated an RDI of 50.4 per hour. Average oxygen saturation was 91%, lowest was 84%. Since then she was placed on AutoPap. She has not been able to tolerateIt unfortunately, she reports feeling of panic, claustrophobia, has been using a fullface mask. She tried the nasal pillows and did not like them either. I reviewed an AutoPap compliance report from 01/06/2016 through 02/04/2016, she used her machine 3 out of 30 days only. She goes to bed around 9-10 PM, she wakes up around 5-7 AM. She has nocturia about 4/night.  She is divorced and has 2 daughters, no grandchildren (yet!).  She lives with her boyfriend, [name]. She smokes a half pack per day, quit drinking alcohol in 1998, quit smoking marijuana in 2000. She drinks a lot of  caffeine, mostly in the form of soda, regular [facility], 4-6 cans per day. She denies restless leg symptoms or morning headaches.  Her Past Medical History Is Significant For: Past Medical History:  Diagnosis Date  . Anxiety   . Arthritis    "pretty much all over; mainly neck and back" (12/26/2015)  . Asthma   . Bipolar 1 disorder ([place])   . Bipolar disorder ([place])   . Cervical cancer ([place])    "stage I"  . Chronic back pain   . Chronic bronchitis ([place])   . COPD (chronic obstructive pulmonary disease) ([place])   . Degenerative disc disease   . Depression   . Dysrhythmia   . Fibromyalgia   . GERD (gastroesophageal reflux disease)   . Hepatic cyst   . High cholesterol   . History of hiatal hernia   . Hypertension 08/04/2012  . IBS (irritable bowel syndrome) Diagnosed November 2012  . Incisional hernia with gangrene and obstruction 12/26/2015  . Kidney stones    "passed them"  . Melanoma of back ([place])   . Ovarian cancer ([place])    "stage II"  . Plantar fasciitis, bilateral   . Polycystic kidney disease    ruled out  . Sleep apnea    cpap coming  "soon" (12/26/2015)    Her Past Surgical History Is Significant For: Past Surgical History:  Procedure Laterality Date  . ABDOMINAL HYSTERECTOMY  1997  . APPENDECTOMY    . CARDIAC CATHETERIZATION  2015  . CARPAL TUNNEL RELEASE Right   . CESAREAN SECTION  1987  . CHOLECYSTECTOMY OPEN  1998  . COLONOSCOPY  November 2012  . HERNIA REPAIR    . INCISIONAL HERNIA REPAIR N/A 12/26/2015   Procedure: LAPAROSCOPIC REPAIR INCISIONAL HERNIA WITH MESH;  Surgeon: Fanny Skates, MD;  Location: [place];  Service: General;  Laterality: N/A;  . INSERTION OF MESH N/A 12/26/2015   Procedure: INSERTION OF MESH;  Surgeon: Fanny Skates, MD;  Location: [place];  Service: General;  Laterality: N/A;  . [place]  . LAPAROSCOPIC INCISIONAL / UMBILICAL / VENTRAL HERNIA REPAIR  12/26/2015   repair of incarcerated incisional hernia  with mesh  . LIVER CYST REMOVAL  2000s  . MELANOMA EXCISION  January 2013   removed from back  . SHOULDER SURGERY Left 2010   Humeral Head Microfracture   . TUBAL LIGATION    . UPPER GASTROINTESTINAL ENDOSCOPY  November 2012    Her Family History Is Significant For: Family History  Problem Relation Age of Onset  . Adopted: Yes  . Bipolar disorder Mother     never diagnosed but patient suspects mother had bipolar disorder.Marland KitchenMarland KitchenMarland KitchenMarland Kitchenpt was adopeted, only knew birth mother for a short period of time.  Marland Kitchen Anxiety disorder Mother   . Suicidality Mother   . Turner syndrome Daughter   . Bipolar disorder Daughter   . ADD / ADHD Neg Hx   . Alcohol abuse Neg Hx   . Drug abuse Neg Hx   . Dementia Neg Hx   . Depression Neg Hx   . OCD Neg Hx   . Paranoid behavior Neg Hx   . Schizophrenia Neg Hx   . Seizures Neg Hx   . Sexual abuse Neg Hx   . Physical abuse Neg Hx     Her Social History Is Significant For: Social History   Social History  . Marital status: Significant Other    Spouse name: N/A  . Number of children: 2  . Years of education: 27 1/2   Occupational History  .      disabled   Social History Main Topics  . Smoking status: Current Every Day Smoker    Packs/day: 0.25    Years: 46.00    Types: Cigarettes  . Smokeless tobacco: Never Used  . Alcohol use No     Comment: 12/26/2015 'quit in ~ 1997"  . Drug use: No     Comment: 12/26/2015 "quit in ~  2010"  . Sexual activity: Yes   Other Topics Concern  . None   Social History Narrative   Lives with boyfriend   Caffeine use- drinks "several sodas a day"    Her Allergies Are:  Allergies  Allergen Reactions  . Barium-Containing Compounds Other (See Comments)    Stomach cramps, extreme diarrhea, and vomiting  . Bee Venom Anaphylaxis  . Seldane [Terfenadine] Nausea And Vomiting and Other (See Comments)    CAUSES TOP LAYER OF SKIN TO PEEL  . Sulfa Antibiotics Anaphylaxis  . Lisinopril Cough  . Lithium Other (See  Comments)    Agitation and hostility  . Tetracyclines & Related Hives and Nausea And Vomiting  . Aspirin Rash and Other (See Comments)    Upset stomach  . Ativan [Lorazepam] Other (See Comments)    Irritability  . Erythromycin Nausea And Vomiting, Rash and Other (See Comments)    Cramps  . Lipitor [Atorvastatin] Nausea And Vomiting  :   Her Current Medications Are:  Outpatient Encounter Prescriptions as of 03/17/2016  Medication Sig  . albuterol (PROVENTIL HFA;VENTOLIN HFA) 108 (90 BASE) MCG/ACT inhaler Inhale 1-2  puffs into the lungs every 6 (six) hours as needed for wheezing or shortness of breath.  Marland Kitchen amLODipine (NORVASC) 5 MG tablet Take 5 mg by mouth daily.   . ARIPiprazole (ABILIFY) 5 MG tablet Take 1.5 tablets (7.5 mg total) by mouth daily.  Marland Kitchen atorvastatin (LIPITOR) 10 MG tablet 10 mg daily.  Marland Kitchen buPROPion (WELLBUTRIN XL) 300 MG 24 hr tablet Take 1 tablet (300 mg total) by mouth daily.  Marland Kitchen LORazepam (ATIVAN) 1 MG tablet Take 1 tablet (1 mg total) by mouth 3 times/day as needed-between meals & bedtime for sleep.  Marland Kitchen losartan-hydrochlorothiazide (HYZAAR) 50-12.5 MG tablet Take 1 tablet by mouth 2 (two) times daily.   . Multiple Vitamins-Minerals (MULTIVITAMIN GUMMIES ADULT PO) Take 2 tablets by mouth every morning.   No facility-administered encounter medications on file as of 03/17/2016.   :  Review of Systems:  Out of a complete 14 point review of systems, all are reviewed and negative with the exception of these symptoms as listed below:  Her Past Medical History Is Significant For: Past Medical History:  Diagnosis Date  . Anxiety   . Arthritis    "pretty much all over; mainly neck and back" (12/26/2015)  . Asthma   . Bipolar 1 disorder ([place])   . Bipolar disorder ([place])   . Cervical cancer ([place])    "stage I"  . Chronic back pain   . Chronic bronchitis ([place])   . COPD (chronic obstructive pulmonary disease) ([place])   . Degenerative disc disease   . Depression   . Dysrhythmia    . Fibromyalgia   . GERD (gastroesophageal reflux disease)   . Hepatic cyst   . High cholesterol   . History of hiatal hernia   . Hypertension 08/04/2012  . IBS (irritable bowel syndrome) Diagnosed November 2012  . Incisional hernia with gangrene and obstruction 12/26/2015  . Kidney stones    "passed them"  . Melanoma of back ([place])   . Ovarian cancer ([place])    "stage II"  . Plantar fasciitis, bilateral   . Polycystic kidney disease    ruled out  . Sleep apnea    cpap coming  "soon" (12/26/2015)    Her Past Surgical History Is Significant For: Past Surgical History:  Procedure Laterality Date  . ABDOMINAL HYSTERECTOMY  1997  . APPENDECTOMY    . CARDIAC CATHETERIZATION  2015  . CARPAL TUNNEL RELEASE Right   . CESAREAN SECTION  1987  . CHOLECYSTECTOMY OPEN  1998  . COLONOSCOPY  November 2012  . HERNIA REPAIR    . INCISIONAL HERNIA REPAIR N/A 12/26/2015   Procedure: LAPAROSCOPIC REPAIR INCISIONAL HERNIA WITH MESH;  Surgeon: Fanny Skates, MD;  Location: [facility] [place];  Service: General;  Laterality: N/A;  . INSERTION OF MESH N/A 12/26/2015   Procedure: INSERTION OF MESH;  Surgeon: Fanny Skates, MD;  Location: [place];  Service: General;  Laterality: N/A;  . [place]  . LAPAROSCOPIC INCISIONAL / UMBILICAL / VENTRAL HERNIA REPAIR  12/26/2015   repair of incarcerated incisional hernia with mesh  . LIVER CYST REMOVAL  2000s  . MELANOMA EXCISION  January 2013   removed from back  . SHOULDER SURGERY Left 2010   Humeral Head Microfracture   . TUBAL LIGATION    . UPPER GASTROINTESTINAL ENDOSCOPY  November 2012    Her Family History Is Significant For: Family History  Problem Relation Age of Onset  . Adopted: Yes  . Bipolar disorder Mother  never diagnosed but patient suspects mother had bipolar disorder.Marland KitchenMarland KitchenMarland KitchenMarland Kitchenpt was adopeted, only knew birth mother for a short period of time.  Marland Kitchen Anxiety disorder Mother   . Suicidality Mother   . Turner syndrome Daughter    . Bipolar disorder Daughter   . ADD / ADHD Neg Hx   . Alcohol abuse Neg Hx   . Drug abuse Neg Hx   . Dementia Neg Hx   . Depression Neg Hx   . OCD Neg Hx   . Paranoid behavior Neg Hx   . Schizophrenia Neg Hx   . Seizures Neg Hx   . Sexual abuse Neg Hx   . Physical abuse Neg Hx     Her Social History Is Significant For: Social History   Social History  . Marital status: Significant Other    Spouse name: N/A  . Number of children: 2  . Years of education: 87 1/2   Occupational History  .      disabled   Social History Main Topics  . Smoking status: Current Every Day Smoker    Packs/day: 0.25    Years: 46.00    Types: Cigarettes  . Smokeless tobacco: Never Used  . Alcohol use No     Comment: 12/26/2015 'quit in ~ 1997"  . Drug use: No     Comment: 12/26/2015 "quit in ~  2010"  . Sexual activity: Yes   Other Topics Concern  . None   Social History Narrative   Lives with boyfriend   Caffeine use- drinks "several sodas a day"    Her Allergies Are:  Allergies  Allergen Reactions  . Barium-Containing Compounds Other (See Comments)    Stomach cramps, extreme diarrhea, and vomiting  . Bee Venom Anaphylaxis  . Seldane [Terfenadine] Nausea And Vomiting and Other (See Comments)    CAUSES TOP LAYER OF SKIN TO PEEL  . Sulfa Antibiotics Anaphylaxis  . Lisinopril Cough  . Lithium Other (See Comments)    Agitation and hostility  . Tetracyclines & Related Hives and Nausea And Vomiting  . Aspirin Rash and Other (See Comments)    Upset stomach  . Ativan [Lorazepam] Other (See Comments)    Irritability  . Erythromycin Nausea And Vomiting, Rash and Other (See Comments)    Cramps  . Lipitor [Atorvastatin] Nausea And Vomiting  :   Her Current Medications Are:  Outpatient Encounter Prescriptions as of 03/17/2016  Medication Sig  . albuterol (PROVENTIL HFA;VENTOLIN HFA) 108 (90 BASE) MCG/ACT inhaler Inhale 1-2 puffs into the lungs every 6 (six) hours as needed for wheezing  or shortness of breath.  Marland Kitchen amLODipine (NORVASC) 5 MG tablet Take 5 mg by mouth daily.   . ARIPiprazole (ABILIFY) 5 MG tablet Take 1.5 tablets (7.5 mg total) by mouth daily.  Marland Kitchen atorvastatin (LIPITOR) 10 MG tablet 10 mg daily.  Marland Kitchen buPROPion (WELLBUTRIN XL) 300 MG 24 hr tablet Take 1 tablet (300 mg total) by mouth daily.  Marland Kitchen LORazepam (ATIVAN) 1 MG tablet Take 1 tablet (1 mg total) by mouth 3 times/day as needed-between meals & bedtime for sleep.  Marland Kitchen losartan-hydrochlorothiazide (HYZAAR) 50-12.5 MG tablet Take 1 tablet by mouth 2 (two) times daily.   . Multiple Vitamins-Minerals (MULTIVITAMIN GUMMIES ADULT PO) Take 2 tablets by mouth every morning.   No facility-administered encounter medications on file as of 03/17/2016.   :  Review of Systems:  Out of a complete 14 point review of systems, all are reviewed and negative with the exception of these symptoms  as listed below: Review of Systems  Neurological:       Patient currently has a CPAP machine (2-4 months old). Unable to use it at this time, reports that she wakes up with panic attacks. She last had an at home sleep study a couple of months ago. Prior sleep study was done about 10+ years ago, was not put on CPAP after that test.   Patient has trouble falling and staying asleep, snoring, some daytime tiredness.     Objective:  Neurologic Exam  Physical Exam Physical Examination:   Vitals:   03/17/16 0958  BP: 138/78  Pulse: 82  Resp: 18   General Examination: The patient is a very pleasant 50 y.o. female in no acute distress. She appears well-developed and well-nourished and well groomed.   HEENT: Normocephalic, atraumatic, pupils are equal, round and reactive to light and accommodation. Funduscopic exam is normal with sharp disc margins noted. Extraocular tracking is good without limitation to gaze excursion or nystagmus noted. Normal smooth pursuit is noted. Hearing is grossly intact. Tympanic membranes are clear bilaterally. Face  is symmetric with normal facial animation and normal facial sensation. Speech is clear with no dysarthria noted. There is no hypophonia. There is no lip, neck/head, jaw or voice tremor. Neck is supple with full range of passive and active motion. There are no carotid bruits on auscultation. Oropharynx exam reveals: moderate mouth dryness, edentulous state and moderate airway crowding, Smaller airway entry, thicker soft palate and redundant soft palate. Mallampati is class II. Tongue protrudes centrally and palate elevates symmetrically. Tonsils are 1+ in size. Neck size is 20 inches.   Chest: Clear to auscultation without wheezing, rhonchi or crackles noted.  Heart: S1+S2+0, regular and normal without murmurs, rubs or gallops noted.   Abdomen: Soft, non-tender and non-distended with normal bowel sounds appreciated on auscultation.  Extremities: There is no pitting edema in the distal lower extremities bilaterally. Pedal pulses are intact.  Skin: Warm and dry without trophic changes noted. There are no varicose veins.  Musculoskeletal: exam reveals no obvious joint deformities, tenderness or joint swelling or erythema.   Neurologically:  Mental status: The patient is awake, alert and oriented in all 4 spheres. Her immediate and remote memory, attention, language skills and fund of knowledge are appropriate. There is no evidence of aphasia, agnosia, apraxia or anomia. Speech is clear with normal prosody and enunciation. Thought process is linear. Mood is normal and affect is normal.  Cranial nerves II - XII are as described above under HEENT exam. In addition: shoulder shrug is normal with equal shoulder height noted. Motor exam: Normal bulk, strength and tone is noted. There is no drift, tremor or rebound. Romberg is negative. Reflexes are 2+ throughout. Babinski: Toes are flexor bilaterally. Fine motor skills and coordination: intact with normal finger taps, normal hand movements, normal rapid  alternating patting, normal foot taps and normal foot agility.  Cerebellar testing: No dysmetria or intention tremor on finger to nose testing. Heel to shin is unremarkable bilaterally. There is no truncal or gait ataxia.  Sensory exam: intact to light touch, pinprick, vibration, temperature sense in the upper and lower extremities.  Gait, station and balance: She stands with mild difficulty, she can stand narrow based. Tandem walk is slightly challenging for her, otherwise, gait is somewhat cautious, she walks with a cane.              Assessment and Plan:   In summary, JANIS DEVINCENTIS is a very pleasant  50 y.o.-year old female with an underlying medical history bipolar disease, fibromyalgia, irritable bowel syndrome, hypertension, plantar fasciitis, asthma, kidney stones, hyperlipidemia, melanoma, ovarian cancer, cervical cancer, smoking, chronic upper and lower back pain for which she has been seen my colleague, Dr. Leta Baptist, and obesity, who  likely has severe obstructive sleep apnea based on a home sleep test in May 2017. She was placed on AutoPap but could not tolerate it, she is struggling with a feeling of panic and claustrophobia, however, would be willing to get retested properly with a sleep study and titrated properly with CPAP. I think she would benefit from an attended sleep study for diagnosis and management of her most likely moderate or severe obstructive sleep apnea (OSA). I had a long chat with the patient about my findings and the diagnosis of OSA, its prognosis and treatment options. We talked about medical treatments, surgical interventions and non-pharmacological approaches. I explained in particular the risks and ramifications of untreated moderate to severe OSA, especially with respect to developing cardiovascular disease down the Road, including congestive heart failure, difficult to treat hypertension, cardiac arrhythmias, or stroke. Even type 2 diabetes has, in part, been linked  to untreated OSA. Symptoms of untreated OSA include daytime sleepiness, memory problems, mood irritability and mood disorder such as depression and anxiety, lack of energy, as well as recurrent headaches, especially morning headaches. We talked about smoking cessation and trying to maintain a  healthy lifestyle in general, as well as the importance of weight control. I encouraged the patient to eat healthy, exercise daily and keep well hydrated, to keep a scheduled bedtime and wake time routine, to not skip any meals and eat healthy snacks in between meals. I advised the patient not to drive when feeling sleepy. Furthermore, she is advised to reduce her caffeine intake and increase her water intake instead. I recommended the following at this time: sleep study with potential positive airway pressure titration. (We will score hypopneas at 4% and split the sleep study into diagnostic and treatment portion, if the estimated. 2 hour AHI is >20/h).   I explained the sleep test procedure to the patient and also outlined possible surgical and non-surgical treatment options of OSA, including the use of a custom-made dental device (which would require a referral to a specialist dentist or oral surgeon), upper airway surgical options, such as pillar implants, radiofrequency surgery, tongue base surgery, and UPPP (which would involve a referral to an ENT surgeon). Rarely, jaw surgery such as mandibular advancement may be considered.  I also explained the CPAP treatment option to the patient, who indicated that she would be willing to try CPAP if the need arises. I explained the importance of being compliant with PAP treatment, not only for insurance purposes but primarily to improve Her symptoms, and for the patient's long term health benefit, including to reduce Her cardiovascular risks. I answered all her questions today and the patient was in agreement. I would like to see her back after the sleep study is completed  and encouraged her to call with any interim questions, concerns, problems or updates.   Thank you very much for allowing me to participate in the care of this nice patient. If I can be of any further assistance to you please do not hesitate to call me at [phone].  Sincerely,   Star Age, MD, PhD

## 2016-03-18 ENCOUNTER — Encounter (HOSPITAL_COMMUNITY): Payer: Self-pay | Admitting: Clinical

## 2016-03-27 ENCOUNTER — Ambulatory Visit (HOSPITAL_COMMUNITY): Payer: Self-pay | Admitting: Clinical

## 2016-04-21 ENCOUNTER — Ambulatory Visit (INDEPENDENT_AMBULATORY_CARE_PROVIDER_SITE_OTHER): Payer: Medicare Other | Admitting: Neurology

## 2016-04-21 DIAGNOSIS — G4733 Obstructive sleep apnea (adult) (pediatric): Secondary | ICD-10-CM | POA: Diagnosis not present

## 2016-04-21 DIAGNOSIS — G4761 Periodic limb movement disorder: Secondary | ICD-10-CM

## 2016-05-02 ENCOUNTER — Telehealth: Payer: Self-pay | Admitting: Neurology

## 2016-05-02 DIAGNOSIS — G4733 Obstructive sleep apnea (adult) (pediatric): Secondary | ICD-10-CM

## 2016-05-02 DIAGNOSIS — G4761 Periodic limb movement disorder: Secondary | ICD-10-CM

## 2016-05-02 NOTE — Telephone Encounter (Signed)
Patient referred by Eldridge Abrahams, seen by me on 03/17/16, diagnostic PSG on 04/21/16.   Please call and notify the patient that the recent sleep study did confirm the diagnosis of obstructive sleep apnea and that I recommend treatment for this in the form of CPAP. This will require a repeat sleep study for proper titration and mask fitting. Please explain to patient and arrange for a CPAP titration study. I have placed an order in the chart. Thanks, and please route to [facility] for scheduling next sleep study.  Star Age, MD, PhD [facility])

## 2016-05-02 NOTE — Progress Notes (Signed)
PATIENT'S NAME:  [name], [name] DOB:      12/18/1965      MR#:    [id number]     DATE OF RECORDING: 04/21/2016 REFERRING M.D.:  Eldridge Abrahams, MD Study Performed:   Baseline Polysomnogram HISTORY:  50 year old right-handed woman with an underlying medical history bipolar disease, fibromyalgia, irritable bowel syndrome, hypertension, plantar fasciitis, asthma, kidney stones, hyperlipidemia, melanoma, ovarian cancer, cervical cancer, smoking, chronic upper and lower back pain, and obesity, who reports snoring and excessive daytime somnolence and inability to tolerate AutoPap.    The patient's weight 186 pounds with a height of 61 (inches), resulting in a BMI of 35. kg/m2.  The patient's neck circumference measured 20 inches.  CURRENT MEDICATIONS: Albuterol, Amlodipine, Aripiprazole, Atorvastatin, Bupropion, Lorazepam, Losartan-Hydrochlorothiazide, Multi-Vitamin   PROCEDURE:  This is a multichannel digital polysomnogram utilizing the Somnostar 11.2 system.  Electrodes and sensors were applied and monitored per AASM Specifications.   EEG, EOG, Chin and Limb EMG, were sampled at 200 Hz.  ECG, Snore and Nasal Pressure, Thermal Airflow, Respiratory Effort, CPAP Flow and Pressure, Oximetry was sampled at 50 Hz. Digital video and audio were recorded.      BASELINE STUDY  Lights Out was at 00:04 and Lights On at 06:49.  Total recording time (TRT) was 405.5, with a total sleep time (TST) of 316.5 minutes.   The patient's sleep latency was 16.5 minutes.  REM latency was 108.5 minutes.  The sleep efficiency was 78.1 %.     SLEEP ARCHITECTURE: Wake after sleep onset was 79.5 minutes with moderate sleep fragmentation noted, Stage N1 4.9%, Stage N2 63.8%, Stage N3 0% and Stage R (REM sleep) 31.3%.   RESPIRATORY ANALYSIS:  There was a total of 63 respiratory events:  4 obstructive apneas, 0 central apneas and0 mixed apneas with a total of 4 apneas and an apnea index (AI) of .8. There were 59 hypopneas with a  hypopnea index of 11.2. The patient also had 0 respiratory event related arousals (RERAs).   Mild and at times moderate snoring was noted.      The total APNEA/HYPOPNEA INDEX (AHI) was 11.9 and the total RESPIRATORY DISTURBANCE INDEX was 11.9.  58 events occurred in REM sleep and 10 events in NREM. The REM AHI was 35.2, versus a non-REM AHI of 1.4. The patient spent 4% of total sleep time in the supine position. The supine AHI was 0.0 versus a non-supine AHI of 12.4.  OXYGEN SATURATION & C02:  The baseline 02 saturation was 96%, with the lowest being 86%. Time spent below 89% saturation equaled 20 minutes.   PERIODIC LIMB MOVEMENTS:   The patient had a total of 291 Periodic Limb Movements.  The Periodic Limb Movement (PLM) index was 55.2 and the PLM Arousal index was 3.6.   IMPRESSION:  1. Obstructive Sleep Apnea  2. Periodic Limb Movement Disorder   RECOMMENDATIONS:  1.  This study demonstrates overall mild obstructive sleep apnea, severe in REM sleep. Given her medical history, her sleep related complaints and her prior intolerance to autoPAP, I would recommend that the patient return for a full-night, attended, CPAP titration study to optimize therapy. Weight loss will likely aid in reducing her sleep disordered breathing.  2.  Good sleep hygiene should be reviewed with all patients; patient should be advised to stop smoking. She should be advised, not to drive, be at heights or use heavy/dangerous machinery when tired or sleepy.  3.  Severe PLMs were noted with minimal arousals; clinical correlation is  recommended.  4. A follow up appointment will be scheduled in the Sleep Clinic at [facility]. The referring provider will be notified of the test results.     I certify that I have reviewed the entire raw data recording prior to the issuance of this report in accordance with the Standards of Accreditation of the American Academy of Sleep Medicine  (AASM)       Star Age, MD, PhD Diplomat, American Board of Psychiatry and Neurology  Diplomat, [place] of [facility]

## 2016-05-05 NOTE — Telephone Encounter (Signed)
Lm for patient to call back for results.  

## 2016-05-05 NOTE — Telephone Encounter (Signed)
Patient returned Diana's call regarding sleep study results.

## 2016-05-05 NOTE — Telephone Encounter (Signed)
I called patient back and she is aware of results and recommendations. She is willing to proceed with titration study. I will send copy of report to PCP.

## 2016-05-05 NOTE — Telephone Encounter (Signed)
Patient is returning your call regarding sleep results.

## 2016-05-26 ENCOUNTER — Ambulatory Visit (INDEPENDENT_AMBULATORY_CARE_PROVIDER_SITE_OTHER): Payer: Medicare Other | Admitting: Neurology

## 2016-05-26 DIAGNOSIS — G4733 Obstructive sleep apnea (adult) (pediatric): Secondary | ICD-10-CM

## 2016-05-26 DIAGNOSIS — G4761 Periodic limb movement disorder: Secondary | ICD-10-CM

## 2016-05-26 DIAGNOSIS — G4720 Circadian rhythm sleep disorder, unspecified type: Secondary | ICD-10-CM

## 2016-05-27 ENCOUNTER — Encounter: Payer: Self-pay | Admitting: *Deleted

## 2016-05-29 ENCOUNTER — Ambulatory Visit (HOSPITAL_COMMUNITY): Payer: Self-pay | Admitting: Psychiatry

## 2016-06-03 ENCOUNTER — Ambulatory Visit: Payer: Medicare Other | Attending: Family Medicine | Admitting: Physical Therapy

## 2016-06-03 ENCOUNTER — Telehealth: Payer: Self-pay | Admitting: Neurology

## 2016-06-03 ENCOUNTER — Encounter: Payer: Self-pay | Admitting: Physical Therapy

## 2016-06-03 DIAGNOSIS — G8929 Other chronic pain: Secondary | ICD-10-CM

## 2016-06-03 DIAGNOSIS — R296 Repeated falls: Secondary | ICD-10-CM | POA: Diagnosis present

## 2016-06-03 DIAGNOSIS — Z9181 History of falling: Secondary | ICD-10-CM

## 2016-06-03 DIAGNOSIS — R278 Other lack of coordination: Secondary | ICD-10-CM

## 2016-06-03 DIAGNOSIS — M5442 Lumbago with sciatica, left side: Secondary | ICD-10-CM | POA: Diagnosis present

## 2016-06-03 DIAGNOSIS — M5441 Lumbago with sciatica, right side: Secondary | ICD-10-CM | POA: Insufficient documentation

## 2016-06-03 DIAGNOSIS — G4733 Obstructive sleep apnea (adult) (pediatric): Secondary | ICD-10-CM

## 2016-06-03 DIAGNOSIS — M62830 Muscle spasm of back: Secondary | ICD-10-CM

## 2016-06-03 NOTE — Therapy (Signed)
[facility] [place], [place], [address] Phone: [phone]   Fax:  [phone]  Physical Therapy Evaluation  Patient Details  Name: [name] MRN: [id number] Date of Birth: [birth year] Referring Provider: Eldridge Abrahams, NP  Encounter Date: 06/03/2016      PT End of Session - 06/03/16 1121    Visit Number 1   Number of Visits 17   Date for PT Re-Evaluation 07/29/16   PT Start Time [id number]   PT Stop Time 1130   PT Time Calculation (min) 50 min   Activity Tolerance Patient limited by pain   Behavior During Therapy --  Heistant to perform any movements      Past Medical History:  Diagnosis Date  . Anxiety   . Arthritis    "pretty much all over; mainly neck and back" (12/26/2015)  . Asthma   . Bipolar 1 disorder ([place])   . Bipolar disorder ([place])   . Cervical cancer ([place])    "stage I"  . Chronic back pain   . Chronic bronchitis ([place])   . COPD (chronic obstructive pulmonary disease) ( [place])   . Degenerative disc disease   . Depression   . Dysrhythmia   . Fibromyalgia   . GERD (gastroesophageal reflux disease)   . Hepatic cyst   . High cholesterol   . History of hiatal hernia   . Hypertension 08/04/2012  . IBS (irritable bowel syndrome) Diagnosed November 2012  . Incisional hernia with gangrene and obstruction 12/26/2015  . Kidney stones    "passed them"  . Melanoma of back ([place])   . Ovarian cancer ([place])    "stage II"  . Plantar fasciitis, bilateral   . Polycystic kidney disease    ruled out  . Sleep apnea    cpap coming  "soon" (12/26/2015)    Past Surgical History:  Procedure Laterality Date  . ABDOMINAL HYSTERECTOMY  1997  . APPENDECTOMY    . CARDIAC CATHETERIZATION  2015  . CARPAL TUNNEL RELEASE Right   . CESAREAN SECTION  1987  . CHOLECYSTECTOMY OPEN  1998  . COLONOSCOPY  November 2012  . HERNIA REPAIR    . INCISIONAL HERNIA REPAIR N/A 12/26/2015   Procedure: LAPAROSCOPIC REPAIR  INCISIONAL HERNIA WITH MESH;  Surgeon: Fanny Skates, MD;  Location: [facility];  Service: General;  Laterality: N/A;  . INSERTION OF MESH N/A 12/26/2015   Procedure: INSERTION OF MESH;  Surgeon: Fanny Skates, MD;  Location: [facility];  Service: General;  Laterality: N/A;  . [place]  . LAPAROSCOPIC INCISIONAL / UMBILICAL / VENTRAL HERNIA REPAIR  12/26/2015   repair of incarcerated incisional hernia with mesh  . LIVER CYST REMOVAL  2000s  . MELANOMA EXCISION  January 2013   removed from back  . SHOULDER SURGERY Left 2010   Humeral Head Microfracture   . TUBAL LIGATION    . UPPER GASTROINTESTINAL ENDOSCOPY  November 2012    There were no vitals filed for this visit.       Subjective Assessment - 06/03/16 1045    Subjective Patient states that she has had "throbbing, pulsating pain that shoots down both legs" for "years". She states that the pain has occurred since she was 50 years old. At 1, she was playing volleyball and a gentlemen landed on her neck with his elbow causing "3 fractured vertebra" in her lower back  and "2 fractured vertebra" in the cervical region.  Limitations Sitting;Standing;Walking;Lifting;House hold activities  not able to lift   How long can you sit comfortably? 30 minutes   How long can you stand comfortably? 5 minutes   How long can you walk comfortably? 200 feet   Diagnostic tests Xrays: arthritis; MRI (last week): facet arthropathy   Patient Stated Goals Be able to go shopping at [facility] without pain  currently riding    Currently in Pain? Yes   Pain Score 8    Pain Location Back   Pain Orientation Lower;Posterior;Right;Left   Pain Descriptors / Indicators Throbbing;Constant;Aching;Tingling   Pain Type Chronic pain   Pain Radiating Towards all the way to feet bilaterally   Pain Onset Other (comment)  30+ years   Pain Frequency Constant   Aggravating Factors  all activities, ADLs   Pain Relieving Factors "Standing in shower  with hot water hitting back", laying supine with knees bent, hydrocodone "takes edge off"   Effect of Pain on Daily Activities decreases all activities   Multiple Pain Sites Yes   Pain Score 4   Pain Location Leg   Pain Orientation Right;Left;Posterior   Pain Descriptors / Indicators Dull;Nagging;Aching;Heaviness   Pain Type Chronic pain   Pain Radiating Towards toes   Pain Onset Other (comment)  30+ years   Pain Frequency Constant            [facility] PT Assessment - 06/03/16 0001      Assessment   Medical Diagnosis Facet arthropathy   Referring Provider Eldridge Abrahams, NP   Next MD Visit 06/10/16   Prior Therapy YES  5-6 years ago     Balance Screen   Has the patient fallen in the past 6 months Yes   How many times? 4   Has the patient had a decrease in activity level because of a fear of falling?  Yes   Is the patient reluctant to leave their home because of a fear of falling?  Yes     [place] Private residence   Living Arrangements Spouse/significant other   Type of [place] to enter   Entrance Stairs-Number of Steps 5   Entrance Stairs-Rails Right;Left;Can reach both   [place] One level   [place] seat     Prior Function   Level of [place] with community mobility with device   Vocation On disability   Leisure crochet, teach crochet classes, reading, cooking, housework     ROM / Strength   AROM / PROM / Strength AROM;PROM;Strength     AROM   AROM Assessment Site Lumbar;Knee   Lumbar Flexion >75% limited, P! throughout whole motion   Lumbar Extension >75% limited, P! immediately   Lumbar - Right Side Bend not able to test   Lumbar - Left Side Bend not able to test   Lumbar - Right Rotation not able to test   Lumbar - Left Rotation not able to test     Strength   Overall Strength Comments Patient perfromed active hip flexion, knee extension, knee flexion, ankle PF/DF in  sitting position. Did not assess strength due to patient back pain with all movements     Palpation   Palpation comment Pt very TTP L5/S1 region and right ilium. Pt states "there's a knot in this area"  PT Education - 06/03/16 1120    Education provided Yes   Education Details Gave pt info on portable TENS unit for home   Person(s) Educated Patient   Methods Handout;Explanation   Comprehension Verbalized understanding          PT Short Term Goals - 06/03/16 1133      PT SHORT TERM GOAL #1   Title Patient will show proper recall with TENS unit.   Time 2   Period Weeks   Status New           PT Long Term Goals - 06/03/16 1138      PT LONG TERM GOAL #1   Title Patient will decrease overall pain rating by 50% so that she can perfrom daily activities with less pain.   Time 8   Period Weeks   Status New     PT LONG TERM GOAL #2   Title Patient will be able to endure basic passive LE stretches with no increase in symptoms in order to work on flexibility.   Time 8   Period Weeks   Status New     PT LONG TERM GOAL #3   Title Patient will be bale to work through pelvic tilt progrsssion exercises in order to increase core activation.   Time 8   Period Weeks   Status New               Plan - 06/03/16 1122    Clinical Impression Statement Patient presents with chronic low back pain that radiates down her posterior legs and into the dorsal aspect of her feet and toes. Patient unable to be fully assessed due to pain. Patient perfromed acive lumbar flexion and extension and then needed to sit down due to pain. Strength grossly assessed but no resistance was given due to patient pain. Patient has grossly weak LE musculature in seated position. Patient PROM and flexibility was not measured due to pain. Patient also is at fall risk due to the increased amount of falls she has experienced in the past 6 months. Patient walks with a  single point cane.   Rehab Potential Fair   PT Frequency 2x / week   PT Duration 8 weeks   PT Treatment/Interventions Electrical Stimulation;ADLs/Self Care Home Management;Moist Heat;Gait training;Stair training;Functional mobility training;Patient/family education;Neuromuscular re-education;Balance training;Therapeutic exercise;Therapeutic activities;Manual techniques;Passive range of motion;Energy conservation   PT Next Visit Plan pain management   Consulted and Agree with Plan of Care Patient      Patient will benefit from skilled therapeutic intervention in order to improve the following deficits and impairments:  Abnormal gait, Decreased activity tolerance, Decreased balance, Decreased endurance, Decreased coordination, Decreased range of motion, Decreased strength, Decreased mobility, Difficulty walking, Increased muscle spasms, Impaired flexibility, Impaired perceived functional ability, Improper body mechanics, Postural dysfunction, Obesity, Pain  Visit Diagnosis: Chronic bilateral low back pain with bilateral sciatica  Muscle spasm of back  History of falling  Repeated falls  Other lack of coordination     Problem List Patient Active Problem List   Diagnosis Date Noted  . Incisional hernia with gangrene and obstruction 12/26/2015  . Incisional hernia with obstruction 12/26/2015  . Insomnia 08/14/2015  . Hypertensive urgency 03/19/2015  . Unsteady gait 03/19/2015  . Bipolar I disorder, most recent episode depressed ([place]) 07/04/2014  . PTSD (post-traumatic stress disorder) 01/31/2014  . GAD (generalized anxiety disorder) 01/31/2014  . Insomnia due to mental disorder 05/28/2012  . Bipolar 1 disorder ([place]) 10/02/2011  Renford Dills [place], SPT 06/03/2016, 11:42 AM  [facility] [place], [place], [address] Phone: [phone]   Fax:  [phone]  Name: [name] MRN: [id number] Date of  Birth: September 01, 1965

## 2016-06-03 NOTE — Telephone Encounter (Signed)
Patient seen on 03/17/16, PSG on 04/21/16, cpap study on 05/27/16:  Please call and inform patient that I have entered an order for treatment with positive airway pressure (PAP) treatment of obstructive sleep apnea (OSA). She did well during the latest sleep study with CPAP. We will, therefore, arrange for a machine for home use through a DME (durable medical equipment) company of Her choice; and I will see the patient back in follow-up in about 8-10 weeks. Please also explain to the patient that I will be looking out for compliance data, which can be downloaded from the machine (stored on an SD card, that is inserted in the machine) or via remote access through a modem, that is built into the machine. At the time of the followup appointment we will discuss sleep study results and how it is going with PAP treatment at home. Please advise patient to bring Her machine at the time of the first FU visit, even though this is cumbersome. Bringing the machine for every visit after that will likely not be needed, but often helps for the first visit to troubleshoot if needed. Please re-enforce the importance of compliance with treatment and the need for [place] to monitor compliance data - often an insurance requirement and actually good feedback for the patient as far as how they are doing.  Also remind patient, that any interim PAP machine or mask issues should be first addressed with the DME company, as they can often help better with technical and mask fit issues. Please ask if patient has a preference regarding DME company.  Please also make sure, the patient has a follow-up appointment with me in about 8-10 weeks from the setup date, thanks.  Once you have spoken to the patient - and faxed/routed report to PCP and referring MD (if other than PCP), you can close this encounter, thanks,   Star Age, MD, PhD [facility] ([place])

## 2016-06-03 NOTE — Progress Notes (Signed)
PATIENT'S NAME:  [name], [name] DOB:      1965-12-11      MR#:    [id number]     DATE OF RECORDING: 05/26/2016 REFERRING M.D.:  Eldridge Abrahams, NP Study Performed:   CPAP  Titration HISTORY:  50 year old woman with a history of bipolar disease, fibromyalgia, irritable bowel syndrome, hypertension, plantar fasciitis, asthma, kidney stones, hyperlipidemia, melanoma, ovarian cancer, cervical cancer, smoking, chronic upper and lower back pain, and obesity, who presents for a full night CPAP titration study to treat her OSA. Patient had a baseline PSG on 04/21/16, which showed total AHI of 11.9/h, O2 nadir of 86%. The patient's weight 185 pounds with a height of 61 (inches), resulting in a BMI of 35. kg/m2. The patient's neck circumference measured 20 inches.  CURRENT MEDICATIONS: Albuterol, Amlodipine, Aripiprazole, Atorvastatin, Bupropion, Lorazepam, Losartan-Hydrochlorothiazide, Multi-Vitamin  PROCEDURE:  This is a multichannel digital polysomnogram utilizing the SomnoStar 11.2 system.  Electrodes and sensors were applied and monitored per AASM Specifications.   EEG, EOG, Chin and Limb EMG, were sampled at 200 Hz.  ECG, Snore and Nasal Pressure, Thermal Airflow, Respiratory Effort, CPAP Flow and Pressure, Oximetry was sampled at 50 Hz. Digital video and audio were recorded.      CPAP was initiated at 6 cmH20 with heated humidity per AASM split night standards and pressure was advanced to 9 cmH20 because of hypopneas, apneas and desaturations.  At a PAP pressure of 9 cmH20, there was a reduction of the AHI to 0. Non-supine REM sleep was achieved, O2 nadir was 89% on a pressure of 9 cm. A ResMed Airfit P10 nasal pillows interface size small was utilized for this study.     Lights Out was at 21:00 and Lights On at 05:00. Total recording time (TRT) was 479 minutes, with a total sleep time (TST) of 356 minutes. The patient's sleep latency was 32 minutes with 0 minutes of wake time after sleep onset. REM latency  was 54 minutes, which is mildly reduced.  The sleep efficiency was 74.3 %.    SLEEP ARCHITECTURE: WASO (Wake after sleep onset) was 91 minutes with mild to moderate sleep fragmentation noted.  There were 18.5 minutes in Stage N1, 228.5 minutes Stage N2, 0 minutes Stage N3 and 109 minutes in Stage REM.  The percentage of Stage N1 was 5.2%, Stage N2 was 64.2%, which is mildly increased, absent Stage N3 and Stage R (REM sleep) was 30.6%, which is increased (REM rebound.   RESPIRATORY ANALYSIS:  There was a total of 6 respiratory events: 0 obstructive apneas, 0 central apneas and 0 mixed apneas, 6 hypopneas with a hypopnea index of 1./hour. The patient also had 0 respiratory event related arousals (RERAs).      The total APNEA/HYPOPNEA INDEX  (AHI) was 1. /hour and the total RESPIRATORY DISTURBANCE INDEX was 1. .hour  2 events occurred in REM sleep and 4 events in NREM. The REM AHI was 1.1 /hour versus a non-REM AHI of 1. /hour.  The patient spent 58 minutes of total sleep time in the supine position and 298 minutes in non-supine. The supine AHI was 0.0, versus a non-supine AHI of 1.2.  OXYGEN SATURATION & C02:  The baseline 02 saturation was 95%, with the lowest being 86%. Time spent below 89% saturation equaled 0 minutes.  PERIODIC LIMB MOVEMENTS:    The Periodic Limb Movement (PLM) index was 68.1 and the PLM Arousal index was 2/hour. Post study, the patient indicated, that she slept better than usual.  DIAGNOSIS 1. Obstructive Sleep Apnea  2. Periodic Limb Movement Disorder  3. Dysfunctions associated with sleep stages or arousal from sleep.  PLANS/RECOMMENDATIONS: 1. This study demonstrates resolution of the patient's obstructive sleep apnea with CPAP therapy. I will, therefore, start the patient on home CPAP treatment at a pressure of 9 cm via small nasal pillows with heated humidity and EPR of 3 or as desired. The patient should be reminded to be fully compliant with PAP therapy to improve  sleep related symptoms and decrease long term cardiovascular risks. The patient should be reminded, that it may take up to 3 months to get fully used to using PAP with all planned sleep. The earlier full compliance is achieved, the better long term compliance tends to be. Please note that untreated obstructive sleep apnea carries additional perioperative morbidity. Patients with significant obstructive sleep apnea should receive perioperative PAP therapy and the surgeons and particularly the anesthesiologist should be informed of the diagnosis and the severity of the sleep disordered breathing. 2. Please note that untreated obstructive sleep apnea carries additional perioperative morbidity. Patients with significant obstructive sleep apnea should receive perioperative PAP therapy and the surgeons and particularly the anesthesiologist should be informed of the diagnosis and the severity of the sleep disordered breathing. 3. The patient should be cautioned not to drive, work at heights, or operate dangerous or heavy equipment when tired or sleepy. Review and reiteration of good sleep hygiene measures should be pursued with any patient. 4. Severe PLMs (periodic limb movements of sleep) were noted during the study without significant arousals; clinical correlation is recommended. 5. This study shows sleep fragmentation and abnormal sleep stage percentages; these are nonspecific findings and per se do not signify an intrinsic sleep disorder or a cause for the patient's sleep-related symptoms. Causes include (but are not limited to) the first night effect of the sleep study, circadian rhythm disturbances, medication effect or an underlying mood disorder or medical problem.  6. The patient will be seen in follow-up by Dr. Rexene Alberts at [facility] for discussion of the test results and further management strategies. The referring provider will be notified of the test results.  I certify that I have reviewed the entire raw data  recording prior to the issuance of this report in accordance with the Standards of Accreditation of the American Academy of Sleep Medicine (AASM)  Star Age, MD, PhD Diplomat, American Board of Psychiatry and Neurology  Diplomat, [place] of Sleep Medicine

## 2016-06-04 ENCOUNTER — Telehealth: Payer: Self-pay | Admitting: Neurology

## 2016-06-04 NOTE — Telephone Encounter (Signed)
See other phone note

## 2016-06-04 NOTE — Telephone Encounter (Signed)
I spoke to patient and she is aware of results and recommendations. She is willing to start treatment. She asked to send orders to [facility]. I will send orders there. I will send report to PCP. Patient will get a letter reminding her to make f/u appt and stress the importance of compliance.

## 2016-06-04 NOTE — Telephone Encounter (Signed)
Patient is calling to find out if a Rx for a CPAP will be sent to [facility] in [place]? Please call and advise.

## 2016-06-05 ENCOUNTER — Telehealth: Payer: Self-pay | Admitting: Neurology

## 2016-06-05 NOTE — Telephone Encounter (Signed)
FYI

## 2016-06-05 NOTE — Telephone Encounter (Signed)
[facility] [phone] called to advise patient just got Auto Pap June 11th, insurance won't cover CPAP, states she isn't sure where patient got Auto PAP but states that company should be able to work with patient and switch her out.

## 2016-06-10 ENCOUNTER — Ambulatory Visit: Payer: Medicare Other | Admitting: Physical Therapy

## 2016-06-11 ENCOUNTER — Encounter: Payer: Self-pay | Admitting: Physical Therapy

## 2016-06-11 ENCOUNTER — Ambulatory Visit: Payer: Medicare Other | Admitting: Physical Therapy

## 2016-06-11 DIAGNOSIS — M5442 Lumbago with sciatica, left side: Principal | ICD-10-CM

## 2016-06-11 DIAGNOSIS — M5441 Lumbago with sciatica, right side: Principal | ICD-10-CM

## 2016-06-11 DIAGNOSIS — Z9181 History of falling: Secondary | ICD-10-CM

## 2016-06-11 DIAGNOSIS — M62830 Muscle spasm of back: Secondary | ICD-10-CM

## 2016-06-11 DIAGNOSIS — R278 Other lack of coordination: Secondary | ICD-10-CM

## 2016-06-11 DIAGNOSIS — G8929 Other chronic pain: Secondary | ICD-10-CM

## 2016-06-11 DIAGNOSIS — R296 Repeated falls: Secondary | ICD-10-CM

## 2016-06-11 NOTE — Therapy (Addendum)
[facility] [address] [place], [place], [address] Phone: [phone]   Fax:  [phone]  Physical Therapy Treatment  Patient Details  Name: [name] MRN: [id number] Date of Birth: [birth year] Referring Provider: Eldridge Abrahams, NP  Encounter Date: 06/11/2016      PT End of Session - 06/11/16 1050    Visit Number 2   Number of Visits 17   Date for PT Re-Evaluation 07/29/16   PT Start Time 3888   PT Stop Time 1105   PT Time Calculation (min) 50 min      Past Medical History:  Diagnosis Date  . Anxiety   . Arthritis    "pretty much all over; mainly neck and back" (12/26/2015)  . Asthma   . Bipolar 1 disorder ([place])   . Bipolar disorder ([place])   . Cervical cancer ([place])    "stage I"  . Chronic back pain   . Chronic bronchitis ([place])   . COPD (chronic obstructive pulmonary disease) ([place])   . Degenerative disc disease   . Depression   . Dysrhythmia   . Fibromyalgia   . GERD (gastroesophageal reflux disease)   . Hepatic cyst   . High cholesterol   . History of hiatal hernia   . Hypertension 08/04/2012  . IBS (irritable bowel syndrome) Diagnosed November 2012  . Incisional hernia with gangrene and obstruction 12/26/2015  . Kidney stones    "passed them"  . Melanoma of back ([place])   . Ovarian cancer ([place])    "stage II"  . Plantar fasciitis, bilateral   . Polycystic kidney disease    ruled out  . Sleep apnea    cpap coming  "soon" (12/26/2015)    Past Surgical History:  Procedure Laterality Date  . ABDOMINAL HYSTERECTOMY  1997  . APPENDECTOMY    . CARDIAC CATHETERIZATION  2015  . CARPAL TUNNEL RELEASE Right   . CESAREAN SECTION  1987  . CHOLECYSTECTOMY OPEN  1998  . COLONOSCOPY  November 2012  . HERNIA REPAIR    . INCISIONAL HERNIA REPAIR N/A 12/26/2015   Procedure: LAPAROSCOPIC REPAIR INCISIONAL HERNIA WITH MESH;  Surgeon: Fanny Skates, MD;  Location: [facility];  Service: General;  Laterality: N/A;   . INSERTION OF MESH N/A 12/26/2015   Procedure: INSERTION OF MESH;  Surgeon: Fanny Skates, MD;  Location: [facility];  Service: General;  Laterality: N/A;  . [place]  . LAPAROSCOPIC INCISIONAL / UMBILICAL / VENTRAL HERNIA REPAIR  12/26/2015   repair of incarcerated incisional hernia with mesh  . LIVER CYST REMOVAL  2000s  . MELANOMA EXCISION  January 2013   removed from back  . SHOULDER SURGERY Left 2010   Humeral Head Microfracture   . TUBAL LIGATION    . UPPER GASTROINTESTINAL ENDOSCOPY  November 2012    There were no vitals filed for this visit.      Subjective Assessment - 06/11/16 1018    Subjective Pt reports that her pain is about a 4 today, no changes since eval    Currently in Pain? Yes   Pain Score 4    Pain Location Back                         [facility] Adult PT Treatment/Exercise - 06/11/16 0001      Exercises   Exercises Knee/Hip;Lumbar     Lumbar Exercises: Aerobic   Stationary Bike NuStep L2  x5 min     Lumbar Exercises: Seated   Other Seated Lumbar Exercises seated front raises red ball 2x10   Other Seated Lumbar Exercises on sit fir front to back 2x10     Knee/Hip Exercises: Seated   Other Seated Knee/Hip Exercises Seated tband rows green 2x10   Marching 2 sets;5 reps     Modalities   Modalities Electrical Stimulation;Moist Heat     Moist Heat Therapy   Number Minutes Moist Heat 15 Minutes   Moist Heat Location Lumbar Spine     Electrical Stimulation   Electrical Stimulation Location low back   Electrical Stimulation Action IFC   Electrical Stimulation Parameters sitting to pt tolerance    Electrical Stimulation Goals Pain                  PT Short Term Goals - 06/03/16 1133      PT SHORT TERM GOAL #1   Title Patient will show proper recall with TENS unit.   Time 2   Period Weeks   Status New           PT Long Term Goals - 06/03/16 1138      PT LONG TERM GOAL #1   Title  Patient will decrease overall pain rating by 50% so that she can perfrom daily activities with less pain.   Time 8   Period Weeks   Status New     PT LONG TERM GOAL #2   Title Patient will be able to endure basic passive LE stretches with no increase in symptoms in order to work on flexibility.   Time 8   Period Weeks   Status New     PT LONG TERM GOAL #3   Title Patient will be bale to work through pelvic tilt progrsssion exercises in order to increase core activation.   Time 8   Period Weeks   Status New               Plan - 06/11/16 1051    Clinical Impression Statement Pt reports that she is in constant 4/10 low back pain. Reports therapy in the past tat didn't help, she reports that therapy made her legs go out then she quite going. Pt was able to complete all of today's exercises despite reports of pain. Pt unable to complete LAQ due to reports of pain. Pt with some difficulty with seated march. Able to perform sit fit pelvic motions with anterior and posterior pelvic tilt only.   Rehab Potential Fair   PT Frequency 2x / week   PT Duration 8 weeks   PT Next Visit Plan pain management, get pt moving      Patient will benefit from skilled therapeutic intervention in order to improve the following deficits and impairments:  Abnormal gait, Decreased activity tolerance, Decreased balance, Decreased endurance, Decreased coordination, Decreased range of motion, Decreased strength, Decreased mobility, Difficulty walking, Increased muscle spasms, Impaired flexibility, Impaired perceived functional ability, Improper body mechanics, Postural dysfunction, Obesity, Pain  Visit Diagnosis: Chronic bilateral low back pain with bilateral sciatica  Muscle spasm of back  History of falling  Repeated falls  Other lack of coordination     Problem List Patient Active Problem List   Diagnosis Date Noted  . Incisional hernia with gangrene and obstruction 12/26/2015  . Incisional  hernia with obstruction 12/26/2015  . Insomnia 08/14/2015  . Hypertensive urgency 03/19/2015  . Unsteady gait 03/19/2015  . Bipolar I disorder, most recent episode  depressed ([place]) 07/04/2014  . PTSD (post-traumatic stress disorder) 01/31/2014  . GAD (generalized anxiety disorder) 01/31/2014  . Insomnia due to mental disorder 05/28/2012  . Bipolar 1 disorder ([place]) 10/02/2011     PHYSICAL THERAPY DISCHARGE SUMMARY  Visits from Start of Care: 2  Plan: Patient agrees to discharge.  Patient goals were not met. Patient is being discharged due to not returning since the last visit.  ?????       Scot Jun, PTA 06/11/2016, 10:54 AM  [facility] [place], [place], [address] Phone: [phone]   Fax:  [phone]  Name: [name] MRN: [id number] Date of Birth: 1965-08-12

## 2016-06-12 ENCOUNTER — Ambulatory Visit: Payer: Medicare Other | Admitting: Physical Therapy

## 2016-06-12 ENCOUNTER — Telehealth: Payer: Self-pay | Admitting: Neurology

## 2016-06-12 NOTE — Telephone Encounter (Signed)
Pt called in asking the order be sent to [facility] fax: [phone]

## 2016-06-12 NOTE — Telephone Encounter (Signed)
Patient would like to get her CPAP equipment back and needs an order to the correct DME for this.  Please call patient.

## 2016-06-12 NOTE — Telephone Encounter (Signed)
CPAP orders and pt demographic information faxed to [facility] as requested.

## 2016-06-13 ENCOUNTER — Ambulatory Visit: Payer: Medicare Other | Admitting: Physical Therapy

## 2016-07-04 ENCOUNTER — Telehealth: Payer: Self-pay | Admitting: Neurology

## 2016-07-04 NOTE — Telephone Encounter (Signed)
Mandy with [employer] is calling to get an etiology letter stating why the patient failed CPAP before. Leafy Ro says once the letter is written she can pull it up in Epic.

## 2016-07-07 NOTE — Telephone Encounter (Signed)
Please call [facility] again. Patient went through [facility] in Sept and CPAP titration study in August 2017. Based on her test results are prescribed CPAP at 9 cm. I'm not sure, why [employer] is requesting a letter? Can you call Leafy Ro again?

## 2016-07-07 NOTE — Telephone Encounter (Signed)
I called pt. She says that [facility] has not delivered her new cpap yet. She does not have transportation and so [facility] needs to deliver the machine to her house. She says that [facility] needs a letter stating that she did not tolerate her first auto pap because she felt claustrophobic.  Of note,  I also scheduled pt for her follow up with Dr. Rexene Alberts on 09/10/15. Pt verbalized understanding of appt, date and time.  Dr. Rexene Alberts, are you agreeable to the letter for [place]? If so, is there anything you want me to include, other that the pt was unable to tolerate the apap because she felt claustrophobic?

## 2016-07-08 NOTE — Telephone Encounter (Signed)
Please furnish a letter to DME, [facility], stating that patient previously tried autoPAP for treatment of her OSA via full face mask and could not tolerate treatment at the time, secondary to feeling of panic and claustrophobia. It is possible, that the range of pressures and the type of interface utilized at the time were partly the cause of her intolerance. We are initiating treatment of her sleep apnea after retesting and utilizing CPAP via nasal pillows. She will benefit from additional education and desensitization through her DME company to have a better change at tolerating the treatment and ensuring better compliance.

## 2016-07-08 NOTE — Telephone Encounter (Signed)
I called pt and advised her that the letter [name] has been requesting has been sent to [facility]. Pt verbalized understanding and appreciation.

## 2016-07-08 NOTE — Telephone Encounter (Signed)
This is information I received from [facility] at [place] regarding this letter:  Good morning!   [facility] is needing an etiology letter before we  can proceed with her cpap. When a patient turns  in their cpap for any reason (non-compliance,  etc), for insurance to pay for another machine  the patient has to have a repeat sleep study  (which she did) and an etiology letter from the  physician stating why she failed cpap the first  time. Any questions, please call me, thanks!   Mandy at [facility]

## 2016-07-08 NOTE — Telephone Encounter (Signed)
Pt called to advise of the letter. Msg was relayed that RN rec'd information and it has been sent to Dr Rexene Alberts. She was appreciative that [facility] had called.Please call her at [phone] to confirm when letter is sent to [facility]

## 2016-08-01 IMAGING — MR MR HEAD W/O CM
8 of 10 series · 35 of 48 positions shown · non-contrast
Comparison: None.

CLINICAL DATA: Vertigo and hypertension.

EXAM:
MRI HEAD WITHOUT CONTRAST
TECHNIQUE: Multiplanar, multiecho pulse sequences of the brain and surrounding
structures were obtained without intravenous contrast.

[Series 2: [id number] · sagittal · 5.0mm · 0.45mm/px · 3 of 20 slices shown]
[im 1/20]
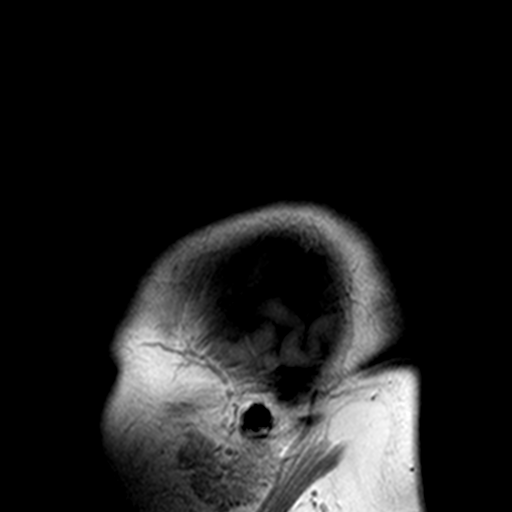
[im 10/20]
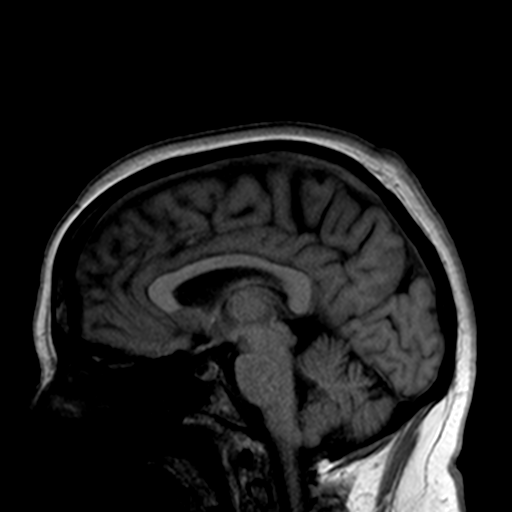
[im 20/20]
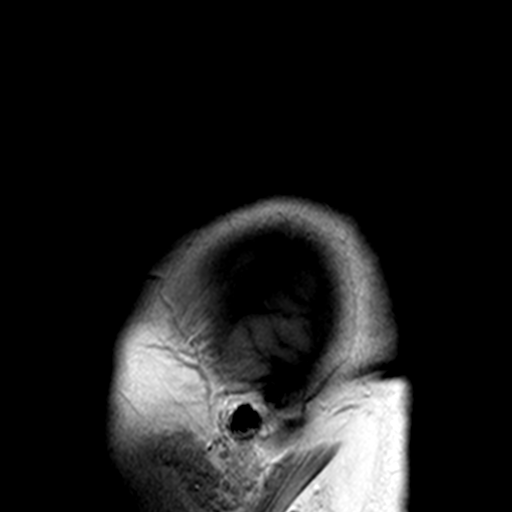

[Series 5: T2 · axial · 5.0mm · 0.75mm/px · z∈[-39,+104]mm · 4 of 23 slices shown (1 of 2)]
[im 1/23]
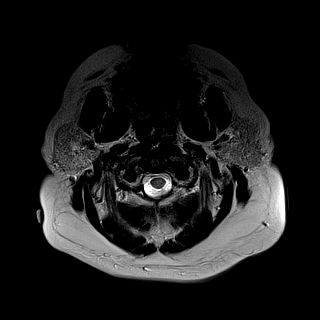
[im 8/23]
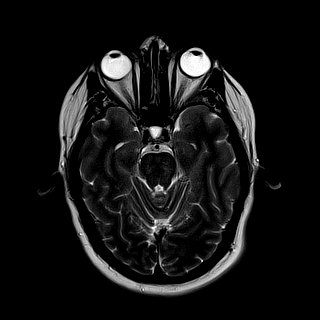
[im 15/23]
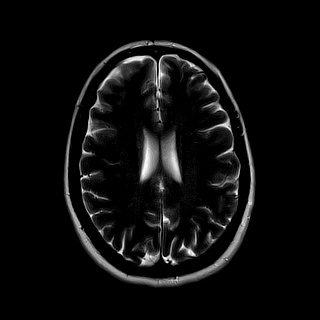
[im 23/23]
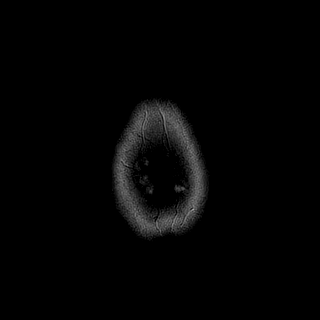

[Series 6: FLAIR · axial · 5.0mm · 0.94mm/px · z∈[-39,+104]mm · 3 of 23 slices shown]
[im 1/23]
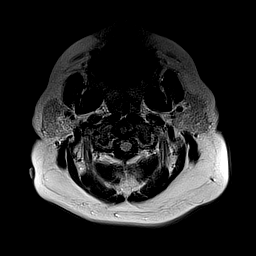
[im 12/23]
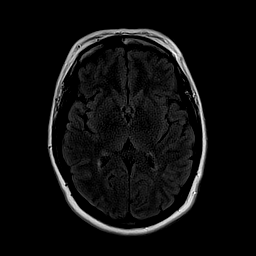
[im 23/23]
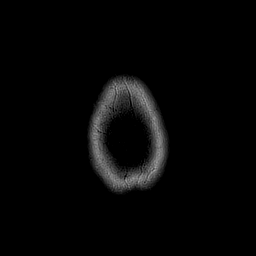

[Series 7: T1 · axial · 2.0mm · 0.47mm/px · z∈[-37,+135]mm · 8 of 87 slices shown]
[im 1/87]
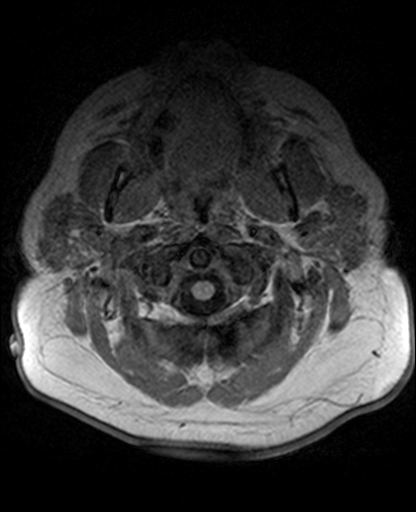
[im 10/87]
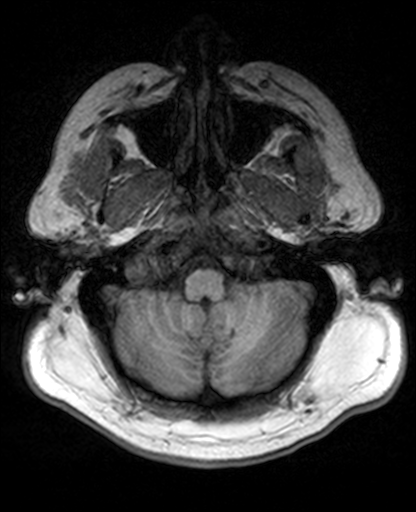
[im 29/87]
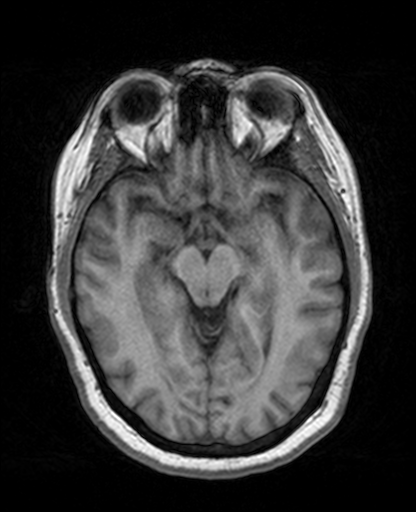
[im 39/87]
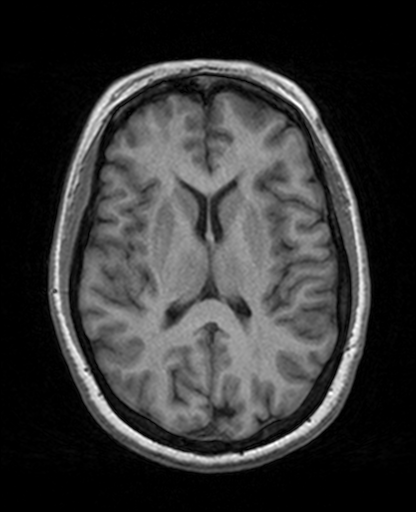
[im 48/87]
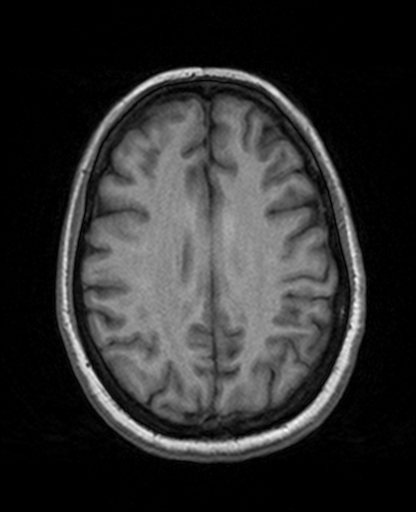
[im 58/87]
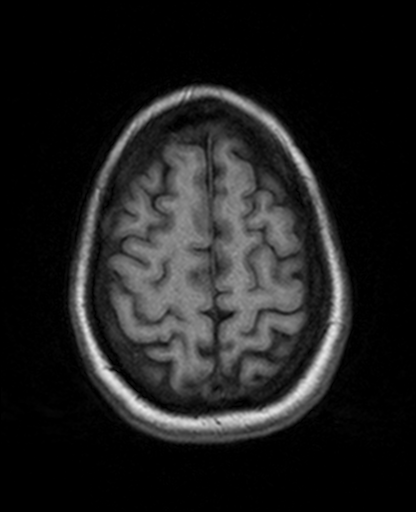
[im 77/87]
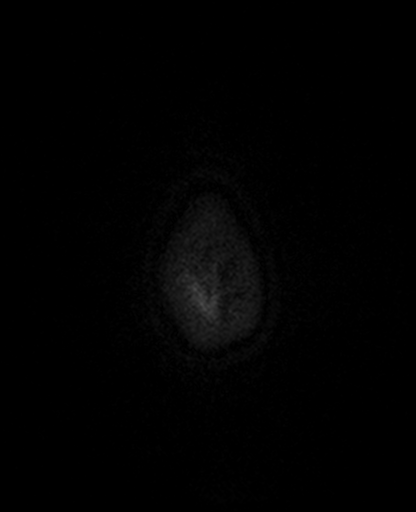
[im 87/87]
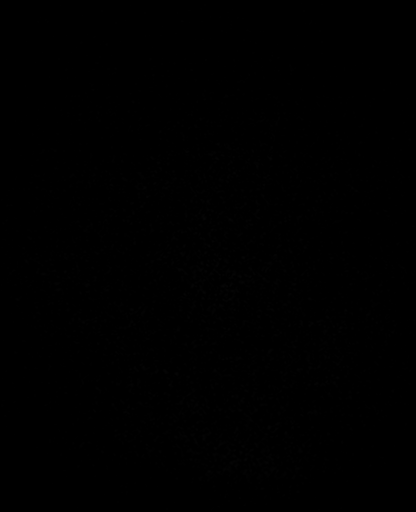

[Series 8: trauma axial · axial · 5.0mm · 0.45mm/px · z∈[-33,+97]mm · 3 of 21 slices shown]
[im 1/21]
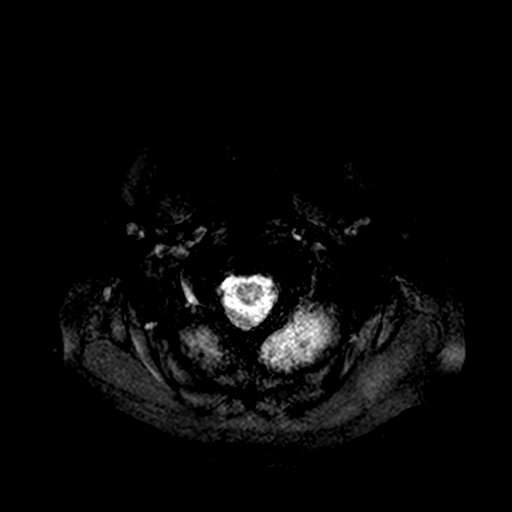
[im 11/21]
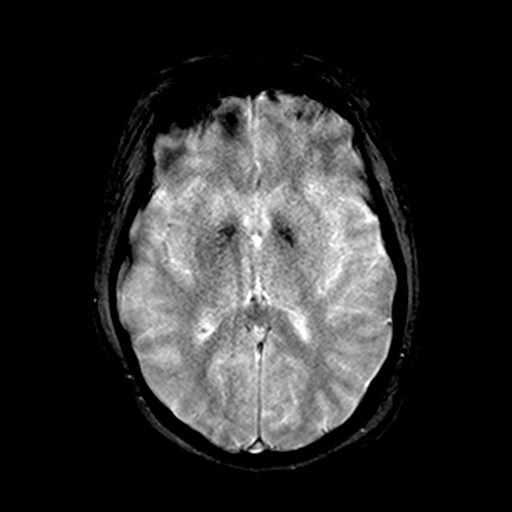
[im 21/21]
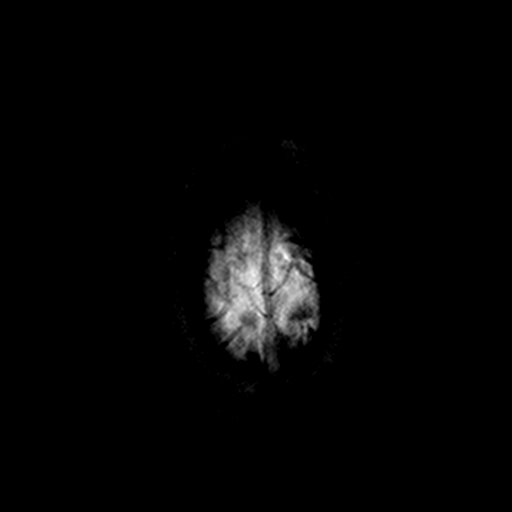

[Series 9: T2 · coronal · 5.0mm · 0.59mm/px · 3 of 28 slices shown (2 of 2)]
[im 1/28]
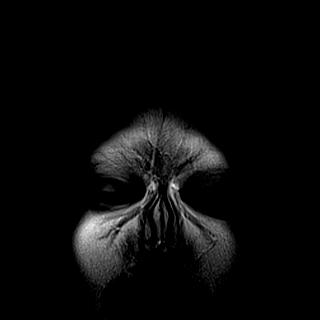
[im 14/28]
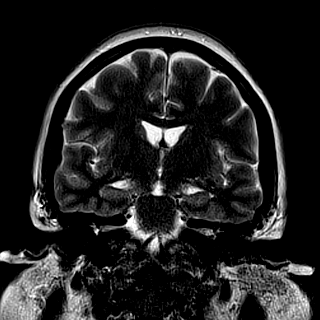
[im 28/28]
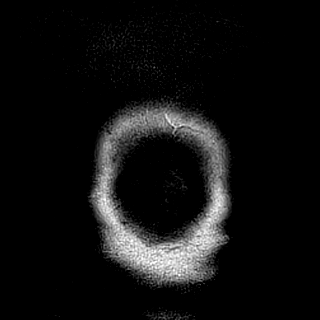

[Series 100: <mpr thick range> · axial · 3.0mm · 0.82mm/px · z∈[-37,+95]mm · 5 of 45 slices shown]
[im 1/45]
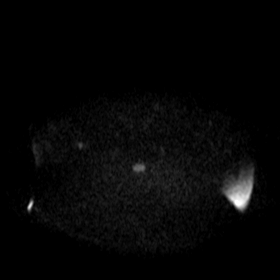
[im 12/45]
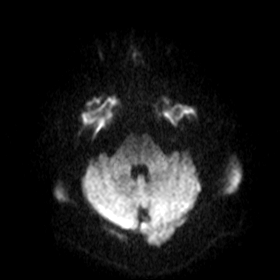
[im 23/45]
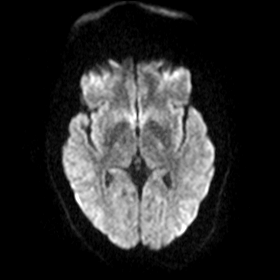
[im 34/45]
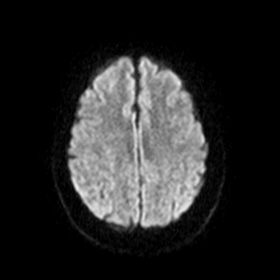
[im 45/45]
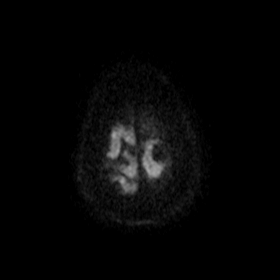

[Series 101: <mpr thick range(1)> · coronal · 3.0mm · 0.82mm/px · 6 of 54 slices shown]
[im 1/54]
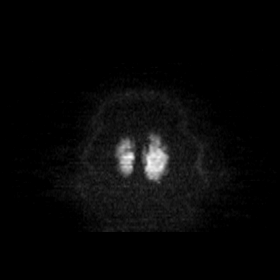
[im 11/54]
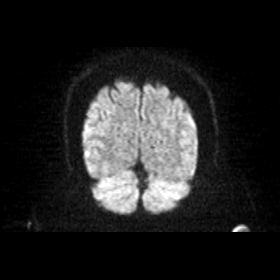
[im 22/54]
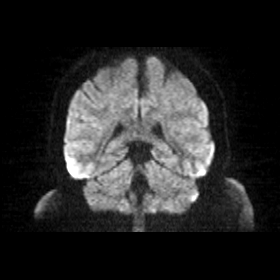
[im 32/54]
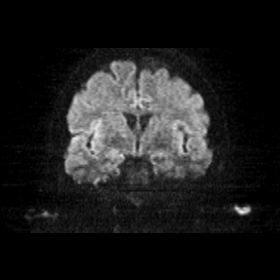
[im 43/54]
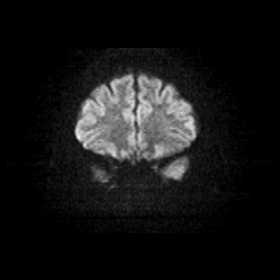
[im 54/54]
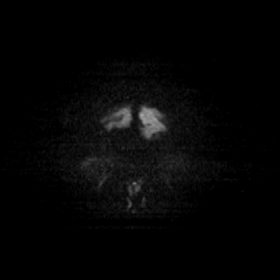

[35 of 48 positions shown; findings below may reference images not displayed]

FINDINGS: Calvarium and upper cervical spine: No focal marrow signal
abnormality.

Orbits: No significant findings.

Sinuses and Mastoids: Clear. Mastoid and middle ears are clear.
Symmetric and normal labyrinthine signal.

Brain: No acute or remote infarct, hemorrhage, hydrocephalus, or
mass lesion. No evidence of large vessel occlusion.

Mild indistinct T2 hyperintensity in the pons bilaterally with few
patchy FLAIR hyperintensities in the bilateral cerebral white
matter. Although nonspecific, this is likely early small vessel
ischemic disease in this patient with history of hypertension and
smoking.
IMPRESSION: 1. No acute finding, including infarct.
2. Changes of mild chronic small vessel ischemia.

## 2016-08-20 ENCOUNTER — Telehealth (HOSPITAL_COMMUNITY): Payer: Self-pay

## 2016-08-20 NOTE — Telephone Encounter (Signed)
Medication problem - Telephone call with pt to follow up on message she left that she restarted seeing her pain specialist, Dr. Carma Lair and he suggested she go on Cymbalta as she stopped Wellbutrin stating she was having nausea and vomiting with it.  Patient stated Dr. Carma Lair would write the medication but wanted to make sure Dr. Doyne Keel was okay with this being used before he started anything.  Patient states she is no longer taking Wellbutrin and would like to try the other if Dr. Doyne Keel thinks it could help.  Agreed to contact her back once discussed with Dr. Doyne Keel.

## 2016-08-21 NOTE — Telephone Encounter (Signed)
Yes she can start Cymbalta.

## 2016-08-21 NOTE — Telephone Encounter (Signed)
Called patient and let her know that it was okay to start the Cymbalta - She requested that I call Dr.Stillane and advise him that it is okay. I called his office and lvm for Tommi Rumps his CMA letting her know

## 2016-08-28 ENCOUNTER — Ambulatory Visit (HOSPITAL_COMMUNITY): Payer: Self-pay | Admitting: Psychiatry

## 2016-09-08 ENCOUNTER — Telehealth: Payer: Self-pay

## 2016-09-08 NOTE — Telephone Encounter (Signed)
I called pt and reminded her to bring her cpap to her appt tomorrow with Dr. Athar. Pt verbalized understanding.  

## 2016-09-09 ENCOUNTER — Encounter: Payer: Self-pay | Admitting: Neurology

## 2016-09-09 ENCOUNTER — Ambulatory Visit (INDEPENDENT_AMBULATORY_CARE_PROVIDER_SITE_OTHER): Payer: Medicare Other | Admitting: Neurology

## 2016-09-09 VITALS — BP 137/81 | HR 83 | Resp 20 | Ht 61.0 in | Wt 176.0 lb

## 2016-09-09 DIAGNOSIS — G4761 Periodic limb movement disorder: Secondary | ICD-10-CM | POA: Diagnosis not present

## 2016-09-09 DIAGNOSIS — M545 Low back pain: Secondary | ICD-10-CM | POA: Diagnosis not present

## 2016-09-09 DIAGNOSIS — G4733 Obstructive sleep apnea (adult) (pediatric): Secondary | ICD-10-CM

## 2016-09-09 DIAGNOSIS — M79604 Pain in right leg: Secondary | ICD-10-CM

## 2016-09-09 DIAGNOSIS — Z9989 Dependence on other enabling machines and devices: Secondary | ICD-10-CM | POA: Diagnosis not present

## 2016-09-09 NOTE — Progress Notes (Signed)
Subjective:    Patient ID: [name] is a 51 y.o. female.  HPI     Interim history:   Ms. Debellis is a 51 year old right-handed woman with an underlying medical history bipolar disease, fibromyalgia, irritable bowel syndrome, hypertension, plantar fasciitis, asthma, kidney stones, hyperlipidemia, melanoma, ovarian cancer, cervical cancer, smoking, chronic upper and lower back pain, and obesity, who presents for follow-up consultation of her sleep disturbance, after her recent sleep studies. The patient is unaccompanied today. I first met her on 03/17/2016 at the request of her primary care provider, at which time she reported snoring and excessive daytime somnolence, inability to tolerate AutoPap which was paced on a home sleep test which was abnormal in May 2017. She was invited for sleep study. She had a baseline sleep study, followed by a CPAP titration study. I went over her test results with her in detail today. Baseline sleep study from 04/21/2016 showed a sleep efficiency of 78.1%, sleep latency of 16.5 minutes, REM latency of 108.5 minutes. She had absence of slow-wave sleep and REM sleep was 31.3%. Total AHI was 11.9 per hour, REM AHI was 35.2 per hour, supine AHI was 0 per hour but she did not sleep supine for any length of time, about 4% of the study. Average oxygen saturation was 96%, nadir was 86%, time below 89% saturation was 20 minutes for the night, she had severe PLMS with mild arousals. She will return for a full night CPAP titration study on 05/26/2016. Sleep efficiency was 74.3%, sleep latency 32 minutes, REM latency was 54 minutes. REM percentage was 30.6% and slow-wave sleep was absent. CPAP was titrated from 6 cm to 9 cm. On a pressure of 9 cm her AHI was 0 per hour, she was fitted with a small nasal pillows interface. She had severe PLMS with minimal arousals. She did indicate that she slept better with CPAP. Based on her test results are prescribed CPAP therapy for home  use.  Today, 09/09/2016 (all dictated new, as well as above notes, some dictation done in note pad or Word, outside of chart, may appear as copied):  I reviewed her CPAP compliance data from 08/10/2016 through 09/08/2016 which is a total of 30 days, during which time she used her CPAP only 6 days with percent used days greater than 4 hours at 3%, indicating noncompliance. Average usage of 2 hours and 36 minutes for days on treatment. AHI 0.3 per hour, leak low, pressure of 9 cm. In the past 60 days she used her CPAP 31 days with percent used days greater than 4 hours at 32%, indicating poor compliance. She reports not having replacements for the nasal pillows, and has had recurrent sinus issues, has reduced her smoking and trying to lose weight. Lost about 18 lb in 6 months. Lives with fiance, eliminated sodas. Better experience overall compared to previous trial on autoPAP. Has FFM from before, but tried it again and had panic attack, cannot use FFM. No longer on Norco, sees Dr. Canary Brim, getting her first round of SI injection this week. Denies frank restless leg symptoms but is restless at night due to back pain primarily, sometimes she has other joint pain but mostly right-sided back pain with radiation to the right thigh.  The patient's allergies, current medications, family history, past medical history, past social history, past surgical history and problem list were reviewed and updated as appropriate.   Previously (copied from previous notes for reference):   03/17/2016: She reports snoring and excessive  daytime somnolence and inability to tolerate AutoPap. I reviewed your office note from 11/29/2015, which you kindly included. She had a home sleep test on 11/30/2015 which indicated an RDI of 50.4 per hour. Average oxygen saturation was 91%, lowest was 84%. Since then she was placed on AutoPap. She has not been able to tolerateIt unfortunately, she reports feeling of panic, claustrophobia, has been  using a fullface mask. She tried the nasal pillows and did not like them either. I reviewed an AutoPap compliance report from 01/06/2016 through 02/04/2016, she used her machine 3 out of 30 days only. She goes to bed around 9-10 PM, she wakes up around 5-7 AM. She has nocturia about 4/night.  She is divorced and has 2 daughters, no grandchildren (yet!).  She lives with her boyfriend, Dominica Severin. She smokes a half pack per day, quit drinking alcohol in 1998, quit smoking marijuana in 2000. She drinks a lot of caffeine, mostly in the form of soda, regular [facility], 4-6 cans per day. She denies restless leg symptoms or morning headaches.  Her Past Medical History Is Significant For: Past Medical History:  Diagnosis Date  . Anxiety   . Arthritis    "pretty much all over; mainly neck and back" (12/26/2015)  . Asthma   . Bipolar 1 disorder ([place])   . Bipolar disorder ([place])   . Cervical cancer ([place])    "stage I"  . Chronic back pain   . Chronic bronchitis ([place])   . COPD (chronic obstructive pulmonary disease) ([place])   . Degenerative disc disease   . Depression   . Dysrhythmia   . Fibromyalgia   . GERD (gastroesophageal reflux disease)   . Hepatic cyst   . High cholesterol   . History of hiatal hernia   . Hypertension 08/04/2012  . IBS (irritable bowel syndrome) Diagnosed November 2012  . Incisional hernia with gangrene and obstruction 12/26/2015  . Kidney stones    "passed them"  . Melanoma of back ([place])   . Ovarian cancer ([place])    "stage II"  . Plantar fasciitis, bilateral   . Polycystic kidney disease    ruled out  . Sleep apnea    cpap coming  "soon" (12/26/2015)    Her Past Surgical History Is Significant For: Past Surgical History:  Procedure Laterality Date  . ABDOMINAL HYSTERECTOMY  1997  . APPENDECTOMY    . CARDIAC CATHETERIZATION  2015  . CARPAL TUNNEL RELEASE Right   . CESAREAN SECTION  1987  . CHOLECYSTECTOMY OPEN  1998  . COLONOSCOPY  November 2012  . HERNIA REPAIR     . INCISIONAL HERNIA REPAIR N/A 12/26/2015   Procedure: LAPAROSCOPIC REPAIR INCISIONAL HERNIA WITH MESH;  Surgeon: Fanny Skates, MD;  Location: [facility];  Service: General;  Laterality: N/A;  . INSERTION OF MESH N/A 12/26/2015   Procedure: INSERTION OF MESH;  Surgeon: Fanny Skates, MD;  Location: [facility];  Service: General;  Laterality: N/A;  . [place]  . LAPAROSCOPIC INCISIONAL / UMBILICAL / VENTRAL HERNIA REPAIR  12/26/2015   repair of incarcerated incisional hernia with mesh  . LIVER CYST REMOVAL  2000s  . MELANOMA EXCISION  January 2013   removed from back  . SHOULDER SURGERY Left 2010   Humeral Head Microfracture   . TUBAL LIGATION    . UPPER GASTROINTESTINAL ENDOSCOPY  November 2012    Her Family History Is Significant For: Family History  Problem Relation Age of Onset  . Adopted: Yes  .  Bipolar disorder Mother     never diagnosed but patient suspects mother had bipolar disorder.Marland KitchenMarland KitchenMarland KitchenMarland Kitchenpt was adopeted, only knew birth mother for a short period of time.  Marland Kitchen Anxiety disorder Mother   . Suicidality Mother   . Turner syndrome Daughter   . Bipolar disorder Daughter   . ADD / ADHD Neg Hx   . Alcohol abuse Neg Hx   . Drug abuse Neg Hx   . Dementia Neg Hx   . Depression Neg Hx   . OCD Neg Hx   . Paranoid behavior Neg Hx   . Schizophrenia Neg Hx   . Seizures Neg Hx   . Sexual abuse Neg Hx   . Physical abuse Neg Hx     Her Social History Is Significant For: Social History   Social History  . Marital status: Significant Other    Spouse name: N/A  . Number of children: 2  . Years of education: 65 1/2   Occupational History  .      disabled   Social History Main Topics  . Smoking status: Current Every Day Smoker    Packs/day: 0.25    Years: 46.00    Types: Cigarettes  . Smokeless tobacco: Never Used  . Alcohol use No     Comment: 12/26/2015 'quit in ~ 1997"  . Drug use: No     Comment: 12/26/2015 "quit in ~  2010"  . Sexual activity: Yes    Other Topics Concern  . None   Social History Narrative   Lives with boyfriend   Caffeine use- drinks "several sodas a day"    Her Allergies Are:  Allergies  Allergen Reactions  . Barium-Containing Compounds Other (See Comments)    Stomach cramps, extreme diarrhea, and vomiting  . Bee Venom Anaphylaxis  . Seldane [Terfenadine] Nausea And Vomiting and Other (See Comments)    CAUSES TOP LAYER OF SKIN TO PEEL  . Sulfa Antibiotics Anaphylaxis  . Lisinopril Cough  . Lithium Other (See Comments)    Agitation and hostility  . Tetracyclines & Related Hives and Nausea And Vomiting  . Aspirin Rash and Other (See Comments)    Upset stomach  . Ativan [Lorazepam] Other (See Comments)    Irritability  . Erythromycin Nausea And Vomiting, Rash and Other (See Comments)    Cramps  . Lipitor [Atorvastatin] Nausea And Vomiting  :   Her Current Medications Are:  Outpatient Encounter Prescriptions as of 09/09/2016  Medication Sig  . albuterol (PROVENTIL HFA;VENTOLIN HFA) 108 (90 BASE) MCG/ACT inhaler Inhale 1-2 puffs into the lungs every 6 (six) hours as needed for wheezing or shortness of breath.  Marland Kitchen amLODipine (NORVASC) 5 MG tablet Take 5 mg by mouth daily.   . ARIPiprazole (ABILIFY) 5 MG tablet Take 1.5 tablets (7.5 mg total) by mouth daily.  . DULoxetine (CYMBALTA) 30 MG capsule Take 30 mg by mouth at bedtime.  Marland Kitchen LORazepam (ATIVAN) 1 MG tablet Take 1 tablet (1 mg total) by mouth 3 times/day as needed-between meals & bedtime for sleep.  Marland Kitchen losartan-hydrochlorothiazide (HYZAAR) 50-12.5 MG tablet Take 1 tablet by mouth 2 (two) times daily.   . Multiple Vitamins-Minerals (MULTIVITAMIN GUMMIES ADULT PO) Take 2 tablets by mouth every morning.  . [DISCONTINUED] atorvastatin (LIPITOR) 10 MG tablet 10 mg daily.  . [DISCONTINUED] buPROPion (WELLBUTRIN XL) 300 MG 24 hr tablet Take 1 tablet (300 mg total) by mouth daily.  . [DISCONTINUED] HYDROcodone-acetaminophen (NORCO) 7.5-325 MG tablet Take 1 tablet  by mouth every 6 (six)  hours as needed for moderate pain (take 1 every night).   No facility-administered encounter medications on file as of 09/09/2016.   :  Review of Systems:  Out of a complete 14 point review of systems, all are reviewed and negative with the exception of these symptoms as listed below:        Review of Systems  Neurological:       Pt presents to discuss her cpap usage. Pt is having trouble with [employer] not delivering her new nasal pillows.    Objective:  Neurologic Exam  Physical Exam Physical Examination:   Vitals:   09/09/16 0915  BP: 137/81  Pulse: 83  Resp: 20   General Examination: The patient is a very pleasant 51 y.o. female in no acute distress. She appears well-developed and well-nourished and adequately groomed.   HEENT: Normocephalic, atraumatic, pupils are equal, round and reactive to light and accommodation. Funduscopic exam is normal with sharp disc margins noted. Extraocular tracking is good without limitation to gaze excursion or nystagmus noted. Normal smooth pursuit is noted. Hearing is grossly intact. Face is symmetric with normal facial animation and normal facial sensation. Speech is clear with no dysarthria noted. There is no hypophonia. There is no lip, neck/head, jaw or voice tremor. Neck is supple with full range of passive and active motion. There are no carotid bruits on auscultation. Oropharynx exam reveals: moderate mouth dryness, and moderate airway crowding. Mallampati is class II. Tongue protrudes centrally and palate elevates symmetrically.   Chest: Clear to auscultation without wheezing, rhonchi or crackles noted.  Heart: S1+S2+0, regular and normal without murmurs, rubs or gallops noted.   Abdomen: Soft, non-tender and non-distended with normal bowel sounds appreciated on auscultation.  Extremities: There is no pitting edema in the distal lower extremities bilaterally.   Skin: Warm and dry with dry and eczematous  patches, across arms and legs.   Musculoskeletal: exam reveals no obvious joint deformities, tenderness or joint swelling or erythema, but reports low back pain with radiation to right thigh upon standing.   Neurologically:  Mental status: The patient is awake, alert and oriented in all 4 spheres. Her immediate and remote memory, attention, language skills and fund of knowledge are appropriate. There is no evidence of aphasia, agnosia, apraxia or anomia. Speech is clear with normal prosody and enunciation. Thought process is linear. Mood is normal and affect is normal.  Cranial nerves II - XII are as described above under HEENT exam. In addition: shoulder shrug is normal with equal shoulder height noted. Motor exam: Normal bulk, strength and tone is noted. There is no drift, tremor or rebound. Romberg is not testable safely she stands wider based, reflexes are 1+ throughout.  Fine motor skills and coordination: intact.  Cerebellar testing: No dysmetria or intention tremor.  Sensory exam: intact to light touch in the upper and lower extremities.  Gait, station and balance: She stands with difficulty. No veering to one side is noted. No leaning to one side is noted. Posture is age-appropriate and stance is narrow based. Gait shows normal stride length and slower pace, limp on the right, tandem walk is not possible.  Assessment and Plan:  In summary, [name] is a very pleasant 51 y.o.-year old female with an  underlying medical history of irritable bowel syndrome, hypertension, plantar fasciitis, carpal tunnel syndrome, fibromyalgia, bipolar disorder, kidney stones, hyperlipidemia, melanoma, ovarian cancer, cervical cancer, low back pain, neck pain, obesity and smoking who presents for follow-up consultation of her  obstructive sleep apnea. She had a previous trial of AutoPap in early 2017 but could not use it because of panic and claustrophobia. She was retested with an attended baseline sleep  study in September 2017 and then had a CPAP titration study in October 2017. We talked about her test results in detail today. She has severe PLMS throughout both studies but denies restless leg symptoms. Her sleep is interrupted due to back pain radiating to the right side and she is a restless sleeper she indicates. PLMS can also be exacerbated or induced by antidepressant medications, of note, she is on Cymbalta. She did not sleep very well both times but had benefit from CPAP therapy. She is willing to stay on treatment and overall had a better experience since starting CPAP therapy this time around. She needs supplies, she has not been able to get her nasal pillow inserts and had recurrent issues with sinus congestion after about a month of treatment. She was much better with her compliance in the month of December. She is motivated to go back on treatment, we will get in touch with her DME company regarding this issue, she says that she has called them several times without response. We will get to the bottom of this. We will make a follow-up appointment in about 6 weeks for recheck on her symptoms and compliance. She can see one of our nurse practitioners at the time and I will see her back after that routinely.   I answered all her questions today and the patient was in agreement with the plan. She is commended for her weight loss endeavors and for her effort to stop smoking. She is encouraged to fully stop smoking and continue to strive for weight loss.  I spent 25 minutes in total face-to-face time with the patient, more than 50% of which was spent in counseling and coordination of care, reviewing test results, reviewing medication and discussing or reviewing the diagnosis of OSA, back pain, PLMD, the prognosis and treatment options. Pertinent laboratory and imaging test results that were available during this visit with the patient were reviewed by me and considered in my medical decision making (see  chart for details).

## 2016-09-09 NOTE — Patient Instructions (Signed)
Your sleep study confirmed your mild to moderate obstructive sleep apnea.  You had severe leg twitching in both sleep studies, but could be related to back pain and medication effect from the Cymbalta.  You can be monitored for restless legs symptoms. Keep in mind restless legs syndrome (RLS) is associated with anemia and iron deficiency.   Please remember to try to maintain good sleep hygiene, which means: Keep a regular sleep and wake schedule, try not to exercise or have a meal within 2 hours of your bedtime, try to keep your bedroom conducive for sleep, that is, cool and dark, without light distractors such as an illuminated alarm clock, and refrain from watching TV right before sleep or in the middle of the night and do not keep the TV or radio on during the night. Also, try not to use or play on electronic devices at bedtime, such as your cell phone, tablet PC or laptop. If you like to read at bedtime on an electronic device, try to dim the background light as much as possible. Do not eat in the middle of the night.   Please continue using your CPAP regularly. While your insurance requires that you use CPAP at least 4 hours each night on 70% of the nights, I recommend, that you not skip any nights and use it throughout the night if you can. Getting used to CPAP and staying with the treatment long term does take time and patience and discipline. Untreated obstructive sleep apnea when it is moderate to severe can have an adverse impact on cardiovascular health and raise her risk for heart disease, arrhythmias, hypertension, congestive heart failure, stroke and diabetes. Untreated obstructive sleep apnea causes sleep disruption, nonrestorative sleep, and sleep deprivation. This can have an impact on your day to day functioning and cause daytime sleepiness and impairment of cognitive function, memory loss, mood disturbance, and problems focussing. Using CPAP regularly can improve these symptoms.  Keep up  the good work with your weight loss and smoking cessation! We will see you back in 6 weeks for sleep apnea check up, you can see our NP, Hoyle Sauer or [place].

## 2016-09-10 ENCOUNTER — Telehealth: Payer: Self-pay

## 2016-09-10 NOTE — Telephone Encounter (Signed)
Received this notice from [place]:  Hi Mama, Podolak should be in [place] now attached  to [place]:) We researched her account and she has  been non compliant since December, our  respiratory therapist went out to see her  yesterday and stressed compliance, she has 30  days to get compliant:) Any questions please  call:)   Thanks,   Leafy Ro at [facility]

## 2016-10-02 ENCOUNTER — Ambulatory Visit (INDEPENDENT_AMBULATORY_CARE_PROVIDER_SITE_OTHER): Payer: 59 | Admitting: Psychiatry

## 2016-10-02 ENCOUNTER — Encounter (HOSPITAL_COMMUNITY): Payer: Self-pay | Admitting: Psychiatry

## 2016-10-02 VITALS — BP 124/72 | HR 73 | Ht 61.0 in | Wt 181.8 lb

## 2016-10-02 DIAGNOSIS — G4701 Insomnia due to medical condition: Secondary | ICD-10-CM

## 2016-10-02 DIAGNOSIS — Z79899 Other long term (current) drug therapy: Secondary | ICD-10-CM | POA: Diagnosis not present

## 2016-10-02 DIAGNOSIS — F4000 Agoraphobia, unspecified: Secondary | ICD-10-CM | POA: Diagnosis not present

## 2016-10-02 DIAGNOSIS — F4310 Post-traumatic stress disorder, unspecified: Secondary | ICD-10-CM

## 2016-10-02 DIAGNOSIS — F411 Generalized anxiety disorder: Secondary | ICD-10-CM

## 2016-10-02 DIAGNOSIS — F3160 Bipolar disorder, current episode mixed, unspecified: Secondary | ICD-10-CM

## 2016-10-02 MED ORDER — TRAZODONE HCL 50 MG PO TABS: 50.0000 mg | ORAL_TABLET | Freq: Every day | ORAL | 1 refills | Status: DC

## 2016-10-02 MED ORDER — ARIPIPRAZOLE 5 MG PO TABS: 7.5000 mg | ORAL_TABLET | Freq: Every day | ORAL | 3 refills | Status: DC

## 2016-10-02 NOTE — Progress Notes (Signed)
[facility] [phone] Progress Note  [name] [id number] 51 y.o.  10/02/2016 7:43 AM  Chief Complaint: "I am not sleeping"  History of Present Illness: Pt states she has been busy making blankets for friends.  Pt is now seeing a pain specialist for her back, neck and joint pain. He stopped her Wellbutrin and put her on Cymbalta one month ago.  Pt is planning on moving to [place] in 2 yrs.  Pt is sleeping about 3-5 hrs/night. Pt is able to able to fall asleep but is not able to stay asleep. Pt is using her CPAP every night. States she needs a sleep aid. Energy is low and she is taking 2-3 hr naps each day.   Pt is not sure if she is depressed. 4 days a week she is irritable with negative thinking. She will isolate. Pt thinks it is due to pain. Denies anhedonia. Denies SI/HI.  Denies manic and hypomanic symptoms including periods of decreased need for sleep, increased energy, mood lability, impulsivity, FOI, and excessive spending.  PTSD- states it is in "remission". She has flashbacks and intrusive memories randomly. She deals by pushing them away so she doesn't have to deal with them.   Anxiety when she goes out otherwise at home no anxiety.   Pt is no longer in therapy.  Taking meds as prescribed and denies SE. Cymbalta it helps with pain at night. It is helping with mood. Her husband tells her she jokes more on Cymbalta.  Suicidal Ideation: No  Plan Formed: No Patient has means to carry out plan: No  Homicidal Ideation: No Plan Formed: No Patient has means to carry out plan: No  Review of Systems: Psychiatric: Agitation: No Hallucination: unclear pt states her house is haunted. She hears disembodied voices daily. Denies command hallucinations. States her boyfriend will hear them as well. They had a paranormal group do an investigation who found voices, knocking, shadows walking across walls when no one was home.  Depressed Mood: Yes Insomnia: Yes Hypersomnia:  No Altered Concentration: No Feels Worthless: No Grandiose Ideas: No Belief In Special Powers: No New/Increased Substance Abuse: No Compulsions: No   Neurologic: Headache: No Seizure: No Paresthesias: Yes bottom of both feet   Review of Systems  Respiratory: Negative for cough, shortness of breath and wheezing.   Musculoskeletal: Positive for back pain, falls, joint pain and neck pain.  Neurological: Positive for sensory change. Negative for dizziness, tremors, seizures, loss of consciousness and headaches.  Endo/Heme/Allergies: Negative for environmental allergies.     Past Medical, Family, Social History: Pt lives in [place] with her boyfriend. She was raised by her adoptive parents and has 4 siblings. Pt has been married 3x and is divorced. She has 2 sons. 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 Pt  reports that she has been smoking Cigarettes.  She has a 11.50 pack-year smoking history. She has never used smokeless tobacco. She reports that she does not drink alcohol or use drugs.  Family History  Problem Relation Age of Onset  . Adopted: Yes  . Bipolar disorder Mother     never diagnosed but patient suspects mother had bipolar disorder.Marland KitchenMarland KitchenMarland KitchenMarland Kitchenpt was adopeted, only knew birth mother for a short period of time.  Marland Kitchen Anxiety disorder Mother   . Suicidality Mother   . Turner syndrome Daughter   . Bipolar disorder Daughter   . ADD / ADHD Neg Hx   . Alcohol abuse Neg Hx   .  Drug abuse Neg Hx   . Dementia Neg Hx   . Depression Neg Hx   . OCD Neg Hx   . Paranoid behavior Neg Hx   . Schizophrenia Neg Hx   . Seizures Neg Hx   . Sexual abuse Neg Hx   . Physical abuse Neg Hx     Past Medical History:  Diagnosis Date  . Anxiety   . Arthritis    "pretty much all over; mainly neck and back" (12/26/2015)  . Asthma   . Bipolar 1 disorder ([place])   . Bipolar disorder ([place])   . Cervical cancer ([place])    "stage I"  . Chronic back pain   . Chronic bronchitis ([place])    . COPD (chronic obstructive pulmonary disease) ([place])   . Degenerative disc disease   . Depression   . Dysrhythmia   . Fibromyalgia   . GERD (gastroesophageal reflux disease)   . Hepatic cyst   . High cholesterol   . History of hiatal hernia   . Hypertension 08/04/2012  . IBS (irritable bowel syndrome) Diagnosed November 2012  . Incisional hernia with gangrene and obstruction 12/26/2015  . Kidney stones    "passed them"  . Melanoma of back ([place])   . Ovarian cancer ([place])    "stage II"  . Plantar fasciitis, bilateral   . Polycystic kidney disease    ruled out  . Sleep apnea    cpap coming  "soon" (12/26/2015)     Outpatient Encounter Prescriptions as of 10/02/2016  Medication Sig  . albuterol (PROVENTIL HFA;VENTOLIN HFA) 108 (90 BASE) MCG/ACT inhaler Inhale 1-2 puffs into the lungs every 6 (six) hours as needed for wheezing or shortness of breath.  Marland Kitchen amLODipine (NORVASC) 5 MG tablet Take 5 mg by mouth daily.   . ARIPiprazole (ABILIFY) 5 MG tablet Take 1.5 tablets (7.5 mg total) by mouth daily.  . DULoxetine (CYMBALTA) 30 MG capsule Take 30 mg by mouth at bedtime.  Marland Kitchen losartan-hydrochlorothiazide (HYZAAR) 50-12.5 MG tablet Take 1 tablet by mouth 2 (two) times daily.   . Multiple Vitamins-Minerals (MULTIVITAMIN GUMMIES ADULT PO) Take 2 tablets by mouth every morning.  Marland Kitchen LORazepam (ATIVAN) 1 MG tablet Take 1 tablet (1 mg total) by mouth 3 times/day as needed-between meals & bedtime for sleep. (Patient not taking: Reported on 10/02/2016)   No facility-administered encounter medications on file as of 10/02/2016.     Past Psychiatric History/Hospitalization(s): Anxiety: Yes Bipolar Disorder: Yes Depression: Yes Mania: Yes Psychosis: Yes Schizophrenia: No Personality Disorder: No Hospitalization for psychiatric illness: Yes History of Electroconvulsive Shock Therapy: No Prior Suicide Attempts: Yes  Physical Exam: Constitutional:  BP 124/72   Pulse 73   Ht 5\' 1"  (1.549 m)   Wt 181  lb 12.8 oz (82.5 kg)   BMI 34.35 kg/m   BP elevated and pt is monitoring it at home and working with her PCP  General Appearance: alert, oriented, no acute distress  Musculoskeletal: Strength & Muscle Tone: within normal limits Gait & Station: normal, walking with cane due to pain in toe Patient leans: straight  Mental Status Examination/Evaluation: reviewed MSE on 10/02/16 and same as previous visits except as noted Objective: Attitude: Calm and cooperative  Appearance: Casual, appears to be stated age  Eye Contact::  Good  Speech:  Clear and Coherent and Normal Rate  Volume:  Normal  Mood:  anxious  Affect:  Appropriate  Thought Process:  Goal Directed, Linear and Logical  Orientation:  Full (Time, Place, and  Person)  Thought Content:  Negative  Suicidal Thoughts:  No  Homicidal Thoughts:  No  Judgement:  Fair  Insight:  Fair  Concentration: good  Memory: Immediate-fair Recent-fair Remote-fair  Recall: fair  Language: fair  Gait and Station: normal  [employer] of Knowledge: average  Psychomotor Activity:  Normal  Akathisia:  No  Handed:  Right  AIMS (if indicated): n/a  Assets:  Communication Skills [employer] Talents/Skills Transportation Vocational/Educational     Reviewed A&P on 10/02/16 and same as previous visits except as noted  Assessment: Bipolar disorder- MRE mixed; GAD with panic attacks, PTSD, agorphobia; Insomnia    Treatment Plan/Recommendations:  Plan of Care: Medication management with supportive therapy. Risks/benefits and SE of the medication discussed. Pt verbalized understanding and verbal consent obtained for treatment. Affirm with the patient that the medications are taken as ordered. Patient expressed understanding of how their medications were to be used.     Laboratory:  EKG 03/20/2015 QTc 501, pt is working with a cardiologist 03/20/2015 Ca 8.8, glu 122, CBC WNL  Psychotherapy: Therapy:  brief supportive therapy provided. Discussed psychosocial stressors in detail.   Medications:   D/c Ativan as pt reports she is not taking it.  Cymbalta from pain doc  Abilify 7.5mg  po qHS for mood lability, anxiety. Pt would like to continue despite sexual side effects. Recommend she use lubrication when needed.  Start trial of Trazodone 50mg  po qHS prn insomnia. Pt advised to use with CPAP   SE of dry mouth recommend biotin and sugar free hard candies  Routine PRN Medications: No   Consultations: refer to new therapist per pt request  Safety Concerns: Pt denies SI and is at an acute low risk for suicide.Patient told to call clinic if any problems occur. Patient advised to go to ER if they should develop SI/HI, side effects, or if symptoms worsen. Has crisis numbers to call if needed. Pt verbalized understanding.    Other: F/up in 2-3 months or sooner if needed     Charlcie Cradle, MD 10/02/2016

## 2016-10-16 ENCOUNTER — Telehealth: Payer: Self-pay | Admitting: Neurology

## 2016-10-16 DIAGNOSIS — G4733 Obstructive sleep apnea (adult) (pediatric): Secondary | ICD-10-CM

## 2016-10-16 DIAGNOSIS — Z9989 Dependence on other enabling machines and devices: Principal | ICD-10-CM

## 2016-10-16 NOTE — Telephone Encounter (Signed)
Can I get a new order for nasal pillows? Small-sized ResMed Airfit P10 nasal pillows

## 2016-10-16 NOTE — Telephone Encounter (Signed)
Pt called stating she was told by [facility] that they do not have an order for new nose pillows, pt would like a call back on her mobile.

## 2016-10-16 NOTE — Addendum Note (Signed)
Addended by: Star Age on: 10/16/2016 04:28 PM   Modules accepted: Orders

## 2016-10-16 NOTE — Telephone Encounter (Signed)
cpap supply order placed

## 2016-10-20 NOTE — Telephone Encounter (Addendum)
I spoke with the patient on 3/22 and advised her that I have faxed the order in. Patient also stated that she feels that the air from the CPAP is not "blowing enough".  I pulled the compliance data and had Dr. Rexene Alberts look at it. Dr. Rexene Alberts advise that the pressure is right where it needs to be. Does not recommend any changes. I spoke with patient and gave information, also asked her to try it with her new mask and see if it makes a different. Patient voiced understanding and will call us back if needed.

## 2016-10-23 ENCOUNTER — Ambulatory Visit: Payer: Medicare Other | Admitting: Adult Health

## 2016-10-24 ENCOUNTER — Encounter: Payer: Self-pay | Admitting: Adult Health

## 2016-12-04 ENCOUNTER — Ambulatory Visit (HOSPITAL_COMMUNITY): Payer: Self-pay | Admitting: Psychiatry

## 2016-12-15 ENCOUNTER — Encounter: Payer: Self-pay | Admitting: Neurology

## 2016-12-17 ENCOUNTER — Ambulatory Visit (INDEPENDENT_AMBULATORY_CARE_PROVIDER_SITE_OTHER): Payer: Medicare Other | Admitting: Adult Health

## 2016-12-17 ENCOUNTER — Encounter: Payer: Self-pay | Admitting: Adult Health

## 2016-12-17 VITALS — BP 146/73 | HR 72 | Ht 61.0 in | Wt 181.2 lb

## 2016-12-17 DIAGNOSIS — G4733 Obstructive sleep apnea (adult) (pediatric): Secondary | ICD-10-CM | POA: Diagnosis not present

## 2016-12-17 DIAGNOSIS — Z9989 Dependence on other enabling machines and devices: Secondary | ICD-10-CM

## 2016-12-17 NOTE — Progress Notes (Addendum)
PATIENT: [name] DOB: [birth year]  REASON FOR VISIT: follow up HISTORY FROM: patient  HISTORY OF PRESENT ILLNESS: Today 12/17/2016 [name] is a 51 year old female with a history of obstructive sleep apnea. She returns today for a compliance download. Her download indicates that she uses her machine 27 out of 30 days for compliance of 90%. She uses her machine greater than 4 hours each night. On average she uses her machine 7 hours and 40 minutes. Her residual AHI is 0.3 on 9 cm water with EPR of one. She does not have a significant leak. She is currently using the nasal pillows. She reports that the CPAP does work well for her. She states that her DME company is now sending her supplies regularly. She returns today for an evaluation.  HISTORY 09/09/16 Copied from Dr. Guadelupe Sabin notes: [name] is a 51 year old right-handed woman with an underlying medical history bipolar disease, fibromyalgia, irritable bowel syndrome, hypertension, plantar fasciitis, asthma, kidney stones, hyperlipidemia, melanoma, ovarian cancer, cervical cancer, smoking, chronic upper and lower back pain, and obesity, who presents for follow-up consultation of her sleep disturbance, after her recent sleep studies. The patient is unaccompanied today. I first met her on 03/17/2016 at the request of her primary care provider, at which time she reported snoring and excessive daytime somnolence, inability to tolerate AutoPap which was paced on a home sleep test which was abnormal in May 2017. She was invited for sleep study. She had a baseline sleep study, followed by a CPAP titration study. I went over her test results with her in detail today. Baseline sleep study from 04/21/2016 showed a sleep efficiency of 78.1%, sleep latency of 16.5 minutes, REM latency of 108.5 minutes. She had absence of slow-wave sleep and REM sleep was 31.3%. Total AHI was 11.9 per hour, REM AHI was 35.2 per hour, supine AHI was 0 per hour but she did  not sleep supine for any length of time, about 4% of the study. Average oxygen saturation was 96%, nadir was 86%, time below 89% saturation was 20 minutes for the night, she had severe PLMS with mild arousals. She will return for a full night CPAP titration study on 05/26/2016. Sleep efficiency was 74.3%, sleep latency 32 minutes, REM latency was 54 minutes. REM percentage was 30.6% and slow-wave sleep was absent. CPAP was titrated from 6 cm to 9 cm. On a pressure of 9 cm her AHI was 0 per hour, she was fitted with a small nasal pillows interface. She had severe PLMS with minimal arousals. She did indicate that she slept better with CPAP. Based on her test results are prescribed CPAP therapy for home use.  Today, 09/09/2016 (all dictated new, as well as above notes, some dictation done in note pad or Word, outside of chart, may appear as copied): I reviewed her CPAP compliance data from 08/10/2016 through 09/08/2016 which is a total of 30 days, during which time she used her CPAP only 6 days with percent used days greater than 4 hours at 3%, indicating noncompliance. Average usage of 2 hours and 36 minutes for days on treatment. AHI 0.3 per hour, leak low, pressure of 9 cm. In the past 60 days she used her CPAP 31 days with percent used days greater than 4 hours at 32%, indicating poor compliance. She reports not having replacements for the nasal pillows, and has had recurrent sinus issues, has reduced her smoking and trying to lose weight. Lost about 18 lb in 6 months.  Lives with fiance, eliminated sodas. Better experience overall compared to previous trial on autoPAP. Has FFM from before, but tried it again and had panic attack, cannot use FFM. No longer on Norco, sees Dr. Canary Brim, getting her first round of SI injection this week. Denies frank restless leg symptoms but is restless at night due to back pain primarily, sometimes she has other joint pain but mostly right-sided back pain with radiation to the  right thigh.  The patient's allergies, current medications, family history, past medical history, past social history, past surgical history and problem list were reviewed and updated as appropriate.   Previously (copied from previous notes for reference):   03/17/2016: She reports snoring and excessive daytime somnolence and inability to tolerate AutoPap. I reviewed your office note from 11/29/2015, which you kindly included. She had a home sleep test on 11/30/2015 which indicated an RDI of 50.4 per hour. Average oxygen saturation was 91%, lowest was 84%. Since then she was placed on AutoPap. She has not been able to tolerateIt unfortunately, she reports feeling of panic, claustrophobia, has been using a fullface mask. She tried the nasal pillows and did not like them either. I reviewed an AutoPap compliance report from 01/06/2016 through 02/04/2016, she used her machine 3 out of 30 days only. She goes to bed around 9-10 PM, she wakes up around 5-7 AM. She has nocturia about 4/night.  She is divorced and has 2 daughters, no grandchildren (yet!).  She lives with her boyfriend, Dominica Severin. She smokes a half pack per day, quit drinking alcohol in 1998, quit smoking marijuana in 2000. She drinks a lot of caffeine, mostly in the form of soda, regular [facility], 4-6 cans per day. She denies restless leg symptoms or morning headaches.  REVIEW OF SYSTEMS: Out of a complete 14 system review of symptoms, the patient complains only of the following symptoms, and all other reviewed systems are negative.  Fatigue, hearing loss, ringing in ears, eye itching, light sensitivity, blurred vision, cough, wheezing, shortness of breath, heat intolerance, excessive thirst, diarrhea, apnea, daytime sleepiness, snoring, moles, itching, neck stiffness, neck pain, walking difficulty, muscle cramps, aching muscles, back pain, joint pain, joint swelling, urgency, frequency of urination, incontinence of bladder, environmental  allergies, numbness, depression, nervous/anxious, hyperactive  Fatigue severity score is 57 and epworth sleepiness score is 8  ALLERGIES: Allergies  Allergen Reactions  . Barium-Containing Compounds Other (See Comments)    Stomach cramps, extreme diarrhea, and vomiting  . Bee Venom Anaphylaxis  . Seldane [Terfenadine] Nausea And Vomiting and Other (See Comments)    CAUSES TOP LAYER OF SKIN TO PEEL  . Sulfa Antibiotics Anaphylaxis  . Lisinopril Cough  . Lithium Other (See Comments)    Agitation and hostility  . Tetracyclines & Related Hives and Nausea And Vomiting  . Aspirin Rash and Other (See Comments)    Upset stomach  . Ativan [Lorazepam] Other (See Comments)    Irritability  . Erythromycin Nausea And Vomiting, Rash and Other (See Comments)    Cramps  . Lipitor [Atorvastatin] Nausea And Vomiting    HOME MEDICATIONS: Outpatient Medications Prior to Visit  Medication Sig Dispense Refill  . albuterol (PROVENTIL HFA;VENTOLIN HFA) 108 (90 BASE) MCG/ACT inhaler Inhale 1-2 puffs into the lungs every 6 (six) hours as needed for wheezing or shortness of breath.    Marland Kitchen amLODipine (NORVASC) 5 MG tablet Take 5 mg by mouth daily.     . ARIPiprazole (ABILIFY) 5 MG tablet Take 1.5 tablets (7.5 mg total)  by mouth daily. 45 tablet 3  . DULoxetine (CYMBALTA) 30 MG capsule Take 30 mg by mouth at bedtime.    Marland Kitchen losartan-hydrochlorothiazide (HYZAAR) 50-12.5 MG tablet Take 1 tablet by mouth 2 (two) times daily.     . Multiple Vitamins-Minerals (MULTIVITAMIN GUMMIES ADULT PO) Take 2 tablets by mouth every morning.    . traZODone (DESYREL) 50 MG tablet Take 1 tablet (50 mg total) by mouth at bedtime. 30 tablet 1   No facility-administered medications prior to visit.     PAST MEDICAL HISTORY: Past Medical History:  Diagnosis Date  . Anxiety   . Arthritis    "pretty much all over; mainly neck and back" (12/26/2015)  . Asthma   . Bipolar 1 disorder ([place])   . Bipolar disorder ([place])   . Cervical  cancer ([place])    "stage I"  . Chronic back pain   . Chronic bronchitis ([place])   . COPD (chronic obstructive pulmonary disease) ([place])   . Degenerative disc disease   . Depression   . Dysrhythmia   . Fibromyalgia   . GERD (gastroesophageal reflux disease)   . Hepatic cyst   . High cholesterol   . History of hiatal hernia   . Hypertension 08/04/2012  . IBS (irritable bowel syndrome) Diagnosed November 2012  . Incisional hernia with gangrene and obstruction 12/26/2015  . Kidney stones    "passed them"  . Melanoma of back ([place])   . Ovarian cancer ([place])    "stage II"  . Plantar fasciitis, bilateral   . Polycystic kidney disease    ruled out  . Sleep apnea    cpap coming  "soon" (12/26/2015)    PAST SURGICAL HISTORY: Past Surgical History:  Procedure Laterality Date  . ABDOMINAL HYSTERECTOMY  1997  . APPENDECTOMY    . CARDIAC CATHETERIZATION  2015  . CARPAL TUNNEL RELEASE Right   . CESAREAN SECTION  1987  . CHOLECYSTECTOMY OPEN  1998  . COLONOSCOPY  November 2012  . HERNIA REPAIR    . INCISIONAL HERNIA REPAIR N/A 12/26/2015   Procedure: LAPAROSCOPIC REPAIR INCISIONAL HERNIA WITH MESH;  Surgeon: Fanny Skates, MD;  Location: [facility];  Service: General;  Laterality: N/A;  . INSERTION OF MESH N/A 12/26/2015   Procedure: INSERTION OF MESH;  Surgeon: Fanny Skates, MD;  Location: [facility];  Service: General;  Laterality: N/A;  . [place]  . LAPAROSCOPIC INCISIONAL / UMBILICAL / VENTRAL HERNIA REPAIR  12/26/2015   repair of incarcerated incisional hernia with mesh  . LIVER CYST REMOVAL  2000s  . MELANOMA EXCISION  January 2013   removed from back  . SHOULDER SURGERY Left 2010   Humeral Head Microfracture   . TUBAL LIGATION    . UPPER GASTROINTESTINAL ENDOSCOPY  November 2012    FAMILY HISTORY: Family History  Problem Relation Age of Onset  . Adopted: Yes  . Bipolar disorder Mother        never diagnosed but patient suspects mother had bipolar  disorder.Marland KitchenMarland KitchenMarland KitchenMarland Kitchenpt was adopeted, only knew birth mother for a short period of time.  Marland Kitchen Anxiety disorder Mother   . Suicidality Mother   . Turner syndrome Daughter   . Bipolar disorder Daughter   . ADD / ADHD Neg Hx   . Alcohol abuse Neg Hx   . Drug abuse Neg Hx   . Dementia Neg Hx   . Depression Neg Hx   . OCD Neg Hx   . Paranoid behavior Neg Hx   .  Schizophrenia Neg Hx   . Seizures Neg Hx   . Sexual abuse Neg Hx   . Physical abuse Neg Hx     SOCIAL HISTORY: Social History   Social History  . Marital status: Significant Other    Spouse name: N/A  . Number of children: 2  . Years of education: 45 1/2   Occupational History  .      disabled   Social History Main Topics  . Smoking status: Current Every Day Smoker    Packs/day: 0.25    Years: 46.00    Types: Cigarettes  . Smokeless tobacco: Never Used  . Alcohol use No     Comment: 12/26/2015 'quit in ~ 1997"  . Drug use: No     Comment: 12/26/2015 "quit in ~  2010"  . Sexual activity: Yes   Other Topics Concern  . Not on file   Social History Narrative   Lives with boyfriend   Caffeine use- drinks "several sodas a day"      PHYSICAL EXAM  Vitals:   12/17/16 0953  BP: (!) 146/73  Pulse: 72  Weight: 181 lb 3.2 oz (82.2 kg)  Height: _0  (1.549 m)   Body mass index is 34.24 kg/m.  Generalized: Well developed, in no acute distress   Neurological examination  Mentation: Alert oriented to time, place, history taking. Follows all commands speech and language fluent Cranial nerve II-XII: Pupils were equal round reactive to light. Extraocular movements were full, visual field were full on confrontational test. Facial sensation and strength were normal. Uvula tongue midline. Head turning and shoulder shrug  were normal and symmetric. Motor: The motor testing reveals 5 over 5 strength of all 4 extremities. Good symmetric motor tone is noted throughout.  Sensory: Sensory testing is intact to soft touch on all 4  extremities. No evidence of extinction is noted.  Coordination: Cerebellar testing reveals good finger-nose-finger and heel-to-shin bilaterally.  Gait and station: Patient has a slightly an due to a broken toe. Reflexes: Deep tendon reflexes are symmetric and normal bilaterally.   DIAGNOSTIC DATA (LABS, IMAGING, TESTING) - I reviewed patient records, labs, notes, testing and imaging myself where available.  Lab Results  Component Value Date   WBC 10.2 12/25/2015   HGB 14.1 12/25/2015   HCT 44.5 12/25/2015   MCV 93.9 12/25/2015   PLT 343 12/25/2015      Component Value Date/Time   NA 142 12/25/2015 0827   K 3.7 12/25/2015 0827   CL 106 12/25/2015 0827   CO2 29 12/25/2015 0827   GLUCOSE 111 (H) 12/25/2015 0827   BUN 9 12/25/2015 0827   CREATININE 0.97 12/25/2015 0827   CREATININE 0.74 02/08/2014 0723   CALCIUM 9.2 12/25/2015 0827   PROT 7.2 12/25/2015 0827   ALBUMIN 3.6 12/25/2015 0827   AST 20 12/25/2015 0827   ALT 26 12/25/2015 0827   ALKPHOS 79 12/25/2015 0827   BILITOT 0.7 12/25/2015 0827   GFRNONAA >60 12/25/2015 0827   GFRNONAA >89 02/08/2014 0723   GFRAA >60 12/25/2015 0827   GFRAA >89 02/08/2014 0723    Lab Results  Component Value Date   HGBA1C 6.0 (H) 02/08/2014   No results found for: [id number] Lab Results  Component Value Date   TSH 2.243 02/08/2014      ASSESSMENT AND PLAN 51 y.o. year old female  has a past medical history of Anxiety; Arthritis; Asthma; Bipolar 1 disorder ([place]); Bipolar disorder ([place]); Cervical cancer ([place]); Chronic back pain; Chronic  bronchitis ([place]); COPD (chronic obstructive pulmonary disease) ([place]); Degenerative disc disease; Depression; Dysrhythmia; Fibromyalgia; GERD (gastroesophageal reflux disease); Hepatic cyst; High cholesterol; History of hiatal hernia; Hypertension (08/04/2012); IBS (irritable bowel syndrome) (Diagnosed November 2012); Incisional hernia with gangrene and obstruction (12/26/2015); Kidney stones; Melanoma of  back ([place]); Ovarian cancer ([place]); Plantar fasciitis, bilateral; Polycystic kidney disease; and Sleep apnea. here with:  1. Obstructive sleep apnea on CPAP  Patient's CPAP download shows that her compliance and good treatment of her apnea. Patient is encouraged to continue using the CPAP nightly. She is advised that if her symptoms worsen or she develops new symptoms she should let us know. She will follow-up in 6 months with Dr. Rexene Alberts  I spent 15 minutes with the patient 50% this time was spent counseling the patient on her CPAP download.    Ward Givens, MSN, NP-C 12/17/2016, 10:01 AM [facility] [address] [place], [place] [address] [phone]  I reviewed the above note and documentation by the Nurse Practitioner and agree with the history, physical exam, assessment and plan as outlined above. I was immediately available for face-to-face consultation. Star Age, MD, PhD [facility])

## 2016-12-17 NOTE — Patient Instructions (Signed)
Continue using CPAP nightly >4 hours each night If your symptoms worsen or you develop new symptoms please let us know.

## 2017-01-12 ENCOUNTER — Encounter: Payer: Self-pay | Admitting: Adult Health

## 2017-01-13 ENCOUNTER — Telehealth: Payer: Self-pay | Admitting: Adult Health

## 2017-01-13 DIAGNOSIS — Z9989 Dependence on other enabling machines and devices: Principal | ICD-10-CM

## 2017-01-13 DIAGNOSIS — G4733 Obstructive sleep apnea (adult) (pediatric): Secondary | ICD-10-CM

## 2017-01-13 NOTE — Telephone Encounter (Signed)
Pt is congested over the past several days and has not used CPAP. She said when she does use the CPAP she is more congested. Please call

## 2017-01-13 NOTE — Addendum Note (Signed)
Addended by: Star Age on: 01/13/2017 02:21 PM   Modules accepted: Orders

## 2017-01-13 NOTE — Telephone Encounter (Signed)
I called pt. She has woken up with congestion after using the cpap for the past 2-3 weeks. Pt reports changing her filter every 2 weeks and washing her water chamber, mask, and hose frequently. Pt denies being bothered by a cold or allergies recently. Pt is wondering if this is cpap related and if she can do anything else about it.

## 2017-01-13 NOTE — Telephone Encounter (Signed)
I called pt to discuss. I advised her that Dr. Rexene Alberts does not think her symptoms are related to her cpap. Pt is agreeable to a reduction in cpap pressure and keeping cleaning her machine as instructed. Will send order to [facility]. Pt verbalized understanding.

## 2017-01-13 NOTE — Telephone Encounter (Signed)
Symptoms are likely unrelated, especially since she takes care of cleaning the equipment well. She is encouraged to talk to DME about wipes for cleaning the equipment. Her compliance data for the past month indicates good compliance. AHI is low at 0.4 per hour, leak very low. We can try to reduce her pressure some to see if it may help reduce nasal congestion. I will place an order for pressure reduction to 8 cm from currently 9 cm.  Please process order and notify pt. thx

## 2017-01-22 ENCOUNTER — Ambulatory Visit (INDEPENDENT_AMBULATORY_CARE_PROVIDER_SITE_OTHER): Payer: 59 | Admitting: Psychiatry

## 2017-01-22 ENCOUNTER — Encounter (HOSPITAL_COMMUNITY): Payer: Self-pay | Admitting: Psychiatry

## 2017-01-22 DIAGNOSIS — F319 Bipolar disorder, unspecified: Secondary | ICD-10-CM

## 2017-01-22 DIAGNOSIS — G4701 Insomnia due to medical condition: Secondary | ICD-10-CM

## 2017-01-22 DIAGNOSIS — F17210 Nicotine dependence, cigarettes, uncomplicated: Secondary | ICD-10-CM

## 2017-01-22 DIAGNOSIS — F411 Generalized anxiety disorder: Secondary | ICD-10-CM | POA: Diagnosis not present

## 2017-01-22 DIAGNOSIS — Z818 Family history of other mental and behavioral disorders: Secondary | ICD-10-CM

## 2017-01-22 DIAGNOSIS — F4000 Agoraphobia, unspecified: Secondary | ICD-10-CM

## 2017-01-22 DIAGNOSIS — Z881 Allergy status to other antibiotic agents status: Secondary | ICD-10-CM

## 2017-01-22 DIAGNOSIS — Z888 Allergy status to other drugs, medicaments and biological substances status: Secondary | ICD-10-CM

## 2017-01-22 DIAGNOSIS — F4001 Agoraphobia with panic disorder: Secondary | ICD-10-CM | POA: Diagnosis not present

## 2017-01-22 DIAGNOSIS — Z882 Allergy status to sulfonamides status: Secondary | ICD-10-CM

## 2017-01-22 DIAGNOSIS — Z79899 Other long term (current) drug therapy: Secondary | ICD-10-CM

## 2017-01-22 DIAGNOSIS — Z886 Allergy status to analgesic agent status: Secondary | ICD-10-CM | POA: Diagnosis not present

## 2017-01-22 DIAGNOSIS — G4700 Insomnia, unspecified: Secondary | ICD-10-CM | POA: Diagnosis not present

## 2017-01-22 DIAGNOSIS — F4310 Post-traumatic stress disorder, unspecified: Secondary | ICD-10-CM

## 2017-01-22 DIAGNOSIS — Z91030 Bee allergy status: Secondary | ICD-10-CM

## 2017-01-22 DIAGNOSIS — F3160 Bipolar disorder, current episode mixed, unspecified: Secondary | ICD-10-CM

## 2017-01-22 MED ORDER — ARIPIPRAZOLE 10 MG PO TABS: 10.0000 mg | ORAL_TABLET | Freq: Every day | ORAL | 2 refills | Status: DC

## 2017-01-22 MED ORDER — TRAZODONE HCL 50 MG PO TABS: 50.0000 mg | ORAL_TABLET | Freq: Every day | ORAL | 2 refills | Status: DC
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
## 2017-02-19 ENCOUNTER — Emergency Department (HOSPITAL_COMMUNITY)
Admission: EM | Admit: 2017-02-19 | Discharge: 2017-02-19 | Disposition: A | Discharge status: Home / Self Care | Payer: Medicare Other | Attending: Emergency Medicine | Admitting: Emergency Medicine

## 2017-02-19 ENCOUNTER — Emergency Department (HOSPITAL_COMMUNITY): Payer: Medicare Other

## 2017-02-19 ENCOUNTER — Encounter (HOSPITAL_COMMUNITY): Payer: Self-pay | Admitting: Emergency Medicine

## 2017-02-19 DIAGNOSIS — R600 Localized edema: Secondary | ICD-10-CM | POA: Diagnosis present

## 2017-02-19 DIAGNOSIS — Z5321 Procedure and treatment not carried out due to patient leaving prior to being seen by health care provider: Secondary | ICD-10-CM | POA: Diagnosis not present

## 2017-02-19 LAB — BASIC METABOLIC PANEL
Anion gap: 9 (ref 5–15)
BUN: 10 mg/dL (ref 6–20)
CHLORIDE: 107 mmol/L (ref 101–111)
CO2: 27 mmol/L (ref 22–32)
CREATININE: 0.72 mg/dL (ref 0.44–1.00)
Calcium: 9.4 mg/dL (ref 8.9–10.3)
GFR calc Af Amer: >60 mL/min (ref >60)
GLUCOSE: 101 mg/dL — AB (ref 65–99)
POTASSIUM: 3.8 mmol/L (ref 3.5–5.1)
SODIUM: 143 mmol/L (ref 135–145)

## 2017-02-19 LAB — CBC WITH DIFFERENTIAL/PLATELET
Basophils Absolute: 0.1 10*3/uL (ref 0.0–0.1)
Basophils Relative: 1 %
EOS ABS: 0.4 10*3/uL (ref 0.0–0.7)
EOS PCT: 3 %
HCT: 45.3 % (ref 36.0–46.0)
Hemoglobin: 14.7 g/dL (ref 12.0–15.0)
LYMPHS ABS: 4.5 10*3/uL — AB (ref 0.7–4.0)
LYMPHS PCT: 34 %
MCH: 29.2 pg (ref 26.0–34.0)
MCHC: 32.5 g/dL (ref 30.0–36.0)
MCV: 89.9 fL (ref 78.0–100.0)
MONOS PCT: 5 %
Monocytes Absolute: 0.7 10*3/uL (ref 0.1–1.0)
Neutro Abs: 7.7 10*3/uL (ref 1.7–7.7)
Neutrophils Relative %: 58 %
PLATELETS: 322 10*3/uL (ref 150–400)
RBC: 5.04 MIL/uL (ref 3.87–5.11)
RDW: 13.5 % (ref 11.5–15.5)
WBC: 13.3 10*3/uL — ABNORMAL HIGH (ref 4.0–10.5)

## 2017-02-19 NOTE — ED Triage Notes (Signed)
Right lower leg pain/swelling x 2 weeks.

## 2017-04-16 DIAGNOSIS — M533 Sacrococcygeal disorders, not elsewhere classified: Secondary | ICD-10-CM | POA: Insufficient documentation

## 2017-04-16 DIAGNOSIS — M797 Fibromyalgia: Secondary | ICD-10-CM | POA: Insufficient documentation

## 2017-04-23 ENCOUNTER — Ambulatory Visit (HOSPITAL_COMMUNITY): Payer: Self-pay | Admitting: Psychiatry

## 2017-04-30 ENCOUNTER — Ambulatory Visit (HOSPITAL_COMMUNITY): Payer: 59 | Admitting: Psychiatry

## 2017-05-05 ENCOUNTER — Other Ambulatory Visit (HOSPITAL_COMMUNITY): Payer: Self-pay

## 2017-05-05 MED ORDER — PRAZOSIN HCL 1 MG PO CAPS: 1.0000 mg | ORAL_CAPSULE | Freq: Every day | ORAL | 0 refills | Status: DC

## 2017-05-28 ENCOUNTER — Ambulatory Visit (HOSPITAL_COMMUNITY): Payer: Self-pay | Admitting: Psychiatry

## 2017-06-11 ENCOUNTER — Telehealth: Payer: Self-pay

## 2017-06-11 NOTE — Telephone Encounter (Signed)
I called pt. Her cpap has not updated since July. Pt has confirmed that she has not used her cpap since July because it makes her sick. Pt cannot make her 06/15/17 appt due to being out of town. Pt will call us back when she is ready to reschedule it. Appt on 06/15/17 cancelled.

## 2017-06-15 ENCOUNTER — Ambulatory Visit: Payer: Medicare Other | Admitting: Neurology

## 2017-06-16 ENCOUNTER — Other Ambulatory Visit: Payer: Self-pay | Admitting: Nurse Practitioner

## 2017-06-16 ENCOUNTER — Ambulatory Visit
Admission: RE | Admit: 2017-06-16 | Discharge: 2017-06-16 | Disposition: A | Discharge status: Home / Self Care | Payer: Medicare Other | Source: Ambulatory Visit | Attending: Nurse Practitioner | Admitting: Nurse Practitioner

## 2017-06-16 DIAGNOSIS — R109 Unspecified abdominal pain: Secondary | ICD-10-CM

## 2017-06-16 DIAGNOSIS — R319 Hematuria, unspecified: Secondary | ICD-10-CM

## 2017-06-26 ENCOUNTER — Encounter: Payer: Self-pay | Admitting: Gastroenterology

## 2017-08-14 ENCOUNTER — Ambulatory Visit (INDEPENDENT_AMBULATORY_CARE_PROVIDER_SITE_OTHER): Payer: Medicare Other | Admitting: Urology

## 2017-08-14 DIAGNOSIS — N281 Cyst of kidney, acquired: Secondary | ICD-10-CM

## 2017-08-14 DIAGNOSIS — N3946 Mixed incontinence: Secondary | ICD-10-CM | POA: Diagnosis not present

## 2017-08-14 DIAGNOSIS — N952 Postmenopausal atrophic vaginitis: Secondary | ICD-10-CM

## 2017-08-14 DIAGNOSIS — R310 Gross hematuria: Secondary | ICD-10-CM

## 2017-08-18 ENCOUNTER — Telehealth: Payer: Self-pay

## 2017-08-18 ENCOUNTER — Ambulatory Visit (INDEPENDENT_AMBULATORY_CARE_PROVIDER_SITE_OTHER): Payer: Medicare Other | Admitting: Nurse Practitioner

## 2017-08-18 ENCOUNTER — Other Ambulatory Visit: Payer: Self-pay

## 2017-08-18 ENCOUNTER — Encounter: Payer: Self-pay | Admitting: Nurse Practitioner

## 2017-08-18 DIAGNOSIS — K7689 Other specified diseases of liver: Secondary | ICD-10-CM

## 2017-08-18 DIAGNOSIS — R1011 Right upper quadrant pain: Secondary | ICD-10-CM

## 2017-08-18 DIAGNOSIS — R109 Unspecified abdominal pain: Secondary | ICD-10-CM | POA: Insufficient documentation

## 2017-08-18 NOTE — Telephone Encounter (Signed)
Called [facility] for referral to Dr. Crisoforo Oxford for hepatic cyst. Appt scheduled for 09/04/17 at 2:30pm. They will mail packet to pt. Called and informed pt.  Referral info faxed to [facility] [phone]).

## 2017-08-18 NOTE — Assessment & Plan Note (Signed)
Patient has had a interval enlargement of her previously untouched 5 cm cyst.  It is now up to approximately 7-1/2 cm.  Her previous 10 cm cyst was resected by Dr. Kathrynn Running at [facility] division of general surgery.  The patient was symptomatic in 2014 before her previous resection and her symptoms resolved afterward.  She is now having recurrent right upper quadrant symptoms which she states feels identical to what she previously had prior to her resection, although the intensity of the pain is at times worse than it was previously.  CT scans reviewed, surgical note [facility].  At this point given her symptoms and her known hepatic cyst with significant interval enlargement we will refer her back to the surgeon to consider resection of this second cyst.  On a positive note, there is no other development of new cyst.  Follow-up with our office in 6 months.

## 2017-08-18 NOTE — Telephone Encounter (Signed)
[facility] requested disk of CT abd/pelvis that was done 06/16/17 at [facility]. Called [facility]. The will mail disk to [facility].

## 2017-08-18 NOTE — Patient Instructions (Signed)
1. We will refer you back to the surgeon at [facility]. 2. We will request her previous colonoscopy from Dr. Truman Hayward at [facility]. 3. Return for follow-up in 6 months. 4. Call us if you have any questions or concerns.

## 2017-08-18 NOTE — Assessment & Plan Note (Signed)
The patient describes right upper quadrant pain which is worse on palpation during her exam.  She has had to alter the clothes that she wears in order to prevent and lessen her pain.  She does have lower abdominal pain associated with IBS D, this is controlled with outpatient over-the-counter medications.  This right upper quadrant pain is new, similar to the pain she had prior to her previous cyst resection.  She states it can be intense at times.  Further management of known hepatic cyst with interval enlargement as per above.  Follow-up in 6 months.  Of note, related to IBS D she has had her gallbladder out and is not a candidate for [facility].

## 2017-08-18 NOTE — Progress Notes (Signed)
Primary Care Physician:  Berkley Harvey, NP Primary Gastroenterologist:  Dr. Oneida Alar  Chief Complaint  Patient presents with  . hepatic cyst    HPI:   [name] is a 52 y.o. female who presents on referral from primary care for evaluation of hepatic cyst.  The patient visit note included with the referral occurred 06/23/2017.  At that point she was there for follow-up for right flank pain which is noted to be not as severe and for which she was treated with UTI with Cipro.  CT scan did not show any urolithiasis but it did show enlarging liver cyst.  CT scan completed 06/16/2017 noted interval postoperative changes to the right hepatic lobe for resection of a previously demonstrated large cyst with a 1.1 cm low-density focus remaining in that region.  Segment 4 cyst has enlarged and now measures 7.4 x 6.5 cm (previously 4.6 x 3.9 cm).  No biliary dilation status post cholecystectomy.  Review of CT scan completed in 2013 notes the surgically resected cyst at that time measured 7.5 x 9.1 cm.  Review of care everywhere found a surgical outpatient visit dated 09/04/2012 when the patient was having symptomatic abdominal pain and noted 2 large liver cysts 1 approximately 10 cm and 1 approximately 5 cm.  Because of her symptoms she was offered laparoscopic resection of the larger cyst.  Surgery is scheduled for 09/20/2012.  She had significant improvement in her abdominal pain noted during her office visit 4 weeks postoperatively.  Microscopic examination revealed a cyst to be a benign simple cyst.  Recommended follow-up as needed.  Today she states she's doing ok overall, has chronic pain due to cervical and lumbar DDD s/p crush injury with C3-5 as a child. She is having more abdominal pain to the point that she cannot wear clothes that ride up to her upper abdomen or it causes worsening pain; needs to wear elastic waistbands as well. Describes her pain as RUQ, cramping "like if my ribcage could  pop, it would pop but just below and underneath my ribcage." States her pain feels the same as her pain previous to cyst resection, but at times it is worse. States pain is worse with abdominal palpation. Has chronic diarrhea and associated lower abdominal cramping. Every bowel movement is diarrhea and she takes Imodium AD which works sometimes (depends on what she eats); admits lactose intolerance and admits dietary indiscretion. Denies hematochezia, melena, fever, chills, unintentional weight loss. Had a colonoscopy at [facility] and thinks she may be due again but is wanting to hold off until resolution of her current symptoms; Last completed 5 years ago. Denies chest pain, dyspnea, dizziness, lightheadedness, syncope, near syncope. Denies any other upper or lower GI symptoms.  Past Medical History:  Diagnosis Date  . Anxiety   . Arthritis    "pretty much all over; mainly neck and back" (12/26/2015)  . Asthma   . Bipolar 1 disorder ([place])   . Bipolar disorder ([place])   . Cervical cancer ([place])    "stage I"  . Chronic back pain   . Chronic bronchitis ([place])   . COPD (chronic obstructive pulmonary disease) ([place])   . Degenerative disc disease   . Depression   . Dysrhythmia   . Fibromyalgia   . GERD (gastroesophageal reflux disease)   . Hepatic cyst   . High cholesterol   . History of hiatal hernia   . Hypertension 08/04/2012  . IBS (irritable bowel syndrome) Diagnosed November 2012  .  Incisional hernia with gangrene and obstruction 12/26/2015  . Kidney stones    "passed them"  . Melanoma of back ([place])   . Ovarian cancer ([place])    "stage II"  . Plantar fasciitis, bilateral   . Polycystic kidney disease    ruled out  . Sleep apnea    cpap coming  "soon" (12/26/2015)    Past Surgical History:  Procedure Laterality Date  . ABDOMINAL HYSTERECTOMY  1997  . APPENDECTOMY    . CARDIAC CATHETERIZATION  2015  . CARPAL TUNNEL RELEASE Right   . CESAREAN SECTION  1987  . CHOLECYSTECTOMY OPEN   1998  . COLONOSCOPY  November 2012  . HERNIA REPAIR    . INCISIONAL HERNIA REPAIR N/A 12/26/2015   Procedure: LAPAROSCOPIC REPAIR INCISIONAL HERNIA WITH MESH;  Surgeon: Fanny Skates, MD;  Location: [facility];  Service: General;  Laterality: N/A;  . INSERTION OF MESH N/A 12/26/2015   Procedure: INSERTION OF MESH;  Surgeon: Fanny Skates, MD;  Location: [facility];  Service: General;  Laterality: N/A;  . [place]  . LAPAROSCOPIC INCISIONAL / UMBILICAL / VENTRAL HERNIA REPAIR  12/26/2015   repair of incarcerated incisional hernia with mesh  . LIVER CYST REMOVAL  2000s  . MELANOMA EXCISION  January 2013   removed from back  . SHOULDER SURGERY Left 2010   Humeral Head Microfracture   . TUBAL LIGATION    . UPPER GASTROINTESTINAL ENDOSCOPY  November 2012    Current Outpatient Medications  Medication Sig Dispense Refill  . albuterol (PROVENTIL HFA;VENTOLIN HFA) 108 (90 BASE) MCG/ACT inhaler Inhale 1-2 puffs into the lungs every 6 (six) hours as needed for wheezing or shortness of breath.    . fluticasone furoate-vilanterol (BREO ELLIPTA) 100-25 MCG/INH AEPB Inhale 1 puff into the lungs daily.    Marland Kitchen losartan (COZAAR) 50 MG tablet Take 50 mg by mouth daily.    . DULoxetine (CYMBALTA) 30 MG capsule Take 30 mg by mouth at bedtime.     No current facility-administered medications for this visit.     Allergies as of 08/18/2017 - Review Complete 08/18/2017  Allergen Reaction Noted  . Barium-containing compounds Other (See Comments) 08/11/2012  . Bee venom Anaphylaxis 09/30/2011  . Seldane [terfenadine] Nausea And Vomiting and Other (See Comments) 05/26/2011  . Sulfa antibiotics Anaphylaxis 05/26/2011  . Lisinopril Cough 10/22/2015  . Lithium Other (See Comments) 06/30/2012  . Tetracyclines & related Hives and Nausea And Vomiting 05/26/2011  . Aspirin Rash and Other (See Comments) 05/26/2011  . Ativan [lorazepam] Other (See Comments) 12/18/2015  . Erythromycin Nausea And  Vomiting, Rash, and Other (See Comments) 05/26/2011  . Lipitor [atorvastatin] Nausea And Vomiting 12/18/2015    Family History  Adopted: Yes  Problem Relation Age of Onset  . Bipolar disorder Mother        never diagnosed but patient suspects mother had bipolar disorder.Marland KitchenMarland KitchenMarland KitchenMarland Kitchenpt was adopeted, only knew birth mother for a short period of time.  Marland Kitchen Anxiety disorder Mother   . Suicidality Mother   . Turner syndrome Daughter   . Bipolar disorder Daughter   . ADD / ADHD Neg Hx   . Alcohol abuse Neg Hx   . Drug abuse Neg Hx   . Dementia Neg Hx   . Depression Neg Hx   . OCD Neg Hx   . Paranoid behavior Neg Hx   . Schizophrenia Neg Hx   . Seizures Neg Hx   . Sexual abuse Neg Hx   . Physical  abuse Neg Hx   . Colon cancer Neg Hx     Social History   Socioeconomic History  . Marital status: Significant Other    Spouse name: Not on file  . Number of children: 2  . Years of education: 17 1/2  . Highest education level: Not on file  Social Needs  . Financial resource strain: Not on file  . Food insecurity - worry: Not on file  . Food insecurity - inability: Not on file  . Transportation needs - medical: Not on file  . Transportation needs - non-medical: Not on file  Occupational History    Comment: disabled  Tobacco Use  . Smoking status: Current Every Day Smoker    Packs/day: 0.25    Years: 46.00    Pack years: 11.50    Types: Cigarettes  . Smokeless tobacco: Never Used  Substance and Sexual Activity  . Alcohol use: No    Alcohol/week: 0.0 oz    Comment: 12/26/2015 'quit in ~ 1997"  . Drug use: No    Comment: 12/26/2015 "quit in ~  2010"  . Sexual activity: Yes  Other Topics Concern  . Not on file  Social History Narrative   Lives with boyfriend   Caffeine use- drinks "several sodas a day"    Review of Systems: Complete ROS negative except as per HPI.    Physical Exam: BP (!) 160/94   Pulse 72   Temp (!) 97.4 F (36.3 C) (Oral)   Ht 5\' 1"  (1.549 m)   Wt 180 lb  9.6 oz (81.9 kg)   BMI 34.12 kg/m  General:   Obese female. Alert and oriented. Pleasant and cooperative. Well-nourished and well-developed.  Eyes:  Without icterus, sclera clear and conjunctiva pink.  Ears:  Normal auditory acuity. Cardiovascular:  S1, S2 present without murmurs appreciated. Extremities without clubbing or edema. Respiratory:  Clear to auscultation bilaterally. No wheezes, rales, or rhonchi. No distress.  Gastrointestinal:  +BS, rounded but soft, and non-distended. Moderate RUQ pain to palpation. No HSM noted. No guarding or rebound. No masses appreciated.  Rectal:  Deferred  Musculoskalatal:  Symmetrical without gross deformities. Neurologic:  Alert and oriented x4;  grossly normal neurologically. Psych:  Alert and cooperative. Normal mood and affect. Heme/Lymph/Immune: No excessive bruising noted.    08/18/2017 9:18 AM   Disclaimer: This note was dictated with voice recognition software. Similar sounding words can inadvertently be transcribed and may not be corrected upon review.

## 2017-08-18 NOTE — Progress Notes (Signed)
cc'd to pcp 

## 2017-10-01 ENCOUNTER — Other Ambulatory Visit: Payer: Self-pay

## 2017-10-01 ENCOUNTER — Telehealth: Payer: Self-pay

## 2017-10-01 ENCOUNTER — Other Ambulatory Visit (HOSPITAL_COMMUNITY)
Admission: RE | Admit: 2017-10-01 | Discharge: 2017-10-01 | Disposition: A | Discharge status: Home / Self Care | Payer: Medicare Other | Source: Ambulatory Visit | Attending: Family Medicine | Admitting: Family Medicine

## 2017-10-01 ENCOUNTER — Ambulatory Visit (INDEPENDENT_AMBULATORY_CARE_PROVIDER_SITE_OTHER): Payer: Medicare Other | Admitting: Family Medicine

## 2017-10-01 ENCOUNTER — Encounter: Payer: Self-pay | Admitting: Family Medicine

## 2017-10-01 VITALS — BP 166/98 | HR 71 | Temp 98.0°F | Resp 16 | Ht 61.75 in | Wt 180.0 lb

## 2017-10-01 DIAGNOSIS — Z113 Encounter for screening for infections with a predominantly sexual mode of transmission: Secondary | ICD-10-CM

## 2017-10-01 DIAGNOSIS — E782 Mixed hyperlipidemia: Secondary | ICD-10-CM

## 2017-10-01 DIAGNOSIS — Z720 Tobacco use: Secondary | ICD-10-CM

## 2017-10-01 DIAGNOSIS — R7303 Prediabetes: Secondary | ICD-10-CM | POA: Insufficient documentation

## 2017-10-01 DIAGNOSIS — F319 Bipolar disorder, unspecified: Secondary | ICD-10-CM

## 2017-10-01 DIAGNOSIS — I2510 Atherosclerotic heart disease of native coronary artery without angina pectoris: Secondary | ICD-10-CM | POA: Diagnosis not present

## 2017-10-01 DIAGNOSIS — I10 Essential (primary) hypertension: Secondary | ICD-10-CM

## 2017-10-01 DIAGNOSIS — R002 Palpitations: Secondary | ICD-10-CM | POA: Diagnosis not present

## 2017-10-01 DIAGNOSIS — J449 Chronic obstructive pulmonary disease, unspecified: Secondary | ICD-10-CM | POA: Diagnosis not present

## 2017-10-01 DIAGNOSIS — F4310 Post-traumatic stress disorder, unspecified: Secondary | ICD-10-CM

## 2017-10-01 DIAGNOSIS — F419 Anxiety disorder, unspecified: Secondary | ICD-10-CM

## 2017-10-01 MED ORDER — LOSARTAN POTASSIUM 50 MG PO TABS: 50.0000 mg | ORAL_TABLET | Freq: Every day | ORAL | 1 refills | Status: DC
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
## 2017-10-02 LAB — COMPLETE METABOLIC PANEL WITH GFR
AG Ratio: 1.7 (calc) (ref 1.0–2.5)
ALBUMIN MSPROF: 4.3 g/dL (ref 3.6–5.1)
ALKALINE PHOSPHATASE (APISO): 79 U/L (ref 33–130)
ALT: 18 U/L (ref 6–29)
AST: 17 U/L (ref 10–35)
BILIRUBIN TOTAL: 0.7 mg/dL (ref 0.2–1.2)
BUN: 13 mg/dL (ref 7–25)
CO2: 27 mmol/L (ref 20–32)
Calcium: 9.4 mg/dL (ref 8.6–10.4)
Chloride: 105 mmol/L (ref 98–110)
Creat: 0.79 mg/dL (ref 0.50–1.05)
GFR, Est African American: 100 mL/min/{1.73_m2} (ref >=60)
GFR, Est Non African American: 86 mL/min/{1.73_m2} (ref >=60)
Globulin: 2.6 g/dL (calc) (ref 1.9–3.7)
Glucose, Bld: 98 mg/dL (ref 65–139)
POTASSIUM: 4.3 mmol/L (ref 3.5–5.3)
SODIUM: 142 mmol/L (ref 135–146)
Total Protein: 6.9 g/dL (ref 6.1–8.1)

## 2017-10-02 LAB — CBC WITH DIFFERENTIAL/PLATELET
BASOS ABS: 92 {cells}/uL (ref 0–200)
Basophils Relative: 1 %
EOS ABS: 248 {cells}/uL (ref 15–500)
Eosinophils Relative: 2.7 %
HCT: 42.8 % (ref 35.0–45.0)
HEMOGLOBIN: 15 g/dL (ref 11.7–15.5)
Lymphs Abs: 4112 cells/uL — ABNORMAL HIGH (ref 850–3900)
MCH: 30.5 pg (ref 27.0–33.0)
MCHC: 35 g/dL (ref 32.0–36.0)
MCV: 87.2 fL (ref 80.0–100.0)
MONOS PCT: 5.7 %
MPV: 11 fL (ref 7.5–12.5)
NEUTROS ABS: 4223 {cells}/uL (ref 1500–7800)
Neutrophils Relative %: 45.9 %
Platelets: 339 10*3/uL (ref 140–400)
RBC: 4.91 10*6/uL (ref 3.80–5.10)
RDW: 12.7 % (ref 11.0–15.0)
Total Lymphocyte: 44.7 %
WBC mixed population: 524 cells/uL (ref 200–950)
WBC: 9.2 10*3/uL (ref 3.8–10.8)

## 2017-10-02 LAB — LIPID PANEL
Cholesterol: 187 mg/dL (ref <200)
HDL: 34 mg/dL — ABNORMAL LOW (ref >50)
LDL CHOLESTEROL (CALC): 127 mg/dL — AB
Non-HDL Cholesterol (Calc): 153 mg/dL (calc) — ABNORMAL HIGH (ref <130)
Total CHOL/HDL Ratio: 5.5 (calc) — ABNORMAL HIGH (ref <5.0)
Triglycerides: 144 mg/dL (ref <150)

## 2017-10-02 LAB — TSH: TSH: 1.46 m[IU]/L

## 2017-10-02 LAB — HEPATITIS PANEL, ACUTE
HEP A IGM: NONREACTIVE
HEP B C IGM: NONREACTIVE
HEP B S AG: NONREACTIVE
Hepatitis C Ab: NONREACTIVE
SIGNAL TO CUT-OFF: 0.02 (ref <1.00)

## 2017-10-02 LAB — HEMOGLOBIN A1C
HEMOGLOBIN A1C: 5.6 %{Hb} (ref <5.7)
Mean Plasma Glucose: 114 (calc)
eAG (mmol/L): 6.3 (calc)

## 2017-10-02 LAB — HIV ANTIBODY (ROUTINE TESTING W REFLEX): HIV 1&2 Ab, 4th Generation: NONREACTIVE

## 2017-10-02 LAB — RPR: RPR Ser Ql: NONREACTIVE

## 2017-10-03 LAB — URINE CYTOLOGY ANCILLARY ONLY
CHLAMYDIA, DNA PROBE: NEGATIVE
Neisseria Gonorrhea: NEGATIVE
TRICH (WINDOWPATH): NEGATIVE

## 2017-10-06 ENCOUNTER — Encounter: Payer: Self-pay | Admitting: Family Medicine

## 2017-10-06 ENCOUNTER — Ambulatory Visit (INDEPENDENT_AMBULATORY_CARE_PROVIDER_SITE_OTHER): Payer: 59 | Admitting: Psychiatry

## 2017-10-06 ENCOUNTER — Encounter (HOSPITAL_COMMUNITY): Payer: Self-pay | Admitting: Psychiatry

## 2017-10-06 DIAGNOSIS — F3162 Bipolar disorder, current episode mixed, moderate: Secondary | ICD-10-CM | POA: Diagnosis not present
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
## 2017-10-07 ENCOUNTER — Encounter (HOSPITAL_COMMUNITY): Payer: Self-pay | Admitting: Psychiatry

## 2017-10-07 LAB — URINE CYTOLOGY ANCILLARY ONLY
BACTERIAL VAGINITIS: NEGATIVE
CANDIDA VAGINITIS: NEGATIVE

## 2017-10-07 NOTE — Telephone Encounter (Signed)
This encounter was created in error - please disregard.
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
## 2017-10-08 ENCOUNTER — Ambulatory Visit (INDEPENDENT_AMBULATORY_CARE_PROVIDER_SITE_OTHER): Payer: Medicare Other | Admitting: Cardiology

## 2017-10-08 ENCOUNTER — Encounter: Payer: Self-pay | Admitting: Cardiology

## 2017-10-08 VITALS — BP 131/80 | HR 80 | Ht 62.0 in | Wt 179.6 lb

## 2017-10-08 DIAGNOSIS — R002 Palpitations: Secondary | ICD-10-CM | POA: Diagnosis not present

## 2017-10-08 DIAGNOSIS — I2510 Atherosclerotic heart disease of native coronary artery without angina pectoris: Secondary | ICD-10-CM

## 2017-10-08 DIAGNOSIS — I10 Essential (primary) hypertension: Secondary | ICD-10-CM | POA: Diagnosis not present

## 2017-10-08 NOTE — Patient Instructions (Signed)
Medication Instructions:  Your physician recommends that you continue on your current medications as directed. Please refer to the Current Medication list given to you today.  Labwork: none  Testing/Procedures: Your physician has recommended that you wear a holter monitor. Holter monitors are medical devices that record the heart's electrical activity. Doctors most often use these monitors to diagnose arrhythmias. Arrhythmias are problems with the speed or rhythm of the heartbeat. The monitor is a small, portable device. You can wear one while you do your normal daily activities. This is usually used to diagnose what is causing palpitations/syncope (passing out).    Follow-Up: Your physician recommends that you schedule a follow-up appointment in: pending test    Any Other Special Instructions Will Be Listed Below (If Applicable). We will request records from [facility]     If you need a refill on your cardiac medications before your next appointment, please call your pharmacy.

## 2017-10-08 NOTE — Progress Notes (Signed)
Clinical Summary [name] is a 52 y.o.female seen as new consult for palpitations, referred by Dr Mannie Stabile  1. CAD history - self reported history of MI in 2014 at [facility]. Had cath, per report she reports there was no significant blockages and was managed medically - no recent chest pain symptoms.   2. HTN - at last visit pcp visit restarted lopressor, started losartan 50mg  daily - compliant with meds  3. Palpitations - feeling of heart fluttering. Can be painful. Occurs daily, lasts just a few seconds - can occur at rest or with exertion. Can feel lightheaded, +SOB - coffee x 2-3, 4-5 mountain dew cans, rare, no EtOH - ongoing for a few years, increase in frequency.  - TSH 1.46,  Past Medical History:  Diagnosis Date  . Anxiety   . Arthritis    "pretty much all over; mainly neck and back" (12/26/2015)  . Asthma   . Bipolar 1 disorder ([place])   . Bipolar disorder ([place])   . Cervical cancer ([place])    "stage I"  . Chronic back pain   . Chronic bronchitis ([place])   . COPD (chronic obstructive pulmonary disease) ([place])   . Degenerative disc disease   . Depression   . Dysrhythmia   . Fibromyalgia   . GERD (gastroesophageal reflux disease)   . Hepatic cyst   . High cholesterol   . History of hiatal hernia   . Hypertension 08/04/2012  . IBS (irritable bowel syndrome) Diagnosed November 2012  . Incisional hernia with gangrene and obstruction 12/26/2015  . Kidney stones    "passed them"  . Melanoma of back ([place])   . Ovarian cancer ([place])    "stage II"  . Plantar fasciitis, bilateral   . Polycystic kidney disease    ruled out  . Sleep apnea    cpap coming  "soon" (12/26/2015)     Allergies  Allergen Reactions  . Barium-Containing Compounds Other (See Comments)    Stomach cramps, extreme diarrhea, and vomiting  . Bee Venom Anaphylaxis  . Flu Virus Vaccine Swelling    Trouble swallowing Trouble swallowing   . Seldane [Terfenadine] Nausea And Vomiting and  Other (See Comments)    CAUSES TOP LAYER OF SKIN TO PEEL  . Sulfa Antibiotics Anaphylaxis  . Lisinopril Cough  . Lithium Other (See Comments)    Agitation and hostility  . Tetracyclines & Related Hives and Nausea And Vomiting  . Zinc Other (See Comments)  . Aspirin Rash and Other (See Comments)    Upset stomach  . Ativan [Lorazepam] Other (See Comments)    Irritability  . Erythromycin Nausea And Vomiting, Rash and Other (See Comments)    Cramps  . Lipitor [Atorvastatin] Nausea And Vomiting     Current Outpatient Medications  Medication Sig Dispense Refill  . albuterol (PROVENTIL HFA;VENTOLIN HFA) 108 (90 BASE) MCG/ACT inhaler Inhale 1-2 puffs into the lungs every 6 (six) hours as needed for wheezing or shortness of breath.    . cyclobenzaprine (FLEXERIL) 5 MG tablet Take by mouth.    . DULoxetine (CYMBALTA) 30 MG capsule Take 30 mg by mouth at bedtime.    . fluticasone furoate-vilanterol (BREO ELLIPTA) 100-25 MCG/INH AEPB Inhale 1 puff into the lungs daily.    Marland Kitchen losartan (COZAAR) 50 MG tablet Take 50 mg by mouth daily.    Marland Kitchen losartan (COZAAR) 50 MG tablet Take 1 tablet (50 mg total) by mouth daily. 30 tablet 1  . metoprolol tartrate (LOPRESSOR)  25 MG tablet Take by mouth.    . prednisoLONE acetate (PRED FORTE) 1 % ophthalmic suspension 1 drop 4 (four) times daily.     No current facility-administered medications for this visit.      Past Surgical History:  Procedure Laterality Date  . ABDOMINAL HYSTERECTOMY  1997  . APPENDECTOMY    . CARDIAC CATHETERIZATION  2015  . CARPAL TUNNEL RELEASE Right   . CESAREAN SECTION  1987  . CHOLECYSTECTOMY OPEN  1998  . COLONOSCOPY  November 2012  . EYE SURGERY     CATARACTS REMOVED 09/09/17 and 09/16/17  . HERNIA REPAIR    . INCISIONAL HERNIA REPAIR N/A 12/26/2015   Procedure: LAPAROSCOPIC REPAIR INCISIONAL HERNIA WITH MESH;  Surgeon: Fanny Skates, MD;  Location: [facility];  Service: General;  Laterality: N/A;  . INSERTION OF MESH N/A  12/26/2015   Procedure: INSERTION OF MESH;  Surgeon: Fanny Skates, MD;  Location: [facility];  Service: General;  Laterality: N/A;  . [place]  . LAPAROSCOPIC INCISIONAL / UMBILICAL / VENTRAL HERNIA REPAIR  12/26/2015   repair of incarcerated incisional hernia with mesh  . LIVER CYST REMOVAL  2000s  . MELANOMA EXCISION  January 2013   removed from back  . SHOULDER SURGERY Left 2010   Humeral Head Microfracture   . TUBAL LIGATION    . UPPER GASTROINTESTINAL ENDOSCOPY  November 2012     Allergies  Allergen Reactions  . Barium-Containing Compounds Other (See Comments)    Stomach cramps, extreme diarrhea, and vomiting  . Bee Venom Anaphylaxis  . Flu Virus Vaccine Swelling    Trouble swallowing Trouble swallowing   . Seldane [Terfenadine] Nausea And Vomiting and Other (See Comments)    CAUSES TOP LAYER OF SKIN TO PEEL  . Sulfa Antibiotics Anaphylaxis  . Lisinopril Cough  . Lithium Other (See Comments)    Agitation and hostility  . Tetracyclines & Related Hives and Nausea And Vomiting  . Zinc Other (See Comments)  . Aspirin Rash and Other (See Comments)    Upset stomach  . Ativan [Lorazepam] Other (See Comments)    Irritability  . Erythromycin Nausea And Vomiting, Rash and Other (See Comments)    Cramps  . Lipitor [Atorvastatin] Nausea And Vomiting      Family History  Adopted: Yes  Problem Relation Age of Onset  . Bipolar disorder Mother        never diagnosed but patient suspects mother had bipolar disorder.Marland KitchenMarland KitchenMarland KitchenMarland Kitchenpt was adopeted, only knew birth mother for a short period of time.  Marland Kitchen Anxiety disorder Mother   . Cirrhosis Mother   . Diabetes Mother   . Turner syndrome Daughter   . Cancer Daughter   . Bipolar disorder Daughter   . Interstitial cystitis Daughter   . ADD / ADHD Neg Hx   . Alcohol abuse Neg Hx   . Drug abuse Neg Hx   . Dementia Neg Hx   . Depression Neg Hx   . OCD Neg Hx   . Paranoid behavior Neg Hx   . Schizophrenia Neg Hx    . Seizures Neg Hx   . Sexual abuse Neg Hx   . Physical abuse Neg Hx   . Colon cancer Neg Hx      Social History [name] reports that she has been smoking cigarettes.  She has a 23.00 pack-year smoking history. she has never used smokeless tobacco. [name] reports that she does not drink alcohol.   Review of Systems  CONSTITUTIONAL: No weight loss, fever, chills, weakness or fatigue.  HEENT: Eyes: No visual loss, blurred vision, double vision or yellow sclerae.No hearing loss, sneezing, congestion, runny nose or sore throat.  SKIN: No rash or itching.  CARDIOVASCULAR: per hpi RESPIRATORY: No shortness of breath, cough or sputum.  GASTROINTESTINAL: No anorexia, nausea, vomiting or diarrhea. No abdominal pain or blood.  GENITOURINARY: No burning on urination, no polyuria NEUROLOGICAL: No headache, dizziness, syncope, paralysis, ataxia, numbness or tingling in the extremities. No change in bowel or bladder control.  MUSCULOSKELETAL: No muscle, back pain, joint pain or stiffness.  LYMPHATICS: No enlarged nodes. No history of splenectomy.  PSYCHIATRIC: No history of depression or anxiety.  ENDOCRINOLOGIC: No reports of sweating, cold or heat intolerance. No polyuria or polydipsia.  Marland Kitchen   Physical Examination Vitals:   10/08/17 1324  BP: 131/80  Pulse: 80  SpO2: 96%   Vitals:   10/08/17 1324  Weight: 179 lb 9.6 oz (81.5 kg)  Height: 5\' 2"  (1.575 m)    Gen: resting comfortably, no acute distress HEENT: no scleral icterus, pupils equal round and reactive, no palptable cervical adenopathy,  CV: RRR, no m/r/g no jvd Resp: Clear to auscultation bilaterally GI: abdomen is soft, non-tender, non-distended, normal bowel sounds, no hepatosplenomegaly MSK: extremities are warm, no edema.  Skin: warm, no rash Neuro:  no focal deficits Psych: appropriate affect     Assessment and Plan  1. CAD - self reported history, at this time details are unclear. We will request prior  records - she does have chronic LBBB that is noted.  2. HTN At goal, continue current meds  3. Palpitations - we will obtain 48hr holter monitor - EKG in clinic today shows SR, LBBB   F/u pending monitor results      Arnoldo Lenis, M.D.

## 2017-10-12 ENCOUNTER — Encounter: Payer: Self-pay | Admitting: Cardiology

## 2017-10-14 ENCOUNTER — Ambulatory Visit (HOSPITAL_COMMUNITY)
Admission: RE | Admit: 2017-10-14 | Discharge: 2017-10-14 | Disposition: A | Discharge status: Home / Self Care | Payer: Medicare Other | Source: Ambulatory Visit | Attending: Cardiology | Admitting: Cardiology

## 2017-10-14 DIAGNOSIS — R002 Palpitations: Secondary | ICD-10-CM | POA: Diagnosis present

## 2017-10-21 ENCOUNTER — Telehealth: Payer: Self-pay | Admitting: Cardiology

## 2017-10-21 NOTE — Telephone Encounter (Signed)
Sent message to doctor to result.

## 2017-10-21 NOTE — Telephone Encounter (Signed)
would like to know results from monitor

## 2017-10-22 ENCOUNTER — Telehealth: Payer: Self-pay

## 2017-10-22 DIAGNOSIS — F419 Anxiety disorder, unspecified: Secondary | ICD-10-CM

## 2017-10-22 NOTE — BH Specialist Note (Signed)
[facility] Telephone Follow-up  MRN: [id number] NAME: RAMONIA MCCLARAN Date: 10/22/17  Start time: 11:30am - 12:00pm Total time: 30 minutes Call number: 2/6  Reason for call today: Reason for Contact: PHQ9-2 weeks( )  PHQ-9 Scores:  Depression screen [facility] 2/9 10/22/2017 Oct 14, 2017 2017/10/14 03/06/2015  Decreased Interest 2 2 3 1   Down, Depressed, Hopeless 1 1 3 3   PHQ - 2 Score 3 3 6 4   Altered sleeping 3 3 3 2   Tired, decreased energy 1 2 3 3   Change in appetite 2 2 3 3   Feeling bad or failure about yourself  1 3 3 3   Trouble concentrating 0 1 3 3   Moving slowly or fidgety/restless 0 0 3 3  Suicidal thoughts 0 1 0 3  PHQ-9 Score 10 15 24 24   Difficult doing work/chores - Somewhat difficult Extremely dIfficult Extremely dIfficult   GAD-7 Scores:  GAD 7 : Generalized Anxiety Score 10/22/2017 10/14/17  Nervous, Anxious, on Edge 2 2  Control/stop worrying 1 2  Worry too much - different things 2 1  Trouble relaxing 1 2  Restless 0 2  Easily annoyed or irritable 0 0  Afraid - awful might happen 2 1  Total GAD 7 Score 8 10  Anxiety Difficulty Somewhat difficult -    Stress Current stressors: Current Stressors: Other (Comment)(Wearing a heart monitor.  Waiting on test results) Sleep: Sleep: Difficulty falling asleep, Difficulty staying asleep Appetite: Appetite: Decreased, Loss of appetite Coping ability: Coping ability: Resilient Patient taking medications as prescribed: Patient taking medications as prescribed: Yes  Current medications:  Outpatient Encounter Medications as of 10/22/2017  Medication Sig  . albuterol (PROVENTIL HFA;VENTOLIN HFA) 108 (90 BASE) MCG/ACT inhaler Inhale 1-2 puffs into the lungs every 6 (six) hours as needed for wheezing or shortness of breath.  . cyclobenzaprine (FLEXERIL) 5 MG tablet Take by mouth.  . fluticasone furoate-vilanterol (BREO ELLIPTA) 100-25 MCG/INH AEPB Inhale 1 puff into the lungs daily.  Marland Kitchen losartan (COZAAR) 50 MG tablet Take  1 tablet (50 mg total) by mouth daily.  . metoprolol tartrate (LOPRESSOR) 25 MG tablet Take 25 mg by mouth 2 (two) times daily.   . prednisoLONE acetate (PRED FORTE) 1 % ophthalmic suspension 1 drop 4 (four) times daily.   No facility-administered encounter medications on file as of 10/22/2017.      Self-harm Behaviors Risk Assessment Self-harm risk factors:   Patient endorses recent thoughts of harming self: Have you recently had any thoughts about harming yourself?: No  [place] Suicide Severity Rating Scale: No flowsheet data found. C-SRSS 10/14/2017  1. Wish to be Dead No  2. Suicidal Thoughts Yes  3. Suicidal Thoughts with Method Without Specific Plan or Intent to Act No  4. Suicidal Intent Without Specific Plan No  5. Suicide Intent with Specific Plan No  6. Suicide Behavior Question No  How long ago did you do any of these? Within the last three months     Danger to Others Risk Assessment Danger to others risk factors: Danger to Others Risk Factors: No risk factors noted Patient endorses recent thoughts of harming others: Notification required: No need or identified person  Dynamic Appraisal of Situational Aggression (DASA): No flowsheet data found.    Goals, Interventions and Follow-up Plan Goals: Increase healthy adjustment to current life circumstances Interventions: Motivational Interviewing, Supportive Counseling and Sleep Hygiene Follow-up Plan: [facility] Phone Follow up   Summary:  Patient reports that she is still having difficulty sleeping at night.  Patient reports a decrease in depression but an increase in anxiety associated with wearing a heart monitor and not receive her test results.   Patient reports that crochet ingis the best coping mechanism for her. She is very proud of her face book crochet    Johnnye Sima, [place], LCAS-A

## 2017-10-23 ENCOUNTER — Telehealth: Payer: Self-pay

## 2017-10-23 MED ORDER — METOPROLOL TARTRATE 25 MG PO TABS: 37.5000 mg | ORAL_TABLET | Freq: Two times a day (BID) | ORAL | 3 refills | Status: DC

## 2017-10-23 NOTE — Telephone Encounter (Signed)
E-scribed increased dose of lopressor to [employer], will have front desk staff make f/u apt

## 2017-10-23 NOTE — Telephone Encounter (Signed)
-----   Message from Arnoldo Lenis, MD sent at 10/22/2017  2:46 PM EDT ----- Heart monitor shows just some occsaional extra heart beats, no significant abnormal rhythms. If ongoing symptoms of palpitations I would increase her lopressor to 37.5mg  bid. Continuing to cut back on caffeine will also help.  F/u 3 months, contact us if symptoms continue and we can titrate medication further   J BrancH MD

## 2017-10-29 ENCOUNTER — Telehealth: Payer: Self-pay

## 2017-10-29 DIAGNOSIS — F419 Anxiety disorder, unspecified: Secondary | ICD-10-CM

## 2017-10-29 DIAGNOSIS — F319 Bipolar disorder, unspecified: Secondary | ICD-10-CM

## 2017-10-29 DIAGNOSIS — F4310 Post-traumatic stress disorder, unspecified: Secondary | ICD-10-CM

## 2017-10-29 NOTE — BH Specialist Note (Signed)
[facility] Treatment Plan Team Note  MRN: [id number] NAME: [name]  DATE: 10/29/17    Total time: 15 minutes Total number of Virtual [place] Treatment Team Plan encounters: 1/4  Treatment Team Attendees: Graciella Freer and Dr. Modesta Messing   Screenings PHQ-9 Assessments:  Depression screen [facility] 2/9 10/22/2017 10/01/2017 10/01/2017  Decreased Interest 2 2 3   Down, Depressed, Hopeless 1 1 3   PHQ - 2 Score 3 3 6   Altered sleeping 3 3 3   Tired, decreased energy 1 2 3   Change in appetite 2 2 3   Feeling bad or failure about yourself  1 3 3   Trouble concentrating 0 1 3  Moving slowly or fidgety/restless 0 0 3  Suicidal thoughts 0 1 0  PHQ-9 Score 10 15 24   Difficult doing work/chores - Somewhat difficult Extremely dIfficult   GAD-7 Assessments:  GAD 7 : Generalized Anxiety Score 10/22/2017 10/01/2017  Nervous, Anxious, on Edge 2 2  Control/stop worrying 1 2  Worry too much - different things 2 1  Trouble relaxing 1 2  Restless 0 2  Easily annoyed or irritable 0 0  Afraid - awful might happen 2 1  Total GAD 7 Score 8 10  Anxiety Difficulty Somewhat difficult -    Presenting Problem/Current Symptoms: Depression and Anxiety   Diagnoses:    ICD-10-CM   1. Anxiety F41.9   2. Bipolar 1 disorder (HCC) F31.9   3. PTSD (post-traumatic stress disorder) F43.10     Psychiatric History  Past Psychiatric History/Hospitalization(s): Anxiety: Yes Bipolar Disorder: Yes Depression: Yes Mania: No Psychosis: No Schizophrenia: No Personality Disorder: No Hospitalization for psychiatric illness: Yes History of Electroconvulsive Shock Therapy: No Prior Suicide Attempts: Yes   Stress   [facility] [place] Phone Follow Up from 10/22/2017 in [facility]  Current Stressors  Other (Comment) [Wearing a heart monitor.  Waiting on test results]  Familial Stressors  None  Sleep  Difficulty falling asleep, Difficulty staying asleep  Appetite  Decreased, Loss of appetite  Coping  ability  Resilient  Patient taking medications as prescribed  Yes        Self-harm Behaviors Risk Assessment   [facility] [place] Phone Follow Up from 10/22/2017 in [facility]  Have you recently had any thoughts about harming yourself?  No       Allergies:  Allergies as of 10/29/2017 - Review Complete 10/12/2017  Allergen Reaction Noted  . Barium-containing compounds Other (See Comments) 08/11/2012  . Bee venom Anaphylaxis 09/30/2011  . Flu virus vaccine Swelling 09/25/2015  . Seldane [terfenadine] Nausea And Vomiting and Other (See Comments) 05/26/2011  . Sulfa antibiotics Anaphylaxis 05/26/2011  . Lisinopril Cough 10/22/2015  . Lithium Other (See Comments) 06/30/2012  . Tetracyclines & related Hives and Nausea And Vomiting 05/26/2011  . Zinc Other (See Comments) 09/25/2015  . Aspirin Rash and Other (See Comments) 05/26/2011  . Ativan [lorazepam] Other (See Comments) 12/18/2015  . Erythromycin Nausea And Vomiting, Rash, and Other (See Comments) 05/26/2011  . Lipitor [atorvastatin] Nausea And Vomiting 12/18/2015   Medication History Current medications:  Outpatient Encounter Medications as of 10/29/2017  Medication Sig  . albuterol (PROVENTIL HFA;VENTOLIN HFA) 108 (90 BASE) MCG/ACT inhaler Inhale 1-2 puffs into the lungs every 6 (six) hours as needed for wheezing or shortness of breath.  . cyclobenzaprine (FLEXERIL) 5 MG tablet Take by mouth.  . fluticasone furoate-vilanterol (BREO ELLIPTA) 100-25 MCG/INH AEPB Inhale 1 puff into the lungs daily.  Marland Kitchen losartan (COZAAR) 50 MG tablet Take 1  tablet (50 mg total) by mouth daily.  . metoprolol tartrate (LOPRESSOR) 25 MG tablet Take 1.5 tablets (37.5 mg total) by mouth 2 (two) times daily.  . prednisoLONE acetate (PRED FORTE) 1 % ophthalmic suspension 1 drop 4 (four) times daily.   No facility-administered encounter medications on file as of 10/29/2017.     Psychotropic Medication Management:   1. Medication: See Addendum    Indication:   Date started:   Date(s) changed:   Taking as prescribed:   Positive effects/relief of symptoms:   Negative side effects:   Medication Management Recommendations: See Addendum   Goals, Interventions and Follow-up Plan Goals: Increase healthy adjustment to current life circumstances Interventions: Motivational Interviewing Supportive Counseling Sleep Hygiene Follow-up Plan: [facility] Phone Follow up   Scribe for Treatment Team: Rene Paci, LCAS-A

## 2017-10-30 ENCOUNTER — Other Ambulatory Visit: Payer: Self-pay | Admitting: Family Medicine

## 2017-10-30 DIAGNOSIS — I10 Essential (primary) hypertension: Secondary | ICD-10-CM

## 2017-11-04 ENCOUNTER — Ambulatory Visit (INDEPENDENT_AMBULATORY_CARE_PROVIDER_SITE_OTHER): Payer: Medicare Other | Admitting: Family Medicine

## 2017-11-04 ENCOUNTER — Ambulatory Visit: Payer: Self-pay | Admitting: Family Medicine

## 2017-11-04 ENCOUNTER — Other Ambulatory Visit: Payer: Self-pay

## 2017-11-04 ENCOUNTER — Encounter: Payer: Self-pay | Admitting: Family Medicine

## 2017-11-04 VITALS — BP 170/82 | HR 93 | Temp 97.0°F | Resp 16 | Ht 62.0 in | Wt 181.8 lb

## 2017-11-04 DIAGNOSIS — F3130 Bipolar disorder, current episode depressed, mild or moderate severity, unspecified: Secondary | ICD-10-CM

## 2017-11-04 DIAGNOSIS — E782 Mixed hyperlipidemia: Secondary | ICD-10-CM | POA: Diagnosis not present

## 2017-11-04 DIAGNOSIS — J449 Chronic obstructive pulmonary disease, unspecified: Secondary | ICD-10-CM

## 2017-11-04 DIAGNOSIS — G5603 Carpal tunnel syndrome, bilateral upper limbs: Secondary | ICD-10-CM

## 2017-11-04 DIAGNOSIS — Z720 Tobacco use: Secondary | ICD-10-CM | POA: Diagnosis not present

## 2017-11-04 DIAGNOSIS — R7303 Prediabetes: Secondary | ICD-10-CM | POA: Diagnosis not present

## 2017-11-04 DIAGNOSIS — I10 Essential (primary) hypertension: Secondary | ICD-10-CM | POA: Diagnosis not present

## 2017-11-04 MED ORDER — ARIPIPRAZOLE 2 MG PO TABS: 2.0000 mg | ORAL_TABLET | Freq: Every day | ORAL | 1 refills | Status: DC

## 2017-11-04 MED ORDER — DULOXETINE HCL 30 MG PO CPEP: 30.0000 mg | ORAL_CAPSULE | Freq: Every day | ORAL | 1 refills | Status: DC

## 2017-11-04 MED ORDER — METOPROLOL TARTRATE 25 MG PO TABS: 50.0000 mg | ORAL_TABLET | Freq: Two times a day (BID) | ORAL | 3 refills | Status: DC

## 2017-11-04 MED ORDER — ROSUVASTATIN CALCIUM 20 MG PO TABS: 20.0000 mg | ORAL_TABLET | Freq: Every day | ORAL | 3 refills | Status: DC

## 2017-11-04 MED ORDER — LOSARTAN POTASSIUM 100 MG PO TABS: 100.0000 mg | ORAL_TABLET | Freq: Every day | ORAL | 3 refills | Status: DC

## 2017-11-04 MED ORDER — FLUTICASONE FUROATE-VILANTEROL 100-25 MCG/INH IN AEPB: 1.0000 | INHALATION_SPRAY | Freq: Every day | RESPIRATORY_TRACT | 11 refills | Status: DC

## 2017-11-04 NOTE — Patient Instructions (Addendum)
Increase the losartan to 100 mg a day Increase the metoprolol tartrate the 25 mg, 2 tablets twice a day  Follow up in 1 month Go see the orthopedic about your carpal tunnel. Keep your appointment with Dr. Modesta Messing  Start the cymbalta and abilify.   Continue to cut down on cigarettes.   Work on loosing weight.  DASH Eating Plan DASH stands for "Dietary Approaches to Stop Hypertension." The DASH eating plan is a healthy eating plan that has been shown to reduce high blood pressure (hypertension). It may also reduce your risk for type 2 diabetes, heart disease, and stroke. The DASH eating plan may also help with weight loss. What are tips for following this plan? General guidelines  Avoid eating more than 2,300 mg (milligrams) of salt (sodium) a day. If you have hypertension, you may need to reduce your sodium intake to 1,500 mg a day.  Limit alcohol intake to no more than 1 drink a day for nonpregnant women and 2 drinks a day for men. One drink equals 12 oz of beer, 5 oz of wine, or 1 oz of hard liquor.  Work with your health care provider to maintain a healthy body weight or to lose weight. Ask what an ideal weight is for you.  Get at least 30 minutes of exercise that causes your heart to beat faster (aerobic exercise) most days of the week. Activities may include walking, swimming, or biking.  Work with your health care provider or diet and nutrition specialist (dietitian) to adjust your eating plan to your individual calorie needs. Reading food labels  Check food labels for the amount of sodium per serving. Choose foods with less than 5 percent of the Daily Value of sodium. Generally, foods with less than 300 mg of sodium per serving fit into this eating plan.  To find whole grains, look for the word "whole" as the first word in the ingredient list. Shopping  Buy products labeled as "low-sodium" or "no salt added."  Buy fresh foods. Avoid canned foods and premade or frozen  meals. Cooking  Avoid adding salt when cooking. Use salt-free seasonings or herbs instead of table salt or sea salt. Check with your health care provider or pharmacist before using salt substitutes.  Do not fry foods. Cook foods using healthy methods such as baking, boiling, grilling, and broiling instead.  Cook with heart-healthy oils, such as olive, canola, soybean, or sunflower oil. Meal planning   Eat a balanced diet that includes: ? 5 or more servings of fruits and vegetables each day. At each meal, try to fill half of your plate with fruits and vegetables. ? Up to 6-8 servings of whole grains each day. ? Less than 6 oz of lean meat, poultry, or fish each day. A 3-oz serving of meat is about the same size as a deck of cards. One egg equals 1 oz. ? 2 servings of low-fat dairy each day. ? A serving of nuts, seeds, or beans 5 times each week. ? Heart-healthy fats. Healthy fats called Omega-3 fatty acids are found in foods such as flaxseeds and coldwater fish, like sardines, salmon, and mackerel.  Limit how much you eat of the following: ? Canned or prepackaged foods. ? Food that is high in trans fat, such as fried foods. ? Food that is high in saturated fat, such as fatty meat. ? Sweets, desserts, sugary drinks, and other foods with added sugar. ? Full-fat dairy products.  Do not salt foods before eating.  Try to eat at least 2 vegetarian meals each week.  Eat more home-cooked food and less restaurant, buffet, and fast food.  When eating at a restaurant, ask that your food be prepared with less salt or no salt, if possible. What foods are recommended? The items listed may not be a complete list. Talk with your dietitian about what dietary choices are best for you. Grains Whole-grain or whole-wheat bread. Whole-grain or whole-wheat pasta. Brown rice. Modena Morrow. Bulgur. Whole-grain and low-sodium cereals. Pita bread. Low-fat, low-sodium crackers. Whole-wheat flour  tortillas. Vegetables Fresh or frozen vegetables (raw, steamed, roasted, or grilled). Low-sodium or reduced-sodium tomato and vegetable juice. Low-sodium or reduced-sodium tomato sauce and tomato paste. Low-sodium or reduced-sodium canned vegetables. Fruits All fresh, dried, or frozen fruit. Canned fruit in natural juice (without added sugar). Meat and other protein foods Skinless chicken or [place]. Ground chicken or [place]. Pork with fat trimmed off. Fish and seafood. Egg whites. Dried beans, peas, or lentils. Unsalted nuts, nut butters, and seeds. Unsalted canned beans. Lean cuts of beef with fat trimmed off. Low-sodium, lean deli meat. Dairy Low-fat (1%) or fat-free (skim) milk. Fat-free, low-fat, or reduced-fat cheeses. Nonfat, low-sodium ricotta or cottage cheese. Low-fat or nonfat yogurt. Low-fat, low-sodium cheese. Fats and oils Soft margarine without trans fats. Vegetable oil. Low-fat, reduced-fat, or light mayonnaise and salad dressings (reduced-sodium). Canola, safflower, olive, soybean, and sunflower oils. Avocado. Seasoning and other foods Herbs. Spices. Seasoning mixes without salt. Unsalted popcorn and pretzels. Fat-free sweets. What foods are not recommended? The items listed may not be a complete list. Talk with your dietitian about what dietary choices are best for you. Grains Baked goods made with fat, such as croissants, muffins, or some breads. Dry pasta or rice meal packs. Vegetables Creamed or fried vegetables. Vegetables in a cheese sauce. Regular canned vegetables (not low-sodium or reduced-sodium). Regular canned tomato sauce and paste (not low-sodium or reduced-sodium). Regular tomato and vegetable juice (not low-sodium or reduced-sodium). Angie Fava. Olives. Fruits Canned fruit in a light or heavy syrup. Fried fruit. Fruit in cream or butter sauce. Meat and other protein foods Fatty cuts of meat. Ribs. Fried meat. Berniece Salines. Sausage. Bologna and other processed lunch meats.  Salami. Fatback. Hotdogs. Bratwurst. Salted nuts and seeds. Canned beans with added salt. Canned or smoked fish. Whole eggs or egg yolks. Chicken or [place] with skin. Dairy Whole or 2% milk, cream, and half-and-half. Whole or full-fat cream cheese. Whole-fat or sweetened yogurt. Full-fat cheese. Nondairy creamers. Whipped toppings. Processed cheese and cheese spreads. Fats and oils Butter. Stick margarine. Lard. Shortening. Ghee. Bacon fat. Tropical oils, such as coconut, palm kernel, or palm oil. Seasoning and other foods Salted popcorn and pretzels. Onion salt, garlic salt, seasoned salt, table salt, and sea salt. Worcestershire sauce. Tartar sauce. Barbecue sauce. Teriyaki sauce. Soy sauce, including reduced-sodium. Steak sauce. Canned and packaged gravies. Fish sauce. Oyster sauce. Cocktail sauce. Horseradish that you find on the shelf. Ketchup. Mustard. Meat flavorings and tenderizers. Bouillon cubes. Hot sauce and Tabasco sauce. Premade or packaged marinades. Premade or packaged taco seasonings. Relishes. Regular salad dressings. Where to find more information:  National Heart, Lung, and [place]: https://wilson-eaton.com/  American Heart Association: www.heart.org Summary  The DASH eating plan is a healthy eating plan that has been shown to reduce high blood pressure (hypertension). It may also reduce your risk for type 2 diabetes, heart disease, and stroke.  With the DASH eating plan, you should limit salt (sodium) intake to 2,300 mg a day. If  you have hypertension, you may need to reduce your sodium intake to 1,500 mg a day.  When on the DASH eating plan, aim to eat more fresh fruits and vegetables, whole grains, lean proteins, low-fat dairy, and heart-healthy fats.  Work with your health care provider or diet and nutrition specialist (dietitian) to adjust your eating plan to your individual calorie needs. This information is not intended to replace advice given to you by your health  care provider. Make sure you discuss any questions you have with your health care provider. Document Released: 07/03/2011 Document Revised: 07/07/2016 Document Reviewed: 07/07/2016 Elsevier Interactive Patient Education  [employer].
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
## 2017-11-10 ENCOUNTER — Ambulatory Visit (INDEPENDENT_AMBULATORY_CARE_PROVIDER_SITE_OTHER): Payer: Medicare Other | Admitting: Psychiatry

## 2017-11-10 ENCOUNTER — Encounter (HOSPITAL_COMMUNITY): Payer: Self-pay | Admitting: Psychiatry

## 2017-11-10 DIAGNOSIS — F319 Bipolar disorder, unspecified: Secondary | ICD-10-CM

## 2017-11-10 DIAGNOSIS — F411 Generalized anxiety disorder: Secondary | ICD-10-CM

## 2017-11-10 DIAGNOSIS — F3162 Bipolar disorder, current episode mixed, moderate: Secondary | ICD-10-CM

## 2017-11-10 DIAGNOSIS — F4310 Post-traumatic stress disorder, unspecified: Secondary | ICD-10-CM

## 2017-11-10 DIAGNOSIS — Z62810 Personal history of physical and sexual abuse in childhood: Secondary | ICD-10-CM | POA: Diagnosis not present

## 2017-11-10 DIAGNOSIS — Z91410 Personal history of adult physical and sexual abuse: Secondary | ICD-10-CM

## 2017-11-10 DIAGNOSIS — Z91411 Personal history of adult psychological abuse: Secondary | ICD-10-CM
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
## 2017-11-17 ENCOUNTER — Ambulatory Visit (HOSPITAL_COMMUNITY): Payer: Self-pay | Admitting: Psychiatry

## 2017-11-18 ENCOUNTER — Telehealth: Payer: Self-pay

## 2017-11-18 DIAGNOSIS — F3130 Bipolar disorder, current episode depressed, mild or moderate severity, unspecified: Secondary | ICD-10-CM

## 2017-11-18 DIAGNOSIS — F419 Anxiety disorder, unspecified: Secondary | ICD-10-CM

## 2017-11-18 DIAGNOSIS — F4310 Post-traumatic stress disorder, unspecified: Secondary | ICD-10-CM

## 2017-11-18 NOTE — BH Specialist Note (Signed)
[place] Telephone Follow-up  MRN: [id number] NAME: [name] Date: 11/18/17   Total time: 30 minutes Call number: 4/6  Reason for call today: Reason for Contact: PHQ9-4 weeks  PHQ-9 Scores:  Depression screen [facility] 2/9 11/18/2017 11/10/2017 10/22/2017 10/01/2017 10/01/2017  Decreased Interest 0 1 2 2 3   Down, Depressed, Hopeless 1 2 1 1 3   PHQ - 2 Score 1 3 3 3 6   Altered sleeping 0 3 3 3 3   Tired, decreased energy 1 3 1 2 3   Change in appetite 1 3 2 2 3   Feeling bad or failure about yourself  1 1 1 3 3   Trouble concentrating 0 2 0 1 3  Moving slowly or fidgety/restless 0 2 0 0 3  Suicidal thoughts 0 0 0 1 0  PHQ-9 Score 4 17 10 15 24   Difficult doing work/chores Not difficult at all - - Somewhat difficult Extremely dIfficult      GAD-7 Scores:  GAD 7 : Generalized Anxiety Score 11/18/2017 11/10/2017 10/22/2017 10/01/2017  Nervous, Anxious, on Edge 1 1 2 2   Control/stop worrying 0 2 1 2   Worry too much - different things 1 2 2 1   Trouble relaxing 1 2 1 2   Restless 0 2 0 2  Easily annoyed or irritable 0 1 0 0  Afraid - awful might happen 1 1 2 1   Total GAD 7 Score 4 11 8 10   Anxiety Difficulty Not difficult at all Very difficult Somewhat difficult -     Stress Current stressors: Current Stressors: (None Reported) Sleep: Sleep: Decreased, Difficulty staying asleep(Pain in her back caused her to have sleep problems.) Appetite: Appetite: No problems Coping ability: Coping ability: Normal Patient taking medications as prescribed: Patient taking medications as prescribed: Yes    Current medications:  Outpatient Encounter Medications as of 11/18/2017  Medication Sig  . albuterol (PROVENTIL HFA;VENTOLIN HFA) 108 (90 BASE) MCG/ACT inhaler Inhale 1-2 puffs into the lungs every 6 (six) hours as needed for wheezing or shortness of breath.  . ARIPiprazole (ABILIFY) 2 MG tablet Take 1 tablet (2 mg total) by mouth daily.  . cyclobenzaprine (FLEXERIL) 5 MG tablet Take by  mouth.  . DULoxetine (CYMBALTA) 30 MG capsule Take 1 capsule (30 mg total) by mouth daily.  . fluticasone furoate-vilanterol (BREO ELLIPTA) 100-25 MCG/INH AEPB Inhale 1 puff into the lungs daily.  Marland Kitchen losartan (COZAAR) 100 MG tablet Take 1 tablet (100 mg total) by mouth daily.  . metoprolol tartrate (LOPRESSOR) 25 MG tablet Take 2 tablets (50 mg total) by mouth 2 (two) times daily.  . prednisoLONE acetate (PRED FORTE) 1 % ophthalmic suspension 1 drop 4 (four) times daily.  . rosuvastatin (CRESTOR) 20 MG tablet Take 1 tablet (20 mg total) by mouth daily.   No facility-administered encounter medications on file as of 11/18/2017.      Self-harm Behaviors Risk Assessment Self-harm risk factors: Self-harm risk factors: (None Reported) Patient endorses recent thoughts of harming self: Have you recently had any thoughts about harming yourself?: No   .  Danger to Others Risk Assessment Danger to others risk factors: Danger to Others Risk Factors: No risk factors noted Patient endorses recent thoughts of harming others: Notification required: No need or identified person     Goals, Interventions and Follow-up Plan Goals: Increase healthy adjustment to current life circumstances Interventions: Motivational Interviewing and Supportive Counseling Follow-up Plan: [facility] Phone Follow Up   Summary:   Patient reports a huge decrease in depression and  anxiety.  She is compliamt with taking her psych medication.  Patient reports that has completed her first appointment on (11/10/17) with the therapist [name] and her appt with [name] is scheduled on 11/20/2017.  Her relationship with her live in boyfriend is getting even better.  Patient continues to have her Facebook.  Completing croched projects and taking care of her birds calms her down      Johnnye Sima, Aury Scollard [place], LCAS-A
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
## 2017-11-20 ENCOUNTER — Ambulatory Visit (INDEPENDENT_AMBULATORY_CARE_PROVIDER_SITE_OTHER): Payer: Medicare Other | Admitting: Psychiatry

## 2017-11-20 ENCOUNTER — Encounter (HOSPITAL_COMMUNITY): Payer: Self-pay | Admitting: Psychiatry

## 2017-11-20 VITALS — BP 151/82 | HR 52 | Ht 62.0 in | Wt 177.0 lb

## 2017-11-20 DIAGNOSIS — F419 Anxiety disorder, unspecified: Secondary | ICD-10-CM | POA: Diagnosis not present

## 2017-11-20 DIAGNOSIS — Z6229 Other upbringing away from parents: Secondary | ICD-10-CM

## 2017-11-20 DIAGNOSIS — Z62810 Personal history of physical and sexual abuse in childhood: Secondary | ICD-10-CM | POA: Diagnosis not present

## 2017-11-20 DIAGNOSIS — F17210 Nicotine dependence, cigarettes, uncomplicated: Secondary | ICD-10-CM

## 2017-11-20 DIAGNOSIS — R441 Visual hallucinations: Secondary | ICD-10-CM

## 2017-11-20 DIAGNOSIS — R450 Nervousness: Secondary | ICD-10-CM | POA: Diagnosis not present

## 2017-11-20 DIAGNOSIS — F4310 Post-traumatic stress disorder, unspecified: Secondary | ICD-10-CM

## 2017-11-20 DIAGNOSIS — F3162 Bipolar disorder, current episode mixed, moderate: Secondary | ICD-10-CM

## 2017-11-20 DIAGNOSIS — G4700 Insomnia, unspecified: Secondary | ICD-10-CM

## 2017-11-20 DIAGNOSIS — R440 Auditory hallucinations: Secondary | ICD-10-CM

## 2017-11-20 DIAGNOSIS — Z818 Family history of other mental and behavioral disorders: Secondary | ICD-10-CM | POA: Diagnosis not present

## 2017-11-20 DIAGNOSIS — R45851 Suicidal ideations: Secondary | ICD-10-CM

## 2017-11-20 MED ORDER — VORTIOXETINE HBR 10 MG PO TABS: 10.0000 mg | ORAL_TABLET | Freq: Every day | ORAL | 0 refills | Status: DC

## 2017-11-20 MED ORDER — TRAZODONE HCL 50 MG PO TABS: ORAL_TABLET | ORAL | 0 refills | Status: DC

## 2017-11-20 NOTE — Patient Instructions (Addendum)
1. Discontinue duloxetine  2. Start Trintellix 5 mg daily for one week, then 10 mg daily  3. Contiue Abilify 2 mg daily  4. Start trazodone 25-50 mg at night as needed for sleep 5. Return to clinic in one month for 30 mins

## 2017-11-23 ENCOUNTER — Telehealth (HOSPITAL_COMMUNITY): Payer: Self-pay | Admitting: *Deleted

## 2017-11-23 NOTE — Telephone Encounter (Signed)
Dr Alvino Chapel with patient & she had already started taking 1/2 of the 50  Mg  & still was ill. She has since stopped taking.

## 2017-11-23 NOTE — Telephone Encounter (Signed)
Another possibility may be trintellix. Advise her to discontinue it and see it helps (if there was no change even she discontinued trazodone.) Also be sure to be checked by PCP if no improvement in her symptoms.

## 2017-11-23 NOTE — Telephone Encounter (Signed)
Dr Modesta Messing Patient called in stating that she was ill this weekend & that she feels as though the Trazadone  is what made her ill. Requesting instruction on how to proceed with medication. ??'s to continue other med's ?? d/c or continue Trazodone . # [phone]

## 2017-11-23 NOTE — Telephone Encounter (Signed)
If she takes Trazodone 25 mg- advise her to discontinue  If she takes Trazodone 50 mg- advise her to decrease to 25 mg (or discontinue) to see if it helps for her symptoms.

## 2017-11-24 ENCOUNTER — Telehealth: Payer: Self-pay | Admitting: Family Medicine

## 2017-11-24 ENCOUNTER — Encounter (HOSPITAL_COMMUNITY): Payer: Self-pay | Admitting: Psychiatry

## 2017-11-24 ENCOUNTER — Ambulatory Visit (INDEPENDENT_AMBULATORY_CARE_PROVIDER_SITE_OTHER): Payer: Medicare Other | Admitting: Psychiatry

## 2017-11-24 DIAGNOSIS — F1011 Alcohol abuse, in remission: Secondary | ICD-10-CM

## 2017-11-24 DIAGNOSIS — Z6379 Other stressful life events affecting family and household: Secondary | ICD-10-CM

## 2017-11-24 DIAGNOSIS — F4310 Post-traumatic stress disorder, unspecified: Secondary | ICD-10-CM

## 2017-11-24 DIAGNOSIS — Z62810 Personal history of physical and sexual abuse in childhood: Secondary | ICD-10-CM | POA: Diagnosis not present

## 2017-11-24 DIAGNOSIS — Z91411 Personal history of adult psychological abuse: Secondary | ICD-10-CM

## 2017-11-24 DIAGNOSIS — F319 Bipolar disorder, unspecified: Secondary | ICD-10-CM

## 2017-11-24 DIAGNOSIS — F1211 Cannabis abuse, in remission: Secondary | ICD-10-CM

## 2017-11-24 DIAGNOSIS — F3162 Bipolar disorder, current episode mixed, moderate: Secondary | ICD-10-CM

## 2017-11-24 DIAGNOSIS — Z91410 Personal history of adult physical and sexual abuse: Secondary | ICD-10-CM | POA: Diagnosis not present

## 2017-11-24 NOTE — Telephone Encounter (Signed)
She would need to be seen and evaluated for this at our office or we could refer her to [facility]. Please let us know what she prefers. Gwen Her. Mannie Stabile, MD

## 2017-11-24 NOTE — Telephone Encounter (Signed)
Called patient regarding message below. No answer, left generic message for patient to return call.   
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
## 2017-11-24 NOTE — Telephone Encounter (Signed)
Patient called in to request an appt for vaginal itch around the outside of her vagina. She states it doesn't feel like a yeast infection however she thinks there is a cyst in the "hood" part. Monistat otc is not helping her.  [phone]  * as of right now she does not have ab obgyn

## 2017-11-24 NOTE — Telephone Encounter (Signed)
Please advise if you will work her in or refer her.

## 2017-11-24 NOTE — Telephone Encounter (Signed)
Please advise 

## 2017-11-24 NOTE — Telephone Encounter (Signed)
Pt is calling, returning a call to [facility], she needs an appointment, she would rather see Dr Mannie Stabile, but if nothing available would see a GYN.

## 2017-11-25 ENCOUNTER — Other Ambulatory Visit (HOSPITAL_COMMUNITY)
Admission: RE | Admit: 2017-11-25 | Discharge: 2017-11-25 | Disposition: A | Discharge status: Home / Self Care | Payer: Medicare Other | Source: Ambulatory Visit | Attending: Family Medicine | Admitting: Family Medicine

## 2017-11-25 ENCOUNTER — Ambulatory Visit (INDEPENDENT_AMBULATORY_CARE_PROVIDER_SITE_OTHER): Payer: Medicare Other | Admitting: Family Medicine

## 2017-11-25 ENCOUNTER — Other Ambulatory Visit: Payer: Self-pay

## 2017-11-25 ENCOUNTER — Encounter: Payer: Self-pay | Admitting: Family Medicine

## 2017-11-25 VITALS — BP 158/72 | HR 86 | Temp 97.2°F | Resp 16 | Ht 62.0 in | Wt 177.0 lb

## 2017-11-25 DIAGNOSIS — Z124 Encounter for screening for malignant neoplasm of cervix: Secondary | ICD-10-CM | POA: Insufficient documentation

## 2017-11-25 DIAGNOSIS — B373 Candidiasis of vulva and vagina: Secondary | ICD-10-CM | POA: Diagnosis not present

## 2017-11-25 DIAGNOSIS — B3731 Acute candidiasis of vulva and vagina: Secondary | ICD-10-CM

## 2017-11-25 DIAGNOSIS — Z113 Encounter for screening for infections with a predominantly sexual mode of transmission: Secondary | ICD-10-CM | POA: Diagnosis not present

## 2017-11-25 MED ORDER — TERCONAZOLE 0.4 % VA CREA
1.0000 | TOPICAL_CREAM | Freq: Every day | VAGINAL | 0 refills | Status: DC
Start: 2017-11-25 — End: 2017-11-27

## 2017-11-25 MED ORDER — FLUCONAZOLE 150 MG PO TABS: ORAL_TABLET | ORAL | 0 refills | Status: DC

## 2017-11-25 MED ORDER — FLUCONAZOLE 150 MG PO TABS: 150.0000 mg | ORAL_TABLET | Freq: Once | ORAL | 0 refills | Status: DC
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
## 2017-11-25 NOTE — Telephone Encounter (Signed)
Pt is calling in again, wants to know either way, as it is driving her crazy

## 2017-11-25 NOTE — Telephone Encounter (Signed)
lvm for patient to be here at 1 today.

## 2017-11-25 NOTE — Telephone Encounter (Signed)
See if she can come in tomorrow at 11:40 or today at 1pm.

## 2017-11-26 ENCOUNTER — Telehealth: Payer: Self-pay | Admitting: Family Medicine

## 2017-11-26 NOTE — Telephone Encounter (Signed)
Patient has no relief what so ever from the oral medication given at her most recent appt, says she is even worse, she wokeup scratching this morning and seen blood from where she had scratched herself. Does she need another appt ?    [phone]

## 2017-11-26 NOTE — Telephone Encounter (Signed)
Pt is calling and there is no improvement, she woke up scratching,, could it be jock itch.Marland Kitchen

## 2017-11-26 NOTE — Telephone Encounter (Signed)
Please advise that the relief may not be immediate. It is less than 24 hours since she was seen and given medications. Advise that if not better, I would like her to be seen at [facility]: [facility] [address] [address]. Please give info regarding getting/scheduling an appointment. Gwen Her. Mannie Stabile, MD

## 2017-11-27 ENCOUNTER — Ambulatory Visit (INDEPENDENT_AMBULATORY_CARE_PROVIDER_SITE_OTHER): Payer: Medicare Other | Admitting: Obstetrics & Gynecology

## 2017-11-27 ENCOUNTER — Encounter: Payer: Self-pay | Admitting: Obstetrics & Gynecology

## 2017-11-27 VITALS — BP 148/70 | HR 71 | Ht 62.0 in | Wt 178.0 lb

## 2017-11-27 DIAGNOSIS — N952 Postmenopausal atrophic vaginitis: Secondary | ICD-10-CM | POA: Diagnosis not present

## 2017-11-27 DIAGNOSIS — B3731 Acute candidiasis of vulva and vagina: Secondary | ICD-10-CM

## 2017-11-27 DIAGNOSIS — B373 Candidiasis of vulva and vagina: Secondary | ICD-10-CM

## 2017-11-27 DIAGNOSIS — L900 Lichen sclerosus et atrophicus: Secondary | ICD-10-CM | POA: Diagnosis not present

## 2017-11-27 MED ORDER — FLUCONAZOLE 100 MG PO TABS: 100.0000 mg | ORAL_TABLET | Freq: Every day | ORAL | 0 refills | Status: DC

## 2017-11-27 NOTE — Telephone Encounter (Signed)
Please get her an appointment at [facility] ASAP. Thank you.
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
## 2017-11-30 ENCOUNTER — Encounter (HOSPITAL_COMMUNITY): Payer: Self-pay | Admitting: Psychiatry

## 2017-11-30 ENCOUNTER — Encounter: Payer: Self-pay | Admitting: Obstetrics & Gynecology

## 2017-11-30 ENCOUNTER — Ambulatory Visit (INDEPENDENT_AMBULATORY_CARE_PROVIDER_SITE_OTHER): Payer: Medicare Other | Admitting: Psychiatry

## 2017-11-30 DIAGNOSIS — F4310 Post-traumatic stress disorder, unspecified: Secondary | ICD-10-CM | POA: Diagnosis not present

## 2017-11-30 DIAGNOSIS — F3162 Bipolar disorder, current episode mixed, moderate: Secondary | ICD-10-CM | POA: Diagnosis not present

## 2017-11-30 LAB — CERVICOVAGINAL ANCILLARY ONLY: Herpes: NEGATIVE

## 2017-11-30 NOTE — Telephone Encounter (Signed)
Spoke with patient and she stated she had already had an appointment with Dr.Eure at [facility] ob/gyn. She is using the medication he prescribed although she doesn't feel it is helping. She has a lot of itching. She was not at all happy with Dr.Eure and his approach and will not be following up with him. Patient would like for Dr.Hagler to call her so that she may discuss what happened during the visit. Told patient I will forward message to Dr.Hagler to let her know.
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
## 2017-12-01 LAB — CYTOLOGY - PAP
CHLAMYDIA, DNA PROBE: NEGATIVE
Candida vaginitis: NEGATIVE
Diagnosis: NEGATIVE
HPV: NOT DETECTED
NEISSERIA GONORRHEA: NEGATIVE
TRICH (WINDOWPATH): NEGATIVE

## 2017-12-01 NOTE — Telephone Encounter (Signed)
Spoke with patient. Itching is no better.

## 2017-12-01 NOTE — Telephone Encounter (Signed)
Please refer patient to another gyn. Dr. Elonda Husky is wanting her to follow up with him in 3 weeks after completing the medications that she was given. Based upon her comments, I am guessing that she does not want to see Dr. Elonda Husky. Would she like to see Dr. Bing Quarry or referral to another gynecologist?
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
## 2017-12-02 NOTE — Telephone Encounter (Signed)
Patient wants to see a different doctor at a different practice, I recommended Dr.Silva

## 2017-12-02 NOTE — Progress Notes (Signed)
Spoke with patient and gave test results with verbal understanding.

## 2017-12-02 NOTE — Telephone Encounter (Signed)
Please assist patient in getting an appointment. [name]. Tammy Stabile, MD

## 2017-12-08 ENCOUNTER — Ambulatory Visit (INDEPENDENT_AMBULATORY_CARE_PROVIDER_SITE_OTHER): Payer: Medicare Other | Admitting: Family Medicine

## 2017-12-08 ENCOUNTER — Encounter (HOSPITAL_COMMUNITY): Payer: Self-pay | Admitting: *Deleted

## 2017-12-08 ENCOUNTER — Other Ambulatory Visit: Payer: Self-pay

## 2017-12-08 ENCOUNTER — Encounter: Payer: Self-pay | Admitting: Family Medicine

## 2017-12-08 ENCOUNTER — Emergency Department (HOSPITAL_COMMUNITY)
Admission: EM | Admit: 2017-12-08 | Discharge: 2017-12-08 | Disposition: A | Discharge status: Home / Self Care | Payer: Medicare Other | Attending: Emergency Medicine | Admitting: Emergency Medicine

## 2017-12-08 VITALS — BP 152/86 | HR 54 | Temp 98.6°F | Resp 14 | Ht 62.0 in | Wt 179.0 lb

## 2017-12-08 DIAGNOSIS — Z85828 Personal history of other malignant neoplasm of skin: Secondary | ICD-10-CM | POA: Diagnosis not present

## 2017-12-08 DIAGNOSIS — R42 Dizziness and giddiness: Secondary | ICD-10-CM

## 2017-12-08 DIAGNOSIS — N760 Acute vaginitis: Secondary | ICD-10-CM

## 2017-12-08 DIAGNOSIS — Z8541 Personal history of malignant neoplasm of cervix uteri: Secondary | ICD-10-CM | POA: Diagnosis not present

## 2017-12-08 DIAGNOSIS — J449 Chronic obstructive pulmonary disease, unspecified: Secondary | ICD-10-CM | POA: Insufficient documentation

## 2017-12-08 DIAGNOSIS — I10 Essential (primary) hypertension: Secondary | ICD-10-CM | POA: Diagnosis not present

## 2017-12-08 DIAGNOSIS — R001 Bradycardia, unspecified: Secondary | ICD-10-CM

## 2017-12-08 DIAGNOSIS — Z79899 Other long term (current) drug therapy: Secondary | ICD-10-CM | POA: Insufficient documentation

## 2017-12-08 DIAGNOSIS — F17210 Nicotine dependence, cigarettes, uncomplicated: Secondary | ICD-10-CM | POA: Diagnosis not present

## 2017-12-08 DIAGNOSIS — R0602 Shortness of breath: Secondary | ICD-10-CM | POA: Insufficient documentation

## 2017-12-08 DIAGNOSIS — I2510 Atherosclerotic heart disease of native coronary artery without angina pectoris: Secondary | ICD-10-CM | POA: Diagnosis not present

## 2017-12-08 LAB — CBC WITH DIFFERENTIAL/PLATELET
BASOS ABS: 0.1 10*3/uL (ref 0.0–0.1)
BASOS PCT: 1 %
Eosinophils Absolute: 0.3 10*3/uL (ref 0.0–0.7)
Eosinophils Relative: 3 %
HEMATOCRIT: 45.1 % (ref 36.0–46.0)
HEMOGLOBIN: 14.5 g/dL (ref 12.0–15.0)
LYMPHS PCT: 41 %
Lymphs Abs: 4.6 10*3/uL — ABNORMAL HIGH (ref 0.7–4.0)
MCH: 29.5 pg (ref 26.0–34.0)
MCHC: 32.2 g/dL (ref 30.0–36.0)
MCV: 91.9 fL (ref 78.0–100.0)
Monocytes Absolute: 0.5 10*3/uL (ref 0.1–1.0)
Monocytes Relative: 5 %
NEUTROS ABS: 5.7 10*3/uL (ref 1.7–7.7)
NEUTROS PCT: 50 %
Platelets: 333 10*3/uL (ref 150–400)
RBC: 4.91 MIL/uL (ref 3.87–5.11)
RDW: 12.8 % (ref 11.5–15.5)
WBC: 11.2 10*3/uL — AB (ref 4.0–10.5)

## 2017-12-08 LAB — COMPREHENSIVE METABOLIC PANEL
ALBUMIN: 4 g/dL (ref 3.5–5.0)
ALK PHOS: 82 U/L (ref 38–126)
ALT: 19 U/L (ref 14–54)
AST: 16 U/L (ref 15–41)
Anion gap: 8 (ref 5–15)
BILIRUBIN TOTAL: 1.1 mg/dL (ref 0.3–1.2)
BUN: 9 mg/dL (ref 6–20)
CO2: 28 mmol/L (ref 22–32)
CREATININE: 0.8 mg/dL (ref 0.44–1.00)
Calcium: 9.1 mg/dL (ref 8.9–10.3)
Chloride: 104 mmol/L (ref 101–111)
GFR calc Af Amer: >60 mL/min (ref >60)
Glucose, Bld: 98 mg/dL (ref 65–99)
POTASSIUM: 3.8 mmol/L (ref 3.5–5.1)
Sodium: 140 mmol/L (ref 135–145)
TOTAL PROTEIN: 7.5 g/dL (ref 6.5–8.1)

## 2017-12-08 LAB — TROPONIN I

## 2017-12-08 MED ORDER — SODIUM CHLORIDE 0.9 % IV BOLUS
500.0000 mL | Freq: Once | INTRAVENOUS | Status: AC
  Administered 2017-12-08: 500 mL via INTRAVENOUS

## 2017-12-08 NOTE — ED Triage Notes (Signed)
Pt brought in by [facility] with c/o dizziness and SOB after sitting up after lying down. Pt was at her regular doctor's appt today she was having an EKG done and when she laid down her HR dropped to 46bpm and pt became dizzy and SOB. Pt reports increase in Metoprolol from 25mg  to 50mg  BID dosing approximately 1-1.5 months ago. Pt reports the symptoms started a little before the change in Metoprolol, but symptoms have gotten worse in the last week and a half.
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
## 2017-12-08 NOTE — ED Provider Notes (Signed)
[facility] EMERGENCY DEPARTMENT Provider Note   CSN: [id number] Arrival date & time: 12/08/17  5852     History   Chief Complaint Chief Complaint  Patient presents with  . Dizziness  . Shortness of Breath    HPI [name] is a 52 y.o. female.  Patient complains of some dizziness occasionally when she stands up.  She is on Lopressor which was increased couple months ago from 50 mg twice a day to 100 mg twice a day  The history is provided by the patient. No language interpreter was used.  Dizziness  Quality:  Lightheadedness Severity:  Mild Onset quality:  Sudden Timing:  Rare Progression:  Resolved Chronicity:  New Context: not when bending over   Associated symptoms: no chest pain, no diarrhea and no headaches   Shortness of Breath  Pertinent negatives include no headaches, no cough, no chest pain, no abdominal pain and no rash.    Past Medical History:  Diagnosis Date  . Anxiety   . Arthritis    "pretty much all over; mainly neck and back" (12/26/2015)  . Asthma   . Bipolar 1 disorder ([place])   . Bipolar disorder ([place])   . Cervical cancer ([place])    "stage I"  . Chronic back pain   . Chronic bronchitis ([place])   . COPD (chronic obstructive pulmonary disease) ([place])   . Degenerative disc disease   . Depression   . Dysrhythmia   . Fibromyalgia   . GERD (gastroesophageal reflux disease)   . Hepatic cyst   . High cholesterol   . History of hiatal hernia   . Hypertension 08/04/2012  . IBS (irritable bowel syndrome) Diagnosed November 2012  . Incisional hernia with gangrene and obstruction 12/26/2015  . Kidney stones    "passed them"  . Melanoma of back ([place])   . Ovarian cancer ([place])    "stage II"  . Plantar fasciitis, bilateral   . Polycystic kidney disease    ruled out  . Sleep apnea    cpap coming  "soon" (12/26/2015)    Patient Active Problem List   Diagnosis Date Noted  . Prediabetes 10/01/2017  . Coronary artery disease 10/01/2017  . Hepatic  cyst 08/18/2017  . Abdominal pain 08/18/2017  . Fibromyalgia 04/16/2017  . Sacroiliac joint pain 04/16/2017  . Incisional hernia with gangrene and obstruction 12/26/2015  . Incisional hernia with obstruction 12/26/2015  . OSA (obstructive sleep apnea) 12/13/2015  . Irritable bowel syndrome 09/25/2015  . Risk for falls 09/25/2015  . Insomnia 08/14/2015  . Hypertensive urgency 03/19/2015  . Unsteady gait 03/19/2015  . Bipolar I disorder, most recent episode depressed ([place]) 07/04/2014  . PTSD (post-traumatic stress disorder) 01/31/2014  . GAD (generalized anxiety disorder) 01/31/2014  . Chronic bilateral low back pain 08/18/2013  . DDD (degenerative disc disease), cervical 08/18/2013  . DDD (degenerative disc disease), lumbosacral 08/18/2013  . Myalgia and myositis 08/18/2013  . Anxiety state 01/01/2013  . Osteoarthritis 09/14/2012  . Asthma 09/14/2012  . Chronic obstructive pulmonary disease ([place]) 09/14/2012  . Gastro-esophageal reflux disease without esophagitis 09/14/2012  . Essential hypertension 09/14/2012  . Tobacco use 09/14/2012  . Insomnia due to mental disorder 05/28/2012  . Bipolar 1 disorder ([place]) 10/02/2011    Past Surgical History:  Procedure Laterality Date  . ABDOMINAL HYSTERECTOMY  1997  . APPENDECTOMY    . CARDIAC CATHETERIZATION  2015  . CARPAL TUNNEL RELEASE Right   . CESAREAN SECTION  1987  . CHOLECYSTECTOMY OPEN  1998  . COLONOSCOPY  November 2012  . EYE SURGERY     CATARACTS REMOVED 09/09/17 and 09/16/17  . HERNIA REPAIR    . INCISIONAL HERNIA REPAIR N/A 12/26/2015   Procedure: LAPAROSCOPIC REPAIR INCISIONAL HERNIA WITH MESH;  Surgeon: Fanny Skates, MD;  Location: [facility];  Service: General;  Laterality: N/A;  . INSERTION OF MESH N/A 12/26/2015   Procedure: INSERTION OF MESH;  Surgeon: Fanny Skates, MD;  Location: [place];  Service: General;  Laterality: N/A;  . [place]  . LAPAROSCOPIC INCISIONAL / UMBILICAL / VENTRAL  HERNIA REPAIR  12/26/2015   repair of incarcerated incisional hernia with mesh  . LIVER CYST REMOVAL  2000s  . MELANOMA EXCISION  January 2013   removed from back  . SHOULDER SURGERY Left 2010   Humeral Head Microfracture   . TUBAL LIGATION    . UPPER GASTROINTESTINAL ENDOSCOPY  November 2012     OB History    Gravida  6   Para  2   Term  1   Preterm  1   AB  4   Living  2     SAB  4   TAB      Ectopic      Multiple      Live Births  2            Home Medications    Prior to Admission medications   Medication Sig Start Date End Date Taking? Authorizing Provider  albuterol (PROVENTIL HFA;VENTOLIN HFA) 108 (90 BASE) MCG/ACT inhaler Inhale 1-2 puffs into the lungs every 6 (six) hours as needed for wheezing or shortness of breath.   Yes [provider]  ARIPiprazole (ABILIFY) 2 MG tablet Take 1 tablet (2 mg total) by mouth daily. 11/04/17  Yes Hagler, Apolonio Schneiders, MD  fluticasone furoate-vilanterol (BREO ELLIPTA) 100-25 MCG/INH AEPB Inhale 1 puff into the lungs daily. 11/04/17  Yes Hagler, Apolonio Schneiders, MD  losartan (COZAAR) 100 MG tablet Take 1 tablet (100 mg total) by mouth daily. 11/04/17  Yes Caren Macadam, MD  metoprolol tartrate (LOPRESSOR) 25 MG tablet Take 2 tablets (50 mg total) by mouth 2 (two) times daily. 11/04/17  Yes Hagler, Apolonio Schneiders, MD  rosuvastatin (CRESTOR) 20 MG tablet Take 1 tablet (20 mg total) by mouth daily. 11/04/17  Yes Hagler, Apolonio Schneiders, MD  vortioxetine HBr (TRINTELLIX) 10 MG TABS tablet Take 1 tablet (10 mg total) by mouth daily. 11/20/17  Yes Norman Clay, MD    Family History Family History  Adopted: Yes  Problem Relation Age of Onset  . Bipolar disorder Mother        never diagnosed but patient suspects mother had bipolar disorder.Marland KitchenMarland KitchenMarland KitchenMarland Kitchenpt was adopeted, only knew birth mother for a short period of time.  Marland Kitchen Anxiety disorder Mother   . Cirrhosis Mother   . Diabetes Mother   . Turner syndrome Daughter   . Cancer Daughter   . Bipolar disorder  Daughter   . Interstitial cystitis Daughter   . ADD / ADHD Neg Hx   . Alcohol abuse Neg Hx   . Drug abuse Neg Hx   . Dementia Neg Hx   . Depression Neg Hx   . OCD Neg Hx   . Paranoid behavior Neg Hx   . Schizophrenia Neg Hx   . Seizures Neg Hx   . Sexual abuse Neg Hx   . Physical abuse Neg Hx   . Colon cancer Neg Hx     Social History Social History  Tobacco Use  . Smoking status: Current Every Day Smoker    Packs/day: 0.50    Years: 46.00    Pack years: 23.00    Types: Cigarettes  . Smokeless tobacco: Never Used  . Tobacco comment: Patient states she knows she needs to quit but every time she tries, her anxiety worsens   Substance Use Topics  . Alcohol use: No    Alcohol/week: 0.0 oz    Comment: 12/26/2015 'quit in ~ 1997"  . Drug use: No    Comment: 12/26/2015 "quit in ~  2010"     Allergies   Barium-containing compounds; Bee venom; Flu virus vaccine; Seldane [terfenadine]; Sulfa antibiotics; Lisinopril; Lithium; Tetracyclines & related; Trazodone and nefazodone; Zinc; Aspirin; Ativan [lorazepam]; Erythromycin; and Lipitor [atorvastatin]   Review of Systems Review of Systems  Constitutional: Negative for appetite change and fatigue.  HENT: Negative for congestion, ear discharge and sinus pressure.   Eyes: Negative for discharge.  Respiratory: Negative for cough.   Cardiovascular: Negative for chest pain.  Gastrointestinal: Negative for abdominal pain and diarrhea.  Genitourinary: Negative for frequency and hematuria.  Musculoskeletal: Negative for back pain.  Skin: Negative for rash.  Neurological: Positive for dizziness. Negative for seizures and headaches.  Psychiatric/Behavioral: Negative for hallucinations.     Physical Exam Updated Vital Signs BP (!) 170/69 (BP Location: Right Arm)   Pulse (!) 51   Temp 98.3 F (36.8 C) (Oral)   Resp 18   Ht 5\' 2"  (1.575 m)   Wt 81.2 kg (179 lb)   SpO2 98%   BMI 32.74 kg/m   Physical Exam  Constitutional:  She is oriented to person, place, and time. She appears well-developed.  HENT:  Head: Normocephalic.  Eyes: Conjunctivae and EOM are normal. No scleral icterus.  Neck: Neck supple. No thyromegaly present.  Cardiovascular: Regular rhythm. Exam reveals no gallop and no friction rub.  No murmur heard. Bradycardia  Pulmonary/Chest: No stridor. She has no wheezes. She has no rales. She exhibits no tenderness.  Abdominal: She exhibits no distension. There is no tenderness. There is no rebound.  Musculoskeletal: Normal range of motion. She exhibits no edema.  Lymphadenopathy:    She has no cervical adenopathy.  Neurological: She is oriented to person, place, and time. She exhibits normal muscle tone. Coordination normal.  Skin: No rash noted. No erythema.  Psychiatric: She has a normal mood and affect. Her behavior is normal.     ED Treatments / Results  Labs (all labs ordered are listed, but only abnormal results are displayed) Labs Reviewed  CBC WITH DIFFERENTIAL/PLATELET - Abnormal; Notable for the following components:      Result Value   WBC 11.2 (*)    Lymphs Abs 4.6 (*)    All other components within normal limits  COMPREHENSIVE METABOLIC PANEL  TROPONIN I    EKG None  Radiology No results found.  Procedures Procedures (including critical care time)  Medications Ordered in ED Medications  sodium chloride 0.9 % bolus 500 mL (0 mLs Intravenous Stopped 12/08/17 1146)     Initial Impression / Assessment and Plan / ED Course  I have reviewed the triage vital signs and the nursing notes.  Pertinent labs & imaging results that were available during my care of the patient were reviewed by me and considered in my medical decision making (see chart for details).     Patient with dizziness and bradycardia.  Labs including CBC chemistries troponin are unremarkable.  Patient has a left bundle  branch block in her EKG but this is her standard.  Suspect bradycardia could be  causing some symptoms.  We will decrease her Lopressor from 100 mg twice a day to 75 mg twice a day and she will follow-up with either her cardiologist or her primary care doctor next week  Final Clinical Impressions(s) / ED Diagnoses   Final diagnoses:  Dizziness    ED Discharge Orders    None       Milton Ferguson, MD 12/08/17 1419

## 2017-12-08 NOTE — ED Notes (Signed)
Pt ambulated to restroom with no difficulty at this time.

## 2017-12-08 NOTE — Discharge Instructions (Addendum)
Change your Lopressor so you will take one and a half pills twice a day.  Follow-up with either your family doctor or your cardiologist next week for recheck

## 2017-12-10 ENCOUNTER — Telehealth: Payer: Self-pay | Admitting: Family Medicine

## 2017-12-10 ENCOUNTER — Ambulatory Visit: Payer: Medicare Other | Admitting: Orthopaedic Surgery

## 2017-12-10 NOTE — Telephone Encounter (Signed)
Great news. Advise her to keep her scheduled follow up and she needs to be monitoring her heart rate.

## 2017-12-10 NOTE — Telephone Encounter (Signed)
Patient called in to let you know she is doing a lot better & she appreciates everything from everyone when she was in the office this week.

## 2017-12-18 ENCOUNTER — Ambulatory Visit (INDEPENDENT_AMBULATORY_CARE_PROVIDER_SITE_OTHER): Payer: Medicare Other | Admitting: Family Medicine

## 2017-12-18 ENCOUNTER — Encounter: Payer: Self-pay | Admitting: Family Medicine

## 2017-12-18 VITALS — BP 170/88 | HR 66 | Resp 16 | Ht 62.0 in | Wt 178.0 lb

## 2017-12-18 DIAGNOSIS — R001 Bradycardia, unspecified: Secondary | ICD-10-CM | POA: Diagnosis not present

## 2017-12-18 DIAGNOSIS — I10 Essential (primary) hypertension: Secondary | ICD-10-CM

## 2017-12-18 MED ORDER — CLONIDINE 0.2 MG/24HR TD PTWK: 0.2000 mg | MEDICATED_PATCH | TRANSDERMAL | 12 refills | Status: DC
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
## 2017-12-22 ENCOUNTER — Other Ambulatory Visit: Payer: Self-pay

## 2017-12-22 ENCOUNTER — Emergency Department (HOSPITAL_COMMUNITY)
Admission: EM | Admit: 2017-12-22 | Discharge: 2017-12-22 | Disposition: A | Discharge status: Home / Self Care | Payer: Medicare Other

## 2017-12-22 ENCOUNTER — Telehealth: Payer: Self-pay | Admitting: Family Medicine

## 2017-12-22 MED ORDER — ALBUTEROL SULFATE HFA 108 (90 BASE) MCG/ACT IN AERS: 1.0000 | INHALATION_SPRAY | Freq: Four times a day (QID) | RESPIRATORY_TRACT | 1 refills | Status: DC | PRN

## 2017-12-22 NOTE — Telephone Encounter (Signed)
Please find out what type of reaction she is having. What symptoms is she having? If she had an allergic reaction and is having SOB or difficulty breathing, she needs to be evaluated at the ED.  Gwen Her. Mannie Stabile, MD

## 2017-12-22 NOTE — Telephone Encounter (Signed)
Please advise patient that I would recommend her starting on a low dose of a calcium channel blocker for her blood pressure.  She can either do this or she can wait till she follows up with Dr. Harl Bowie to discuss her blood pressure.  Please let me know which she would like to choose.

## 2017-12-22 NOTE — Telephone Encounter (Signed)
Patient says she thinks she had an asthma attack, the area where the patch was located was red, and below where the patch was is red and hot.  Also requesting a refill on pro air rescue inhaler. Cb#: 336/ [phone]

## 2017-12-22 NOTE — Telephone Encounter (Signed)
Pt is having reaction to the Clonidine patch 10pm on Sunday and the pt has removed it from her arm.. and also had asthma attack on Sunday night. Pt needs rescue inhaler called in.  Also needs help with quitting smoking.
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
## 2017-12-22 NOTE — Telephone Encounter (Signed)
Called patient to discuss Dr.Hagler's recommendations. Patient stated while she was wearing the patch her BP was 109/74 P 61 (pt removed patch 12/21/17 @ 10pm.) Patient's BP today while on the phone with me was 161/76  P 69. Stressed the importance of seeking immediate medical attention at the nearest ED for shortness of breath or other breathing issues with verbal understanding from patient. Let patient know refill for inhaler will be sent in.

## 2017-12-22 NOTE — Telephone Encounter (Signed)
D/c the patch. Please send in refill for her inhaler. If she is having any problems breathing or SOB, needs to go to the ED.  Is she checking her BP at home? How is it running?

## 2017-12-23 NOTE — Telephone Encounter (Signed)
Please call the pt regarding her BP and what she needs to do

## 2017-12-24 ENCOUNTER — Ambulatory Visit (INDEPENDENT_AMBULATORY_CARE_PROVIDER_SITE_OTHER): Payer: Medicare Other | Admitting: Orthopaedic Surgery

## 2017-12-24 ENCOUNTER — Encounter: Payer: Self-pay | Admitting: Orthopaedic Surgery

## 2017-12-24 VITALS — BP 182/93 | HR 64 | Temp 98.1°F

## 2017-12-24 DIAGNOSIS — F17210 Nicotine dependence, cigarettes, uncomplicated: Secondary | ICD-10-CM

## 2017-12-24 DIAGNOSIS — G5603 Carpal tunnel syndrome, bilateral upper limbs: Secondary | ICD-10-CM | POA: Diagnosis not present
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
## 2017-12-24 NOTE — Telephone Encounter (Signed)
Spoke with patient and discussed Dr.Hagler's recommendations. She stated she is opened to which ever Dr.Hagler prefers to do at this time. She was at the orthopedic doctor and at that time her bp was 182/93. I told her I would forward Dr.Hagler the message, however, she wouldn't get it until tomorrow with verbal understanding.

## 2017-12-24 NOTE — Patient Instructions (Signed)
Steps to Quit Smoking Smoking tobacco can be bad for your health. It can also affect almost every organ in your body. Smoking puts you and people around you at risk for many serious long-lasting (chronic) diseases. Quitting smoking is hard, but it is one of the best things that you can do for your health. It is never too late to quit. What are the benefits of quitting smoking? When you quit smoking, you lower your risk for getting serious diseases and conditions. They can include:  Lung cancer or lung disease.  Heart disease.  Stroke.  Heart attack.  Not being able to have children (infertility).  Weak bones (osteoporosis) and broken bones (fractures).  If you have coughing, wheezing, and shortness of breath, those symptoms may get better when you quit. You may also get sick less often. If you are pregnant, quitting smoking can help to lower your chances of having a baby of low birth weight. What can I do to help me quit smoking? Talk with your doctor about what can help you quit smoking. Some things you can do (strategies) include:  Quitting smoking totally, instead of slowly cutting back how much you smoke over a period of time.  Going to in-person counseling. You are more likely to quit if you go to many counseling sessions.  Using resources and support systems, such as: ? Online chats with a counselor. ? Phone quitlines. ? Printed self-help materials. ? Support groups or group counseling. ? Text messaging programs. ? Mobile phone apps or applications.  Taking medicines. Some of these medicines may have nicotine in them. If you are pregnant or breastfeeding, do not take any medicines to quit smoking unless your doctor says it is okay. Talk with your doctor about counseling or other things that can help you.  Talk with your doctor about using more than one strategy at the same time, such as taking medicines while you are also going to in-person counseling. This can help make  quitting easier. What things can I do to make it easier to quit? Quitting smoking might feel very hard at first, but there is a lot that you can do to make it easier. Take these steps:  Talk to your family and friends. Ask them to support and encourage you.  Call phone quitlines, reach out to support groups, or work with a counselor.  Ask people who smoke to not smoke around you.  Avoid places that make you want (trigger) to smoke, such as: ? Bars. ? Parties. ? Smoke-break areas at work.  Spend time with people who do not smoke.  Lower the stress in your life. Stress can make you want to smoke. Try these things to help your stress: ? Getting regular exercise. ? Deep-breathing exercises. ? Yoga. ? Meditating. ? Doing a body scan. To do this, close your eyes, focus on one area of your body at a time from head to toe, and notice which parts of your body are tense. Try to relax the muscles in those areas.  Download or buy apps on your mobile phone or tablet that can help you stick to your quit plan. There are many free apps, such as QuitGuide from the CDC (Centers for Disease Control and Prevention). You can find more support from smokefree.gov and other websites.  This information is not intended to replace advice given to you by your health care provider. Make sure you discuss any questions you have with your health care provider. Document Released: 05/10/2009 Document   Revised: 03/11/2016 Document Reviewed: 11/28/2014 Elsevier Interactive Patient Education  2018 Elsevier Inc.  

## 2017-12-25 ENCOUNTER — Encounter (HOSPITAL_COMMUNITY): Payer: Self-pay | Admitting: Psychiatry

## 2017-12-25 ENCOUNTER — Encounter: Payer: Self-pay | Admitting: Family Medicine

## 2017-12-25 ENCOUNTER — Ambulatory Visit (INDEPENDENT_AMBULATORY_CARE_PROVIDER_SITE_OTHER): Payer: Medicare Other | Admitting: Psychiatry

## 2017-12-25 ENCOUNTER — Telehealth: Payer: Self-pay | Admitting: Family Medicine

## 2017-12-25 VITALS — BP 160/96 | HR 79 | Ht 62.0 in | Wt 176.0 lb

## 2017-12-25 DIAGNOSIS — F3130 Bipolar disorder, current episode depressed, mild or moderate severity, unspecified: Secondary | ICD-10-CM

## 2017-12-25 DIAGNOSIS — F4310 Post-traumatic stress disorder, unspecified: Secondary | ICD-10-CM | POA: Diagnosis not present

## 2017-12-25 DIAGNOSIS — F3162 Bipolar disorder, current episode mixed, moderate: Secondary | ICD-10-CM | POA: Insufficient documentation

## 2017-12-25 MED ORDER — AMLODIPINE BESYLATE 5 MG PO TABS: 5.0000 mg | ORAL_TABLET | Freq: Every day | ORAL | 3 refills | Status: DC

## 2017-12-25 MED ORDER — ARIPIPRAZOLE 5 MG PO TABS: 5.0000 mg | ORAL_TABLET | Freq: Every day | ORAL | 0 refills | Status: DC

## 2017-12-25 MED ORDER — VORTIOXETINE HBR 10 MG PO TABS: 10.0000 mg | ORAL_TABLET | Freq: Every day | ORAL | 0 refills | Status: DC

## 2017-12-25 NOTE — Telephone Encounter (Signed)
I put this in a telephone encounter.  Sorry I thought this message Trail would be added.  She should start on amlodipine 5 mg, 1 p.o. daily.  This is been sent into her pharmacy.  She needs to follow-up for blood pressure check within the next 2 weeks.

## 2017-12-25 NOTE — Telephone Encounter (Signed)
Spoke with patient and advised of Dr.Hagler's recommendation to start Norvasc 5mg  by mouth qd with verbal understanding. She has an appt to see Dr.Hagler next week. Advised her to keep this appt.

## 2017-12-25 NOTE — Patient Instructions (Signed)
1. Discontinue duloxetine  2. Continue Trintellix 10 mg daily  3. Increase Abilify 5 mg daily  4. Discontinue trazodone 5. Return to clinic in one month for 30 mins

## 2017-12-25 NOTE — Telephone Encounter (Signed)
Please call and advise patient to start Norvasc 5 mg, 1 p.o. daily.  Will need to follow-up in a couple weeks for blood pressure check.  This is been sent to her pharmacy.

## 2017-12-25 NOTE — Telephone Encounter (Signed)
Patient advised.

## 2017-12-28 ENCOUNTER — Encounter (HOSPITAL_COMMUNITY): Payer: Self-pay | Admitting: Psychiatry

## 2017-12-28 ENCOUNTER — Ambulatory Visit (INDEPENDENT_AMBULATORY_CARE_PROVIDER_SITE_OTHER): Payer: Medicare Other | Admitting: Psychiatry

## 2017-12-28 DIAGNOSIS — F4310 Post-traumatic stress disorder, unspecified: Secondary | ICD-10-CM
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
## 2017-12-29 ENCOUNTER — Ambulatory Visit (INDEPENDENT_AMBULATORY_CARE_PROVIDER_SITE_OTHER): Payer: Medicare Other | Admitting: Family Medicine

## 2017-12-29 ENCOUNTER — Other Ambulatory Visit: Payer: Self-pay

## 2017-12-29 ENCOUNTER — Encounter: Payer: Self-pay | Admitting: Family Medicine

## 2017-12-29 VITALS — BP 148/82 | HR 67 | Temp 98.7°F | Resp 14 | Ht 62.0 in | Wt 179.0 lb

## 2017-12-29 DIAGNOSIS — Z720 Tobacco use: Secondary | ICD-10-CM

## 2017-12-29 DIAGNOSIS — I10 Essential (primary) hypertension: Secondary | ICD-10-CM | POA: Diagnosis not present

## 2017-12-29 MED ORDER — VARENICLINE TARTRATE 0.5 MG X 11 & 1 MG X 42 PO MISC: ORAL | 0 refills | Status: DC
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
## 2017-12-30 ENCOUNTER — Ambulatory Visit: Payer: Self-pay | Admitting: Family Medicine

## 2018-01-01 ENCOUNTER — Telehealth: Payer: Self-pay | Admitting: Clinical

## 2018-01-01 ENCOUNTER — Encounter: Payer: Self-pay | Admitting: Family Medicine

## 2018-01-01 NOTE — Telephone Encounter (Signed)
[facility] Follow Up Assessment  MRN: [id number] NAME: Tammy DEMELO Date: @TODAY @  Start time: 11:38AM End time: 11:50AM Total time: 12 minutes  Type of Contact: Follow up Call  Current concerns/stressors: None reported  Screens/Assessment Tools:  PHQ-9 & GAD-7 Assessments: This is an evidence based assessment tool for depression and anxiety symptoms in adolescents and adults.  Score cut-off points for each section are as follows: 5-9: Mild, 10-14: Moderate, 15+: Severe  PHQ-9 for Depression =10 GAD-7 for Anxiety = 9  Depression screen [facility] 2/9 01/01/2018 12/29/2017 12/18/2017 12/08/2017 11/27/2017  Decreased Interest 1 2 1  0 2  Down, Depressed, Hopeless 1 2 1  0 1  PHQ - 2 Score 2 4 2  0 3  Altered sleeping 2 2 1 3  0  Tired, decreased energy 2 2 2 3 3   Change in appetite 2 2 2 3 3   Feeling bad or failure about yourself  1 1 1  0 1  Trouble concentrating 1 1 2 1 2   Moving slowly or fidgety/restless 0 0 1 0 0  Suicidal thoughts 0 0 0 0 0  PHQ-9 Score 10 12 11 10 12   Difficult doing work/chores Very difficult Very difficult Somewhat difficult Somewhat difficult Very difficult  Some encounter information is confidential and restricted. Go to Review Flowsheets activity to see all data.  Some recent data might be hidden    GAD 7 : Generalized Anxiety Score 01/01/2018 11/18/2017 10/22/2017 10/01/2017  Nervous, Anxious, on Edge 1 1 2 2   Control/stop worrying 1 0 1 2  Worry too much - different things 2 1 2 1   Trouble relaxing 2 1 1 2   Restless 2 0 0 2  Easily annoyed or irritable 1 0 0 0  Afraid - awful might happen 0 1 2 1   Total GAD 7 Score 9 4 8 10   Anxiety Difficulty Somewhat difficult Not difficult at all Somewhat difficult -  Some encounter information is confidential and restricted. Go to Review Flowsheets activity to see all data.   Functional Assessment:  Sleep: Trouble falling and staying a sleep. Appetite: Overeating and poor appetite.  Coping ability: Pt feels  capable of handling current life circumstances. She reported that she feels like she can reach out to others if she needed help.  Patient taking medications as prescribed: Yes  Current medications:  Outpatient Encounter Medications as of 12/10/2017  Medication Sig   fluticasone furoate-vilanterol (BREO ELLIPTA) 100-25 MCG/INH AEPB Inhale 1 puff into the lungs daily.   losartan (COZAAR) 100 MG tablet Take 1 tablet (100 mg total) by mouth daily.   rosuvastatin (CRESTOR) 20 MG tablet Take 1 tablet (20 mg total) by mouth daily.   [DISCONTINUED] albuterol (PROVENTIL HFA;VENTOLIN HFA) 108 (90 BASE) MCG/ACT inhaler Inhale 1-2 puffs into the lungs every 6 (six) hours as needed for wheezing or shortness of breath.   [DISCONTINUED] ARIPiprazole (ABILIFY) 2 MG tablet Take 1 tablet (2 mg total) by mouth daily.   [DISCONTINUED] metoprolol tartrate (LOPRESSOR) 25 MG tablet Take 2 tablets (50 mg total) by mouth 2 (two) times daily. (Patient not taking: Reported on 12/18/2017)   [DISCONTINUED] vortioxetine HBr (TRINTELLIX) 10 MG TABS tablet Take 1 tablet (10 mg total) by mouth daily.   No facility-administered encounter medications on file as of 12/10/2017.     Self-harm and/or Suicidal Behaviors Risk Assessment Self-harm risk factors: N/A Patient endorses recent self injurious thoughts and/or behaviors: No Suicide ideations: No plan to harm self or others   Danger to  Others Risk Assessment Danger to others risk factors: N/A Patient endorses recent thoughts of harming others: No   Substance Use Assessment Patient recently consumed alcohol: Not assessed.  Patient recently used drugs: Not assessed. Patient is concerned about dependence or abuse of substances: No   Goals, Interventions and Follow-up Plan Goals: Increase healthy adjustment to current life circumstances Interventions: Supportive Counseling   Summary of Clinical Assessment  Patient reported that she is feeling a lot less  depressed and that her medication is working well for her. Patient reported that she is writing things out as a coping strategy which is helping her. Patient also expressed feeling frustrated that she hasn't been able to finish crocheting a blanket that she has been working on for a while.   [place] Intern

## 2018-01-04 ENCOUNTER — Ambulatory Visit (HOSPITAL_COMMUNITY): Payer: Medicaid Other | Admitting: Psychiatry

## 2018-01-04 ENCOUNTER — Telehealth: Payer: Self-pay | Admitting: Family Medicine

## 2018-01-04 NOTE — Telephone Encounter (Signed)
Pt is calling back she went to the Urgent Care -and she has Shingles, and possibly in her Eye

## 2018-01-04 NOTE — Telephone Encounter (Signed)
Spoke with patient and she stated that since she started taking Chantix the right side of her face has broken out going down her right down to her fingers. I advised her to seek medical attention at Urgent Care of the ED with verbal understanding.

## 2018-01-04 NOTE — Telephone Encounter (Signed)
Pt is calling the side of her face is broke out, and she is taken chantix

## 2018-01-05 NOTE — Telephone Encounter (Signed)
Please call and advise that if she has shingles in or near her eye that this is serious and we need to get her in with an eye doctor. Did the [facility] refer her to ophthalmology? Gwen Her. Mannie Stabile, MD

## 2018-01-05 NOTE — Telephone Encounter (Signed)
Spoke with patient and she did go she [facility] yesterday and will follow up with Dr.Shapiro at the end of the month. They told her that her eye was clear. Advised her if she had any questions or concerns to give our office a call. If she had any issues with her eye to call ophthalmology with verbal understanding.

## 2018-01-07 ENCOUNTER — Ambulatory Visit (INDEPENDENT_AMBULATORY_CARE_PROVIDER_SITE_OTHER): Payer: Medicare Other | Admitting: Family Medicine

## 2018-01-07 ENCOUNTER — Other Ambulatory Visit: Payer: Self-pay

## 2018-01-07 ENCOUNTER — Encounter: Payer: Self-pay | Admitting: Family Medicine

## 2018-01-07 VITALS — BP 150/90 | HR 66 | Temp 98.6°F | Resp 14 | Ht 62.0 in | Wt 178.1 lb

## 2018-01-07 DIAGNOSIS — I10 Essential (primary) hypertension: Secondary | ICD-10-CM | POA: Diagnosis not present

## 2018-01-07 DIAGNOSIS — L249 Irritant contact dermatitis, unspecified cause: Secondary | ICD-10-CM | POA: Diagnosis not present

## 2018-01-07 MED ORDER — HYDROXYZINE HCL 25 MG PO TABS: 25.0000 mg | ORAL_TABLET | Freq: Three times a day (TID) | ORAL | 0 refills | Status: DC | PRN

## 2018-01-07 MED ORDER — METHYLPREDNISOLONE ACETATE 80 MG/ML IJ SUSP
80.0000 mg | Freq: Once | INTRAMUSCULAR | Status: AC
  Administered 2018-01-07: 80 mg via INTRAMUSCULAR

## 2018-01-07 MED ORDER — CLONIDINE 0.3 MG/24HR TD PTWK: 0.3000 mg | MEDICATED_PATCH | TRANSDERMAL | 12 refills | Status: DC
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
## 2018-01-07 NOTE — Patient Instructions (Signed)
Call insurance and see if will cover the vaccine, Shringrix

## 2018-01-11 ENCOUNTER — Ambulatory Visit (INDEPENDENT_AMBULATORY_CARE_PROVIDER_SITE_OTHER): Payer: Medicare Other | Admitting: Psychiatry

## 2018-01-11 DIAGNOSIS — F4310 Post-traumatic stress disorder, unspecified: Secondary | ICD-10-CM

## 2018-01-11 DIAGNOSIS — F3130 Bipolar disorder, current episode depressed, mild or moderate severity, unspecified: Secondary | ICD-10-CM

## 2018-01-11 DIAGNOSIS — Z62810 Personal history of physical and sexual abuse in childhood: Secondary | ICD-10-CM

## 2018-01-11 DIAGNOSIS — Z91411 Personal history of adult psychological abuse: Secondary | ICD-10-CM

## 2018-01-11 DIAGNOSIS — Z91410 Personal history of adult physical and sexual abuse: Secondary | ICD-10-CM | POA: Diagnosis not present

## 2018-01-11 DIAGNOSIS — F319 Bipolar disorder, unspecified: Secondary | ICD-10-CM | POA: Diagnosis not present
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
## 2018-01-18 ENCOUNTER — Ambulatory Visit (INDEPENDENT_AMBULATORY_CARE_PROVIDER_SITE_OTHER): Payer: Medicare Other | Admitting: Psychiatry

## 2018-01-18 ENCOUNTER — Telehealth (HOSPITAL_COMMUNITY): Payer: Self-pay | Admitting: *Deleted

## 2018-01-18 DIAGNOSIS — F3130 Bipolar disorder, current episode depressed, mild or moderate severity, unspecified: Secondary | ICD-10-CM

## 2018-01-18 DIAGNOSIS — F4310 Post-traumatic stress disorder, unspecified: Secondary | ICD-10-CM

## 2018-01-18 NOTE — Telephone Encounter (Signed)
Noted. Advise her to stay on abilify and hold Trintellix at this time. Advise her to be seen by PCP if her symptoms worsens despite discontinuation of trintelix.

## 2018-01-18 NOTE — Telephone Encounter (Signed)
Dr Modesta Messing Patient came into office to inform after he visit with Therapy that the Trintellix  for the last week has been causing "sickening to her stomach" causing nausea, vomiting,  no diarrhea, headaches & dizziness . She has stopped taking for fear of the side effects. Pleased the Abilify results.
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
## 2018-01-29 ENCOUNTER — Encounter (HOSPITAL_COMMUNITY): Payer: Self-pay | Admitting: Psychiatry

## 2018-01-29 ENCOUNTER — Ambulatory Visit (INDEPENDENT_AMBULATORY_CARE_PROVIDER_SITE_OTHER): Payer: Medicare Other | Admitting: Psychiatry

## 2018-01-29 VITALS — BP 166/86 | HR 77 | Ht 62.0 in | Wt 179.0 lb

## 2018-01-29 DIAGNOSIS — F4310 Post-traumatic stress disorder, unspecified: Secondary | ICD-10-CM

## 2018-01-29 DIAGNOSIS — F17210 Nicotine dependence, cigarettes, uncomplicated: Secondary | ICD-10-CM

## 2018-01-29 DIAGNOSIS — F3130 Bipolar disorder, current episode depressed, mild or moderate severity, unspecified: Secondary | ICD-10-CM

## 2018-01-29 DIAGNOSIS — Z818 Family history of other mental and behavioral disorders: Secondary | ICD-10-CM | POA: Diagnosis not present

## 2018-01-29 MED ORDER — ARIPIPRAZOLE (SENSOR) 10 MG PO TABS: 10.0000 mg | ORAL_TABLET | Freq: Every day | ORAL | 0 refills | Status: DC

## 2018-01-29 NOTE — Patient Instructions (Signed)
1. Increase Abilify 10 mg daily  2. Discontinue Trintellix 3. Continue melatonin 3 mg one to two hour before going to bed 4. Return to clinic in one month for 30 mins

## 2018-02-02 ENCOUNTER — Encounter: Payer: Self-pay | Admitting: Family Medicine

## 2018-02-02 ENCOUNTER — Other Ambulatory Visit: Payer: Self-pay

## 2018-02-02 ENCOUNTER — Ambulatory Visit (INDEPENDENT_AMBULATORY_CARE_PROVIDER_SITE_OTHER): Payer: Medicare Other | Admitting: Family Medicine

## 2018-02-02 VITALS — BP 168/88 | HR 64 | Temp 98.4°F | Resp 14 | Ht 61.75 in | Wt 181.1 lb

## 2018-02-02 DIAGNOSIS — Z720 Tobacco use: Secondary | ICD-10-CM | POA: Diagnosis not present

## 2018-02-02 DIAGNOSIS — S46812A Strain of other muscles, fascia and tendons at shoulder and upper arm level, left arm, initial encounter: Secondary | ICD-10-CM | POA: Diagnosis not present

## 2018-02-02 DIAGNOSIS — I10 Essential (primary) hypertension: Secondary | ICD-10-CM | POA: Diagnosis not present

## 2018-02-02 MED ORDER — KETOROLAC TROMETHAMINE 60 MG/2ML IM SOLN
60.0000 mg | Freq: Once | INTRAMUSCULAR | Status: AC
  Administered 2018-02-02: 60 mg via INTRAMUSCULAR

## 2018-02-02 MED ORDER — CYCLOBENZAPRINE HCL 10 MG PO TABS: 10.0000 mg | ORAL_TABLET | Freq: Three times a day (TID) | ORAL | 0 refills | Status: DC | PRN

## 2018-02-02 MED ORDER — AMLODIPINE BESYLATE 5 MG PO TABS: 5.0000 mg | ORAL_TABLET | Freq: Every day | ORAL | 3 refills | Status: DC

## 2018-02-02 NOTE — Patient Instructions (Signed)
Heat to the muscle area, 20 minutes, four times a day. You can use the muscle relaxer, but it can make you sleepy. I am giving you a toradol shot, so do take anymore alleve today.
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
## 2018-02-09 ENCOUNTER — Ambulatory Visit (INDEPENDENT_AMBULATORY_CARE_PROVIDER_SITE_OTHER): Payer: Medicare Other | Admitting: Cardiology

## 2018-02-09 ENCOUNTER — Encounter: Payer: Self-pay | Admitting: Cardiology

## 2018-02-09 VITALS — BP 124/78 | HR 80 | Ht 62.0 in | Wt 181.4 lb

## 2018-02-09 DIAGNOSIS — R002 Palpitations: Secondary | ICD-10-CM | POA: Diagnosis not present

## 2018-02-09 DIAGNOSIS — R0789 Other chest pain: Secondary | ICD-10-CM | POA: Diagnosis not present

## 2018-02-09 DIAGNOSIS — I10 Essential (primary) hypertension: Secondary | ICD-10-CM | POA: Diagnosis not present

## 2018-02-09 NOTE — Patient Instructions (Signed)

## 2018-02-09 NOTE — Progress Notes (Signed)
Clinical Summary Ms. Bromell is a 52 y.o.female seen today for follow up of the following medical problems.    1. Chest pain - self reported history of MI in 2014 at [facility]. Had cath, per report she reports there was no significant blockages and was managed medically - no recent chest pain symptoms.   - [facility] notes mention normal cath, anomalous LCX. Thought could be vasospasm, had been managed on norvasc. - no recent chest pain.    2. HTN - compliant with meds  3. Palpitations - feeling of heart fluttering. Can be painful. Occurs daily, lasts just a few seconds - can occur at rest or with exertion. Can feel lightheaded, +SOB - coffee x 2-3, 4-5 mountain dew cans, rare, no EtOH - ongoing for a few years, increase in frequency.  - TSH 1.46,    - 09/2017 holter with isolated PVCs and PACs overall rate.  - has been cutting back on caffeine. Coffee 1-2 cups, sodas 3 per day. - symptoms have improved.  - now off lopressor due to low heart rates, had been on. Bradycardia to 40s in ER.  Past Medical History:  Diagnosis Date  . Anxiety   . Arthritis    "pretty much all over; mainly neck and back" (12/26/2015)  . Asthma   . Bipolar 1 disorder ([place])   . Bipolar disorder ([place])   . Cervical cancer ([place])    "stage I"  . Chronic back pain   . Chronic bronchitis ([place])   . COPD (chronic obstructive pulmonary disease) ([place])   . Degenerative disc disease   . Depression   . Dysrhythmia   . Fibromyalgia   . GERD (gastroesophageal reflux disease)   . Hepatic cyst   . High cholesterol   . History of hiatal hernia   . Hypertension 08/04/2012  . IBS (irritable bowel syndrome) Diagnosed November 2012  . Incisional hernia with gangrene and obstruction 12/26/2015  . Kidney stones    "passed them"  . Melanoma of back ([place])   . Ovarian cancer ([place])    "stage II"  . Plantar fasciitis, bilateral   . Polycystic kidney disease    ruled out  . Sleep apnea    cpap coming   "soon" (12/26/2015)     Allergies  Allergen Reactions  . Barium-Containing Compounds Other (See Comments)    Stomach cramps, extreme diarrhea, and vomiting  . Bee Venom Anaphylaxis  . Flu Virus Vaccine Swelling    Trouble swallowing Trouble swallowing   . Seldane [Terfenadine] Nausea And Vomiting and Other (See Comments)    CAUSES TOP LAYER OF SKIN TO PEEL  . Sulfa Antibiotics Anaphylaxis  . Lisinopril Cough  . Lithium Other (See Comments)    Agitation and hostility  . Tetracyclines & Related Hives and Nausea And Vomiting  . Trazodone And Nefazodone     Sick  . Zinc Nausea And Vomiting  . Aspirin Rash and Other (See Comments)    Upset stomach  . Ativan [Lorazepam] Other (See Comments)    Irritability  . Erythromycin Nausea And Vomiting, Rash and Other (See Comments)    Cramps  . Lipitor [Atorvastatin] Nausea And Vomiting     Current Outpatient Medications  Medication Sig Dispense Refill  . albuterol (PROVENTIL HFA;VENTOLIN HFA) 108 (90 Base) MCG/ACT inhaler Inhale 1-2 puffs into the lungs every 6 (six) hours as needed for wheezing or shortness of breath. 1 Inhaler 1  . amLODipine (NORVASC) 5 MG tablet Take 1  tablet (5 mg total) by mouth daily. 90 tablet 3  . ARIPiprazole 10 MG TABS Take 10 mg by mouth daily. 30 tablet 0  . cloNIDine (CATAPRES - DOSED IN MG/24 HR) 0.3 mg/24hr patch Place 1 patch (0.3 mg total) onto the skin once a week. 4 patch 12  . cyclobenzaprine (FLEXERIL) 10 MG tablet Take 1 tablet (10 mg total) by mouth 3 (three) times daily as needed for muscle spasms. 30 tablet 0  . DULoxetine (CYMBALTA) 30 MG capsule Take 30 mg by mouth daily as needed.     . fluticasone furoate-vilanterol (BREO ELLIPTA) 100-25 MCG/INH AEPB Inhale 1 puff into the lungs daily. 1 each 11  . losartan (COZAAR) 100 MG tablet Take 1 tablet (100 mg total) by mouth daily. 90 tablet 3  . rosuvastatin (CRESTOR) 20 MG tablet Take 1 tablet (20 mg total) by mouth daily. 90 tablet 3  . traMADol  (ULTRAM) 50 MG tablet Take 50-100 mg by mouth every 6 (six) hours as needed for pain.    . varenicline (CHANTIX STARTING MONTH PAK) 0.5 MG X 11 & 1 MG X 42 tablet Take one 0.5 mg tablet by mouth once daily for 3 days, then increase to one 0.5 mg tablet twice daily for 4 days, then increase to one 1 mg tablet twice daily. 53 tablet 0   No current facility-administered medications for this visit.      Past Surgical History:  Procedure Laterality Date  . ABDOMINAL HYSTERECTOMY  1997  . APPENDECTOMY    . CARDIAC CATHETERIZATION  2015  . CARPAL TUNNEL RELEASE Right   . CESAREAN SECTION  1987  . CHOLECYSTECTOMY OPEN  1998  . COLONOSCOPY  November 2012  . EYE SURGERY     CATARACTS REMOVED 09/09/17 and 09/16/17  . HERNIA REPAIR    . INCISIONAL HERNIA REPAIR N/A 12/26/2015   Procedure: LAPAROSCOPIC REPAIR INCISIONAL HERNIA WITH MESH;  Surgeon: Fanny Skates, MD;  Location: [place];  Service: General;  Laterality: N/A;  . INSERTION OF MESH N/A 12/26/2015   Procedure: INSERTION OF MESH;  Surgeon: Fanny Skates, MD;  Location: [place];  Service: General;  Laterality: N/A;  . [place]  . LAPAROSCOPIC INCISIONAL / UMBILICAL / VENTRAL HERNIA REPAIR  12/26/2015   repair of incarcerated incisional hernia with mesh  . LIVER CYST REMOVAL  2000s  . MELANOMA EXCISION  January 2013   removed from back  . SHOULDER SURGERY Left 2010   Humeral Head Microfracture   . TUBAL LIGATION    . UPPER GASTROINTESTINAL ENDOSCOPY  November 2012     Allergies  Allergen Reactions  . Barium-Containing Compounds Other (See Comments)    Stomach cramps, extreme diarrhea, and vomiting  . Bee Venom Anaphylaxis  . Flu Virus Vaccine Swelling    Trouble swallowing Trouble swallowing   . Seldane [Terfenadine] Nausea And Vomiting and Other (See Comments)    CAUSES TOP LAYER OF SKIN TO PEEL  . Sulfa Antibiotics Anaphylaxis  . Lisinopril Cough  . Lithium Other (See Comments)    Agitation and  hostility  . Tetracyclines & Related Hives and Nausea And Vomiting  . Trazodone And Nefazodone     Sick  . Zinc Nausea And Vomiting  . Aspirin Rash and Other (See Comments)    Upset stomach  . Ativan [Lorazepam] Other (See Comments)    Irritability  . Erythromycin Nausea And Vomiting, Rash and Other (See Comments)    Cramps  . Lipitor [Atorvastatin] Nausea  And Vomiting      Family History  Adopted: Yes  Problem Relation Age of Onset  . Bipolar disorder Mother        never diagnosed but patient suspects mother had bipolar disorder.Marland KitchenMarland KitchenMarland KitchenMarland Kitchenpt was adopeted, only knew birth mother for a short period of time.  Marland Kitchen Anxiety disorder Mother   . Cirrhosis Mother   . Diabetes Mother   . Turner syndrome Daughter   . Cancer Daughter   . Bipolar disorder Daughter   . Interstitial cystitis Daughter   . ADD / ADHD Neg Hx   . Alcohol abuse Neg Hx   . Drug abuse Neg Hx   . Dementia Neg Hx   . Depression Neg Hx   . OCD Neg Hx   . Paranoid behavior Neg Hx   . Schizophrenia Neg Hx   . Seizures Neg Hx   . Sexual abuse Neg Hx   . Physical abuse Neg Hx   . Colon cancer Neg Hx      Social History Ms. Basques reports that she has been smoking cigarettes.  She has a 23.00 pack-year smoking history. She has never used smokeless tobacco. Ms. Milillo reports that she does not drink alcohol.   Review of Systems CONSTITUTIONAL: No weight loss, fever, chills, weakness or fatigue.  HEENT: Eyes: No visual loss, blurred vision, double vision or yellow sclerae.No hearing loss, sneezing, congestion, runny nose or sore throat.  SKIN: No rash or itching.  CARDIOVASCULAR: per hpi RESPIRATORY: No shortness of breath, cough or sputum.  GASTROINTESTINAL: No anorexia, nausea, vomiting or diarrhea. No abdominal pain or blood.  GENITOURINARY: No burning on urination, no polyuria NEUROLOGICAL: No headache, dizziness, syncope, paralysis, ataxia, numbness or tingling in the extremities. No change in bowel or bladder  control.  MUSCULOSKELETAL: No muscle, back pain, joint pain or stiffness.  LYMPHATICS: No enlarged nodes. No history of splenectomy.  PSYCHIATRIC: No history of depression or anxiety.  ENDOCRINOLOGIC: No reports of sweating, cold or heat intolerance. No polyuria or polydipsia.  Marland Kitchen   Physical Examination Vitals:   02/09/18 1128  BP: 124/78  Pulse: 80  SpO2: 97%   Filed Weights   02/09/18 1128  Weight: 181 lb 6.4 oz (82.3 kg)    Gen: resting comfortably, no acute distress HEENT: no scleral icterus, pupils equal round and reactive, no palptable cervical adenopathy,  CV: RRR, no m/r/g, no jvd Resp: Clear to auscultation bilaterally GI: abdomen is soft, non-tender, non-distended, normal bowel sounds, no hepatosplenomegaly MSK: extremities are warm, no edema.  Skin: warm, no rash Neuro:  no focal deficits Psych: appropriate affect   Diagnostic Studies  09/2017 holter  48 hr holter monitor  Rare ventricular ectopy, all in the form of isolated PVCs  Rare supraventricular ectopy, primarily in the form of PACs.  Min HR 45, Max HR 121, Avg HR 86  No diary submitted  No significant arrhythmias   Assessment and Plan  1. Chest pain - no recent symptoms, prior history as reported above - continue medical therapy.   2. HTN - bp at goal, continue current meds  3. Palpitations - prior monitor with just PACs and PVCs - bradycardia on av nodal agents previously - improved with weaning of caffeine, continue to monitor.   F/u 1 year    Arnoldo Lenis, M.D.

## 2018-02-10 ENCOUNTER — Encounter (HOSPITAL_COMMUNITY): Payer: Self-pay | Admitting: Psychiatry

## 2018-02-10 ENCOUNTER — Ambulatory Visit (INDEPENDENT_AMBULATORY_CARE_PROVIDER_SITE_OTHER): Payer: Medicare Other | Admitting: Psychiatry

## 2018-02-10 DIAGNOSIS — F4310 Post-traumatic stress disorder, unspecified: Secondary | ICD-10-CM

## 2018-02-10 DIAGNOSIS — F3130 Bipolar disorder, current episode depressed, mild or moderate severity, unspecified: Secondary | ICD-10-CM | POA: Diagnosis not present
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
## 2018-02-15 ENCOUNTER — Encounter: Payer: Self-pay | Admitting: Gastroenterology

## 2018-02-15 ENCOUNTER — Ambulatory Visit (INDEPENDENT_AMBULATORY_CARE_PROVIDER_SITE_OTHER): Payer: Medicare Other | Admitting: Gastroenterology

## 2018-02-15 VITALS — BP 144/84 | HR 73 | Temp 98.1°F | Ht 62.0 in | Wt 180.0 lb

## 2018-02-15 DIAGNOSIS — Z1211 Encounter for screening for malignant neoplasm of colon: Secondary | ICD-10-CM

## 2018-02-15 DIAGNOSIS — K582 Mixed irritable bowel syndrome: Secondary | ICD-10-CM

## 2018-02-15 DIAGNOSIS — K7689 Other specified diseases of liver: Secondary | ICD-10-CM

## 2018-02-15 HISTORY — DX: Encounter for screening for malignant neoplasm of colon: Z12.11

## 2018-02-15 NOTE — Progress Notes (Signed)
Primary Care Physician: Caren Macadam, MD  Primary Gastroenterologist:  Barney Drain, MD   Chief Complaint  Patient presents with  . hepatic cyst    saw surgeon and is waiting until Nov to see if it has grew in size    HPI: [name] is a 52 y.o. female here for follow-up of enlarging hepatic associated with RUQ pain.  She was seen back in January.  She has a history of laparoscopic fenestration of a symptomatic large right hepatic cyst in February 2014.  Symptoms resolved postoperatively.  Current cyst in segment 4 of the liver has been present on CT imaging since 2013 but has been growing in size, currently approximately 7 cm (previously 4.6 X 3.9cm in 2013).  Patient's current symptoms reminiscence of what she experienced with her previous large hepatic cyst.  She was sent back to Dr. Crisoforo Oxford whom she saw in February 2019.  There are plans for repeat CT imaging in November.  They are allowing cyst to enlarge and become more superficial in the liver prior to fenestration.  Clinically she is doing okay.  She has intermittent right upper quadrant pain, feels like something is going to pop, noted mostly when she bends over or palpates the area.  She has IBS D chronically, has had some intermittent constipation for the past several months but maintains predominance of diarrhea.  She takes Imodium 2 mg every morning, typically has 3 soft to loose stools daily.  Denies significant abdominal cramping.  No blood in the stool or melena.  We discussed that her last colonoscopy was in 2012, she had poor prep.  She reports she had vomiting with bowel prep at the time.  Recently submitted I FOBT to [place] [facility], results pending.  She wants to wait to these results are available.  We discussed that she could have a colonoscopy at any time given the fact that her prep was poor before.  We discussed new prep options including split prep and newer preps available.  Again she would like to hold off  until her stool results are available.    Current Outpatient Medications  Medication Sig Dispense Refill  . albuterol (PROVENTIL HFA;VENTOLIN HFA) 108 (90 Base) MCG/ACT inhaler Inhale 1-2 puffs into the lungs every 6 (six) hours as needed for wheezing or shortness of breath. 1 Inhaler 1  . amLODipine (NORVASC) 5 MG tablet Take 1 tablet (5 mg total) by mouth daily. 90 tablet 3  . ARIPiprazole 10 MG TABS Take 10 mg by mouth daily. 30 tablet 0  . cloNIDine (CATAPRES - DOSED IN MG/24 HR) 0.3 mg/24hr patch Place 1 patch (0.3 mg total) onto the skin once a week. 4 patch 12  . cyclobenzaprine (FLEXERIL) 10 MG tablet Take 1 tablet (10 mg total) by mouth 3 (three) times daily as needed for muscle spasms. 30 tablet 0  . DULoxetine (CYMBALTA) 30 MG capsule Take 30 mg by mouth daily as needed.     . fluticasone furoate-vilanterol (BREO ELLIPTA) 100-25 MCG/INH AEPB Inhale 1 puff into the lungs daily. 1 each 11  . losartan (COZAAR) 100 MG tablet Take 1 tablet (100 mg total) by mouth daily. 90 tablet 3  . rosuvastatin (CRESTOR) 20 MG tablet Take 1 tablet (20 mg total) by mouth daily. 90 tablet 3  . traMADol (ULTRAM) 50 MG tablet Take 50-100 mg by mouth every 6 (six) hours as needed for pain.    . varenicline (CHANTIX STARTING MONTH PAK) 0.5 MG  X 11 & 1 MG X 42 tablet Take one 0.5 mg tablet by mouth once daily for 3 days, then increase to one 0.5 mg tablet twice daily for 4 days, then increase to one 1 mg tablet twice daily. 53 tablet 0   No current facility-administered medications for this visit.     Allergies as of 02/15/2018 - Review Complete 02/15/2018  Allergen Reaction Noted  . Barium-containing compounds Other (See Comments) 08/11/2012  . Bee venom Anaphylaxis 09/30/2011  . Flu virus vaccine Swelling 09/25/2015  . Seldane [terfenadine] Nausea And Vomiting and Other (See Comments) 05/26/2011  . Sulfa antibiotics Anaphylaxis 05/26/2011  . Lisinopril Cough 10/22/2015  . Lithium Other (See Comments)  06/30/2012  . Tetracyclines & related Hives and Nausea And Vomiting 05/26/2011  . Trazodone and nefazodone  11/25/2017  . Zinc Nausea And Vomiting 09/25/2015  . Aspirin Rash and Other (See Comments) 05/26/2011  . Ativan [lorazepam] Other (See Comments) 12/18/2015  . Erythromycin Nausea And Vomiting, Rash, and Other (See Comments) 05/26/2011  . Lipitor [atorvastatin] Nausea And Vomiting 12/18/2015    ROS:  General: Negative for anorexia, weight loss, fever, chills, fatigue, weakness. ENT: Negative for hoarseness, difficulty swallowing , nasal congestion. CV: Negative for chest pain, angina, palpitations, dyspnea on exertion, peripheral edema.  Respiratory: Negative for dyspnea at rest, dyspnea on exertion, cough, sputum, wheezing.  GI: See history of present illness. GU:  Negative for dysuria, hematuria, urinary incontinence, urinary frequency, nocturnal urination.  Endo: Negative for unusual weight change.    Physical Examination:   BP (!) 144/84   Pulse 73   Temp 98.1 F (36.7 C) (Oral)   Ht 5\' 2"  (1.575 m)   Wt 180 lb (81.6 kg)   BMI 32.92 kg/m   General: Chronically ill-appearing female in no acute distress.  Eyes: No icterus. Mouth: Oropharyngeal mucosa moist and pink , no lesions erythema or exudate. Lungs: Clear to auscultation bilaterally.  Heart: Regular rate and rhythm, no murmurs rubs or gallops.  Abdomen: Bowel sounds are normal, moderate right upper quadrant tenderness, nondistended, no hepatosplenomegaly or masses, no abdominal bruits or hernia , no rebound or guarding.   Extremities: No lower extremity edema. No clubbing or deformities. Neuro: Alert and oriented x 4   Skin: Warm and dry, no jaundice.   Psych: Alert and cooperative, normal mood and affect.  Labs:  Lab Results  Component Value Date   CREATININE 0.80 12/08/2017   BUN 9 12/08/2017   NA 140 12/08/2017   K 3.8 12/08/2017   CL 104 12/08/2017   CO2 28 12/08/2017   Lab Results  Component  Value Date   ALT 19 12/08/2017   AST 16 12/08/2017   ALKPHOS 82 12/08/2017   BILITOT 1.1 12/08/2017   Lab Results  Component Value Date   WBC 11.2 (H) 12/08/2017   HGB 14.5 12/08/2017   HCT 45.1 12/08/2017   MCV 91.9 12/08/2017   PLT 333 12/08/2017    Imaging Studies: No results found.

## 2018-02-15 NOTE — Assessment & Plan Note (Signed)
Previous colonoscopy in 2012, poor prep.  She submitted recent I FOBT to [place] [facility].  She will let us know what the results are.  I did discuss that she could pursue colonoscopy at any time given previous colonoscopy had poor prep.  We discussed new prep options and split prep options.  She will let us know when she is ready to schedule colonoscopy.

## 2018-02-15 NOTE — Assessment & Plan Note (Signed)
She will continue to follow with Dr. Jyl Heinz.  Due for repeat imaging in November with office visit with him.  She will keep [place] posted on recommendations.  Office visit here as needed only.

## 2018-02-15 NOTE — Patient Instructions (Addendum)
1. Please call when you get your stool result back so we can document it in our records.  2. You should consider updating your colonoscopy given poor prep in 2012 as small polyps could have been missed. Let me know when you are ready! 3. We will await further correspondence from Dr. Crisoforo Oxford after you have repeat imaging in 05/2018.  4. Return to the office as needed.

## 2018-02-15 NOTE — Assessment & Plan Note (Signed)
Predominantly diarrhea although does alternate with constipation at times.  Doing fairly well on Imodium 2 mg daily.  She is not a candidate for Viberzi given previous cholecystectomy.  We discussed possibility of Bentyl but at this time she is doing well and does not want to make any changes.

## 2018-02-15 NOTE — Progress Notes (Signed)
CC'D TO PCP °

## 2018-02-18 ENCOUNTER — Telehealth: Payer: Self-pay | Admitting: Family Medicine

## 2018-02-18 ENCOUNTER — Ambulatory Visit (HOSPITAL_COMMUNITY): Payer: Medicare Other | Admitting: Psychiatry

## 2018-02-18 NOTE — Telephone Encounter (Signed)
Pt LVM that she needs to talk to the Nurse.Marland Kitchen Please call the PT

## 2018-02-19 ENCOUNTER — Telehealth: Payer: Self-pay | Admitting: Family Medicine

## 2018-02-19 MED ORDER — CLONIDINE HCL 0.2 MG PO TABS: 0.2000 mg | ORAL_TABLET | Freq: Two times a day (BID) | ORAL | 0 refills | Status: DC

## 2018-02-19 NOTE — Telephone Encounter (Signed)
I received message regarding patient's reaction to the clonidine patch.  Please call and advise to discontinue the clonidine patch.  I will place her on clonidine 0.2 mg tablets.  Take 1 pill every 12 hours as directed for blood pressure.  Continue to monitor blood pressure and let us know how she is doing.  If she is having any side effects or problems platelets let us know and if she has any trouble over the weekend please go to the urgent care clinic.  Please advised to schedule follow-up visit in our office in the next 2 weeks and let us know how her pressure is running.

## 2018-02-19 NOTE — Telephone Encounter (Signed)
States she is having a problem with itching and irritation with the clonidine patch and she had to stop using it yesterday and her BP is 158/92 today. She is still on all her other medications. Wants to know what you want to do? Call back # [phone]

## 2018-02-19 NOTE — Telephone Encounter (Signed)
Pt aware.
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
## 2018-02-24 ENCOUNTER — Ambulatory Visit (INDEPENDENT_AMBULATORY_CARE_PROVIDER_SITE_OTHER): Payer: Medicare Other | Admitting: Psychiatry

## 2018-02-24 DIAGNOSIS — F4310 Post-traumatic stress disorder, unspecified: Secondary | ICD-10-CM | POA: Diagnosis not present
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
## 2018-03-02 ENCOUNTER — Ambulatory Visit (INDEPENDENT_AMBULATORY_CARE_PROVIDER_SITE_OTHER): Payer: Medicare Other | Admitting: Psychiatry

## 2018-03-02 ENCOUNTER — Encounter (HOSPITAL_COMMUNITY): Payer: Self-pay | Admitting: Psychiatry

## 2018-03-02 VITALS — BP 132/82 | HR 90 | Ht 62.0 in | Wt 181.0 lb

## 2018-03-02 DIAGNOSIS — Z818 Family history of other mental and behavioral disorders: Secondary | ICD-10-CM

## 2018-03-02 DIAGNOSIS — F4310 Post-traumatic stress disorder, unspecified: Secondary | ICD-10-CM | POA: Diagnosis not present

## 2018-03-02 DIAGNOSIS — F3130 Bipolar disorder, current episode depressed, mild or moderate severity, unspecified: Secondary | ICD-10-CM | POA: Diagnosis not present

## 2018-03-02 DIAGNOSIS — R450 Nervousness: Secondary | ICD-10-CM

## 2018-03-02 MED ORDER — ARIPIPRAZOLE 15 MG PO TABS: 15.0000 mg | ORAL_TABLET | Freq: Every day | ORAL | 0 refills | Status: DC

## 2018-03-02 NOTE — Patient Instructions (Signed)
1. Increase Abilify 15 mg daily  2. Return to clinic in one month for 30 mins

## 2018-03-05 ENCOUNTER — Ambulatory Visit (INDEPENDENT_AMBULATORY_CARE_PROVIDER_SITE_OTHER): Payer: Medicare Other | Admitting: Psychiatry

## 2018-03-05 DIAGNOSIS — F4310 Post-traumatic stress disorder, unspecified: Secondary | ICD-10-CM
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
## 2018-03-09 ENCOUNTER — Encounter: Payer: Self-pay | Admitting: Family Medicine

## 2018-03-09 ENCOUNTER — Other Ambulatory Visit: Payer: Self-pay

## 2018-03-09 ENCOUNTER — Ambulatory Visit (INDEPENDENT_AMBULATORY_CARE_PROVIDER_SITE_OTHER): Payer: Medicare Other | Admitting: Family Medicine

## 2018-03-09 VITALS — BP 140/76 | HR 71 | Temp 98.1°F | Resp 12 | Ht 62.0 in | Wt 180.1 lb

## 2018-03-09 DIAGNOSIS — M797 Fibromyalgia: Secondary | ICD-10-CM

## 2018-03-09 DIAGNOSIS — I10 Essential (primary) hypertension: Secondary | ICD-10-CM

## 2018-03-09 MED ORDER — AMLODIPINE BESYLATE 10 MG PO TABS: 10.0000 mg | ORAL_TABLET | Freq: Every day | ORAL | 3 refills | Status: DC

## 2018-03-09 MED ORDER — DULOXETINE HCL 30 MG PO CPEP: 30.0000 mg | ORAL_CAPSULE | Freq: Every day | ORAL | 3 refills | Status: DC | PRN
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
## 2018-03-11 ENCOUNTER — Ambulatory Visit (HOSPITAL_COMMUNITY): Payer: Medicare Other | Admitting: Psychiatry
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
## 2018-04-07 ENCOUNTER — Ambulatory Visit (INDEPENDENT_AMBULATORY_CARE_PROVIDER_SITE_OTHER): Payer: Medicare Other | Admitting: Psychiatry

## 2018-04-07 VITALS — BP 162/92 | HR 75 | Ht 62.0 in | Wt 185.0 lb

## 2018-04-07 DIAGNOSIS — Z818 Family history of other mental and behavioral disorders: Secondary | ICD-10-CM

## 2018-04-07 DIAGNOSIS — F319 Bipolar disorder, unspecified: Secondary | ICD-10-CM | POA: Diagnosis not present

## 2018-04-07 DIAGNOSIS — F419 Anxiety disorder, unspecified: Secondary | ICD-10-CM

## 2018-04-07 DIAGNOSIS — F17210 Nicotine dependence, cigarettes, uncomplicated: Secondary | ICD-10-CM | POA: Diagnosis not present

## 2018-04-07 DIAGNOSIS — G4700 Insomnia, unspecified: Secondary | ICD-10-CM

## 2018-04-07 DIAGNOSIS — F4310 Post-traumatic stress disorder, unspecified: Secondary | ICD-10-CM

## 2018-04-07 MED ORDER — LURASIDONE HCL 20 MG PO TABS: 20.0000 mg | ORAL_TABLET | Freq: Every day | ORAL | 0 refills | Status: DC

## 2018-04-07 NOTE — Patient Instructions (Signed)
1. Discontinue Abilify  2. Start latuda 20 mg daily  3. Return to clinic in one month for 15 mins

## 2018-04-09 ENCOUNTER — Encounter (HOSPITAL_COMMUNITY): Payer: Self-pay | Admitting: *Deleted

## 2018-04-09 ENCOUNTER — Emergency Department (HOSPITAL_COMMUNITY)
Admission: EM | Admit: 2018-04-09 | Discharge: 2018-04-09 | Disposition: A | Discharge status: Home / Self Care | Payer: Medicare Other | Attending: Emergency Medicine | Admitting: Emergency Medicine

## 2018-04-09 ENCOUNTER — Other Ambulatory Visit: Payer: Self-pay

## 2018-04-09 ENCOUNTER — Encounter: Payer: Self-pay | Admitting: Family Medicine

## 2018-04-09 ENCOUNTER — Emergency Department (HOSPITAL_COMMUNITY): Payer: Medicare Other

## 2018-04-09 ENCOUNTER — Ambulatory Visit (INDEPENDENT_AMBULATORY_CARE_PROVIDER_SITE_OTHER): Payer: Medicare Other | Admitting: Family Medicine

## 2018-04-09 VITALS — BP 154/78 | HR 83 | Temp 98.6°F | Resp 12 | Ht 62.0 in | Wt 187.1 lb

## 2018-04-09 DIAGNOSIS — Z79899 Other long term (current) drug therapy: Secondary | ICD-10-CM | POA: Diagnosis not present

## 2018-04-09 DIAGNOSIS — R32 Unspecified urinary incontinence: Secondary | ICD-10-CM

## 2018-04-09 DIAGNOSIS — M5432 Sciatica, left side: Secondary | ICD-10-CM | POA: Diagnosis not present

## 2018-04-09 DIAGNOSIS — I10 Essential (primary) hypertension: Secondary | ICD-10-CM | POA: Diagnosis not present

## 2018-04-09 DIAGNOSIS — F17210 Nicotine dependence, cigarettes, uncomplicated: Secondary | ICD-10-CM | POA: Diagnosis not present

## 2018-04-09 DIAGNOSIS — N3941 Urge incontinence: Secondary | ICD-10-CM | POA: Diagnosis not present

## 2018-04-09 DIAGNOSIS — M545 Low back pain: Secondary | ICD-10-CM | POA: Diagnosis present

## 2018-04-09 DIAGNOSIS — M5431 Sciatica, right side: Secondary | ICD-10-CM | POA: Diagnosis not present

## 2018-04-09 DIAGNOSIS — R350 Frequency of micturition: Secondary | ICD-10-CM

## 2018-04-09 DIAGNOSIS — J45909 Unspecified asthma, uncomplicated: Secondary | ICD-10-CM | POA: Insufficient documentation

## 2018-04-09 DIAGNOSIS — I2510 Atherosclerotic heart disease of native coronary artery without angina pectoris: Secondary | ICD-10-CM | POA: Insufficient documentation

## 2018-04-09 DIAGNOSIS — M5441 Lumbago with sciatica, right side: Secondary | ICD-10-CM

## 2018-04-09 LAB — URINALYSIS, ROUTINE W REFLEX MICROSCOPIC
BILIRUBIN URINE: NEGATIVE
GLUCOSE, UA: NEGATIVE mg/dL
HGB URINE DIPSTICK: NEGATIVE
KETONES UR: NEGATIVE mg/dL
Leukocytes, UA: NEGATIVE
Nitrite: NEGATIVE
PROTEIN: NEGATIVE mg/dL
Specific Gravity, Urine: 1.013 (ref 1.005–1.030)
pH: 6 (ref 5.0–8.0)

## 2018-04-09 MED ORDER — METHOCARBAMOL 500 MG PO TABS
500.0000 mg | ORAL_TABLET | Freq: Once | ORAL | Status: AC
  Administered 2018-04-09: 500 mg via ORAL
  Filled 2018-04-09: qty 1

## 2018-04-09 MED ORDER — LIDOCAINE 5 % EX PTCH: 1.0000 | MEDICATED_PATCH | CUTANEOUS | 0 refills | Status: DC

## 2018-04-09 MED ORDER — OXYCODONE-ACETAMINOPHEN 5-325 MG PO TABS
1.0000 | ORAL_TABLET | Freq: Once | ORAL | Status: AC
  Administered 2018-04-09: 1 via ORAL
  Filled 2018-04-09: qty 1

## 2018-04-09 MED ORDER — METHOCARBAMOL 500 MG PO TABS: 500.0000 mg | ORAL_TABLET | Freq: Two times a day (BID) | ORAL | 0 refills | Status: DC

## 2018-04-09 MED ORDER — OXYCODONE-ACETAMINOPHEN 5-325 MG PO TABS: 1.0000 | ORAL_TABLET | Freq: Three times a day (TID) | ORAL | 0 refills | Status: DC | PRN

## 2018-04-09 NOTE — ED Notes (Signed)
Returned  From MRI
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
## 2018-04-09 NOTE — ED Triage Notes (Signed)
Pt in c/o lower back pain and incontinence for the last several months, pt delayed seeking treatment hoping it would resolve on its own, pt reports she had some incontinence issues prior to pain but it has worsened, occurs a few times a week, pt denies specific injury to her back when this started

## 2018-04-09 NOTE — ED Notes (Signed)
PT is in MRI at this time. Gregary Signs (RN) called MRI and asked to bring pt to bed when finished.

## 2018-04-09 NOTE — ED Provider Notes (Signed)
[facility] EMERGENCY DEPARTMENT Provider Note   CSN: [id number] Arrival date & time: 04/09/18  1051     History   Chief Complaint Chief Complaint  Patient presents with  . Back Pain    HPI [name] is a 52 y.o. female with a past medical history of COPD, degenerative disc disease, arthritis, hypertension, ovarian cancer and cervical cancer status post total hysterectomy and oophorectomy in 1997, who presents to ED for evaluation of worsening lower back pain and urinary incontinence for the past 2 months.  States that she has had intermittent back pain for the past 8 years and has had to use a cane to ambulate.  Her symptoms got worse 2 months ago and have not improved with use of ibuprofen.  States that the pain is sharp, shooting and sometimes radiates down bilateral legs.  In regards to her incontinence, she states that "I will not feel like I have to use the bathroom, and the next thing I know I have peed on myself."  States that this happens "all the time."  She is not taking any medicine in the past that is help with her symptoms.  Denies any prior back surgery or evaluation by neurosurgeon in the past.  She cannot recall an incident 2 months ago that exacerbated the pain.  She denies any dysuria, hematuria, history of kidney stones, changes to gait, shortness of breath, fever, history of IV drug use.  HPI  Past Medical History:  Diagnosis Date  . Anxiety   . Arthritis    "pretty much all over; mainly neck and back" (12/26/2015)  . Asthma   . Bipolar 1 disorder ([place])   . Bipolar disorder ([place])   . Cervical cancer ([place])    "stage I"  . Chronic back pain   . Chronic bronchitis ([place])   . COPD (chronic obstructive pulmonary disease) ([place])   . Degenerative disc disease   . Depression   . Dysrhythmia   . Fibromyalgia   . GERD (gastroesophageal reflux disease)   . Hepatic cyst   . High cholesterol   . History of hiatal hernia   . Hypertension 08/04/2012    . IBS (irritable bowel syndrome) Diagnosed November 2012  . Incisional hernia with gangrene and obstruction 12/26/2015  . Kidney stones    "passed them"  . Melanoma of back ([place])   . Ovarian cancer ([place])    "stage II"  . Plantar fasciitis, bilateral   . Polycystic kidney disease    ruled out  . Sleep apnea    cpap coming  "soon" (12/26/2015)    Patient Active Problem List   Diagnosis Date Noted  . Colon cancer screening 02/15/2018  . Bipolar 1 disorder, mixed, moderate ([place]) 12/25/2017  . Prediabetes 10/01/2017  . Coronary artery disease 10/01/2017  . Hepatic cyst 08/18/2017  . Abdominal pain 08/18/2017  . Fibromyalgia 04/16/2017  . Sacroiliac joint pain 04/16/2017  . Incisional hernia with gangrene and obstruction 12/26/2015  . Incisional hernia with obstruction 12/26/2015  . OSA (obstructive sleep apnea) 12/13/2015  . Irritable bowel syndrome 09/25/2015  . Risk for falls 09/25/2015  . Insomnia 08/14/2015  . Hypertensive urgency 03/19/2015  . Unsteady gait 03/19/2015  . PTSD (post-traumatic stress disorder) 01/31/2014  . Chronic bilateral low back pain 08/18/2013  . DDD (degenerative disc disease), cervical 08/18/2013  . DDD (degenerative disc disease), lumbosacral 08/18/2013  . Myalgia and myositis 08/18/2013  . Osteoarthritis 09/14/2012  . Asthma 09/14/2012  . Chronic  obstructive pulmonary disease ([place]) 09/14/2012  . Gastro-esophageal reflux disease without esophagitis 09/14/2012  . Essential hypertension 09/14/2012  . Tobacco use 09/14/2012  . Insomnia due to mental disorder 05/28/2012    Past Surgical History:  Procedure Laterality Date  . ABDOMINAL HYSTERECTOMY  1997  . APPENDECTOMY    . CARDIAC CATHETERIZATION  2015  . CARPAL TUNNEL RELEASE Right   . CESAREAN SECTION  1987  . CHOLECYSTECTOMY OPEN  1998  . COLONOSCOPY  November 2012  . EYE SURGERY     CATARACTS REMOVED 09/09/17 and 09/16/17  . HERNIA REPAIR    . INCISIONAL HERNIA REPAIR N/A 12/26/2015    Procedure: LAPAROSCOPIC REPAIR INCISIONAL HERNIA WITH MESH;  Surgeon: Fanny Skates, MD;  Location: [facility];  Service: General;  Laterality: N/A;  . INSERTION OF MESH N/A 12/26/2015   Procedure: INSERTION OF MESH;  Surgeon: Fanny Skates, MD;  Location: [facility];  Service: General;  Laterality: N/A;  . [place]  . LAPAROSCOPIC INCISIONAL / UMBILICAL / VENTRAL HERNIA REPAIR  12/26/2015   repair of incarcerated incisional hernia with mesh  . LIVER CYST REMOVAL  2014  . MELANOMA EXCISION  January 2013   removed from back  . SHOULDER SURGERY Left 2010   Humeral Head Microfracture   . TUBAL LIGATION    . UPPER GASTROINTESTINAL ENDOSCOPY  November 2012     OB History    Gravida  6   Para  2   Term  1   Preterm  1   AB  4   Living  2     SAB  4   TAB      Ectopic      Multiple      Live Births  2            Home Medications    Prior to Admission medications   Medication Sig Start Date End Date Taking? Authorizing Provider  albuterol (PROVENTIL HFA;VENTOLIN HFA) 108 (90 Base) MCG/ACT inhaler Inhale 1-2 puffs into the lungs every 6 (six) hours as needed for wheezing or shortness of breath. 12/22/17  Yes Hagler, Apolonio Schneiders, MD  amLODipine (NORVASC) 10 MG tablet Take 1 tablet (10 mg total) by mouth daily. 03/09/18  Yes Hagler, Apolonio Schneiders, MD  fluticasone furoate-vilanterol (BREO ELLIPTA) 100-25 MCG/INH AEPB Inhale 1 puff into the lungs daily. Patient taking differently: Inhale 1 puff into the lungs daily as needed (shortness of breath).  11/04/17  Yes Hagler, Apolonio Schneiders, MD  losartan (COZAAR) 100 MG tablet Take 1 tablet (100 mg total) by mouth daily. 11/04/17  Yes Hagler, Apolonio Schneiders, MD  lurasidone (LATUDA) 20 MG TABS tablet Take 1 tablet (20 mg total) by mouth daily. 04/07/18  Yes Norman Clay, MD  naproxen sodium (ALEVE) 220 MG tablet Take 440 mg by mouth daily as needed (pain).   Yes [provider]  rosuvastatin (CRESTOR) 20 MG tablet Take 1 tablet (20  mg total) by mouth daily. 11/04/17  Yes Caren Macadam, MD  cyclobenzaprine (FLEXERIL) 10 MG tablet Take 1 tablet (10 mg total) by mouth 3 (three) times daily as needed for muscle spasms. Patient not taking: Reported on 04/09/2018 02/02/18   Caren Macadam, MD  DULoxetine (CYMBALTA) 30 MG capsule Take 1 capsule (30 mg total) by mouth daily as needed. Patient not taking: Reported on 04/09/2018 03/09/18   Caren Macadam, MD  lidocaine (LIDODERM) 5 % Place 1 patch onto the skin daily. Remove & Discard patch within 12 hours or as directed by  MD 04/09/18   Delia Heady, PA-C  methocarbamol (ROBAXIN) 500 MG tablet Take 1 tablet (500 mg total) by mouth 2 (two) times daily. 04/09/18   Latavia Goga, PA-C  oxyCODONE-acetaminophen (PERCOCET/ROXICET) 5-325 MG tablet Take 1 tablet by mouth every 8 (eight) hours as needed for severe pain. 04/09/18   Emmalea Treanor, PA-C  varenicline (CHANTIX STARTING MONTH PAK) 0.5 MG X 11 & 1 MG X 42 tablet Take one 0.5 mg tablet by mouth once daily for 3 days, then increase to one 0.5 mg tablet twice daily for 4 days, then increase to one 1 mg tablet twice daily. Patient not taking: Reported on 04/09/2018 12/29/17   Caren Macadam, MD    Family History Family History  Adopted: Yes  Problem Relation Age of Onset  . Bipolar disorder Mother        never diagnosed but patient suspects mother had bipolar disorder.Marland KitchenMarland KitchenMarland KitchenMarland Kitchenpt was adopeted, only knew birth mother for a short period of time.  Marland Kitchen Anxiety disorder Mother   . Cirrhosis Mother   . Diabetes Mother   . Turner syndrome Daughter   . Cancer Daughter   . Bipolar disorder Daughter   . Interstitial cystitis Daughter   . ADD / ADHD Neg Hx   . Alcohol abuse Neg Hx   . Drug abuse Neg Hx   . Dementia Neg Hx   . Depression Neg Hx   . OCD Neg Hx   . Paranoid behavior Neg Hx   . Schizophrenia Neg Hx   . Seizures Neg Hx   . Sexual abuse Neg Hx   . Physical abuse Neg Hx   . Colon cancer Neg Hx     Social History Social History    Tobacco Use  . Smoking status: Current Every Day Smoker    Packs/day: 0.50    Years: 46.00    Pack years: 23.00    Types: Cigarettes  . Smokeless tobacco: Never Used  . Tobacco comment: in process of quitting, using chantix  Substance Use Topics  . Alcohol use: No    Comment: 12/26/2015 'quit in ~ 1997"  . Drug use: No    Comment: 12/26/2015 "quit in ~  2010"     Allergies   Barium-containing compounds; Bee venom; Flu virus vaccine; Seldane [terfenadine]; Sulfa antibiotics; Lisinopril; Lithium; Tetracyclines & related; Trazodone and nefazodone; Zinc; Aspirin; Ativan [lorazepam]; Erythromycin; and Lipitor [atorvastatin]   Review of Systems Review of Systems  Constitutional: Negative for appetite change, chills and fever.  HENT: Negative for ear pain, rhinorrhea, sneezing and sore throat.   Eyes: Negative for photophobia and visual disturbance.  Respiratory: Negative for cough, chest tightness, shortness of breath and wheezing.   Cardiovascular: Negative for chest pain and palpitations.  Gastrointestinal: Negative for abdominal pain, blood in stool, constipation, diarrhea, nausea and vomiting.  Genitourinary: Negative for dysuria, hematuria and urgency.       +urinary incontinence  Musculoskeletal: Positive for back pain. Negative for myalgias.  Skin: Negative for rash.  Neurological: Positive for numbness. Negative for dizziness, weakness and light-headedness.     Physical Exam Updated Vital Signs BP 131/65 (BP Location: Left Arm)   Pulse 65   Temp 98 F (36.7 C) (Oral)   Resp 18   SpO2 97%   Physical Exam  Constitutional: She appears well-developed and well-nourished. No distress.  Nontoxic appearing and in no acute distress. Ambulatory with normal gait.  HENT:  Head: Normocephalic and atraumatic.  Nose: Nose normal.  Eyes: Conjunctivae and EOM are  normal. Right eye exhibits no discharge. Left eye exhibits no discharge. No scleral icterus.  Neck: Normal range of  motion. Neck supple.  Cardiovascular: Normal rate, regular rhythm, normal heart sounds and intact distal pulses. Exam reveals no gallop and no friction rub.  No murmur heard. Pulmonary/Chest: Effort normal and breath sounds normal. No respiratory distress.  Abdominal: Soft. Bowel sounds are normal. She exhibits no distension. There is no tenderness. There is no guarding.  Musculoskeletal: Normal range of motion. She exhibits no edema.       Back:  Tenderness to palpation of the lumbar spine at the midline and right paraspinal musculature.  No midline spinal tenderness present in thoracic or cervical spine. No step-off palpated. No visible bruising, edema or temperature change noted. No objective signs of numbness present. No saddle anesthesia. 2+ DP pulses bilaterally. Sensation intact to light touch. Strength 5/5 in bilateral lower extremities.  Neurological: She is alert. She exhibits normal muscle tone. Coordination normal.  Skin: Skin is warm and dry. No rash noted.  Psychiatric: She has a normal mood and affect.  Nursing note and vitals reviewed.    ED Treatments / Results  Labs (all labs ordered are listed, but only abnormal results are displayed) Labs Reviewed  URINE CULTURE  URINALYSIS, ROUTINE W REFLEX MICROSCOPIC    EKG None  Radiology Mr Lumbar Spine Wo Contrast  Result Date: 04/09/2018 CLINICAL DATA:  Low back pain, minor trauma EXAM: MRI LUMBAR SPINE WITHOUT CONTRAST TECHNIQUE: Multiplanar, multisequence MR imaging of the lumbar spine was performed. No intravenous contrast was administered. COMPARISON:  MRI lumbar spine 05/19/2016 FINDINGS: Segmentation:  Normal Alignment:  Normal Vertebrae:  Negative for fracture or mass.  No bone marrow edema. Conus medullaris and cauda equina: Conus extends to the L1-2 level. Conus and cauda equina appear normal. Paraspinal and other soft tissues: 2 cm left renal cyst. No paraspinous mass or fluid collection. Disc levels: Negative for  spinal stenosis. Generous size spinal canal. Negative for disc protrusion. Mild facet degeneration at L4-5 and L5-S1 bilaterally. IMPRESSION: No acute abnormality and no change from the prior MRI. Mild facet degeneration at L4-5 and L5-S1. Electronically Signed   By: Franchot Gallo M.D.   On: 04/09/2018 13:20    Procedures Procedures (including critical care time)  Medications Ordered in ED Medications  oxyCODONE-acetaminophen (PERCOCET/ROXICET) 5-325 MG per tablet 1 tablet (1 tablet Oral Given 04/09/18 1351)  methocarbamol (ROBAXIN) tablet 500 mg (500 mg Oral Given 04/09/18 1443)     Initial Impression / Assessment and Plan / ED Course  I have reviewed the triage vital signs and the nursing notes.  Pertinent labs & imaging results that were available during my care of the patient were reviewed by me and considered in my medical decision making (see chart for details).     52 year old female with a past medical history of COPD, degenerative disc disease, arthritis, hypertension presents to ED for evaluation of worsening lower back pain and urinary incontinence for the past 2 months.  She has had intermittent back pain for the past 8 years and has had to ambulate with a cane as a result of it.  In regards to her incontinence, she states that " "I will not feel like I have to use the bathroom, and the next thing I know I have peed on myself."  States that the back pain is sharp and radiates down both of her legs.  PCP sent her here for MRI of her lumbar spine.  She  denies any prior back surgeries or evaluation by neurosurgeon in the past.  She cannot recall any incident 2 months ago that exacerbated the pain.  She denies any other urinary symptoms, fever, changes to gait, numbness in legs, history of IV drug use.  On exam patient is overall well-appearing.  There is tenderness to palpation of the lower lumbar spine at the midline and the right paraspinal musculature.  She is ambulatory with a normal  gait.  Lower extremities appear neurovascularly intact.  No objective signs of numbness present in groin or lower extremities.  MRI of the lumbar spine shows no acute abnormalities.  It did show mild facet degeneration at L4-S1.  Urinalysis negative for UTI or other abnormalities.  Based on the unremarkable MRI, and urinalysis, suspect that her symptoms could be due to weak pelvic floor muscles.  I doubt neurological cause of her symptoms.  Patient remains in NAD and ambulatory.  Will treat pain symptomatically and advised her to follow-up with her PCP for any referrals.  Patient given short course of pain medication, muscle relaxer and lidocaine patch.  Will advise her to return to ED for any severe worsening symptoms. Patient is agreeable to the plan.  Portions of this note were generated with [employer]. Dictation errors may occur despite best attempts at proofreading.   Final Clinical Impressions(s) / ED Diagnoses   Final diagnoses:  Urge incontinence  Bilateral sciatica    ED Discharge Orders         Ordered    methocarbamol (ROBAXIN) 500 MG tablet  2 times daily     04/09/18 1446    lidocaine (LIDODERM) 5 %  Every 24 hours     04/09/18 1446    oxyCODONE-acetaminophen (PERCOCET/ROXICET) 5-325 MG tablet  Every 8 hours PRN     04/09/18 1446           Delia Heady, PA-C 04/09/18 1501    Gareth Morgan, MD 04/10/18 [phone]

## 2018-04-09 NOTE — ED Notes (Signed)
Pt ambulates up to [facility], reports her PCP, Rachal Hagler in [place] sent her here for MRI of lumbar w/o contrast but order went to [facility], spoke with Abby at Dr. Roma Kayser office to confirm and order placed.

## 2018-04-09 NOTE — Discharge Instructions (Signed)
Return to ED for worsening symptoms, trouble walking, inability to fill your legs or groin area, injuries or falls, severe back pain or abdominal pain, chest pain.

## 2018-04-10 LAB — URINE CULTURE

## 2018-04-15 ENCOUNTER — Ambulatory Visit (INDEPENDENT_AMBULATORY_CARE_PROVIDER_SITE_OTHER): Payer: Medicare Other | Admitting: Psychiatry

## 2018-04-15 DIAGNOSIS — F319 Bipolar disorder, unspecified: Secondary | ICD-10-CM

## 2018-04-15 DIAGNOSIS — F4310 Post-traumatic stress disorder, unspecified: Secondary | ICD-10-CM | POA: Diagnosis not present
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
## 2018-04-16 ENCOUNTER — Encounter: Payer: Self-pay | Admitting: Family Medicine

## 2018-04-16 ENCOUNTER — Ambulatory Visit (INDEPENDENT_AMBULATORY_CARE_PROVIDER_SITE_OTHER): Payer: Medicare Other | Admitting: Family Medicine

## 2018-04-16 ENCOUNTER — Other Ambulatory Visit: Payer: Self-pay

## 2018-04-16 VITALS — BP 144/70 | HR 78 | Temp 98.8°F | Resp 12 | Ht 62.0 in | Wt 186.1 lb

## 2018-04-16 DIAGNOSIS — Z720 Tobacco use: Secondary | ICD-10-CM | POA: Diagnosis not present

## 2018-04-16 DIAGNOSIS — G8929 Other chronic pain: Secondary | ICD-10-CM

## 2018-04-16 DIAGNOSIS — R32 Unspecified urinary incontinence: Secondary | ICD-10-CM | POA: Diagnosis not present

## 2018-04-16 DIAGNOSIS — M5441 Lumbago with sciatica, right side: Secondary | ICD-10-CM

## 2018-04-16 DIAGNOSIS — I10 Essential (primary) hypertension: Secondary | ICD-10-CM | POA: Diagnosis not present

## 2018-04-16 MED ORDER — KETOROLAC TROMETHAMINE 60 MG/2ML IM SOLN
60.0000 mg | Freq: Once | INTRAMUSCULAR | Status: AC
  Administered 2018-04-16: 60 mg via INTRAMUSCULAR

## 2018-04-16 MED ORDER — DICLOFENAC SODIUM 75 MG PO TBEC: 75.0000 mg | DELAYED_RELEASE_TABLET | Freq: Two times a day (BID) | ORAL | 0 refills | Status: DC

## 2018-04-16 MED ORDER — METHYLPREDNISOLONE ACETATE 80 MG/ML IJ SUSP
80.0000 mg | Freq: Once | INTRAMUSCULAR | Status: AC
  Administered 2018-04-16: 80 mg via INTRAMUSCULAR
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
## 2018-04-17 LAB — URINE CULTURE
MICRO NUMBER: 91132079
SPECIMEN QUALITY: ADEQUATE

## 2018-04-18 ENCOUNTER — Encounter: Payer: Self-pay | Admitting: Family Medicine

## 2018-04-29 ENCOUNTER — Ambulatory Visit (INDEPENDENT_AMBULATORY_CARE_PROVIDER_SITE_OTHER): Payer: Medicare Other | Admitting: Orthopaedic Surgery

## 2018-04-29 ENCOUNTER — Ambulatory Visit (INDEPENDENT_AMBULATORY_CARE_PROVIDER_SITE_OTHER): Payer: Medicare Other | Admitting: Psychiatry

## 2018-04-29 DIAGNOSIS — F4310 Post-traumatic stress disorder, unspecified: Secondary | ICD-10-CM | POA: Diagnosis not present

## 2018-04-29 DIAGNOSIS — F319 Bipolar disorder, unspecified: Secondary | ICD-10-CM | POA: Diagnosis not present
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
## 2018-05-12 ENCOUNTER — Encounter (HOSPITAL_COMMUNITY): Payer: Self-pay | Admitting: Psychiatry

## 2018-05-12 ENCOUNTER — Ambulatory Visit (INDEPENDENT_AMBULATORY_CARE_PROVIDER_SITE_OTHER): Payer: Medicare Other | Admitting: Psychiatry

## 2018-05-12 VITALS — BP 135/79 | HR 78 | Ht 62.0 in | Wt 188.0 lb

## 2018-05-12 DIAGNOSIS — G4700 Insomnia, unspecified: Secondary | ICD-10-CM | POA: Diagnosis not present

## 2018-05-12 DIAGNOSIS — F3162 Bipolar disorder, current episode mixed, moderate: Secondary | ICD-10-CM

## 2018-05-12 DIAGNOSIS — F419 Anxiety disorder, unspecified: Secondary | ICD-10-CM

## 2018-05-12 MED ORDER — LURASIDONE HCL 20 MG PO TABS: 20.0000 mg | ORAL_TABLET | Freq: Every day | ORAL | 0 refills | Status: DC

## 2018-05-12 NOTE — Patient Instructions (Signed)
1. Continue latuda 20 mg daily  2. Return to clinic in three months for 15 mins

## 2018-05-13 ENCOUNTER — Encounter (INDEPENDENT_AMBULATORY_CARE_PROVIDER_SITE_OTHER): Payer: Self-pay | Admitting: Orthopaedic Surgery

## 2018-05-13 ENCOUNTER — Ambulatory Visit (HOSPITAL_COMMUNITY): Payer: Medicare Other | Admitting: Psychiatry

## 2018-05-13 ENCOUNTER — Ambulatory Visit (INDEPENDENT_AMBULATORY_CARE_PROVIDER_SITE_OTHER): Payer: Medicare Other | Admitting: Orthopaedic Surgery

## 2018-05-13 VITALS — BP 139/79 | HR 76 | Ht 62.0 in | Wt 188.0 lb

## 2018-05-13 DIAGNOSIS — M545 Low back pain, unspecified: Secondary | ICD-10-CM

## 2018-05-13 DIAGNOSIS — G8929 Other chronic pain: Secondary | ICD-10-CM

## 2018-05-13 NOTE — Addendum Note (Signed)
Addended by: Meyer Cory on: 05/13/2018 02:03 PM   Modules accepted: Orders

## 2018-05-13 NOTE — Progress Notes (Signed)
Office Visit Note   Patient: [name]           Date of Birth: 07-19-1966           MRN: [id number] Visit Date: 05/13/2018              Requested by: Caren Macadam, [place], [place] [address] PCP: Patient, No Pcp Per   Assessment & Plan: Visit Diagnoses:  1. Chronic right-sided low back pain without sciatica     Plan: MRI is normal with a large spinal canal good disc hydration no central lateral recess or foraminal stenosis normal alignment and curvature.  She has trace facet arthropathy at L4-5 and L5-S1 actually less than would be expected for chronologic age.  She is gradually put on some weight and is gotten deconditioned over appear to time and I discussed with her it will take her considerable length of time to work on core strengthening balance and lower extremity strengthening.  We will set her up for some physical therapy and she understands that she will have to transition from this to working out someplace possibly at a health club over extended period of time.  I plan to recheck her in 6 to 7 weeks to check her progress.  We will set her physical therapy up in [place] where she lives.  Follow-Up Instructions: No follow-ups on file.   Orders:  No orders of the defined types were placed in this encounter.  No orders of the defined types were placed in this encounter.     Procedures: No procedures performed   Clinical Data: No additional findings.   Subjective: Chief Complaint  Patient presents with  . Lower Back - Pain    HPI 52 year old female first-time visit with history of chronic low back pain.  She had a history of volleyball injury when she was younger with a cervical fracture and lumbar fracture.  This was treated conservatively.  She has bipolar disorder at one point was in pain management with Dr. Wilford Grist as a history of having epidurals in the past which helped for a day or 2 and then recurrence of symptoms.  She stopped  her pain management 2 years ago.  She is had some prescriptions for tramadol also some Lyrica one prescription for Percocet a few tablets as well as hydrocodone a few tablets.  She denies any associated bowel or bladder symptoms.  Been constant the lumbosacral junction radiates into the right buttocks right lower extremity down the leg.  She uses a cane in her left hand when she ambulates.  In her how she cruises hanging onto the furniture.  My skin is been attained a month ago and she has a previous MRI several years ago for comparison.  She denies fever or chills.  She admits to unsteady gait and balance problems.  Review of Systems system positive for hysterectomy carpal tunnel release hernia repair C-section shoulder surgery.  She is had MRI scan of the brain which was negative.  She is also had CT scan abdomen pelvis which was negative for pelvic or abdominal lesions.  She does have PTSD.  As for cervical and ovarian cancer.  Sleep apnea history of CVA, migraines teeth and gum disease cataracts depression and anxiety as well as asthma.  Otherwise negative as it pertains HPI.   Objective: Vital Signs: BP 139/79   Pulse 76   Ht 5\' 2"  (1.575 m)   Wt 188 lb (85.3 kg)  BMI 34.39 kg/m   Physical Exam  Constitutional: She is oriented to person, place, and time. She appears well-developed.  HENT:  Head: Normocephalic.  Right Ear: External ear normal.  Left Ear: External ear normal.  Eyes: Pupils are equal, round, and reactive to light.  Neck: No tracheal deviation present. No thyromegaly present.  Cardiovascular: Normal rate.  Pulmonary/Chest: Effort normal.  Abdominal: Soft.  Neurological: She is alert and oriented to person, place, and time.  Skin: Skin is warm and dry.  Psychiatric: She has a normal mood and affect. Her behavior is normal.    Ortho Exam patient has no pain with internal or external rotation of her hips.  She has mild bilateral quad weakness some calf weakness she is  able to walk on heels and toes a few steps. Specialty Comments:  No specialty comments available.  Imaging: CLINICAL DATA:  Low back pain, minor trauma  EXAM: MRI LUMBAR SPINE WITHOUT CONTRAST  TECHNIQUE: Multiplanar, multisequence MR imaging of the lumbar spine was performed. No intravenous contrast was administered.  COMPARISON:  MRI lumbar spine 05/19/2016  FINDINGS: Segmentation:  Normal  Alignment:  Normal  Vertebrae:  Negative for fracture or mass.  No bone marrow edema.  Conus medullaris and cauda equina: Conus extends to the L1-2 level. Conus and cauda equina appear normal.  Paraspinal and other soft tissues: 2 cm left renal cyst. No paraspinous mass or fluid collection.  Disc levels:  Negative for spinal stenosis. Generous size spinal canal. Negative for disc protrusion. Mild facet degeneration at L4-5 and L5-S1 bilaterally.  IMPRESSION: No acute abnormality and no change from the prior MRI. Mild facet degeneration at L4-5 and L5-S1.   Electronically Signed   By: Franchot Gallo M.D.   On: 04/09/2018 13:20    PMFS History: Patient Active Problem List   Diagnosis Date Noted  . Colon cancer screening 02/15/2018  . Bipolar 1 disorder, mixed, moderate ([place]) 12/25/2017  . Prediabetes 10/01/2017  . Coronary artery disease 10/01/2017  . Hepatic cyst 08/18/2017  . Abdominal pain 08/18/2017  . Fibromyalgia 04/16/2017  . Sacroiliac joint pain 04/16/2017  . Incisional hernia with gangrene and obstruction 12/26/2015  . Incisional hernia with obstruction 12/26/2015  . OSA (obstructive sleep apnea) 12/13/2015  . Irritable bowel syndrome 09/25/2015  . Risk for falls 09/25/2015  . Insomnia 08/14/2015  . Hypertensive urgency 03/19/2015  . Unsteady gait 03/19/2015  . PTSD (post-traumatic stress disorder) 01/31/2014  . Chronic bilateral low back pain 08/18/2013  . DDD (degenerative disc disease), cervical 08/18/2013  . DDD (degenerative disc  disease), lumbosacral 08/18/2013  . Myalgia and myositis 08/18/2013  . Osteoarthritis 09/14/2012  . Asthma 09/14/2012  . Chronic obstructive pulmonary disease ([place]) 09/14/2012  . Gastro-esophageal reflux disease without esophagitis 09/14/2012  . Essential hypertension 09/14/2012  . Tobacco use 09/14/2012  . Insomnia due to mental disorder 05/28/2012   Past Medical History:  Diagnosis Date  . Anxiety   . Arthritis    "pretty much all over; mainly neck and back" (12/26/2015)  . Asthma   . Bipolar 1 disorder ([place])   . Bipolar disorder ([place])   . Cervical cancer ([place])    "stage I"  . Chronic back pain   . Chronic bronchitis ([place])   . COPD (chronic obstructive pulmonary disease) ([place])   . Degenerative disc disease   . Depression   . Dysrhythmia   . Fibromyalgia   . GERD (gastroesophageal reflux disease)   . Hepatic cyst   . High cholesterol   .  History of hiatal hernia   . Hypertension 08/04/2012  . IBS (irritable bowel syndrome) Diagnosed November 2012  . Incisional hernia with gangrene and obstruction 12/26/2015  . Kidney stones    "passed them"  . Melanoma of back ([place])   . Ovarian cancer ([place])    "stage II"  . Plantar fasciitis, bilateral   . Polycystic kidney disease    ruled out  . Sleep apnea    cpap coming  "soon" (12/26/2015)    Family History  Adopted: Yes  Problem Relation Age of Onset  . Bipolar disorder Mother        never diagnosed but patient suspects mother had bipolar disorder.Marland KitchenMarland KitchenMarland KitchenMarland Kitchenpt was adopeted, only knew birth mother for a short period of time.  Marland Kitchen Anxiety disorder Mother   . Cirrhosis Mother   . Diabetes Mother   . Turner syndrome Daughter   . Cancer Daughter   . Bipolar disorder Daughter   . Interstitial cystitis Daughter   . ADD / ADHD Neg Hx   . Alcohol abuse Neg Hx   . Drug abuse Neg Hx   . Dementia Neg Hx   . Depression Neg Hx   . OCD Neg Hx   . Paranoid behavior Neg Hx   . Schizophrenia Neg Hx   . Seizures Neg Hx   . Sexual abuse Neg Hx    . Physical abuse Neg Hx   . Colon cancer Neg Hx     Past Surgical History:  Procedure Laterality Date  . ABDOMINAL HYSTERECTOMY  1997  . APPENDECTOMY    . CARDIAC CATHETERIZATION  2015  . CARPAL TUNNEL RELEASE Right   . CESAREAN SECTION  1987  . CHOLECYSTECTOMY OPEN  1998  . COLONOSCOPY  November 2012  . EYE SURGERY     CATARACTS REMOVED 09/09/17 and 09/16/17  . HERNIA REPAIR    . INCISIONAL HERNIA REPAIR N/A 12/26/2015   Procedure: LAPAROSCOPIC REPAIR INCISIONAL HERNIA WITH MESH;  Surgeon: Fanny Skates, MD;  Location: [facility];  Service: General;  Laterality: N/A;  . INSERTION OF MESH N/A 12/26/2015   Procedure: INSERTION OF MESH;  Surgeon: Fanny Skates, MD;  Location: [facility];  Service: General;  Laterality: N/A;  . [place]  . LAPAROSCOPIC INCISIONAL / UMBILICAL / VENTRAL HERNIA REPAIR  12/26/2015   repair of incarcerated incisional hernia with mesh  . LIVER CYST REMOVAL  2014  . MELANOMA EXCISION  January 2013   removed from back  . SHOULDER SURGERY Left 2010   Humeral Head Microfracture   . TUBAL LIGATION    . UPPER GASTROINTESTINAL ENDOSCOPY  November 2012   Social History   Occupational History    Comment: disabled  Tobacco Use  . Smoking status: Current Every Day Smoker    Packs/day: 0.50    Years: 46.00    Pack years: 23.00    Types: Cigarettes  . Smokeless tobacco: Never Used  . Tobacco comment: in process of quitting, using chantix  Substance and Sexual Activity  . Alcohol use: No    Comment: 12/26/2015 'quit in ~ 1997"  . Drug use: No    Comment: 12/26/2015 "quit in ~  2010"  . Sexual activity: Yes    Birth control/protection: Surgical    Comment: hyst

## 2018-05-20 ENCOUNTER — Ambulatory Visit (INDEPENDENT_AMBULATORY_CARE_PROVIDER_SITE_OTHER): Payer: Medicare Other | Admitting: Psychiatry

## 2018-05-20 DIAGNOSIS — F3162 Bipolar disorder, current episode mixed, moderate: Secondary | ICD-10-CM

## 2018-05-20 DIAGNOSIS — F4310 Post-traumatic stress disorder, unspecified: Secondary | ICD-10-CM
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
## 2018-05-26 ENCOUNTER — Ambulatory Visit (HOSPITAL_COMMUNITY): Payer: Medicare Other | Admitting: Psychiatry

## 2018-06-04 DIAGNOSIS — R350 Frequency of micturition: Secondary | ICD-10-CM | POA: Diagnosis not present

## 2018-06-08 DIAGNOSIS — R5383 Other fatigue: Secondary | ICD-10-CM | POA: Diagnosis not present

## 2018-06-08 DIAGNOSIS — G4700 Insomnia, unspecified: Secondary | ICD-10-CM | POA: Diagnosis not present

## 2018-06-08 DIAGNOSIS — I10 Essential (primary) hypertension: Secondary | ICD-10-CM | POA: Diagnosis not present

## 2018-06-08 DIAGNOSIS — E785 Hyperlipidemia, unspecified: Secondary | ICD-10-CM | POA: Diagnosis not present

## 2018-06-08 DIAGNOSIS — N951 Menopausal and female climacteric states: Secondary | ICD-10-CM | POA: Diagnosis not present

## 2018-06-08 DIAGNOSIS — E559 Vitamin D deficiency, unspecified: Secondary | ICD-10-CM | POA: Diagnosis not present

## 2018-06-08 DIAGNOSIS — M797 Fibromyalgia: Secondary | ICD-10-CM | POA: Diagnosis not present

## 2018-06-09 ENCOUNTER — Ambulatory Visit (HOSPITAL_COMMUNITY): Payer: Medicare Other

## 2018-06-09 ENCOUNTER — Telehealth (HOSPITAL_COMMUNITY): Payer: Self-pay

## 2018-06-09 DIAGNOSIS — R350 Frequency of micturition: Secondary | ICD-10-CM | POA: Diagnosis not present

## 2018-06-09 NOTE — Telephone Encounter (Signed)
F/u call to why pt did not come to eval today. Called pt she forgot and wants to  r/s  will call us back to do that. NF 06/09/18

## 2018-06-16 ENCOUNTER — Ambulatory Visit (HOSPITAL_COMMUNITY): Payer: Self-pay

## 2018-06-18 DIAGNOSIS — R160 Hepatomegaly, not elsewhere classified: Secondary | ICD-10-CM | POA: Diagnosis not present

## 2018-06-18 DIAGNOSIS — K760 Fatty (change of) liver, not elsewhere classified: Secondary | ICD-10-CM | POA: Diagnosis not present

## 2018-06-18 DIAGNOSIS — K7689 Other specified diseases of liver: Secondary | ICD-10-CM | POA: Diagnosis not present

## 2018-06-29 ENCOUNTER — Encounter (HOSPITAL_COMMUNITY): Payer: Self-pay | Admitting: Psychiatry

## 2018-06-29 ENCOUNTER — Ambulatory Visit (INDEPENDENT_AMBULATORY_CARE_PROVIDER_SITE_OTHER): Payer: Medicare Other | Admitting: Psychiatry

## 2018-06-29 DIAGNOSIS — F3162 Bipolar disorder, current episode mixed, moderate: Secondary | ICD-10-CM

## 2018-06-29 DIAGNOSIS — F4310 Post-traumatic stress disorder, unspecified: Secondary | ICD-10-CM
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
## 2018-07-01 ENCOUNTER — Other Ambulatory Visit: Payer: Self-pay

## 2018-07-01 ENCOUNTER — Ambulatory Visit (HOSPITAL_COMMUNITY): Payer: Medicare Other | Attending: Orthopaedic Surgery | Admitting: Physical Therapy

## 2018-07-01 DIAGNOSIS — R262 Difficulty in walking, not elsewhere classified: Secondary | ICD-10-CM | POA: Insufficient documentation

## 2018-07-01 DIAGNOSIS — M5416 Radiculopathy, lumbar region: Secondary | ICD-10-CM | POA: Insufficient documentation

## 2018-07-01 DIAGNOSIS — M6281 Muscle weakness (generalized): Secondary | ICD-10-CM | POA: Insufficient documentation

## 2018-07-01 NOTE — Patient Instructions (Addendum)
Toe Raise (Sitting)    Raise toes, keeping heels on floor. Now raise your heels  Repeat _10___ times per set. Do ___1_ sets per session. Do ___2_ sessions per day.  http://orth.exer.us/46   Copyright  VHI. All rights reserved.  While sitting push with your heels, raise up into the tallest sitting position you can. Hold for 3 seconds and relax.  Repeat 10 x twice a day.Knee Extension (Sitting)    Place ___0_ pound weight on left ankle and straighten knee fully, lower slowly. Repeat _10___ times per set. Do __1__ sets per session. Do __2__ sessions per day. Repeat to the right  http://orth.exer.us/732   Copyright  VHI. All rights reserved.  While sitting as tall as possible  Tighten stomach together.  Hold for 3 seconds relax repeat 10 x

## 2018-07-01 NOTE — Therapy (Signed)
[facility] [place], [place], [address] Phone: [phone]   Fax:  [phone]  Physical Therapy Evaluation  Patient Details  Name: [name] MRN: [id number] Date of Birth: 1966-07-11 Referring Provider (PT): Rodell Perna   Encounter Date: 07/01/2018  PT End of Session - 07/01/18 1605    Visit Number  1    Number of Visits  12    Date for PT Re-Evaluation  08/12/18    Authorization Type  medicare    PT Start Time  1520    PT Stop Time  1600    PT Time Calculation (min)  40 min    Activity Tolerance  Patient limited by fatigue    Behavior During Therapy  [facility] for tasks assessed/performed       Past Medical History:  Diagnosis Date  . Anxiety   . Arthritis    "pretty much all over; mainly neck and back" (12/26/2015)  . Asthma   . Bipolar 1 disorder ([facility])   . Bipolar disorder ([facility])   . Cervical cancer ([facility])    "stage I"  . Chronic back pain   . Chronic bronchitis ([facility])   . COPD (chronic obstructive pulmonary disease) ([facility])   . Degenerative disc disease   . Depression   . Dysrhythmia   . Fibromyalgia   . GERD (gastroesophageal reflux disease)   . Hepatic cyst   . High cholesterol   . History of hiatal hernia   . Hypertension 08/04/2012  . IBS (irritable bowel syndrome) Diagnosed November 2012  . Incisional hernia with gangrene and obstruction 12/26/2015  . Kidney stones    "passed them"  . Melanoma of back ([facility])   . Ovarian cancer ([facility])    "stage II"  . Plantar fasciitis, bilateral   . Polycystic kidney disease    ruled out  . Sleep apnea    cpap coming  "soon" (12/26/2015)    Past Surgical History:  Procedure Laterality Date  . ABDOMINAL HYSTERECTOMY  1997  . APPENDECTOMY    . CARDIAC CATHETERIZATION  2015  . CARPAL TUNNEL RELEASE Right   . CESAREAN SECTION  1987  . CHOLECYSTECTOMY OPEN  1998  . COLONOSCOPY  November 2012  . EYE SURGERY     CATARACTS REMOVED 09/09/17 and 09/16/17  . HERNIA  REPAIR    . INCISIONAL HERNIA REPAIR N/A 12/26/2015   Procedure: LAPAROSCOPIC REPAIR INCISIONAL HERNIA WITH MESH;  Surgeon: Fanny Skates, MD;  Location: [facility];  Service: General;  Laterality: N/A;  . INSERTION OF MESH N/A 12/26/2015   Procedure: INSERTION OF MESH;  Surgeon: Fanny Skates, MD;  Location: [facility];  Service: General;  Laterality: N/A;  . [place]  . LAPAROSCOPIC INCISIONAL / UMBILICAL / VENTRAL HERNIA REPAIR  12/26/2015   repair of incarcerated incisional hernia with mesh  . LIVER CYST REMOVAL  2014  . MELANOMA EXCISION  January 2013   removed from back  . SHOULDER SURGERY Left 2010   Humeral Head Microfracture   . TUBAL LIGATION    . UPPER GASTROINTESTINAL ENDOSCOPY  November 2012    There were no vitals filed for this visit.   Subjective Assessment - 07/01/18 1525    Subjective  Ms. Oscarson states that she has had low back pain ever since she was 14 and had an injury which cracked vertebrae in her cervical and lumbar year.  She has been going to the pain clinic for seven months.  She wanted to see if she could find something to improve her sx.  She does not do any exercises for her back at this point due to everything seeming to make it worse.  Her pain is greatest on the right side and will radiate to her feel.     Limitations  Sitting;Lifting;Standing;Walking;House hold activities    How long can you sit comfortably?  15 minutes     How long can you stand comfortably?  20 minutes     How long can you walk comfortably?  ambulates with a cane less than five minutes     Patient Stated Goals  less pain improved mobility     Currently in Pain?  Yes    Pain Score  6    12/10 bad days; 3/10 on a good day    Pain Location  Back    Pain Orientation  Right    Pain Descriptors / Indicators  Aching;Dull    Pain Type  Chronic pain    Pain Radiating Towards  Rt foot     Pain Onset  More than a month ago    Pain Frequency  Constant    Aggravating  Factors   everything     Pain Relieving Factors  nothing has tried heat, injections,          [facility] PT Assessment - 07/01/18 0001      Assessment   Medical Diagnosis  Low back pain     Referring Provider (PT)  Rodell Perna    Onset Date/Surgical Date  --   chronic with acute exacerbation  12/26/2017   Next MD Visit  not scheduled     Prior Therapy  none      Precautions   Precautions  None      Restrictions   Weight Bearing Restrictions  No      Balance Screen   Has the patient fallen in the past 6 months  Yes    How many times?  1    Has the patient had a decrease in activity level because of a fear of falling?   Yes    Is the patient reluctant to leave their home because of a fear of falling?   Yes      Home Environment   Living Environment  Private residence    Home Access  Stairs to enter    Entrance Stairs-Number of Steps  5   does not complete reciprocally    Entrance Stairs-Rails  Right      Prior Function   Level of Independence  Independent      Cognition   Overall Cognitive Status  Within Functional Limits for tasks assessed      Observation/Other Assessments   Focus on Therapeutic Outcomes (FOTO)   69      Functional Tests   Functional tests  Single leg stance;Sit to Stand      Single Leg Stance   Comments  RT 7" LT 13"       Sit to Stand   Comments  unable without use of B UE      ROM / Strength   AROM / PROM / Strength  AROM;Strength      Strength   Strength Assessment Site  Hip;Knee;Ankle    Right/Left Hip  Right;Left    Right Hip Flexion  2/5    Right Hip Extension  2/5    Right Hip ABduction  3-/5    Left Hip Flexion  4-/5    Left Hip Extension  3-/5    Left Hip ABduction  4-/5    Right/Left Knee  Right;Left    Right Knee Flexion  3/5    Left Knee Flexion  4+/5    Right/Left Ankle  Right;Left    Right Ankle Dorsiflexion  3-/5    Left Ankle Dorsiflexion  4-/5      Ambulation/Gait   Ambulation/Gait  Yes    Ambulation/Gait Assistance   --   only able to walk for 1:35   Ambulation Distance (Feet)  226 Feet    Assistive device  Straight cane                Objective measurements completed on examination: See above findings.      [facility] Adult PT Treatment/Exercise - 07/01/18 0001      Exercises   Exercises  Lumbar      Lumbar Exercises: Seated   Long Arc Quad on Chair  Strengthening;Both;10 reps    Other Seated Lumbar Exercises  abdominal isometric x 10 ; sit as tall as possible x 10, Ankle DF/PF B x 10              PT Education - 07/01/18 1605    Education Details  HEP    Person(s) Educated  Patient    Methods  Explanation;Handout;Demonstration    Comprehension  Verbalized understanding;Returned demonstration       PT Short Term Goals - 07/01/18 1611      PT SHORT TERM GOAL #1   Title  Pt pain level to be no greater than a 7/10 to allow pt to stand for 20 minutes in order to make a small meal in comfort.     Time  3    Period  Weeks    Status  New    Target Date  07/22/18      PT SHORT TERM GOAL #2   Title  Pt to be able to walk with her cane for five minutes without having to sit down in order to be functional with household activities.     Time  3    Period  Weeks    Status  New      PT SHORT TERM GOAL #3   Title  PT LE strength to increase 1/2 grade to allow pt to be able to come from sit to stand with no UE support.     Time  3    Period  Weeks    Status  New      PT SHORT TERM GOAL #4   Title  PT to be able to sit for 30 minutes in comfort to be able to eat a meal without increased pain.         PT Long Term Goals - 07/01/18 1614      PT LONG TERM GOAL #1   Title  Pt pain to be no greater than a 5/10 to allow pt to be able to walk with her cane for 10 minutes in order to be able to complete short shopping trips     Time  6    Period  Weeks    Status  New    Target Date  08/12/18      PT LONG TERM GOAL #2   Title  PT to be able to sit for 60 minutes without  increased pain to be able to travel to MD appointments in relative comfort  Time  6    Period  Weeks    Status  New      PT LONG TERM GOAL #3   Title  Pt LE strength to be increased by one grade to allow pt to be able to go up and down 5 steps in a reciprocal manner with her cane     Time  6    Period  Weeks    Status  New      PT LONG TERM GOAL #4   Title  PT to be able to single leg stance B for 30 seconds to reduce risk of falls     Time  6    Period  Weeks             Plan - 07/01/18 1606    Clinical Impression Statement  Ms. Howze is a 52 yo female with chronic radicular back pain which she has had for years.  She has been medicated and has had injections but has not had anything that kept her pain away.  She has noticed a decline in function and has been referred to skilled PT .  Evaluation demonstrates decreased strength, decreased balance, decreased activity tolerance , increased pain, obesity and postrual dysfunction.  Ms. Sicard will benefit from skilled PT to address these issues and improve her functional ability.     History and Personal Factors relevant to plan of care:  asthma, chronic back and cervical pain,     Clinical Presentation  Stable    Clinical Decision Making  Moderate    Rehab Potential  Good    PT Frequency  2x / week    PT Duration  6 weeks    PT Treatment/Interventions  ADLs/Self Care Home Management;Manual techniques;Patient/family education;Therapeutic activities;Therapeutic exercise;Functional mobility training;Stair training;Gait training;Balance training    PT Next Visit Plan  begin lumbar stabilization to include bent knee lift and bridging as well as decompression 1-5 please give as a HEP     PT Home Exercise Plan  sitting: LAQ, abdominal isometric, sitting tall, DF     Consulted and Agree with Plan of Care  Patient       Patient will benefit from skilled therapeutic intervention in order to improve the following deficits and  impairments:  Decreased activity tolerance, Decreased balance, Cardiopulmonary status limiting activity, Decreased mobility, Decreased range of motion, Decreased strength, Difficulty walking, Obesity, Postural dysfunction  Visit Diagnosis: Muscle weakness (generalized) - Plan: PT plan of care cert/re-cert  Difficulty in walking, not elsewhere classified - Plan: PT plan of care cert/re-cert  Radiculopathy, lumbar region - Plan: PT plan of care cert/re-cert     Problem List Patient Active Problem List   Diagnosis Date Noted  . Colon cancer screening 02/15/2018  . Bipolar 1 disorder, mixed, moderate ([facility]) 12/25/2017  . Prediabetes 10/01/2017  . Coronary artery disease 10/01/2017  . Hepatic cyst 08/18/2017  . Abdominal pain 08/18/2017  . Fibromyalgia 04/16/2017  . Sacroiliac joint pain 04/16/2017  . Incisional hernia with gangrene and obstruction 12/26/2015  . Incisional hernia with obstruction 12/26/2015  . OSA (obstructive sleep apnea) 12/13/2015  . Irritable bowel syndrome 09/25/2015  . Risk for falls 09/25/2015  . Insomnia 08/14/2015  . Hypertensive urgency 03/19/2015  . Unsteady gait 03/19/2015  . PTSD (post-traumatic stress disorder) 01/31/2014  . Chronic bilateral low back pain 08/18/2013  . DDD (degenerative disc disease), cervical 08/18/2013  . DDD (degenerative disc disease), lumbosacral 08/18/2013  . Myalgia and myositis 08/18/2013  . Osteoarthritis  09/14/2012  . Asthma 09/14/2012  . Chronic obstructive pulmonary disease ([facility]) 09/14/2012  . Gastro-esophageal reflux disease without esophagitis 09/14/2012  . Essential hypertension 09/14/2012  . Tobacco use 09/14/2012  . Insomnia due to mental disorder 05/28/2012    Rayetta Humphrey, PT CLT [phone] 07/01/2018, 4:20 PM  [facility] [place], [place], [address] Phone: [phone]   Fax:  [phone]  Name: [name] MRN: [id number] Date of Birth:  October 30, 1965

## 2018-07-05 ENCOUNTER — Encounter (HOSPITAL_COMMUNITY): Payer: Self-pay | Admitting: Physical Therapy

## 2018-07-05 ENCOUNTER — Telehealth (HOSPITAL_COMMUNITY): Payer: Self-pay | Admitting: General Practice

## 2018-07-05 NOTE — Telephone Encounter (Signed)
07/05/18  Pt called to cx all future appts

## 2018-07-05 NOTE — Therapy (Signed)
[facility] [address] [place], [place], [address] Phone: [phone]   Fax:  [phone]  Patient Details  Name: [name] MRN: [id number] Date of Birth: [birth year] Referring Provider:  Rodell Perna Encounter Date: 07/05/2018    PHYSICAL THERAPY DISCHARGE SUMMARY  Visits from Start of Care: 1  Current functional level related to goals / functional outcomes: See eval    Remaining deficits: all   Education / Equipment: HEP Plan: Patient agrees to discharge.  Patient goals were not met. Patient is being discharged due to the patient's request.  ?????       PT called and stated that she was not going to return for treatment.  Pt seen for evaluation only.  Rayetta Humphrey, PT CLT [phone] 07/05/2018, 1:47 PM  [facility] [address] [place], [place], [address] Phone: [phone]   Fax:  [phone]

## 2018-07-06 ENCOUNTER — Ambulatory Visit (HOSPITAL_COMMUNITY): Payer: Medicare Other | Admitting: Physical Therapy

## 2018-07-08 ENCOUNTER — Encounter (HOSPITAL_COMMUNITY): Payer: Self-pay | Admitting: Physical Therapy

## 2018-07-12 ENCOUNTER — Ambulatory Visit (INDEPENDENT_AMBULATORY_CARE_PROVIDER_SITE_OTHER): Payer: Medicare Other | Admitting: Psychiatry

## 2018-07-12 ENCOUNTER — Encounter (HOSPITAL_COMMUNITY): Payer: Self-pay | Admitting: Physical Therapy

## 2018-07-12 DIAGNOSIS — F3162 Bipolar disorder, current episode mixed, moderate: Secondary | ICD-10-CM

## 2018-07-12 DIAGNOSIS — F4310 Post-traumatic stress disorder, unspecified: Secondary | ICD-10-CM
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
## 2018-07-15 ENCOUNTER — Encounter (HOSPITAL_COMMUNITY): Payer: Self-pay | Admitting: Physical Therapy

## 2018-07-20 ENCOUNTER — Encounter (HOSPITAL_COMMUNITY): Payer: Self-pay | Admitting: Physical Therapy

## 2018-07-22 ENCOUNTER — Encounter (HOSPITAL_COMMUNITY): Payer: Self-pay | Admitting: Physical Therapy

## 2018-07-27 ENCOUNTER — Encounter (HOSPITAL_COMMUNITY): Payer: Self-pay | Admitting: Physical Therapy

## 2018-07-29 ENCOUNTER — Encounter (HOSPITAL_COMMUNITY): Payer: Self-pay | Admitting: Physical Therapy

## 2018-07-30 ENCOUNTER — Ambulatory Visit (HOSPITAL_COMMUNITY): Payer: Medicare Other | Admitting: Psychiatry

## 2018-08-03 ENCOUNTER — Encounter (HOSPITAL_COMMUNITY): Payer: Self-pay | Admitting: Physical Therapy

## 2018-08-03 ENCOUNTER — Ambulatory Visit (HOSPITAL_COMMUNITY): Payer: Medicare Other | Admitting: Psychiatry

## 2018-08-05 ENCOUNTER — Encounter (HOSPITAL_COMMUNITY): Payer: Self-pay | Admitting: Physical Therapy

## 2018-08-10 ENCOUNTER — Encounter (HOSPITAL_COMMUNITY): Payer: Self-pay | Admitting: Physical Therapy

## 2018-08-12 ENCOUNTER — Ambulatory Visit (HOSPITAL_COMMUNITY): Payer: Medicare Other | Admitting: Psychiatry

## 2018-08-12 ENCOUNTER — Encounter (HOSPITAL_COMMUNITY): Payer: Self-pay | Admitting: Physical Therapy
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
## 2018-08-24 ENCOUNTER — Encounter (HOSPITAL_COMMUNITY): Payer: Self-pay | Admitting: Psychiatry

## 2018-08-24 ENCOUNTER — Ambulatory Visit (INDEPENDENT_AMBULATORY_CARE_PROVIDER_SITE_OTHER): Payer: Medicare Other | Admitting: Psychiatry

## 2018-08-24 ENCOUNTER — Ambulatory Visit (HOSPITAL_COMMUNITY): Payer: Medicaid Other | Admitting: Psychiatry

## 2018-08-24 VITALS — BP 160/90 | HR 81 | Ht 62.0 in | Wt 183.0 lb

## 2018-08-24 DIAGNOSIS — F3162 Bipolar disorder, current episode mixed, moderate: Secondary | ICD-10-CM

## 2018-08-24 MED ORDER — ZOLPIDEM TARTRATE 5 MG PO TABS: 5.0000 mg | ORAL_TABLET | Freq: Every evening | ORAL | 2 refills | Status: DC | PRN

## 2018-08-24 MED ORDER — LURASIDONE HCL 40 MG PO TABS: 40.0000 mg | ORAL_TABLET | Freq: Every day | ORAL | 0 refills | Status: DC

## 2018-08-24 NOTE — Patient Instructions (Signed)
1. Increase latuda 40 mg daily  2. Start ambien 5 mg at night as needed for sleep 2. Return to clinic in three months for 15 mins

## 2018-08-25 ENCOUNTER — Ambulatory Visit (INDEPENDENT_AMBULATORY_CARE_PROVIDER_SITE_OTHER): Payer: Medicare Other | Admitting: Psychiatry

## 2018-08-25 ENCOUNTER — Encounter (HOSPITAL_COMMUNITY): Payer: Self-pay | Admitting: Psychiatry

## 2018-08-25 ENCOUNTER — Telehealth (HOSPITAL_COMMUNITY): Payer: Self-pay | Admitting: *Deleted

## 2018-08-25 DIAGNOSIS — F3162 Bipolar disorder, current episode mixed, moderate: Secondary | ICD-10-CM | POA: Diagnosis not present

## 2018-08-25 DIAGNOSIS — F4310 Post-traumatic stress disorder, unspecified: Secondary | ICD-10-CM

## 2018-08-25 NOTE — Telephone Encounter (Signed)
Dr Modesta Messing Patient left message as having her Therapy appointment with Vickii Chafe that the Latuda increase wasn't @ her Rx

## 2018-08-25 NOTE — Telephone Encounter (Signed)
Which pharmacy she goes to? I ordered latuda to [facility].

## 2018-08-25 NOTE — Telephone Encounter (Signed)
Order should be there. Please verify with the pharmacy

## 2018-08-25 NOTE — Telephone Encounter (Signed)
Dr Modesta Messing  [name] 40 mg received 08/24/2018 Latuda 20 mg is that the additional increase? It's not @ Rx. 05/12/2018 last sent
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
## 2018-08-25 NOTE — Telephone Encounter (Signed)
Dr Modesta Messing Patient called stating that she started the Ambien & "became violently ill" She stated that she was vomiting over  10 mins after taking the medication.

## 2018-08-25 NOTE — Telephone Encounter (Signed)
[facility] is correct

## 2018-08-25 NOTE — Telephone Encounter (Signed)
Advise her to discontinue Ambien. Let's see how latuda works (increased up the dose) without trying other medication given her history of side effect from other medication in the past.

## 2018-08-26 NOTE — Telephone Encounter (Signed)
Yes, I ordered 40 mg daily, and she was advised to take that dose.

## 2018-09-07 ENCOUNTER — Ambulatory Visit (HOSPITAL_COMMUNITY): Payer: Medicaid Other | Admitting: Psychiatry

## 2018-09-07 DIAGNOSIS — D171 Benign lipomatous neoplasm of skin and subcutaneous tissue of trunk: Secondary | ICD-10-CM | POA: Insufficient documentation

## 2018-09-21 ENCOUNTER — Ambulatory Visit (INDEPENDENT_AMBULATORY_CARE_PROVIDER_SITE_OTHER): Payer: Medicare Other | Admitting: Psychiatry

## 2018-09-21 DIAGNOSIS — F3162 Bipolar disorder, current episode mixed, moderate: Secondary | ICD-10-CM | POA: Diagnosis not present

## 2018-09-21 DIAGNOSIS — F4310 Post-traumatic stress disorder, unspecified: Secondary | ICD-10-CM
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
## 2018-10-06 ENCOUNTER — Ambulatory Visit (INDEPENDENT_AMBULATORY_CARE_PROVIDER_SITE_OTHER): Payer: Medicare Other | Admitting: Psychiatry

## 2018-10-06 ENCOUNTER — Other Ambulatory Visit: Payer: Self-pay

## 2018-10-06 DIAGNOSIS — F3162 Bipolar disorder, current episode mixed, moderate: Secondary | ICD-10-CM | POA: Diagnosis not present

## 2018-10-06 DIAGNOSIS — F4310 Post-traumatic stress disorder, unspecified: Secondary | ICD-10-CM
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
## 2018-10-22 ENCOUNTER — Ambulatory Visit (HOSPITAL_COMMUNITY): Payer: Medicare Other | Admitting: Psychiatry

## 2018-11-03 ENCOUNTER — Ambulatory Visit (INDEPENDENT_AMBULATORY_CARE_PROVIDER_SITE_OTHER): Payer: Medicare Other | Admitting: Psychiatry

## 2018-11-03 ENCOUNTER — Other Ambulatory Visit: Payer: Self-pay

## 2018-11-03 DIAGNOSIS — F3162 Bipolar disorder, current episode mixed, moderate: Secondary | ICD-10-CM | POA: Diagnosis not present
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
## 2018-11-16 ENCOUNTER — Other Ambulatory Visit: Payer: Self-pay

## 2018-11-16 ENCOUNTER — Ambulatory Visit (INDEPENDENT_AMBULATORY_CARE_PROVIDER_SITE_OTHER): Payer: Medicare Other | Admitting: Psychiatry

## 2018-11-16 DIAGNOSIS — F4310 Post-traumatic stress disorder, unspecified: Secondary | ICD-10-CM

## 2018-11-16 DIAGNOSIS — F3162 Bipolar disorder, current episode mixed, moderate: Secondary | ICD-10-CM | POA: Diagnosis not present
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
## 2018-11-23 ENCOUNTER — Other Ambulatory Visit: Payer: Self-pay

## 2018-11-23 ENCOUNTER — Encounter (HOSPITAL_COMMUNITY): Payer: Self-pay | Admitting: Psychiatry

## 2018-11-23 ENCOUNTER — Ambulatory Visit (INDEPENDENT_AMBULATORY_CARE_PROVIDER_SITE_OTHER): Payer: Medicare Other | Admitting: Psychiatry

## 2018-11-23 DIAGNOSIS — F4310 Post-traumatic stress disorder, unspecified: Secondary | ICD-10-CM

## 2018-11-23 DIAGNOSIS — F3162 Bipolar disorder, current episode mixed, moderate: Secondary | ICD-10-CM | POA: Diagnosis not present

## 2018-11-23 MED ORDER — LURASIDONE HCL 40 MG PO TABS: 40.0000 mg | ORAL_TABLET | Freq: Every day | ORAL | 0 refills | Status: DC

## 2018-11-23 NOTE — Patient Instructions (Addendum)
1  Continuelatuda 40 mg daily 2. Discontinue Ambien 3. Next appointment 7/20 at 2 PM

## 2018-12-01 ENCOUNTER — Ambulatory Visit (HOSPITAL_COMMUNITY): Payer: Medicare Other | Admitting: Psychiatry

## 2018-12-01 ENCOUNTER — Other Ambulatory Visit: Payer: Self-pay

## 2018-12-15 ENCOUNTER — Other Ambulatory Visit: Payer: Self-pay

## 2018-12-15 ENCOUNTER — Ambulatory Visit (INDEPENDENT_AMBULATORY_CARE_PROVIDER_SITE_OTHER): Payer: Medicare Other | Admitting: Psychiatry

## 2018-12-15 DIAGNOSIS — F3162 Bipolar disorder, current episode mixed, moderate: Secondary | ICD-10-CM
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
## 2018-12-24 ENCOUNTER — Other Ambulatory Visit: Payer: Self-pay

## 2018-12-24 ENCOUNTER — Ambulatory Visit (INDEPENDENT_AMBULATORY_CARE_PROVIDER_SITE_OTHER): Payer: Medicare Other | Admitting: Psychiatry

## 2018-12-24 DIAGNOSIS — F3162 Bipolar disorder, current episode mixed, moderate: Secondary | ICD-10-CM | POA: Diagnosis not present
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
## 2018-12-27 LAB — TSH: TSH: 1.27 (ref <=5.90)

## 2018-12-27 LAB — LIPID PANEL
Cholesterol: 166 (ref 0–200)
HDL: 33 — AB (ref 35–70)
LDL Cholesterol: 114
Triglycerides: 96 (ref 40–160)

## 2018-12-28 LAB — VITAMIN D 25 HYDROXY (VIT D DEFICIENCY, FRACTURES): Vit D, 25-Hydroxy: 18.9

## 2018-12-28 LAB — BASIC METABOLIC PANEL
BUN: 8 (ref 4–21)
Creatinine: 0.8 (ref <=1.1)
Glucose: 96
Potassium: 4 (ref 3.4–5.3)
Sodium: 145 (ref 137–147)

## 2018-12-28 LAB — HEPATIC FUNCTION PANEL
ALT: 18 (ref 7–35)
AST: 21 (ref 13–35)
Alkaline Phosphatase: 85 (ref 25–125)

## 2018-12-28 LAB — HEMOGLOBIN A1C: Hemoglobin A1C: 5.6

## 2018-12-31 ENCOUNTER — Ambulatory Visit (INDEPENDENT_AMBULATORY_CARE_PROVIDER_SITE_OTHER): Payer: Medicare Other | Admitting: Psychiatry

## 2018-12-31 ENCOUNTER — Other Ambulatory Visit: Payer: Self-pay

## 2018-12-31 DIAGNOSIS — F3162 Bipolar disorder, current episode mixed, moderate: Secondary | ICD-10-CM | POA: Diagnosis not present
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
## 2019-01-06 ENCOUNTER — Ambulatory Visit (HOSPITAL_COMMUNITY): Payer: Medicare Other | Admitting: Psychiatry

## 2019-01-06 ENCOUNTER — Other Ambulatory Visit: Payer: Self-pay

## 2019-02-07 ENCOUNTER — Other Ambulatory Visit: Payer: Self-pay

## 2019-02-07 ENCOUNTER — Ambulatory Visit (INDEPENDENT_AMBULATORY_CARE_PROVIDER_SITE_OTHER): Payer: Medicare Other | Admitting: Psychiatry

## 2019-02-07 DIAGNOSIS — F3162 Bipolar disorder, current episode mixed, moderate: Secondary | ICD-10-CM
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
## 2019-02-14 ENCOUNTER — Encounter (HOSPITAL_COMMUNITY): Payer: Self-pay | Admitting: Psychiatry

## 2019-02-14 ENCOUNTER — Ambulatory Visit (INDEPENDENT_AMBULATORY_CARE_PROVIDER_SITE_OTHER): Payer: Medicare Other | Admitting: Psychiatry

## 2019-02-14 ENCOUNTER — Other Ambulatory Visit: Payer: Self-pay

## 2019-02-14 DIAGNOSIS — F3130 Bipolar disorder, current episode depressed, mild or moderate severity, unspecified: Secondary | ICD-10-CM | POA: Diagnosis not present

## 2019-02-14 MED ORDER — LURASIDONE HCL 40 MG PO TABS: 40.0000 mg | ORAL_TABLET | Freq: Every day | ORAL | 0 refills | Status: DC

## 2019-02-14 NOTE — Patient Instructions (Signed)
1  Continuelatuda40 mg daily 3.Next appointment 10/16 at 9:20 AM

## 2019-02-16 ENCOUNTER — Ambulatory Visit (INDEPENDENT_AMBULATORY_CARE_PROVIDER_SITE_OTHER): Payer: Medicare Other | Admitting: Psychiatry

## 2019-02-16 ENCOUNTER — Other Ambulatory Visit: Payer: Self-pay

## 2019-02-16 DIAGNOSIS — F4310 Post-traumatic stress disorder, unspecified: Secondary | ICD-10-CM

## 2019-02-16 DIAGNOSIS — F3130 Bipolar disorder, current episode depressed, mild or moderate severity, unspecified: Secondary | ICD-10-CM | POA: Diagnosis not present
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
## 2019-02-21 ENCOUNTER — Ambulatory Visit (HOSPITAL_COMMUNITY): Payer: Medicare Other | Admitting: Psychiatry

## 2019-02-28 ENCOUNTER — Telehealth (HOSPITAL_COMMUNITY): Payer: Self-pay | Admitting: *Deleted

## 2019-02-28 LAB — CBC AND DIFFERENTIAL
HCT: 41 (ref 36–46)
Hemoglobin: 14.1 (ref 12.0–16.0)
Platelets: 228 (ref 150–399)
WBC: 8.6

## 2019-02-28 NOTE — Telephone Encounter (Signed)
I believe she has been taking that medication at least for several months without any side effect. Does she take medication with meal? (if not, encourage her to do so). If she would like to reduce the dose, we can try 20 mg daily. Let me know if she needs an order.

## 2019-02-28 NOTE — Telephone Encounter (Signed)
8/4 APPT SCHEDULED

## 2019-02-28 NOTE — Telephone Encounter (Signed)
Advise her to have sooner appointment for further evaluation/discuss options.

## 2019-02-28 NOTE — Telephone Encounter (Signed)
PATIENT CALLED STATED THAT THE LATUDA IS MAKING HER "VIOLENTLY ILL THROWING-UP"  HAPPENS USUALLY  ~~ 30 MIN. AFTER TAKING
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
## 2019-02-28 NOTE — Telephone Encounter (Signed)
SPOKE WITH PATIENT &  SHE'S NOT INTERESTED IN CONTINUING WITH LATUDA PREFERS TO TRY SOMETHING ELSE

## 2019-03-01 ENCOUNTER — Other Ambulatory Visit: Payer: Self-pay

## 2019-03-01 ENCOUNTER — Ambulatory Visit (HOSPITAL_COMMUNITY): Payer: Medicare Other | Admitting: Psychiatry

## 2019-03-01 ENCOUNTER — Telehealth (HOSPITAL_COMMUNITY): Payer: Self-pay | Admitting: Psychiatry

## 2019-03-01 NOTE — Telephone Encounter (Signed)
The patient states that she had loss of her family member today. She would contact back for rescheduling her appointment.

## 2019-04-12 ENCOUNTER — Other Ambulatory Visit: Payer: Self-pay

## 2019-04-12 ENCOUNTER — Encounter: Payer: Self-pay | Admitting: Cardiology

## 2019-04-12 ENCOUNTER — Ambulatory Visit (INDEPENDENT_AMBULATORY_CARE_PROVIDER_SITE_OTHER): Payer: Medicare Other | Admitting: Cardiology

## 2019-04-12 VITALS — BP 158/80 | HR 62 | Temp 96.9°F | Ht 62.0 in | Wt 168.0 lb

## 2019-04-12 DIAGNOSIS — R0789 Other chest pain: Secondary | ICD-10-CM | POA: Diagnosis not present

## 2019-04-12 DIAGNOSIS — R002 Palpitations: Secondary | ICD-10-CM

## 2019-04-12 DIAGNOSIS — I10 Essential (primary) hypertension: Secondary | ICD-10-CM

## 2019-04-12 MED ORDER — CHLORTHALIDONE 25 MG PO TABS: 12.5000 mg | ORAL_TABLET | Freq: Every day | ORAL | 3 refills | Status: DC

## 2019-04-12 NOTE — Progress Notes (Signed)
Clinical Summary [name] is a 53 y.o.female seen today for follow up of the following medical problems.    1. Chest pain -self reported history of MI in 2014at [facility]. Had cath, per report she reports there was no significant blockagesand was managed medically  - [facility] notes mention normal cath, anomalous LCX. Thought could be vasospasm, had been managed on norvasc. - no recent chest pain.    - denies any recent ches tpain.   2. HTN - compliant with meds  - checks at home 2 times daily, typically 140s/70s.  - she stopped norvasc due to stomach discomfort, off clonidine patch.   3. Palpitations - feeling of heart fluttering. Can be painful. Occurs daily, lasts just a few seconds - can occur at rest or with exertion. Can feel lightheaded, +SOB - coffee x 2-3, 4-5 mountain dew cans, rare, no EtOH - ongoing for a few years, increase in frequency.  - TSH 1.46,   - 09/2017 holter with isolated PVCs and PACs overall rate.  - has been cutting back on caffeine. Coffee 1-2 cups, sodas 3 per day. - symptoms have improved.  - now off lopressor due to low heart rates, had been on. Bradycardia to 40s in ER.   - can have palpitations at times. Occurs daily, lasts 3-5 minutes. Can have some SOB. Pulse can be elevated. Still with high caffeine intake.   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 Anxiety   . Arthritis    "pretty much all over; mainly neck and back" (12/26/2015)  . Asthma   . Bipolar 1 disorder ([place])   . Bipolar disorder ([place])   . Cervical cancer ([place])    "stage I"  . Chronic back pain   . Chronic bronchitis ([place])   . COPD (chronic obstructive pulmonary disease) ([place])   . Degenerative disc disease   . Depression   . Dysrhythmia   . Fibromyalgia   . GERD (gastroesophageal reflux disease)   . Hepatic cyst   . High  cholesterol   . History of hiatal hernia   . Hypertension 08/04/2012  . IBS (irritable bowel syndrome) Diagnosed November 2012  . Incisional hernia with gangrene and obstruction 12/26/2015  . Kidney stones    "passed them"  . Melanoma of back ([place])   . Ovarian cancer ([place])    "stage II"  . Plantar fasciitis, bilateral   . Polycystic kidney disease    ruled out  . Sleep apnea    cpap coming  "soon" (12/26/2015)     Allergies  Allergen Reactions  . Barium-Containing Compounds Other (See Comments)    Stomach cramps, extreme diarrhea, and vomiting  . Bee Venom Anaphylaxis  . Flu Virus Vaccine Swelling    Trouble swallowing Trouble swallowing   . Seldane [Terfenadine] Nausea And Vomiting and Other (See Comments)    CAUSES TOP LAYER OF SKIN TO PEEL  . Sulfa Antibiotics Anaphylaxis  . Lisinopril Cough  . Lithium Other (See Comments)    Agitation and hostility  . Tetracyclines & Related Hives and Nausea And Vomiting  . Trazodone And Nefazodone     Sick  . Zinc Nausea And Vomiting  . Aspirin Rash and Other (See Comments)    Upset stomach  . Ativan [Lorazepam] Other (See Comments)    Irritability  .  Erythromycin Nausea And Vomiting, Rash and Other (See Comments)    Cramps  . Lipitor [Atorvastatin] Nausea And Vomiting     Current Outpatient Medications  Medication Sig Dispense Refill  . albuterol (PROVENTIL HFA;VENTOLIN HFA) 108 (90 Base) MCG/ACT inhaler Inhale 1-2 puffs into the lungs every 6 (six) hours as needed for wheezing or shortness of breath. (Patient not taking: Reported on 08/25/2018) 1 Inhaler 1  . amLODipine (NORVASC) 10 MG tablet Take 1 tablet (10 mg total) by mouth daily. 90 tablet 3  . diclofenac (VOLTAREN) 75 MG EC tablet Take 1 tablet (75 mg total) by mouth 2 (two) times daily. 30 tablet 0  . fluticasone furoate-vilanterol (BREO ELLIPTA) 100-25 MCG/INH AEPB Inhale 1 puff into the lungs daily. (Patient not taking: Reported on 08/25/2018) 1 each 11  . lidocaine  (LIDODERM) 5 % Place 1 patch onto the skin daily. Remove & Discard patch within 12 hours or as directed by MD (Patient not taking: Reported on 09/21/2018) 30 patch 0  . losartan (COZAAR) 100 MG tablet Take 1 tablet (100 mg total) by mouth daily. 90 tablet 3  . lurasidone (LATUDA) 40 MG TABS tablet Take 1 tablet (40 mg total) by mouth daily with breakfast. 90 tablet 0  . methocarbamol (ROBAXIN) 500 MG tablet Take 1 tablet (500 mg total) by mouth 2 (two) times daily. (Patient not taking: Reported on 08/25/2018) 20 tablet 0  . oxyCODONE-acetaminophen (PERCOCET/ROXICET) 5-325 MG tablet Take 1 tablet by mouth every 8 (eight) hours as needed for severe pain. (Patient not taking: Reported on 08/25/2018) 4 tablet 0  . rosuvastatin (CRESTOR) 20 MG tablet Take 1 tablet (20 mg total) by mouth daily. 90 tablet 3  . solifenacin (VESICARE) 5 MG tablet Take 5 mg by mouth daily.     No current facility-administered medications for this visit.      Past Surgical History:  Procedure Laterality Date  . ABDOMINAL HYSTERECTOMY  1997  . APPENDECTOMY    . CARDIAC CATHETERIZATION  2015  . CARPAL TUNNEL RELEASE Right   . CESAREAN SECTION  1987  . CHOLECYSTECTOMY OPEN  1998  . COLONOSCOPY  November 2012  . EYE SURGERY     CATARACTS REMOVED 09/09/17 and 09/16/17  . HERNIA REPAIR    . INCISIONAL HERNIA REPAIR N/A 12/26/2015   Procedure: LAPAROSCOPIC REPAIR INCISIONAL HERNIA WITH MESH;  Surgeon: Fanny Skates, MD;  Location: [facility];  Service: General;  Laterality: N/A;  . INSERTION OF MESH N/A 12/26/2015   Procedure: INSERTION OF MESH;  Surgeon: Fanny Skates, MD;  Location: [facility];  Service: General;  Laterality: N/A;  . [place]  . LAPAROSCOPIC INCISIONAL / UMBILICAL / VENTRAL HERNIA REPAIR  12/26/2015   repair of incarcerated incisional hernia with mesh  . LIVER CYST REMOVAL  2014  . MELANOMA EXCISION  January 2013   removed from back  . SHOULDER SURGERY Left 2010   Humeral Head  Microfracture   . TUBAL LIGATION    . UPPER GASTROINTESTINAL ENDOSCOPY  November 2012     Allergies  Allergen Reactions  . Barium-Containing Compounds Other (See Comments)    Stomach cramps, extreme diarrhea, and vomiting  . Bee Venom Anaphylaxis  . Flu Virus Vaccine Swelling    Trouble swallowing Trouble swallowing   . Seldane [Terfenadine] Nausea And Vomiting and Other (See Comments)    CAUSES TOP LAYER OF SKIN TO PEEL  . Sulfa Antibiotics Anaphylaxis  . Lisinopril Cough  . Lithium Other (See Comments)  Agitation and hostility  . Tetracyclines & Related Hives and Nausea And Vomiting  . Trazodone And Nefazodone     Sick  . Zinc Nausea And Vomiting  . Aspirin Rash and Other (See Comments)    Upset stomach  . Ativan [Lorazepam] Other (See Comments)    Irritability  . Erythromycin Nausea And Vomiting, Rash and Other (See Comments)    Cramps  . Lipitor [Atorvastatin] Nausea And Vomiting      Family History  Adopted: Yes  Problem Relation Age of Onset  . Bipolar disorder Mother        never diagnosed but patient suspects mother had bipolar disorder.Marland KitchenMarland KitchenMarland KitchenMarland Kitchenpt was adopeted, only knew birth mother for a short period of time.  Marland Kitchen Anxiety disorder Mother   . Cirrhosis Mother   . Diabetes Mother   . Turner syndrome Daughter   . Cancer Daughter   . Bipolar disorder Daughter   . Interstitial cystitis Daughter   . ADD / ADHD Neg Hx   . Alcohol abuse Neg Hx   . Drug abuse Neg Hx   . Dementia Neg Hx   . Depression Neg Hx   . OCD Neg Hx   . Paranoid behavior Neg Hx   . Schizophrenia Neg Hx   . Seizures Neg Hx   . Sexual abuse Neg Hx   . Physical abuse Neg Hx   . Colon cancer Neg Hx      Social History [name] reports that she quit smoking about 10 months ago. Her smoking use included cigarettes. She smoked 0.00 packs per day for 0.00 years. She has never used smokeless tobacco. [name] reports no history of alcohol use.   Review of Systems CONSTITUTIONAL: No  weight loss, fever, chills, weakness or fatigue.  HEENT: Eyes: No visual loss, blurred vision, double vision or yellow sclerae.No hearing loss, sneezing, congestion, runny nose or sore throat.  SKIN: No rash or itching.  CARDIOVASCULAR: per hpi RESPIRATORY: No shortness of breath, cough or sputum.  GASTROINTESTINAL: No anorexia, nausea, vomiting or diarrhea. No abdominal pain or blood.  GENITOURINARY: No burning on urination, no polyuria NEUROLOGICAL: No headache, dizziness, syncope, paralysis, ataxia, numbness or tingling in the extremities. No change in bowel or bladder control.  MUSCULOSKELETAL: No muscle, back pain, joint pain or stiffness.  LYMPHATICS: No enlarged nodes. No history of splenectomy.  PSYCHIATRIC: No history of depression or anxiety.  ENDOCRINOLOGIC: No reports of sweating, cold or heat intolerance. No polyuria or polydipsia.  Marland Kitchen   Physical Examination Today's Vitals   04/12/19 1101  BP: (!) 158/80  Pulse: 62  Temp: (!) 96.9 F (36.1 C)  SpO2: 98%  Weight: 168 lb (76.2 kg)  Height: 5\' 2"  (1.575 m)   Body mass index is 30.73 kg/m.  Gen: resting comfortably, no acute distress HEENT: no scleral icterus, pupils equal round and reactive, no palptable cervical adenopathy,  CV: RRR, no m/r/g, no jvd Resp: Clear to auscultation bilaterally GI: abdomen is soft, non-tender, non-distended, normal bowel sounds, no hepatosplenomegaly MSK: extremities are warm, no edema.  Skin: warm, no rash Neuro:  no focal deficits Psych: appropriate affect   Diagnostic Studies  09/2017 holter  48 hr holter monitor  Rare ventricular ectopy, all in the form of isolated PVCs  Rare supraventricular ectopy, primarily in the form of PACs.  Min HR 45, Max HR 121, Avg HR 86  No diary submitted  No significant arrhythmias   Assessment and Plan   1. Chest pain - we will  continue to monitor at this time, no recent symptoms  2. HTN - above goal. Medication side effects as  reported above. Start chlorthalidone 12.5mg  daily, check BMET/Mg in 2 weeks  3. Palpitations - prior monitor with just PACs and PVCs - bradycardia on av nodal agents previously - ongoing high caffeine intake, work to wean caffeine intake and monitor symptoms.   Fu 1 year   Arnoldo Lenis, M.D.,

## 2019-04-12 NOTE — Patient Instructions (Addendum)
Your physician wants you to follow-up in: [place] will receive a reminder letter in the mail two months in advance. If you don't receive a letter, please call our office to schedule the follow-up appointment.  Your physician has recommended you make the following change in your medication:   START CHLORTHALIDONE 12.5 MG (1/2 TABLET) DAILY    Your physician recommends that you return for lab work 2 WEEKS BMP/MG  PLEASE RECORD BLOOD PRESSURE FOR 3 WEEKS AND CALL us WITH READINGS   Thank you for choosing [facility]!!

## 2019-04-13 ENCOUNTER — Ambulatory Visit (INDEPENDENT_AMBULATORY_CARE_PROVIDER_SITE_OTHER): Payer: Medicare Other | Admitting: Family Medicine

## 2019-04-13 VITALS — BP 170/91 | HR 84 | Temp 98.5°F | Ht 62.0 in | Wt 168.4 lb

## 2019-04-13 DIAGNOSIS — E559 Vitamin D deficiency, unspecified: Secondary | ICD-10-CM

## 2019-04-13 DIAGNOSIS — G8929 Other chronic pain: Secondary | ICD-10-CM

## 2019-04-13 DIAGNOSIS — M5441 Lumbago with sciatica, right side: Secondary | ICD-10-CM | POA: Diagnosis not present

## 2019-04-13 DIAGNOSIS — I10 Essential (primary) hypertension: Secondary | ICD-10-CM

## 2019-04-13 NOTE — Patient Instructions (Addendum)
Vit d level-will call when results are available  Labwork for starting chlorthalidone by cardiology  Monitor blood pressure at home-first thing in the morning  Pt to schedule mammogram  Refer to pain management for evaluation neck and back pain
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
## 2019-04-20 DIAGNOSIS — E559 Vitamin D deficiency, unspecified: Secondary | ICD-10-CM | POA: Insufficient documentation

## 2019-04-21 ENCOUNTER — Encounter (HOSPITAL_COMMUNITY): Payer: Self-pay | Admitting: Psychiatry

## 2019-04-21 ENCOUNTER — Ambulatory Visit (INDEPENDENT_AMBULATORY_CARE_PROVIDER_SITE_OTHER): Payer: Medicare Other | Admitting: Psychiatry

## 2019-04-21 ENCOUNTER — Other Ambulatory Visit: Payer: Self-pay

## 2019-04-21 DIAGNOSIS — F4310 Post-traumatic stress disorder, unspecified: Secondary | ICD-10-CM

## 2019-04-21 DIAGNOSIS — F3130 Bipolar disorder, current episode depressed, mild or moderate severity, unspecified: Secondary | ICD-10-CM

## 2019-04-21 MED ORDER — HYDROXYZINE HCL 25 MG PO TABS: 25.0000 mg | ORAL_TABLET | Freq: Every day | ORAL | 1 refills | Status: DC | PRN

## 2019-04-21 MED ORDER — ARIPIPRAZOLE 10 MG PO TABS: ORAL_TABLET | ORAL | 1 refills | Status: DC

## 2019-04-21 NOTE — Patient Instructions (Signed)
1. Start Abilify 5 mg daily for one week, then 10 mg daily  2. Start hydroxyzine 25 mg daily as needed for anxiety, insomnia 3. Next appointment: 11/11 at 9 AM

## 2019-05-03 ENCOUNTER — Ambulatory Visit (INDEPENDENT_AMBULATORY_CARE_PROVIDER_SITE_OTHER): Payer: Medicare Other | Admitting: Family Medicine

## 2019-05-03 ENCOUNTER — Other Ambulatory Visit (HOSPITAL_COMMUNITY)
Admission: RE | Admit: 2019-05-03 | Discharge: 2019-05-03 | Disposition: A | Discharge status: Home / Self Care | Payer: Medicare Other | Source: Ambulatory Visit | Attending: Cardiology | Admitting: Cardiology

## 2019-05-03 ENCOUNTER — Other Ambulatory Visit: Payer: Self-pay

## 2019-05-03 VITALS — BP 157/79 | HR 81 | Temp 98.0°F | Ht 62.0 in | Wt 167.8 lb

## 2019-05-03 DIAGNOSIS — J0140 Acute pansinusitis, unspecified: Secondary | ICD-10-CM | POA: Insufficient documentation

## 2019-05-03 DIAGNOSIS — I10 Essential (primary) hypertension: Secondary | ICD-10-CM | POA: Insufficient documentation

## 2019-05-03 HISTORY — DX: Acute pansinusitis, unspecified: J01.40

## 2019-05-03 MED ORDER — AMOXICILLIN 875 MG PO TABS: 875.0000 mg | ORAL_TABLET | Freq: Two times a day (BID) | ORAL | 0 refills | Status: DC
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
## 2019-05-03 NOTE — Patient Instructions (Addendum)
flonase Nasal saline Amoxil-rx mucinex -600mg  twice a day Tylenol 500mg -max 6/day
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
## 2019-05-10 ENCOUNTER — Telehealth: Payer: Self-pay | Admitting: Family Medicine

## 2019-05-10 NOTE — Telephone Encounter (Signed)
Patient would like a referral for mammogram to [facility] instead of [facility].

## 2019-05-10 NOTE — Telephone Encounter (Signed)
Spoke to [place] and she is aware to go to [facility]

## 2019-05-13 ENCOUNTER — Encounter (HOSPITAL_COMMUNITY): Payer: Self-pay | Admitting: Psychiatry

## 2019-05-13 ENCOUNTER — Ambulatory Visit (INDEPENDENT_AMBULATORY_CARE_PROVIDER_SITE_OTHER): Payer: Medicare Other | Admitting: Psychiatry

## 2019-05-13 ENCOUNTER — Other Ambulatory Visit: Payer: Self-pay

## 2019-05-13 DIAGNOSIS — F3130 Bipolar disorder, current episode depressed, mild or moderate severity, unspecified: Secondary | ICD-10-CM

## 2019-05-13 DIAGNOSIS — F4310 Post-traumatic stress disorder, unspecified: Secondary | ICD-10-CM | POA: Diagnosis not present

## 2019-05-13 MED ORDER — ARIPIPRAZOLE 10 MG PO TABS: 10.0000 mg | ORAL_TABLET | Freq: Every day | ORAL | 1 refills | Status: DC

## 2019-05-13 NOTE — Patient Instructions (Signed)
1. Continue Abilify 10 mg daily 2. Continue hydroxyzine 25 mg daily as needed for anxiety 3. Next appointment: in January

## 2019-06-02 ENCOUNTER — Encounter: Payer: Self-pay | Admitting: Student in an Organized Health Care Education/Training Program

## 2019-06-02 ENCOUNTER — Ambulatory Visit (HOSPITAL_BASED_OUTPATIENT_CLINIC_OR_DEPARTMENT_OTHER): Payer: Medicare Other | Admitting: Student in an Organized Health Care Education/Training Program

## 2019-06-02 ENCOUNTER — Ambulatory Visit
Admission: RE | Admit: 2019-06-02 | Discharge: 2019-06-02 | Disposition: A | Discharge status: Home / Self Care | Payer: Medicare Other | Attending: Student in an Organized Health Care Education/Training Program | Admitting: Student in an Organized Health Care Education/Training Program

## 2019-06-02 ENCOUNTER — Other Ambulatory Visit: Payer: Self-pay

## 2019-06-02 ENCOUNTER — Ambulatory Visit
Admission: RE | Admit: 2019-06-02 | Discharge: 2019-06-02 | Disposition: A | Discharge status: Home / Self Care | Payer: Medicare Other | Source: Ambulatory Visit | Attending: Student in an Organized Health Care Education/Training Program | Admitting: Student in an Organized Health Care Education/Training Program

## 2019-06-02 VITALS — BP 174/81 | Temp 97.1°F | Resp 18 | Ht 62.0 in | Wt 166.0 lb

## 2019-06-02 DIAGNOSIS — G894 Chronic pain syndrome: Secondary | ICD-10-CM | POA: Insufficient documentation

## 2019-06-02 DIAGNOSIS — Z9889 Other specified postprocedural states: Secondary | ICD-10-CM | POA: Insufficient documentation

## 2019-06-02 DIAGNOSIS — M25512 Pain in left shoulder: Secondary | ICD-10-CM

## 2019-06-02 DIAGNOSIS — G8929 Other chronic pain: Secondary | ICD-10-CM

## 2019-06-02 DIAGNOSIS — M5441 Lumbago with sciatica, right side: Secondary | ICD-10-CM | POA: Insufficient documentation

## 2019-06-02 DIAGNOSIS — M533 Sacrococcygeal disorders, not elsewhere classified: Secondary | ICD-10-CM | POA: Diagnosis present

## 2019-06-02 DIAGNOSIS — M25511 Pain in right shoulder: Secondary | ICD-10-CM | POA: Insufficient documentation

## 2019-06-02 DIAGNOSIS — M797 Fibromyalgia: Secondary | ICD-10-CM | POA: Insufficient documentation

## 2019-06-02 DIAGNOSIS — M19012 Primary osteoarthritis, left shoulder: Secondary | ICD-10-CM

## 2019-06-02 MED ORDER — TIZANIDINE HCL 4 MG PO TABS: 4.0000 mg | ORAL_TABLET | Freq: Two times a day (BID) | ORAL | 1 refills | Status: DC | PRN

## 2019-06-02 MED ORDER — CELECOXIB 100 MG PO CAPS: ORAL_CAPSULE | ORAL | 0 refills | Status: DC

## 2019-06-02 NOTE — Progress Notes (Signed)
Safety precautions to be maintained throughout the outpatient stay will include: orient to surroundings, keep bed in low position, maintain call bell within reach at all times, provide assistance with transfer out of bed and ambulation.  

## 2019-06-02 NOTE — Progress Notes (Signed)
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 incidence by 68%  35-39.9 kg/m2 Severe obesity (Class II) Increased incidence by 136%  >40 kg/m2 Extreme obesity (Class III) Increased incidence by 254%   Patient's current BMI Ideal Body weight  Body mass index is 30.36 kg/m. Ideal body weight: 50.1 kg (110 lb 7.2 oz) Adjusted ideal body weight: 60.2 kg (132 lb 10.7 oz)   BMI Readings from Last 4 Encounters:  06/02/19 30.36 kg/m  05/03/19 30.69 kg/m  04/13/19 30.80 kg/m  04/12/19 30.73 kg/m   Wt Readings from Last 4 Encounters:  06/02/19 166 lb (75.3 kg)  05/03/19 167 lb 12.8 oz (76.1 kg)  04/13/19 168 lb 6.4 oz (76.4 kg)  04/12/19 168 lb (76.2 kg)  Psych/Mental status: Alert, oriented x 3 (person, place, & time)       Eyes: PERLA Respiratory: No evidence of acute respiratory distress  Cervical Spine Area Exam  Skin & Axial Inspection: No masses, redness, edema, swelling, or associated skin  lesions Alignment: Symmetrical Functional ROM: Pain restricted ROM      Stability: No instability detected Muscle Tone/Strength: Functionally intact. No obvious neuro-muscular anomalies detected. Sensory (Neurological): Musculoskeletal pain pattern Palpation: No palpable anomalies              Upper Extremity (UE) Exam    Side: Right upper extremity  Side: Left upper extremity  Skin & Extremity Inspection: Skin color, temperature, and hair growth are WNL. No peripheral edema or cyanosis. No masses, redness, swelling, asymmetry, or associated skin lesions. No contractures.  Skin & Extremity Inspection: Evidence of prior arthroplastic surgery  Functional ROM: Pain restricted ROM for shoulder  Functional ROM: Pain restricted ROM for shoulder  Muscle Tone/Strength: Functionally intact. No obvious neuro-muscular anomalies detected.  Muscle Tone/Strength: Functionally intact. No obvious neuro-muscular anomalies detected.  Sensory (Neurological): Arthropathic arthralgia          Sensory (Neurological): Arthropathic arthralgia          Palpation: No palpable anomalies              Palpation: No palpable anomalies              Provocative Test(s):  Phalen's test: deferred Tinel's test: deferred Apley's scratch test (touch opposite shoulder):  Action 1 (Across chest): Decreased ROM Action 2 (Overhead): Decreased ROM Action 3 (LB reach): Decreased ROM   Provocative Test(s):  Phalen's test: deferred Tinel's test: deferred Apley's scratch test (touch opposite shoulder):  Action 1 (Across chest): Decreased ROM Action 2 (Overhead): Decreased ROM Action 3 (LB reach): Decreased ROM     Thoracic Spine Area Exam  Skin & Axial Inspection: No masses, redness, or swelling Alignment: Symmetrical Functional ROM: Unrestricted ROM Stability: No instability detected Muscle Tone/Strength: Functionally intact. No obvious neuro-muscular anomalies detected. Sensory (Neurological): Unimpaired Muscle strength  & Tone: No palpable anomalies  Lumbar Spine Area Exam  Skin & Axial Inspection: No masses, redness, or swelling Alignment: Symmetrical Functional ROM: Unrestricted ROM       Stability: No instability detected Muscle Tone/Strength: Functionally intact. No obvious neuro-muscular anomalies detected. Sensory (Neurological): Dermatomal pain pattern R L4/5 Palpation: No palpable anomalies       Provocative Tests: Hyperextension/rotation test: (+) bilaterally for facet joint pain. Lumbar quadrant test (Kemp's test): (+) on the right for foraminal stenosis Lateral bending test: (+) ipsilateral radicular pain, on the right.  Positive for right-sided foraminal stenosis. Patrick's Maneuver: (+) for bilateral S-I arthralgia             FABER* test: (+) for bilateral S-I arthralgia             S-I anterior distraction/compression test: deferred today         S-I lateral compression test: deferred today         S-I Thigh-thrust test: deferred today         S-I Gaenslen's test: deferred today         *(Flexion, ABduction and External Rotation)  Gait & Posture Assessment  Ambulation: Unassisted Gait: Relatively normal for age and body habitus Posture: WNL   Lower Extremity Exam    Side: Right lower extremity  Side: Left lower extremity  Stability: No instability observed          Stability: No instability observed          Skin & Extremity Inspection: Skin color, temperature, and hair growth are WNL. No peripheral edema or cyanosis. No masses, redness, swelling, asymmetry, or associated skin lesions. No contractures.  Skin & Extremity Inspection: Skin color, temperature, and hair growth are WNL. No peripheral edema or cyanosis. No masses, redness, swelling, asymmetry, or associated skin lesions. No contractures.  Functional ROM: Unrestricted ROM                  Functional ROM: Unrestricted ROM                  Muscle Tone/Strength: Functionally intact. No obvious neuro-muscular anomalies detected.   Muscle Tone/Strength: Functionally intact. No obvious neuro-muscular anomalies detected.  Sensory (Neurological): Dermatomal pain pattern        Sensory (Neurological): Unimpaired        DTR: Patellar: 2+: normal Achilles: deferred today Plantar: deferred today  DTR: Patellar: 2+: normal Achilles: deferred today Plantar: deferred today  Palpation: No palpable anomalies  Palpation: No palpable anomalies   Assessment  Primary Diagnosis & Pertinent Problem List: The primary encounter diagnosis was Chronic right-sided low back pain with right-sided sciatica. Diagnoses of Chronic pain of both shoulders, Primary osteoarthritis of left shoulder, H/O repair of left rotator cuff, Sacroiliac joint pain, Chronic pain syndrome, and Fibromyalgia were also pertinent to this visit.  Visit Diagnosis (New problems to examiner): 1. Chronic right-sided low back pain with right-sided sciatica   2. Chronic pain of both shoulders   3. Primary osteoarthritis of left shoulder   4. H/O repair of left rotator cuff   5. Sacroiliac joint pain   6. Chronic pain syndrome   7. Fibromyalgia    Plan of Care (Initial workup plan)   I had extensive discussion with the patient about the goals of pain management.  We discussed nonpharmacological approaches to pain management that include physical therapy, dieting, sleep hygiene, psychotherapy, interventional therapy.  We discussed the importance of understanding the type of pain including neuropathic, nociceptive, centralized.  I also stressed the importance of multimodal analgesia with an emphasis on nondrug modalities including self management, behavioral health support and physical therapy.  We discussed the importance of physical therapy and how a individualized physical therapy and occupational therapy program tailored to patient limitations can be helpful at improving physical function. We also discussed the importance of insomnia and disrupted sleep and how improved  sleep hygiene and cognitive therapy could be helpful.  Psychotherapy including CBT, mind-body therapies, pain coping strategies can be helpful for patients whose pain impacts mood,  sleep, quality of life, relationships with others.  We discussed avoiding benzodiazepines.  I also had an extensive discussion with the patient about interventional therapies which is my expertise and how these could be incorporated into an effective multimodal pain management plan.    Imaging Orders     DG Shoulder Right     DG Shoulder Left     DG Sacroiliac Joint X-Rays  Referral Orders     AMB PT Referral (2-3x/wk, x 6wks) Pharmacotherapy (current): Medications ordered:  Meds ordered this encounter  Medications  . celecoxib (CELEBREX) 100 MG capsule    Sig: Take 1 capsule (100 mg total) by mouth 2 (two) times daily for 30 days, THEN 1 capsule (100 mg total) daily.    Dispense:  90 capsule    Refill:  0  . tiZANidine (ZANAFLEX) 4 MG tablet    Sig: Take 1 tablet (4 mg total) by mouth every 12 (twelve) hours as needed for muscle spasms.    Dispense:  60 tablet    Refill:  1    Do not place this medication, or any other prescription from our practice, on "Automatic Refill". Patient may have prescription filled one day early if pharmacy is closed on scheduled refill date.   Medications administered during this visit: [name] "Lovey Newcomer" had no medications administered during this visit.   Pharmacological management options:  Opioid Analgesics: N/A. avoid  Membrane stabilizer: Has tried Lyrica and Cymbalta which resulted in GI upset.  Has tried gabapentin dose of 2400 mg which not effective.  Muscle relaxant: Flexeril in the past.  Not effective.  Trial of tizanidine as below  NSAID: Celebrex as above  Other analgesic(s): To be determined at a later time   Interventional management options: [name] was informed that there is no guarantee that she would be a candidate for interventional therapies.  The decision will be based on the results of diagnostic studies, as well as [name]'s risk profile.  Procedure(s) under consideration:  Pending imaging Suprascapular nerve block under fluoroscopy Glenohumeral shoulder steroid injection Lumbar epidural steroid injection   Provider-requested follow-up: Return in about 4 weeks (around 06/30/2019) for After Imaging, Medication Management.  Future Appointments  Date Time Provider [place]  06/29/2019  2:30 PM Gillis Santa, MD [facility] None  08/12/2019 10:00 AM Norman Clay, MD [facility] None  10/11/2019  9:40 AM Cor[place], Rex Kras, MD [facility] None    Primary Care Physician: Maryruth Hancock, MD Location: [facility] Note by: Gillis Santa, MD Date: 06/02/2019; Time: 1:38 PM  Note: This dictation was prepared with Dragon dictation. Any transcriptional errors that may result from this process are unintentional.

## 2019-06-02 NOTE — Patient Instructions (Addendum)
Prescriptions for Celebrex and Zanaflex have been sent to your pharmacy.

## 2019-06-08 ENCOUNTER — Ambulatory Visit (HOSPITAL_COMMUNITY): Payer: Medicare Other | Admitting: Psychiatry

## 2019-06-16 ENCOUNTER — Encounter (HOSPITAL_COMMUNITY): Payer: Self-pay | Admitting: Physical Therapy

## 2019-06-16 ENCOUNTER — Ambulatory Visit (HOSPITAL_COMMUNITY)
Payer: Medicare Other | Attending: Student in an Organized Health Care Education/Training Program | Admitting: Physical Therapy

## 2019-06-16 ENCOUNTER — Other Ambulatory Visit: Payer: Self-pay

## 2019-06-16 DIAGNOSIS — G8929 Other chronic pain: Secondary | ICD-10-CM | POA: Diagnosis present

## 2019-06-16 DIAGNOSIS — M542 Cervicalgia: Secondary | ICD-10-CM

## 2019-06-16 DIAGNOSIS — R2689 Other abnormalities of gait and mobility: Secondary | ICD-10-CM

## 2019-06-16 DIAGNOSIS — M5441 Lumbago with sciatica, right side: Secondary | ICD-10-CM | POA: Insufficient documentation

## 2019-06-16 DIAGNOSIS — M6281 Muscle weakness (generalized): Secondary | ICD-10-CM | POA: Diagnosis present
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
## 2019-06-21 ENCOUNTER — Telehealth (HOSPITAL_COMMUNITY): Payer: Self-pay | Admitting: Physical Therapy

## 2019-06-21 ENCOUNTER — Encounter (HOSPITAL_COMMUNITY): Payer: Medicare Other | Admitting: Physical Therapy

## 2019-06-21 NOTE — Telephone Encounter (Signed)
Called regarding patient not showing for appointment this morning. Patient reported that she had forgotten and that she had not slept well the previous night. Reminded of next scheduled appointment.    Clarene Critchley PT, DPT 10:24 AM, 06/21/19 [phone]

## 2019-06-27 ENCOUNTER — Telehealth (HOSPITAL_COMMUNITY): Payer: Self-pay

## 2019-06-27 NOTE — Telephone Encounter (Signed)
She has to go with her [name]' to the MD and can not come in today

## 2019-06-28 ENCOUNTER — Encounter: Payer: Self-pay | Admitting: Student in an Organized Health Care Education/Training Program

## 2019-06-28 ENCOUNTER — Encounter (HOSPITAL_COMMUNITY): Payer: Medicare Other

## 2019-06-29 ENCOUNTER — Ambulatory Visit
Payer: Medicare Other | Attending: Student in an Organized Health Care Education/Training Program | Admitting: Student in an Organized Health Care Education/Training Program

## 2019-06-29 ENCOUNTER — Telehealth (HOSPITAL_COMMUNITY): Payer: Self-pay | Admitting: Physical Therapy

## 2019-06-29 ENCOUNTER — Encounter: Payer: Self-pay | Admitting: Student in an Organized Health Care Education/Training Program

## 2019-06-29 ENCOUNTER — Other Ambulatory Visit: Payer: Self-pay

## 2019-06-29 DIAGNOSIS — M5441 Lumbago with sciatica, right side: Secondary | ICD-10-CM | POA: Diagnosis not present

## 2019-06-29 DIAGNOSIS — M47816 Spondylosis without myelopathy or radiculopathy, lumbar region: Secondary | ICD-10-CM

## 2019-06-29 DIAGNOSIS — Z9889 Other specified postprocedural states: Secondary | ICD-10-CM | POA: Diagnosis not present

## 2019-06-29 DIAGNOSIS — G8929 Other chronic pain: Secondary | ICD-10-CM

## 2019-06-29 DIAGNOSIS — M25511 Pain in right shoulder: Secondary | ICD-10-CM | POA: Diagnosis not present

## 2019-06-29 DIAGNOSIS — M25512 Pain in left shoulder: Secondary | ICD-10-CM

## 2019-06-29 DIAGNOSIS — G894 Chronic pain syndrome: Secondary | ICD-10-CM

## 2019-06-29 DIAGNOSIS — M533 Sacrococcygeal disorders, not elsewhere classified: Secondary | ICD-10-CM

## 2019-06-29 MED ORDER — TIZANIDINE HCL 4 MG PO TABS: 4.0000 mg | ORAL_TABLET | Freq: Two times a day (BID) | ORAL | 5 refills | Status: AC | PRN

## 2019-06-29 NOTE — Addendum Note (Signed)
Addended by: Gillis Santa on: 06/29/2019 03:26 PM   Modules accepted: Orders

## 2019-06-29 NOTE — Progress Notes (Addendum)
Pain Management Virtual Encounter Note - Virtual Visit via [place] (real-time audio visits between healthcare provider and patient).   Patient's Phone No. & Preferred Pharmacy:  [phone] (home); [phone] (mobile); (Preferred) [phone] [email or web address]  [facility] - [place], [address] [place] [address] Phone: [phone] Fax: [phone]    Pre-screening note:  Our staff contacted [name] and offered [name] an "in person", "face-to-face" appointment versus a telephone encounter. [name] indicated preferring the telephone encounter, at this time.   Reason for Virtual Visit: COVID-19*  Social distancing based on CDC and AMA recommendations.   I contacted [name] on 06/29/2019 via video conference.      I clearly identified myself as Gillis Santa, MD. I verified that I was speaking with the correct person using two identifiers (Name: [name], and date of birth: 21-Mar-1966).  Advanced Informed Consent I sought verbal advanced consent from [name] for virtual visit interactions. I informed [name] of possible security and privacy concerns, risks, and limitations associated with providing "not-in-person" medical evaluation and management services. I also informed [name] of the availability of "in-person" appointments. Finally, I informed [name] that there would be a charge for the virtual visit and that [name] could be  personally, fully or partially, financially responsible for it. [name] expressed understanding and agreed to proceed.   Historic Elements   [name] is a 53 y.o. year old, female patient evaluated today after [name] last encounter by our practice on 06/02/2019. [name]  has a past medical history of Anxiety, Arthritis, Asthma, Bipolar 1 disorder ([place]), Bipolar disorder ([place]), Cervical cancer ([place]), Chronic back pain, Chronic bronchitis ([place]), COPD (chronic obstructive  pulmonary disease) ([place]), Degenerative disc disease, Depression, Dysrhythmia, Fibromyalgia, GERD (gastroesophageal reflux disease), Hepatic cyst, High cholesterol, History of hiatal hernia, Hypertension (08/04/2012), IBS (irritable bowel syndrome) (Diagnosed November 2012), Incisional hernia with gangrene and obstruction (12/26/2015), Melanoma of back ([place]), Ovarian cancer ([place]), Plantar fasciitis, bilateral, and Sleep apnea. [name] also  has a past surgical history that includes Shoulder surgery (Left, 2010); Abdominal hysterectomy (1997); Laparoscopic abdominal exploration (1997); Cesarean section (1987); Appendectomy; Upper gastrointestinal endoscopy (November 2012); Colonoscopy (November 2012); Melanoma excision (January 2013); Tubal ligation; Carpal tunnel release (Right); Liver cyst removal (2014); Hernia repair; Laparoscopic incisional / umbilical / ventral hernia repair (12/26/2015); Cholecystectomy open (1998); Cardiac catheterization (2015); Incisional hernia repair (N/A, 12/26/2015); Insertion of mesh (N/A, 12/26/2015); and Eye surgery. [name] has a current medication list which includes the following prescription(s): aripiprazole, chlorthalidone, epinephrine, hydroxyzine, losartan, and tizanidine. [name]  reports that [name] quit smoking about 12 months ago. [name] smoking use included cigarettes. [name] smoked 0.00 packs per day for 0.00 years. [name] uses smokeless tobacco. [name] reports that [name] does not drink alcohol or use drugs. [name] is allergic to barium-containing compounds; bee venom; flu virus vaccine; seldane [terfenadine]; sulfa antibiotics; lisinopril; lithium; tetracyclines & related; celebrex [celecoxib]; trazodone and nefazodone; zinc; aspirin; ativan [lorazepam]; erythromycin; and lipitor [atorvastatin].   HPI  Today, [name] is being contacted for follow-up evaluation  Second visit with me, virtual.  Patient continues to endorse low back and buttock pain that is worse with spinal extension.  We  reviewed [name] radiographic imaging as below.  Largely unremarkable.  No evidence of significant arthropathy or fracture.  Previous lumbar MRI does show lumbar facet disease at L4-L5 and L5-S1, mild.  We discussed diagnostic lumbar facet medial branch nerve blocks bilaterally at  L3, L4, L5, S1.  Risks and benefits were discussed in detail and patient would like to trial.  Regards to medication management, we are focusing on nonopioid-based therapy given the patient's psychiatric history, bipolar 1 disorder.  [name] has tried and failed gabapentin, Lyrica, Cymbalta.  [name] is finding benefit with tizanidine 4 mg twice daily as needed.  [name] did have side effects with Celebrex including GI upset and has since discontinued the medication.  Encouraged [name] to remain off this medication.  Patient endorsed understanding.  Laboratory Chemistry Profile (12 mo)  Renal: 12/28/2018: BUN 8; Creatinine 0.8  Lab Results  Component Value Date   GFRAA >60 12/08/2017   GFRNONAA >60 12/08/2017   Hepatic: No results found for requested labs within last 8760 hours. Lab Results  Component Value Date   AST 21 12/28/2018   ALT 18 12/28/2018   Other: 12/28/2018: Vit D, 25-Hydroxy 18.9 Note: Above Lab results reviewed.  Imaging  DG Sacroiliac Joint X-Rays CLINICAL DATA:  Chronic sacroiliac pain.  EXAM: BILATERAL SACROILIAC JOINTS - 3+ VIEW  COMPARISON:  CT 06/18/2018  FINDINGS: Both sacroiliac joints appear normal. No evidence degenerative or erosive arthropathy. No evidence of ankylosis. The patient does have some vacuum phenomenon of the sacroiliac joints as also seen at the previous CT.  IMPRESSION: No evidence of degenerative or erosive arthropathy. The patient does have vacuum phenomenon of the joints as seen at the previous CT. This is likely to be incidental and insignificant in the absence of other pathologic joint findings.  Electronically Signed   By: Nelson Chimes M.D.   On: 06/02/2019 19:54 DG  Shoulder Left CLINICAL DATA:  Chronic bilateral shoulder pain.  EXAM: LEFT SHOULDER - 2+ VIEW  COMPARISON:  03/13/2015.  FINDINGS: Normal radiographs.  IMPRESSION: Negative.  Electronically Signed   By: Nelson Chimes M.D.   On: 06/02/2019 19:50 DG Shoulder Right CLINICAL DATA:  Chronic bilateral shoulder pain.  EXAM: RIGHT SHOULDER - 2+ VIEW  COMPARISON:  None.  FINDINGS: There is no evidence of fracture or dislocation. There is no evidence of arthropathy or other focal bone abnormality. Soft tissues are unremarkable.  IMPRESSION: Normal radiographs.  Electronically Signed   By: Nelson Chimes M.D.   On: 06/02/2019 19:49  Images were shared and reviewed with patient via [email or web address] screen share  Assessment  The primary encounter diagnosis was Lumbar facet arthropathy (L4/5 and L5/S1). Diagnoses of Chronic right-sided low back pain with right-sided sciatica, Chronic pain of both shoulders, H/O repair of left rotator cuff, Sacroiliac joint pain, and Chronic pain syndrome were also pertinent to this visit.  General Recommendations: The pain condition that the patient suffers from is best treated with a multidisciplinary approach that involves an increase in physical activity to prevent de-conditioning and worsening of the pain cycle, as well as psychological counseling (formal and/or informal) to address the co-morbid psychological affects of pain. Treatment will often involve judicious use of pain medications and interventional procedures to decrease the pain, allowing the patient to participate in the physical activity that will ultimately produce long-lasting pain reductions. The goal of the multidisciplinary approach is to return the patient to a higher level of overall function and to restore their ability to perform activities of daily living.  1. Lumbar facet arthropathy (L4/5 and L5/S1) - L-FCT Blk (Schedule); Future -PT/OT  2. Chronic pain of both shoulders -  tiZANidine (ZANAFLEX) 4 MG tablet; Take 1 tablet (4 mg total) by mouth every 12 (twelve) hours as needed for muscle  spasms.  Dispense: 60 tablet; Refill: 5  3. H/O repair of left rotator cuff -PT/OT   Plan of Care   I have discontinued [name]. Tammy "Sandy"'s amoxicillin, cyclobenzaprine, and celecoxib. I am also having [name] maintain [name] losartan, chlorthalidone, hydrOXYzine, ARIPiprazole, EPINEPHrine, and tiZANidine.  Pharmacotherapy (Medications Ordered): Meds ordered this encounter  Medications  . tiZANidine (ZANAFLEX) 4 MG tablet    Sig: Take 1 tablet (4 mg total) by mouth every 12 (twelve) hours as needed for muscle spasms.    Dispense:  60 tablet    Refill:  5    Do not place this medication, or any other prescription from our practice, on "Automatic Refill". Patient may have prescription filled one day early if pharmacy is closed on scheduled refill date.   Orders:  Orders Placed This Encounter  Procedures  . L-FCT Blk (Schedule)    Standing Status:   Future    Standing Expiration Date:   07/30/2019    Scheduling Instructions:     Side: Bilateral     Level: L3-4, L4-5, & L5-S1 Facets (L3, L4, L5, & S1 Medial Branch Nerves)     Sedation: with     Timeframe: ASAA    Order Specific Question:   Where will this procedure be performed?    Answer:   [facility]  . Ambulatory referral to Occupational Therapy    Referral Priority:   Routine    Referral Type:   Occupational Therapy    Referral Reason:   Specialty Services Required    Requested Specialty:   Occupational Therapy    Number of Visits Requested:   1   Follow-up plan:   Return in about 2 weeks (around 07/13/2019) for Procedure B/L L3, 4, 5 S1 Fcts, with sedation.    Recent Visits Date Type Provider Dept  06/02/19 Office Visit Gillis Santa, MD [facility]  Showing recent visits within past 90 days and meeting all other requirements   Today's Visits Date Type Provider Dept  06/29/19 Office  Visit Gillis Santa, MD [facility]  Showing today's visits and meeting all other requirements   Future Appointments Date Type Provider Dept  07/13/19 Appointment Gillis Santa, MD [facility]  Showing future appointments within next 90 days and meeting all other requirements   I discussed the assessment and treatment plan with the patient. The patient was provided an opportunity to ask questions and all were answered. The patient agreed with the plan and demonstrated an understanding of the instructions.  Patient advised to call back or seek an in-person evaluation if the symptoms or condition worsens.  Total duration of non-face-to-face encounter: 43minutes.  Note by: Gillis Santa, MD Date: 06/29/2019; Time: 3:26 PM  Note: This dictation was prepared with Dragon dictation. Any transcriptional errors that may result from this process are unintentional.  Disclaimer:  * Given the special circumstances of the COVID-19 pandemic, the federal government has announced that the Office for Civil Rights (OCR) will exercise its enforcement discretion and will not impose penalties on physicians using telehealth in the event of noncompliance with regulatory requirements under the [place] (HIPAA) in connection with the good faith provision of telehealth during the XX123456 national public health emergency. ([place])

## 2019-06-29 NOTE — Telephone Encounter (Signed)
this pt called and lmonvm requesting to cancel all of her appts since she s/w her doctor and he wants her to do ot instead.

## 2019-06-30 ENCOUNTER — Encounter (HOSPITAL_COMMUNITY): Payer: Medicare Other | Admitting: Physical Therapy

## 2019-07-05 ENCOUNTER — Encounter (HOSPITAL_COMMUNITY): Payer: Medicare Other | Admitting: Physical Therapy

## 2019-07-07 ENCOUNTER — Telehealth: Payer: Self-pay

## 2019-07-07 ENCOUNTER — Encounter (HOSPITAL_COMMUNITY): Payer: Medicare Other | Admitting: Physical Therapy

## 2019-07-07 DIAGNOSIS — G8929 Other chronic pain: Secondary | ICD-10-CM

## 2019-07-07 DIAGNOSIS — M25512 Pain in left shoulder: Secondary | ICD-10-CM

## 2019-07-07 NOTE — Telephone Encounter (Signed)
Dr. Holley Raring sent her OT and they need for someone to put into the order a diagnosis for shoulder pain, because that is what they will be working on with her. You can go into the referral and add the dx to the existing order. She has an appt tomorrow so it needs to be done today please.

## 2019-07-07 NOTE — Telephone Encounter (Signed)
Dr Holley Raring,  Can you put in a diagnosis please.

## 2019-07-08 ENCOUNTER — Encounter (HOSPITAL_COMMUNITY): Payer: Self-pay

## 2019-07-08 ENCOUNTER — Ambulatory Visit (HOSPITAL_COMMUNITY): Payer: Medicare Other

## 2019-07-08 ENCOUNTER — Other Ambulatory Visit: Payer: Self-pay

## 2019-07-12 ENCOUNTER — Encounter (HOSPITAL_COMMUNITY): Payer: Medicare Other | Admitting: Physical Therapy

## 2019-07-13 ENCOUNTER — Ambulatory Visit: Payer: Medicare Other | Admitting: Student in an Organized Health Care Education/Training Program

## 2019-07-14 ENCOUNTER — Encounter (HOSPITAL_COMMUNITY): Payer: Medicare Other | Admitting: Physical Therapy

## 2019-07-18 ENCOUNTER — Other Ambulatory Visit: Payer: Self-pay | Admitting: Student in an Organized Health Care Education/Training Program

## 2019-07-18 ENCOUNTER — Other Ambulatory Visit: Payer: Self-pay

## 2019-07-18 ENCOUNTER — Encounter (HOSPITAL_COMMUNITY): Payer: Self-pay

## 2019-07-18 ENCOUNTER — Encounter (HOSPITAL_COMMUNITY): Payer: Medicare Other | Admitting: Physical Therapy

## 2019-07-18 ENCOUNTER — Telehealth: Payer: Self-pay | Admitting: Student in an Organized Health Care Education/Training Program

## 2019-07-18 ENCOUNTER — Ambulatory Visit (HOSPITAL_COMMUNITY): Payer: Medicare Other | Attending: Student in an Organized Health Care Education/Training Program

## 2019-07-18 DIAGNOSIS — R29898 Other symptoms and signs involving the musculoskeletal system: Secondary | ICD-10-CM | POA: Diagnosis not present

## 2019-07-18 DIAGNOSIS — M25512 Pain in left shoulder: Secondary | ICD-10-CM | POA: Insufficient documentation

## 2019-07-18 DIAGNOSIS — G8929 Other chronic pain: Secondary | ICD-10-CM | POA: Diagnosis present

## 2019-07-18 DIAGNOSIS — M25612 Stiffness of left shoulder, not elsewhere classified: Secondary | ICD-10-CM | POA: Diagnosis present

## 2019-07-18 NOTE — Therapy (Signed)
[facility] [place], [place], [address] Phone: [phone]   Fax:  [phone]  Occupational Therapy Evaluation  Patient Details  Name: [name] MRN: [id number] Date of Birth: 03/05/1966 Referring Provider (OT): Gillis Santa MD   Encounter Date: 07/18/2019  OT End of Session - 07/18/19 1246    Visit Number  1    Number of Visits  12    Date for OT Re-Evaluation  08/29/19    Authorization Type  1) [employer] Medicare 2) medicaid    Authorization Time Period  no visit limit. . No copay. Complete progress note at visit 10    Authorization - Visit Number  1    Authorization - Number of Visits  10    OT Start Time  1115    OT Stop Time  1208    OT Time Calculation (min)  53 min    Activity Tolerance  Patient tolerated treatment well    Behavior During Therapy  WFL for tasks assessed/performed       Past Medical History:  Diagnosis Date  . Anxiety   . Arthritis    "pretty much all over; mainly neck and back" (12/26/2015)  . Asthma   . Bipolar 1 disorder ([place])   . Bipolar disorder ([place])   . Cervical cancer ([place])    "stage I"  . Chronic back pain   . Chronic bronchitis ([place])   . COPD (chronic obstructive pulmonary disease) ([place])   . Degenerative disc disease   . Depression   . Dysrhythmia   . Fibromyalgia   . GERD (gastroesophageal reflux disease)   . Hepatic cyst   . High cholesterol   . History of hiatal hernia   . Hypertension 08/04/2012  . IBS (irritable bowel syndrome) Diagnosed November 2012  . Incisional hernia with gangrene and obstruction 12/26/2015  . Melanoma of back ([place])   . Ovarian cancer ([place])    "stage II"  . Plantar fasciitis, bilateral   . Sleep apnea    cpap coming  "soon" (12/26/2015)    Past Surgical History:  Procedure Laterality Date  . ABDOMINAL HYSTERECTOMY  1997  . APPENDECTOMY    . CARDIAC CATHETERIZATION  2015  . CARPAL TUNNEL RELEASE Right   . CESAREAN SECTION  1987  .  CHOLECYSTECTOMY OPEN  1998  . COLONOSCOPY  November 2012  . EYE SURGERY     CATARACTS REMOVED 09/09/17 and 09/16/17  . HERNIA REPAIR    . INCISIONAL HERNIA REPAIR N/A 12/26/2015   Procedure: LAPAROSCOPIC REPAIR INCISIONAL HERNIA WITH MESH;  Surgeon: Fanny Skates, MD;  Location: [facility];  Service: General;  Laterality: N/A;  . INSERTION OF MESH N/A 12/26/2015   Procedure: INSERTION OF MESH;  Surgeon: Fanny Skates, MD;  Location: [facility];  Service: General;  Laterality: N/A;  . [place]  . LAPAROSCOPIC INCISIONAL / UMBILICAL / VENTRAL HERNIA REPAIR  12/26/2015   repair of incarcerated incisional hernia with mesh  . LIVER CYST REMOVAL  2014  . MELANOMA EXCISION  January 2013   removed from back  . SHOULDER SURGERY Left 2010   Humeral Head Microfracture   . TUBAL LIGATION    . UPPER GASTROINTESTINAL ENDOSCOPY  November 2012    There were no vitals filed for this visit.  Subjective Assessment - 07/18/19 1122    Subjective   S: 3 days ago it was hurting so bad I couldn't move my arm.  Pertinent History  Patient is a 53 y/o female S/P left shoulder pain which has been ongoing for the past 6 months. Pt reports a history of Left RTC impingement and micro tears at the humeral head while undergoing surgery to repair it. She recently has been experiencing shoulder pain with no known cause for onset. Dr. Holley Raring has referred patient to occupational therapy for evaluation and treatment.    Patient Stated Goals  To have less pain and be able to complete activities with increased comfort.    Currently in Pain?  Yes    Pain Score  4     Pain Location  Shoulder    Pain Orientation  Left    Pain Descriptors / Indicators  Throbbing;Constant   piercing when tryin to raise it.   Pain Type  Chronic pain    Pain Radiating Towards  up neck at times.    Pain Onset  More than a month ago   about 6 months ago   Pain Frequency  Constant    Aggravating Factors   Crocheting,  sitting on the couch, increased use.    Pain Relieving Factors  pain relief rub, hot bath/water    Effect of Pain on Daily Activities  patient is able to push through the pain when it's at 4/10. A 9/10 would stop her from being able to use it.    Multiple Pain Sites  No        [facility] OT Assessment - 07/18/19 1125      Assessment   Medical Diagnosis  left shoulder pain    Referring Provider (OT)  Gillis Santa MD    Onset Date/Surgical Date  --   about 6 months ago   Hand Dominance  Right    Next MD Visit  07/20/2019      Precautions   Precautions  Fall      Restrictions   Weight Bearing Restrictions  No      Balance Screen   Has the patient fallen in the past 6 months  Yes    How many times?  3    Has the patient had a decrease in activity level because of a fear of falling?   No    Is the patient reluctant to leave their home because of a fear of falling?   No      Home  Environment   Family/patient expects to be discharged to:  Private residence    Living Arrangements  Spouse/significant other      Prior Function   Level of [place] for independence;Independent with basic ADLs      ADL   ADL comments  Difficulty completing household chores, lifting anything heavy (bucket of cleaning supplies), will drop things due to pain in the shoulder radiating down her arm. Difficulty getting her shirt on and off.      Mobility   Mobility Status  History of falls      Written Expression   Dominant Hand  Left      Vision - History   Baseline Vision  Wears glasses only for reading      Cognition   Overall Cognitive Status  Within Functional Limits for tasks assessed      Observation/Other Assessments   Focus on Therapeutic Outcomes (FOTO)   40/100      ROM / Strength   AROM / PROM / Strength  AROM;Strength;PROM      Palpation   Palpation comment  Max fascial restrictions in left upper arm, trapezius, and scapularis region.       AROM    Overall AROM Comments  Assessed seated. IR/er adducted    AROM Assessment Site  Shoulder    Right/Left Shoulder  Left    Left Shoulder Flexion  92 Degrees   Right: 150   Left Shoulder ABduction  90 Degrees   right: 160   Left Shoulder Internal Rotation  90 Degrees   right: 90   Left Shoulder External Rotation  40 Degrees   right: 60     PROM   Overall PROM Comments  Assessed supne. IR/er adducted    PROM Assessment Site  Shoulder    Right/Left Shoulder  Left    Left Shoulder Flexion  95 Degrees    Left Shoulder ABduction  74 Degrees    Left Shoulder Internal Rotation  90 Degrees    Left Shoulder External Rotation  40 Degrees      Strength   Overall Strength Comments  Assessed seated . IR/er adducted    Strength Assessment Site  Shoulder    Right/Left Shoulder  Left    Left Shoulder Flexion  3-/5    Left Shoulder ABduction  3-/5    Left Shoulder Internal Rotation  3+/5    Left Shoulder External Rotation  4+/5               OT Treatments/Exercises (OP) - 07/18/19 0001      Modalities   Modalities  Electrical Stimulation;Moist Heat      Moist Heat Therapy   Number Minutes Moist Heat  10 Minutes    Moist Heat Location  Shoulder      Electrical Stimulation   Electrical Stimulation Location  left shoulder    Electrical Stimulation Action  interferential    Electrical Stimulation Parameters  12.0 CV    Electrical Stimulation Goals  Pain            OT Education - 07/18/19 1246    Education Details  Table slides. Handout for purchasing TENS unit.    Person(s) Educated  Patient    Methods  Explanation;Demonstration;Handout    Comprehension  Verbalized understanding       OT Short Term Goals - 07/18/19 1254      OT SHORT TERM GOAL #1   Title  Patient will be educated and independent with HEP in order to increase her functional use of her LUE and faciliate her progress in therapy.    Time  3    Period  Weeks    Status  New    Target Date  08/08/19       OT SHORT TERM GOAL #2   Title  Patient will increase LUE P/ROM to [facility] in order to be able to reach overhead with less difficulty.    Time  3    Period  Weeks    Status  New      OT SHORT TERM GOAL #3   Title  Patient will decrease fascial restrictions to moderate amount of less in order to increse functional mobility needed to complete reaching tasks.    Time  3    Period  Weeks    Status  New        OT Long Term Goals - 07/18/19 1306      OT LONG TERM GOAL #1   Title  Patient will return to highest level of independence with all basic ADL tasks while using her  LUE as her non dominant extremity for 50% or more of daily tasks.    Time  6    Period  Weeks    Status  New    Target Date  08/29/19      OT LONG TERM GOAL #2   Title  Patient will increase A/ROM of her LUE to [facility] in order to increase ability to her shirts on and off with less difficulty.    Time  6    Period  Weeks    Status  New      OT LONG TERM GOAL #3   Title  Patient will decrease pain level during functional use of her LUE to 3/10 or less on average.    Time  6    Period  Weeks    Status  New      OT LONG TERM GOAL #4   Title  Patient will increase LUE strength to 4/5 overall in order to complete daily household chores at a moderate difficulty level.    Time  6    Period  Weeks    Status  New      OT LONG TERM GOAL #5   Title  Patient will decrease fascial restrictions in the LUE to min amount or less in order to increase functional mobility needed to complete functional reaching tasks.    Time  6    Period  Weeks    Status  New            Plan - 07/18/19 1250    Clinical Impression Statement  A: patient is a 61 y/ female S/P left shouldre pain causing increased fascial restrctions, pain, and decreased ROM and strength resulting in difficulty completing daily tasks using her LUE.    OT Occupational Profile and History  Problem Focused Assessment - Including review of records relating to  presenting problem    Occupational performance deficits (Please refer to evaluation for details):  ADL's;IADL's;Leisure;Rest and Sleep    Body Structure / Function / Physical Skills  ADL;ROM;Fascial restriction;Strength;Pain;UE functional use;Decreased knowledge of use of DME    Rehab Potential  Excellent    Clinical Decision Making  Several treatment options, min-mod task modification necessary    Comorbidities Affecting Occupational Performance:  Presence of comorbidities impacting occupational performance    Comorbidities impacting occupational performance description:  History of left RTC surgery, back pain    Modification or Assistance to Complete Evaluation   No modification of tasks or assist necessary to complete eval    OT Frequency  2x / week    OT Duration  6 weeks    OT Treatment/Interventions  Self-care/ADL training;Therapeutic exercise;Manual Therapy;Neuromuscular education;Ultrasound;Therapeutic activities;DME and/or AE instruction;Cryotherapy;Electrical Stimulation;Moist Heat;Passive range of motion;Patient/family education    Plan  P: Patient will benefit from skilled OT services to increase functional performance and allow her to use her LUE as her nondominant extremity with less pain and difficulty. Treatment plan: myofascial release, manual stretching, P/ROM, AA/ROM, A/ROM, general shoulder strengthening. Modalities PRN.    Consulted and Agree with Plan of Care  Patient       Patient will benefit from skilled therapeutic intervention in order to improve the following deficits and impairments:   Body Structure / Function / Physical Skills: ADL, ROM, Fascial restriction, Strength, Pain, UE functional use, Decreased knowledge of use of DME       Visit Diagnosis: Other symptoms and signs involving the musculoskeletal system - Plan: Ot plan of care cert/re-cert  Chronic left shoulder pain - Plan: Ot plan of care cert/re-cert  Stiffness of left shoulder, not elsewhere  classified - Plan: Ot plan of care cert/re-cert    Problem List Patient Active Problem List   Diagnosis Date Noted  . Chronic pain of both shoulders 06/02/2019  . Primary osteoarthritis of left shoulder 06/02/2019  . H/O repair of left rotator cuff 06/02/2019  . Acute pansinusitis 05/03/2019  . Vitamin D deficiency 04/20/2019  . Colon cancer screening 02/15/2018  . Bipolar 1 disorder, mixed, moderate ([place]) 12/25/2017  . Prediabetes 10/01/2017  . Coronary artery disease 10/01/2017  . Hepatic cyst 08/18/2017  . Abdominal pain 08/18/2017  . Fibromyalgia 04/16/2017  . Sacroiliac joint pain 04/16/2017  . Incisional hernia with gangrene and obstruction 12/26/2015  . Incisional hernia with obstruction 12/26/2015  . OSA (obstructive sleep apnea) 12/13/2015  . Irritable bowel syndrome 09/25/2015  . Risk for falls 09/25/2015  . Insomnia 08/14/2015  . Hypertensive urgency 03/19/2015  . Unsteady gait 03/19/2015  . PTSD (post-traumatic stress disorder) 01/31/2014  . Chronic right-sided low back pain with right-sided sciatica 08/18/2013  . DDD (degenerative disc disease), cervical 08/18/2013  . DDD (degenerative disc disease), lumbosacral 08/18/2013  . Myalgia and myositis 08/18/2013  . Osteoarthritis 09/14/2012  . Asthma 09/14/2012  . Chronic obstructive pulmonary disease ([place]) 09/14/2012  . Gastro-esophageal reflux disease without esophagitis 09/14/2012  . Essential hypertension 09/14/2012  . Tobacco use 09/14/2012  . Insomnia due to mental disorder 05/28/2012   Ailene Ravel, OTR/L,CBIS  [phone]  07/18/2019, 1:14 PM  [facility] [address] [place], [place], [address] Phone: [phone]   Fax:  [phone]  Name: [name] MRN: [id number] Date of Birth: 04-02-1966

## 2019-07-18 NOTE — Patient Instructions (Signed)
  Complete the following 2-3 times a day, 10-15 times each.   SHOULDER: Flexion On Table   Place hands on towel placed on table, elbows straight. Lean forward with you upper body, pushing towel away from body.  ___ reps per set, ___ sets per day  Abduction (Passive)   With arm out to side, resting on towel placed on table with palm DOWN, keeping trunk away from table, lean to the side while pushing towel away from body.  Repeat ____ times. Do ____ sessions per day.  Copyright  VHI. All rights reserved.     Internal Rotation (Assistive)   Seated with elbow bent at right angle and held against side, slide arm on table surface in an inward arc keeping elbow anchored in place. Repeat ____ times. Do ____ sessions per day. Activity: Use this motion to brush crumbs off the table.  Copyright  VHI. All rights reserved.

## 2019-07-18 NOTE — Telephone Encounter (Signed)
Patient is being seen today for Outpatient PT for her bilateral shoulder pain. The order sent on 06-30-19 needs to have shoulder pain diagnosis added please.  Please call Crystal at [phone]

## 2019-07-20 ENCOUNTER — Encounter (HOSPITAL_COMMUNITY): Payer: Medicare Other

## 2019-07-20 ENCOUNTER — Ambulatory Visit
Admission: RE | Admit: 2019-07-20 | Discharge: 2019-07-20 | Disposition: A | Discharge status: Home / Self Care | Payer: Medicare Other | Source: Ambulatory Visit | Attending: Student in an Organized Health Care Education/Training Program | Admitting: Student in an Organized Health Care Education/Training Program

## 2019-07-20 ENCOUNTER — Encounter: Payer: Self-pay | Admitting: Student in an Organized Health Care Education/Training Program

## 2019-07-20 ENCOUNTER — Other Ambulatory Visit: Payer: Self-pay

## 2019-07-20 ENCOUNTER — Ambulatory Visit (HOSPITAL_BASED_OUTPATIENT_CLINIC_OR_DEPARTMENT_OTHER): Payer: Medicare Other | Admitting: Student in an Organized Health Care Education/Training Program

## 2019-07-20 VITALS — BP 209/100 | HR 79 | Temp 98.6°F | Resp 16 | Ht 62.0 in | Wt 164.0 lb

## 2019-07-20 DIAGNOSIS — M47816 Spondylosis without myelopathy or radiculopathy, lumbar region: Secondary | ICD-10-CM | POA: Diagnosis not present

## 2019-07-20 MED ORDER — LIDOCAINE HCL 2 % IJ SOLN
20.0000 mL | Freq: Once | INTRAMUSCULAR | Status: AC
  Administered 2019-07-20: 400 mg

## 2019-07-20 MED ORDER — DEXAMETHASONE SODIUM PHOSPHATE 10 MG/ML IJ SOLN
INTRAMUSCULAR | Status: AC
  Filled 2019-07-20: qty 2

## 2019-07-20 MED ORDER — FENTANYL CITRATE (PF) 100 MCG/2ML IJ SOLN
25.0000 ug | INTRAMUSCULAR | Status: DC | PRN
  Administered 2019-07-20: 50 ug via INTRAVENOUS

## 2019-07-20 MED ORDER — LIDOCAINE HCL 2 % IJ SOLN
INTRAMUSCULAR | Status: AC
  Filled 2019-07-20: qty 20

## 2019-07-20 MED ORDER — DEXAMETHASONE SODIUM PHOSPHATE 10 MG/ML IJ SOLN
10.0000 mg | Freq: Once | INTRAMUSCULAR | Status: AC
  Administered 2019-07-20: 10 mg

## 2019-07-20 MED ORDER — ROPIVACAINE HCL 2 MG/ML IJ SOLN
INTRAMUSCULAR | Status: AC
  Filled 2019-07-20: qty 20

## 2019-07-20 MED ORDER — FENTANYL CITRATE (PF) 100 MCG/2ML IJ SOLN
INTRAMUSCULAR | Status: AC
  Filled 2019-07-20: qty 2

## 2019-07-20 MED ORDER — ROPIVACAINE HCL 2 MG/ML IJ SOLN
9.0000 mL | Freq: Once | INTRAMUSCULAR | Status: AC
  Administered 2019-07-20: 9 mL via PERINEURAL

## 2019-07-20 NOTE — Patient Instructions (Signed)

## 2019-07-20 NOTE — Progress Notes (Signed)
Safety precautions to be maintained throughout the outpatient stay will include: orient to surroundings, keep bed in low position, maintain call bell within reach at all times, provide assistance with transfer out of bed and ambulation.  

## 2019-07-20 NOTE — Progress Notes (Signed)
Patient's Name: [name]  MRN: [id number]  Referring Provider: Maryruth Hancock, MD  DOB: 11-Feb-1966  PCP: Tammy Hancock, MD  DOS: 07/20/2019  Note by: Gillis Santa, MD  Service setting: Ambulatory outpatient  Specialty: Interventional Pain Management  Patient type: Established  Location: [facility] (AMB) Pain Management Facility  Visit type: Interventional Procedure   Primary Reason for Visit: Interventional Pain Management Treatment. CC: Back Pain  Procedure:          Anesthesia, Analgesia, Anxiolysis:  Type: Lumbar Facet, Medial Branch Block(s) #1  Primary Purpose: Diagnostic Region: Posterolateral Lumbosacral Spine Level:L3, L4, L5, & S1 Medial Branch Level(s). Injecting these levels blocks the L3-4, L4-5, and L5-S1 lumbar facet joints. Laterality: Bilateral  Type: Moderate (Conscious) Sedation combined with Local Anesthesia Indication(s): Analgesia and Anxiety Route: Intravenous (IV) IV Access: Secured Sedation: Meaningful verbal contact was maintained at all times during the procedure  Local Anesthetic: Lidocaine 1-2%  Position: Prone   Indications: 1. Lumbar facet arthropathy (L4/5 and L5/S1)    Pain Score: Pre-procedure: 7 /10 Post-procedure: 0-No pain/10   Pre-op Assessment:  [name] is a 53 y.o. (year old), female patient, seen today for interventional treatment. She  has a past surgical history that includes Shoulder surgery (Left, 2010); Abdominal hysterectomy (1997); Laparoscopic abdominal exploration (1997); Cesarean section (1987); Appendectomy; Upper gastrointestinal endoscopy (November 2012); Colonoscopy (November 2012); Melanoma excision (January 2013); Tubal ligation; Carpal tunnel release (Right); Liver cyst removal (2014); Hernia repair; Laparoscopic incisional / umbilical / ventral hernia repair (12/26/2015); Cholecystectomy open (1998); Cardiac catheterization (2015); Incisional hernia repair (N/A, 12/26/2015); Insertion of mesh (N/A, 12/26/2015); and Eye surgery.  [name] has a current medication list which includes the following prescription(s): aripiprazole, epinephrine, hydroxyzine, losartan, tizanidine, and chlorthalidone, and the following Facility-Administered Medications: fentanyl. Her primarily concern today is the Back Pain  Initial Vital Signs:  Pulse/HCG Rate: 65ECG Heart Rate: 73 Temp: (!) 97.3 F (36.3 C) Resp: 18 BP: (!) 175/90 SpO2: 99 %  BMI: Estimated body mass index is 30 kg/m as calculated from the following:   Height as of this encounter: 5\' 2"  (1.575 m).   Weight as of this encounter: 164 lb (74.4 kg).  Risk Assessment: Allergies: Reviewed. She is allergic to barium-containing compounds; bee venom; flu virus vaccine; seldane [terfenadine]; sulfa antibiotics; lisinopril; lithium; tetracyclines & related; celebrex [celecoxib]; trazodone and nefazodone; zinc; aspirin; ativan [lorazepam]; erythromycin; and lipitor [atorvastatin].  Allergy Precautions: None required Coagulopathies: Reviewed. None identified.  Blood-thinner therapy: None at this time Active Infection(s): Reviewed. None identified. [name] is afebrile  Site Confirmation: [name] was asked to confirm the procedure and laterality before marking the site Procedure checklist: Completed Consent: Before the procedure and under the influence of no sedative(s), amnesic(s), or anxiolytics, the patient was informed of the treatment options, risks and possible complications. To fulfill our ethical and legal obligations, as recommended by the American Medical Association's Code of Ethics, I have informed the patient of my clinical impression; the nature and purpose of the treatment or procedure; the risks, benefits, and possible complications of the intervention; the alternatives, including doing nothing; the risk(s) and benefit(s) of the alternative treatment(s) or procedure(s); and the risk(s) and benefit(s) of doing nothing. The patient was provided information about the  general risks and possible complications associated with the procedure. These may include, but are not limited to: failure to achieve desired goals, infection, bleeding, organ or nerve damage, allergic reactions, paralysis, and death. In addition, the patient was informed of those  risks and complications associated to Spine-related procedures, such as failure to decrease pain; infection (i.e.: Meningitis, epidural or intraspinal abscess); bleeding (i.e.: epidural hematoma, subarachnoid hemorrhage, or any other type of intraspinal or peri-dural bleeding); organ or nerve damage (i.e.: Any type of peripheral nerve, nerve root, or spinal cord injury) with subsequent damage to sensory, motor, and/or autonomic systems, resulting in permanent pain, numbness, and/or weakness of one or several areas of the body; allergic reactions; (i.e.: anaphylactic reaction); and/or death. Furthermore, the patient was informed of those risks and complications associated with the medications. These include, but are not limited to: allergic reactions (i.e.: anaphylactic or anaphylactoid reaction(s)); adrenal axis suppression; blood sugar elevation that in diabetics may result in ketoacidosis or comma; water retention that in patients with history of congestive heart failure may result in shortness of breath, pulmonary edema, and decompensation with resultant heart failure; weight gain; swelling or edema; medication-induced neural toxicity; particulate matter embolism and blood vessel occlusion with resultant organ, and/or nervous system infarction; and/or aseptic necrosis of one or more joints. Finally, the patient was informed that Medicine is not an exact science; therefore, there is also the possibility of unforeseen or unpredictable risks and/or possible complications that may result in a catastrophic outcome. The patient indicated having understood very clearly. We have given the patient no guarantees and we have made no promises.  Enough time was given to the patient to ask questions, all of which were answered to the patient's satisfaction. [name] has indicated that she wanted to continue with the procedure. Attestation: I, the ordering provider, attest that I have discussed with the patient the benefits, risks, side-effects, alternatives, likelihood of achieving goals, and potential problems during recovery for the procedure that I have provided informed consent. Date  Time: 07/20/2019  9:06 AM  Pre-Procedure Preparation:  Monitoring: As per clinic protocol. Respiration, ETCO2, SpO2, BP, heart rate and rhythm monitor placed and checked for adequate function Safety Precautions: Patient was assessed for positional comfort and pressure points before starting the procedure. Time-out: I initiated and conducted the "Time-out" before starting the procedure, as per protocol. The patient was asked to participate by confirming the accuracy of the "Time Out" information. Verification of the correct person, site, and procedure were performed and confirmed by me, the nursing staff, and the patient. "Time-out" conducted as per Joint Commission's Universal Protocol (UP.01.01.01). Time: 1051  Description of Procedure:          Laterality: Bilateral. The procedure was performed in identical fashion on both sides. Levels:  L3, L4, L5, & S1 Medial Branch Level(s) Area Prepped: Posterior Lumbosacral Region Prepping solution: DuraPrep (Iodine Povacrylex [0.7% available iodine] and Isopropyl Alcohol, 74% w/w) Safety Precautions: Aspiration looking for blood return was conducted prior to all injections. At no point did we inject any substances, as a needle was being advanced. Before injecting, the patient was told to immediately notify me if she was experiencing any new onset of "ringing in the ears, or metallic taste in the mouth". No attempts were made at seeking any paresthesias. Safe injection practices and needle disposal techniques used.  Medications properly checked for expiration dates. SDV (single dose vial) medications used. After the completion of the procedure, all disposable equipment used was discarded in the proper designated medical waste containers. Local Anesthesia: Protocol guidelines were followed. The patient was positioned over the fluoroscopy table. The area was prepped in the usual manner. The time-out was completed. The target area was identified using fluoroscopy. A 12-in long, straight,  sterile hemostat was used with fluoroscopic guidance to locate the targets for each level blocked. Once located, the skin was marked with an approved surgical skin marker. Once all sites were marked, the skin (epidermis, dermis, and hypodermis), as well as deeper tissues (fat, connective tissue and muscle) were infiltrated with a small amount of a short-acting local anesthetic, loaded on a 10cc syringe with a 25G, 1.5-in  Needle. An appropriate amount of time was allowed for local anesthetics to take effect before proceeding to the next step. Local Anesthetic: Lidocaine 2.0% The unused portion of the local anesthetic was discarded in the proper designated containers. Technical explanation of process:   L3 Medial Branch Nerve Block (MBB): The target area for the L3 medial branch is at the junction of the postero-lateral aspect of the superior articular process and the superior, posterior, and medial edge of the transverse process of L4. Under fluoroscopic guidance, a Quincke needle was inserted until contact was made with os over the superior postero-lateral aspect of the pedicular shadow (target area). After negative aspiration for blood, 1 mL of the nerve block solution was injected without difficulty or complication. The needle was removed intact. L4 Medial Branch Nerve Block (MBB): The target area for the L4 medial branch is at the junction of the postero-lateral aspect of the superior articular process and the superior, posterior, and  medial edge of the transverse process of L5. Under fluoroscopic guidance, a Quincke needle was inserted until contact was made with os over the superior postero-lateral aspect of the pedicular shadow (target area). After negative aspiration for blood, 1 mL of the nerve block solution was injected without difficulty or complication. The needle was removed intact. L5 Medial Branch Nerve Block (MBB): The target area for the L5 medial branch is at the junction of the postero-lateral aspect of the superior articular process and the superior, posterior, and medial edge of the sacral ala. Under fluoroscopic guidance, a Quincke needle was inserted until contact was made with os over the superior postero-lateral aspect of the pedicular shadow (target area). After negative aspiration for blood, 39mL of the nerve block solution was injected without difficulty or complication. The needle was removed intact. S1 Medial Branch Nerve Block (MBB): The target area for the S1 medial branch is at the posterior and inferior 6 o'clock position of the L5-S1 facet joint. Under fluoroscopic guidance, the Quincke needle inserted for the L5 MBB was redirected until contact was made with os over the inferior and postero aspect of the sacrum, at the 6 o' clock position under the L5-S1 facet joint (Target area). After negative aspiration for blood, 37mL of the nerve block solution was injected without difficulty or complication. The needle was removed intact.  Nerve block solution: 10 cc solution made of 8 cc of 0.2% ropivacaine, 2 cc of Decadron 10 mg/cc.  1 to 1.5 cc injected at each level above bilaterally.  The unused portion of the solution was discarded in the proper designated containers. Procedural Needles: 22-gauge, 3.5-inch, Quincke needles used for all levels.  Once the entire procedure was completed, the treated area was cleaned, making sure to leave some of the prepping solution back to take advantage of its long term  bactericidal properties.    Illustration of the posterior view of the lumbar spine and the posterior neural structures. Laminae of L2 through S1 are labeled. DPRL5, dorsal primary ramus of L5; DPRS1, dorsal primary ramus of S1; DPR3, dorsal primary ramus of L3; FJ, facet (zygapophyseal) joint L3-L4;  I, inferior articular process of L4; LB1, lateral branch of dorsal primary ramus of L1; IAB, inferior articular branches from L3 medial branch (supplies L4-L5 facet joint); IBP, intermediate branch plexus; MB3, medial branch of dorsal primary ramus of L3; NR3, third lumbar nerve root; S, superior articular process of L5; SAB, superior articular branches from L4 (supplies L4-5 facet joint also); TP3, transverse process of L3.  Vitals:   07/20/19 1106 07/20/19 1113 07/20/19 1123 07/20/19 1133  BP: (!) 198/107 (!) 208/107 (!) 208/94 (!) 209/100  Pulse: 79     Resp: 14 14 16 16   Temp:  98.6 F (37 C)    TempSrc:      SpO2: 98% 98% 98% 97%  Weight:      Height:         Start Time: 1051 hrs. End Time: 1106 hrs.  Imaging Guidance (Spinal):          Type of Imaging Technique: Fluoroscopy Guidance (Spinal) Indication(s): Assistance in needle guidance and placement for procedures requiring needle placement in or near specific anatomical locations not easily accessible without such assistance. Exposure Time: Please see nurses notes. Contrast: None used. Fluoroscopic Guidance: I was personally present during the use of fluoroscopy. "Tunnel Vision Technique" used to obtain the best possible view of the target area. Parallax error corrected before commencing the procedure. "Direction-depth-direction" technique used to introduce the needle under continuous pulsed fluoroscopy. Once target was reached, antero-posterior, oblique, and lateral fluoroscopic projection used confirm needle placement in all planes. Images permanently stored in EMR. Interpretation: No contrast injected. I personally interpreted the  imaging intraoperatively. Adequate needle placement confirmed in multiple planes. Permanent images saved into the patient's record.  Antibiotic Prophylaxis:   Anti-infectives (From admission, onward)   None     Indication(s): None identified  Post-operative Assessment:  Post-procedure Vital Signs:  Pulse/HCG Rate: 7975 Temp: 98.6 F (37 C) Resp: 16 BP: (!) 209/100(Dr Moise Friday aware) SpO2: 97 %  EBL: None  Complications: No immediate post-treatment complications observed by team, or reported by patient.  Note: The patient tolerated the entire procedure well. A repeat set of vitals were taken after the procedure and the patient was kept under observation following institutional policy, for this type of procedure. Post-procedural neurological assessment was performed, showing return to baseline, prior to discharge. The patient was provided with post-procedure discharge instructions, including a section on how to identify potential problems. Should any problems arise concerning this procedure, the patient was given instructions to immediately contact us, at any time, without hesitation. In any case, we plan to contact the patient by telephone for a follow-up status report regarding this interventional procedure.  Comments:  No additional relevant information.  Plan of Care  Orders:  Orders Placed This Encounter  Procedures  . Fluoro (C-Arm) (<60 min) (No Report)    Intraoperative interpretation by procedural physician at [place].    Standing Status:   Standing    Number of Occurrences:   1    Order Specific Question:   Reason for exam:    Answer:   Assistance in needle guidance and placement for procedures requiring needle placement in or near specific anatomical locations not easily accessible without such assistance.   Medications ordered for procedure: Meds ordered this encounter  Medications  . lidocaine (XYLOCAINE) 2 % (with pres) injection 400 mg  . fentaNYL  (SUBLIMAZE) injection 25-50 mcg    Make sure Narcan is available in the pyxis when using this medication. In the event  of respiratory depression (RR< 8/min): Titrate NARCAN (naloxone) in increments of 0.1 to 0.2 mg IV at 2-3 minute intervals, until desired degree of reversal.  . ropivacaine (PF) 2 mg/mL (0.2%) (NAROPIN) injection 9 mL  . ropivacaine (PF) 2 mg/mL (0.2%) (NAROPIN) injection 9 mL  . dexamethasone (DECADRON) injection 10 mg  . dexamethasone (DECADRON) injection 10 mg   Medications administered: We administered lidocaine, fentaNYL, ropivacaine (PF) 2 mg/mL (0.2%), ropivacaine (PF) 2 mg/mL (0.2%), dexamethasone, and dexamethasone.  See the medical record for exact dosing, route, and time of administration.  Follow-up plan:   Return in about 4 weeks (around 08/17/2019) for Post Procedure Evaluation, virtual.      Status post diagnostic lumbar facet medial branch nerve blocks at L3, L4, L5, S1 on 07/20/2019   Recent Visits Date Type Provider Dept  06/29/19 Office Visit Gillis Santa, MD [facility]  06/02/19 Office Visit Gillis Santa, MD [facility]  Showing recent visits within past 90 days and meeting all other requirements   Today's Visits Date Type Provider Dept  07/20/19 Procedure visit Gillis Santa, MD [facility]  Showing today's visits and meeting all other requirements   Future Appointments Date Type Provider Dept  08/29/19 Appointment Gillis Santa, MD [facility]  Showing future appointments within next 90 days and meeting all other requirements   Disposition: Discharge home  Discharge Date & Time: 07/20/2019; 1136(instructed to take BP meds as soon as gets home.) hrs.   Primary Care Physician: Tammy Hancock, MD Location: [facility] Note by: Gillis Santa, MD Date: 07/20/2019; Time: 3:23 PM  Disclaimer:  Medicine is not an exact science. The only guarantee in medicine is that nothing is  guaranteed. It is important to note that the decision to proceed with this intervention was based on the information collected from the patient. The Data and conclusions were drawn from the patient's questionnaire, the interview, and the physical examination. Because the information was provided in large part by the patient, it cannot be guaranteed that it has not been purposely or unconsciously manipulated. Every effort has been made to obtain as much relevant data as possible for this evaluation. It is important to note that the conclusions that lead to this procedure are derived in large part from the available data. Always take into account that the treatment will also be dependent on availability of resources and existing treatment guidelines, considered by other Pain Management Practitioners as being common knowledge and practice, at the time of the intervention. For Medico-Legal purposes, it is also important to point out that variation in procedural techniques and pharmacological choices are the acceptable norm. The indications, contraindications, technique, and results of the above procedure should only be interpreted and judged by a Board-Certified Interventional Pain Specialist with extensive familiarity and expertise in the same exact procedure and technique.

## 2019-07-25 ENCOUNTER — Telehealth: Payer: Self-pay | Admitting: Family Medicine

## 2019-07-25 NOTE — Telephone Encounter (Signed)
Routing to Dr. Corum for advice ? 

## 2019-07-25 NOTE — Telephone Encounter (Signed)
No-it would not prevent . Likely she will need to wait longer after administration. COVID vaccine is made different than influenza

## 2019-07-25 NOTE — Telephone Encounter (Signed)
Patient is calling and states she is allergic to the flu shot and would like to know if that would prevent her from getting the covid vaccine

## 2019-07-26 ENCOUNTER — Encounter (HOSPITAL_COMMUNITY): Payer: Medicare Other | Admitting: Physical Therapy

## 2019-07-26 ENCOUNTER — Telehealth: Payer: Self-pay | Admitting: Cardiology

## 2019-07-26 NOTE — Telephone Encounter (Signed)
chlorthalidone (HYGROTON) 25 MG tablet  Stopped taking the medication due to low heart rate. Stated it would drop after she took this medication  Episodes of heart flutter are getting worse especially at night.  Lasting 5 minutes or longer verses 1-2 minutes

## 2019-07-26 NOTE — Telephone Encounter (Signed)
Will forward to Dr. Branch as an FYI: 

## 2019-07-26 NOTE — Telephone Encounter (Signed)
Patient is aware 

## 2019-07-27 ENCOUNTER — Telehealth (HOSPITAL_COMMUNITY): Payer: Self-pay | Admitting: Occupational Therapy

## 2019-07-27 ENCOUNTER — Ambulatory Visit (HOSPITAL_COMMUNITY): Payer: Medicare Other | Admitting: Occupational Therapy

## 2019-07-27 NOTE — Telephone Encounter (Signed)
Chlorthalidone should not affect heart rates. How low were her heart rates getting and how was she checkking? Has she cut back significantly on her caffeine?   Tammy Abts MD

## 2019-07-27 NOTE — Telephone Encounter (Signed)
Pt cancelled appt for today because she has to take her fiance' to the doctor

## 2019-07-28 ENCOUNTER — Encounter (HOSPITAL_COMMUNITY): Payer: Medicare Other | Admitting: Physical Therapy

## 2019-07-28 ENCOUNTER — Ambulatory Visit (HOSPITAL_COMMUNITY): Payer: Medicare Other | Admitting: Occupational Therapy

## 2019-07-28 NOTE — Telephone Encounter (Signed)
Can strop chlorthalidone, start aldactone 25mg  daily. Needs BMET in 2 weeks  Zandra Abts MD

## 2019-07-28 NOTE — Telephone Encounter (Signed)
Returned pt call. She states that her heart rates were staying around 54-55. She was taking it with her blood pressure machine. She stated she has cut down to 1 cup of coffee and 1 can of soda daily. She has not had those issues prior to starting chlorthalidone and has not had it again since.

## 2019-08-01 ENCOUNTER — Telehealth (HOSPITAL_COMMUNITY): Payer: Self-pay | Admitting: Occupational Therapy

## 2019-08-01 NOTE — Telephone Encounter (Signed)
pt cancelled 1/5  because her husband has another appt at that time and they only have one vehicle

## 2019-08-02 ENCOUNTER — Encounter (HOSPITAL_COMMUNITY): Payer: Medicare Other | Admitting: Physical Therapy

## 2019-08-02 ENCOUNTER — Telehealth: Payer: Self-pay | Admitting: Family Medicine

## 2019-08-02 ENCOUNTER — Ambulatory Visit (HOSPITAL_COMMUNITY): Payer: Medicare Other | Admitting: Occupational Therapy

## 2019-08-02 NOTE — Telephone Encounter (Signed)
Routing to Dr. Corum for advice ? 

## 2019-08-02 NOTE — Telephone Encounter (Signed)
Evaluate in office-No way to assess ear without office visit

## 2019-08-02 NOTE — Telephone Encounter (Signed)
Patient is calling and states she sneezed yesterday and her right ear popped. Pt states it has been hurting since and she is having trouble hearing out of it. Would like to know if there is any recommendations that she would not have to come into the office or is Dr. Holly Bodily going to need to see her. Please advise,

## 2019-08-03 NOTE — Telephone Encounter (Signed)
Patient Is scheduled for office visit in the morning at 8:00 am she is aware

## 2019-08-04 ENCOUNTER — Ambulatory Visit (INDEPENDENT_AMBULATORY_CARE_PROVIDER_SITE_OTHER): Payer: Medicare Other | Admitting: Family Medicine

## 2019-08-04 ENCOUNTER — Other Ambulatory Visit: Payer: Self-pay

## 2019-08-04 ENCOUNTER — Encounter (HOSPITAL_COMMUNITY): Payer: Medicare Other | Admitting: Physical Therapy

## 2019-08-04 ENCOUNTER — Ambulatory Visit (HOSPITAL_COMMUNITY)
Payer: Medicare Other | Attending: Student in an Organized Health Care Education/Training Program | Admitting: Occupational Therapy

## 2019-08-04 VITALS — BP 146/84 | HR 66 | Temp 97.9°F | Ht 62.0 in | Wt 170.6 lb

## 2019-08-04 DIAGNOSIS — R29898 Other symptoms and signs involving the musculoskeletal system: Secondary | ICD-10-CM | POA: Insufficient documentation

## 2019-08-04 DIAGNOSIS — H6502 Acute serous otitis media, left ear: Secondary | ICD-10-CM | POA: Insufficient documentation

## 2019-08-04 MED ORDER — AMOXICILLIN-POT CLAVULANATE 875-125 MG PO TABS: 1.0000 | ORAL_TABLET | Freq: Two times a day (BID) | ORAL | 0 refills | Status: DC
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
## 2019-08-04 NOTE — Patient Instructions (Addendum)
Take Augmentin twice a day Aleve Be careful with positional change

## 2019-08-08 ENCOUNTER — Telehealth (HOSPITAL_COMMUNITY): Payer: Self-pay

## 2019-08-08 ENCOUNTER — Ambulatory Visit (HOSPITAL_COMMUNITY): Payer: Medicare Other

## 2019-08-08 NOTE — Telephone Encounter (Signed)
Pt agreed to change her apptments this wk to Wed @ 3:15 and Friday @ 2:30pm > NF

## 2019-08-09 ENCOUNTER — Telehealth: Payer: Self-pay | Admitting: Student in an Organized Health Care Education/Training Program

## 2019-08-09 ENCOUNTER — Encounter (HOSPITAL_COMMUNITY): Payer: Medicare Other | Admitting: Physical Therapy

## 2019-08-09 NOTE — Telephone Encounter (Addendum)
Patient lvmail on 08-05-19 at 11:05 stating she needed refills sent in for her tizanidine. I looked in chart and let her know she has refills on the last script, via vmail. Advised her to call back if she has any questions. Patient called back and stated she has already taken care of this.

## 2019-08-09 NOTE — Telephone Encounter (Signed)
Called pt. No answer. Left message for pt to return call.  

## 2019-08-10 ENCOUNTER — Ambulatory Visit (HOSPITAL_COMMUNITY): Payer: Medicare Other | Admitting: Occupational Therapy

## 2019-08-10 ENCOUNTER — Other Ambulatory Visit: Payer: Self-pay

## 2019-08-10 DIAGNOSIS — R29898 Other symptoms and signs involving the musculoskeletal system: Secondary | ICD-10-CM | POA: Diagnosis not present

## 2019-08-10 NOTE — Therapy (Signed)
[facility], [place], [address] Phone: [phone]   Fax:  [phone]  Occupational Therapy Treatment  Patient Details  Name: [name] MRN: [id number] Date of Birth: January 29, 1966 Referring Provider (OT): Gillis Santa MD   Encounter Date: 08/10/2019  OT End of Session - 08/10/19 1835    Visit Number  2    Number of Visits  12    Date for OT Re-Evaluation  08/29/19    Authorization Type  1) [employer] Medicare 2) medicaid    Authorization Time Period  no visit limit. . No copay. Complete progress note at visit 10    Authorization - Visit Number  2    Authorization - Number of Visits  10    OT Start Time  [id number]    OT Stop Time  1558    OT Time Calculation (min)  43 min    Activity Tolerance  Patient limited by pain    Behavior During Therapy  [facility] for tasks assessed/performed       Past Medical History:  Diagnosis Date  . Anxiety   . Arthritis    "pretty much all over; mainly neck and back" (12/26/2015)  . Asthma   . Bipolar 1 disorder ([place])   . Bipolar disorder ([place])   . Cervical cancer ([place])    "stage I"  . Chronic back pain   . Chronic bronchitis ([place])   . COPD (chronic obstructive pulmonary disease) ([place])   . Degenerative disc disease   . Depression   . Dysrhythmia   . Fibromyalgia   . GERD (gastroesophageal reflux disease)   . Hepatic cyst   . High cholesterol   . History of hiatal hernia   . Hypertension 08/04/2012  . IBS (irritable bowel syndrome) Diagnosed November 2012  . Incisional hernia with gangrene and obstruction 12/26/2015  . Melanoma of back ([place])   . Ovarian cancer ([place])    "stage II"  . Plantar fasciitis, bilateral   . Sleep apnea    cpap coming  "soon" (12/26/2015)    Past Surgical History:  Procedure Laterality Date  . ABDOMINAL HYSTERECTOMY  1997  . APPENDECTOMY    . CARDIAC CATHETERIZATION  2015  . CARPAL TUNNEL RELEASE Right   . CESAREAN SECTION  1987  . CHOLECYSTECTOMY  OPEN  1998  . COLONOSCOPY  November 2012  . EYE SURGERY     CATARACTS REMOVED 09/09/17 and 09/16/17  . HERNIA REPAIR    . INCISIONAL HERNIA REPAIR N/A 12/26/2015   Procedure: LAPAROSCOPIC REPAIR INCISIONAL HERNIA WITH MESH;  Surgeon: Fanny Skates, MD;  Location: [facility];  Service: General;  Laterality: N/A;  . INSERTION OF MESH N/A 12/26/2015   Procedure: INSERTION OF MESH;  Surgeon: Fanny Skates, MD;  Location: [facility];  Service: General;  Laterality: N/A;  . [place]  . LAPAROSCOPIC INCISIONAL / UMBILICAL / VENTRAL HERNIA REPAIR  12/26/2015   repair of incarcerated incisional hernia with mesh  . LIVER CYST REMOVAL  2014  . MELANOMA EXCISION  January 2013   removed from back  . SHOULDER SURGERY Left 2010   Humeral Head Microfracture   . TUBAL LIGATION    . UPPER GASTROINTESTINAL ENDOSCOPY  November 2012    There were no vitals filed for this visit.  Subjective Assessment - 08/10/19 1831    Subjective   I am having a bad day, my fibromyalgia is really flaring    Pertinent History  Patient is a 54 y/o female S/P left shoulder pain which has been ongoing for the past 6 months. Pt reports a history of Left RTC impingement and micro tears at the humeral head while undergoing surgery to repair it. She recently has been experiencing shoulder pain with no known cause for onset. Dr. Holley Raring has referred patient to occupational therapy for evaluation and treatment.    Patient Stated Goals  To have less pain and be able to complete activities with increased comfort.    Currently in Pain?  Yes    Pain Score  5     Pain Location  Shoulder    Pain Orientation  Left    Pain Descriptors / Indicators  Tingling;Radiating    Pain Type  Chronic pain    Pain Radiating Towards  left shoulder to hand    Pain Onset  Today    Pain Frequency  Intermittent    Aggravating Factors   positioning, stress         patient able to ambulate with SPC in left hand.  She is unable to  tolerate any light touch / manual therapy, PROM today due to her hypersensitivity.  She responded well to education related to posture and positioning for daily activities.  Reviewed and practiced self ROM activities utilizing SPC, towel, counter top towel slides in all directions.  She was able to complete AROM activities to shoulder height after stretching with less pain.                    OT Education - 08/10/19 1833    Education Details  reviewed positioning for daily activities such as needle work and computer activities, use of towel roll at mid back, elevating or adding surface for work, change of position and various options for stretching (utilzing SPC, towel etc)    Person(s) Educated  Patient    Methods  Explanation;Demonstration    Comprehension  Verbalized understanding;Returned demonstration       OT Short Term Goals - 07/18/19 1254      OT SHORT TERM GOAL #1   Title  Patient will be educated and independent with HEP in order to increase her functional use of her LUE and faciliate her progress in therapy.    Time  3    Period  Weeks    Status  New    Target Date  08/08/19      OT SHORT TERM GOAL #2   Title  Patient will increase LUE P/ROM to [facility] in order to be able to reach overhead with less difficulty.    Time  3    Period  Weeks    Status  New      OT SHORT TERM GOAL #3   Title  Patient will decrease fascial restrictions to moderate amount of less in order to increse functional mobility needed to complete reaching tasks.    Time  3    Period  Weeks    Status  New        OT Long Term Goals - 07/18/19 1306      OT LONG TERM GOAL #1   Title  Patient will return to highest level of independence with all basic ADL tasks while using her LUE as her non dominant extremity for 50% or more of daily tasks.    Time  6    Period  Weeks    Status  New    Target Date  08/29/19  OT LONG TERM GOAL #2   Title  Patient will increase A/ROM of her LUE to  [facility] in order to increase ability to her shirts on and off with less difficulty.    Time  6    Period  Weeks    Status  New      OT LONG TERM GOAL #3   Title  Patient will decrease pain level during functional use of her LUE to 3/10 or less on average.    Time  6    Period  Weeks    Status  New      OT LONG TERM GOAL #4   Title  Patient will increase LUE strength to 4/5 overall in order to complete daily household chores at a moderate difficulty level.    Time  6    Period  Weeks    Status  New      OT LONG TERM GOAL #5   Title  Patient will decrease fascial restrictions in the LUE to min amount or less in order to increase functional mobility needed to complete functional reaching tasks.    Time  6    Period  Weeks    Status  New            Plan - 08/10/19 1836    Clinical Impression Statement  patient with significant sensitivity to touch today due to pain/fibromyalgia.  she was able to participate and benefit from edcuation and practice with self stretch, positioning and posture    OT Treatment/Interventions  Self-care/ADL training;Therapeutic exercise;Manual Therapy;Neuromuscular education;Ultrasound;Therapeutic activities;DME and/or AE instruction;Cryotherapy;Electrical Stimulation;Moist Heat;Passive range of motion;Patient/family education    Plan  P: Patient will benefit from skilled OT services to increase functional performance and allow her to use her LUE as her nondominant extremity with less pain and difficulty. Treatment plan: myofascial release, manual stretching, P/ROM, AA/ROM, A/ROM, general shoulder strengthening. Modalities PRN.    Consulted and Agree with Plan of Care  Patient       Patient will benefit from skilled therapeutic intervention in order to improve the following deficits and impairments:           Visit Diagnosis: Other symptoms and signs involving the musculoskeletal system    Problem List Patient Active Problem List   Diagnosis Date  Noted  . Non-recurrent acute serous otitis media of left ear 08/04/2019  . Chronic pain of both shoulders 06/02/2019  . Primary osteoarthritis of left shoulder 06/02/2019  . H/O repair of left rotator cuff 06/02/2019  . Acute pansinusitis 05/03/2019  . Vitamin D deficiency 04/20/2019  . Colon cancer screening 02/15/2018  . Bipolar 1 disorder, mixed, moderate ([place]) 12/25/2017  . Prediabetes 10/01/2017  . Coronary artery disease 10/01/2017  . Hepatic cyst 08/18/2017  . Abdominal pain 08/18/2017  . Fibromyalgia 04/16/2017  . Sacroiliac joint pain 04/16/2017  . Incisional hernia with gangrene and obstruction 12/26/2015  . Incisional hernia with obstruction 12/26/2015  . OSA (obstructive sleep apnea) 12/13/2015  . Irritable bowel syndrome 09/25/2015  . Risk for falls 09/25/2015  . Insomnia 08/14/2015  . Hypertensive urgency 03/19/2015  . Unsteady gait 03/19/2015  . PTSD (post-traumatic stress disorder) 01/31/2014  . Chronic right-sided low back pain with right-sided sciatica 08/18/2013  . DDD (degenerative disc disease), cervical 08/18/2013  . DDD (degenerative disc disease), lumbosacral 08/18/2013  . Myalgia and myositis 08/18/2013  . Osteoarthritis 09/14/2012  . Asthma 09/14/2012  . Chronic obstructive pulmonary disease ([place]) 09/14/2012  . Gastro-esophageal reflux disease without esophagitis  09/14/2012  . Essential hypertension 09/14/2012  . Tobacco use 09/14/2012  . Insomnia due to mental disorder 05/28/2012    Carlos Levering 08/10/2019, 6:39 PM  [facility] [address] [place], [place], [address] Phone: [phone]   Fax:  [phone]  Name: [name] MRN: [id number] Date of Birth: [birth year]

## 2019-08-11 ENCOUNTER — Encounter (HOSPITAL_COMMUNITY): Payer: Medicare Other | Admitting: Physical Therapy

## 2019-08-12 ENCOUNTER — Ambulatory Visit (HOSPITAL_COMMUNITY): Payer: Medicare Other | Admitting: Psychiatry

## 2019-08-12 ENCOUNTER — Ambulatory Visit (HOSPITAL_COMMUNITY): Payer: Medicare Other | Admitting: Occupational Therapy

## 2019-08-15 ENCOUNTER — Telehealth (HOSPITAL_COMMUNITY): Payer: Self-pay | Admitting: Occupational Therapy

## 2019-08-15 ENCOUNTER — Ambulatory Visit (HOSPITAL_COMMUNITY): Payer: Medicare Other | Admitting: Occupational Therapy

## 2019-08-15 NOTE — Telephone Encounter (Signed)
Called and left message for pt regarding missed appts on Friday 1/15 and today, 1/18. Reminded pt of attendance policy and informed that we can only schedule 1 appt at a time now. Asked pt to call if unable to make next scheduled appt.    Guadelupe Sabin, OTR/L  [phone] 08/15/2019

## 2019-08-16 ENCOUNTER — Encounter (HOSPITAL_COMMUNITY): Payer: Medicare Other | Admitting: Physical Therapy

## 2019-08-17 ENCOUNTER — Telehealth (HOSPITAL_COMMUNITY): Payer: Self-pay | Admitting: Occupational Therapy

## 2019-08-17 ENCOUNTER — Encounter (HOSPITAL_COMMUNITY): Payer: Self-pay | Admitting: Occupational Therapy

## 2019-08-17 ENCOUNTER — Ambulatory Visit (HOSPITAL_COMMUNITY): Payer: Medicare Other | Admitting: Occupational Therapy

## 2019-08-17 NOTE — Therapy (Signed)
[place], [place], [address] Phone: [phone]   Fax:  [phone]  Patient Details  Name: [name] MRN: [id number] Date of Birth: 03/21/1966 Referring Provider:  No ref. provider found  Encounter Date: 08/17/2019  OCCUPATIONAL THERAPY DISCHARGE SUMMARY  Visits from Start of Care: 2  Current functional level related to goals / functional outcomes: Unknown. Pt attended evaluation and one treatment visit, has not attended any appts since 08/10/19. Pt is being discharged per attendance policy due to 3 consecutive no-shows.    Remaining deficits: Unknown     Education / Equipment: HEP at initial evaluation Plan: Patient agrees to discharge.  Patient goals were not met. Patient is being discharged due to not returning since the last visit.  ?????       Guadelupe Sabin, OTR/L  [phone] 08/17/2019, 11:39 AM  [place] [place], [place], [address] Phone: [phone]   Fax:  [phone]

## 2019-08-17 NOTE — Telephone Encounter (Signed)
Called pt and left message informing of discharge from OT services as she has missed 3 consecutive appts. No return phone calls after previous messages. Encouraged pt to call with any questions.    Guadelupe Sabin, OTR/L  [phone] 08/17/2019

## 2019-08-18 ENCOUNTER — Encounter (HOSPITAL_COMMUNITY): Payer: Medicare Other | Admitting: Physical Therapy

## 2019-08-22 ENCOUNTER — Encounter (HOSPITAL_COMMUNITY): Payer: Medicare Other

## 2019-08-23 ENCOUNTER — Encounter (HOSPITAL_COMMUNITY): Payer: Medicare Other | Admitting: Physical Therapy

## 2019-08-23 ENCOUNTER — Telehealth: Payer: Self-pay

## 2019-08-23 NOTE — Telephone Encounter (Signed)
Pt states she is waiting for a return call re:low pulse and palpatations. Please call [phone] Thank, renee

## 2019-08-23 NOTE — Telephone Encounter (Signed)
Returned pt call. Pt states that since stopping chlorthalidone her pulse has returned to normal. She does complain that she is having more frequent palpitations and they are more pronounced than before. She feels like her heart is beating out of her chest at times. She states that she is having episodes that last about 15-20 min each time. She is not able to take beta blockers due to bradycardia. Please advise.,

## 2019-08-23 NOTE — Telephone Encounter (Signed)
Typically any option we use would lower her heart rate more. Has she cut back her caffeine? Really needs to be aggressive in cutting back caffeine since we are limited in medication options. If has cut down dramatically would consider repeating her monitor, perhaps there is an alternative rhythm that is occurring that may have some alternative treatment options. Would she be open to a 1 week event monitor   J Cassondra Stachowski MD

## 2019-08-24 ENCOUNTER — Other Ambulatory Visit: Payer: Self-pay

## 2019-08-24 ENCOUNTER — Ambulatory Visit (HOSPITAL_COMMUNITY): Payer: Medicare Other

## 2019-08-24 ENCOUNTER — Telehealth: Payer: Self-pay

## 2019-08-24 ENCOUNTER — Encounter: Payer: Self-pay | Admitting: Student in an Organized Health Care Education/Training Program

## 2019-08-24 DIAGNOSIS — R002 Palpitations: Secondary | ICD-10-CM

## 2019-08-24 NOTE — Telephone Encounter (Signed)
Pt has cut down on caffeine. She agrees to wear an event monitor for 7 days. I will place order and have it mailed to her house.

## 2019-08-29 ENCOUNTER — Encounter: Payer: Self-pay | Admitting: Student in an Organized Health Care Education/Training Program

## 2019-08-29 ENCOUNTER — Ambulatory Visit (INDEPENDENT_AMBULATORY_CARE_PROVIDER_SITE_OTHER): Payer: Medicare Other | Admitting: Psychiatry

## 2019-08-29 ENCOUNTER — Other Ambulatory Visit: Payer: Self-pay

## 2019-08-29 ENCOUNTER — Ambulatory Visit
Payer: Medicare Other | Attending: Student in an Organized Health Care Education/Training Program | Admitting: Student in an Organized Health Care Education/Training Program

## 2019-08-29 ENCOUNTER — Encounter: Payer: Self-pay | Admitting: Psychiatry

## 2019-08-29 DIAGNOSIS — F4310 Post-traumatic stress disorder, unspecified: Secondary | ICD-10-CM | POA: Diagnosis not present

## 2019-08-29 DIAGNOSIS — M797 Fibromyalgia: Secondary | ICD-10-CM

## 2019-08-29 DIAGNOSIS — F3130 Bipolar disorder, current episode depressed, mild or moderate severity, unspecified: Secondary | ICD-10-CM | POA: Diagnosis not present

## 2019-08-29 DIAGNOSIS — G894 Chronic pain syndrome: Secondary | ICD-10-CM | POA: Diagnosis not present

## 2019-08-29 DIAGNOSIS — M47816 Spondylosis without myelopathy or radiculopathy, lumbar region: Secondary | ICD-10-CM | POA: Insufficient documentation

## 2019-08-29 MED ORDER — ARIPIPRAZOLE 5 MG PO TABS: 5.0000 mg | ORAL_TABLET | Freq: Every day | ORAL | 0 refills | Status: DC

## 2019-08-29 NOTE — Progress Notes (Signed)
Patient: [name]  Service Category: E/M  Provider: Gillis Santa, MD  DOB: 08/30/1965  DOS: 08/29/2019  Location: Office  MRN: [id number]  Setting: Ambulatory outpatient  Referring Provider: Maryruth Hancock, MD  Type: Established Patient  Specialty: Interventional Pain Management  PCP: No primary care provider on file.  Location: Home  Delivery: TeleHealth     Virtual Encounter - Pain Management PROVIDER NOTE: Information contained herein reflects review and annotations entered in association with encounter. Interpretation of such information and data should be left to medically-trained personnel. Information provided to patient can be located elsewhere in the medical record under "Patient Instructions". Document created using STT-dictation technology, any transcriptional errors that may result from process are unintentional.    Contact & Pharmacy Preferred: [phone] Home: [phone] (home) Mobile: [phone] (mobile) E-mail: [email or web address]  [place], [address] [place] [address] Phone: [phone] Fax: [phone]   Pre-screening  [name] offered "in-person" vs "virtual" encounter. She indicated preferring virtual for this encounter.   Reason COVID-19*  Social distancing based on CDC and AMA recommendations.   I contacted IMOGENE MACCARONE on 08/29/2019 via telephone.      I clearly identified myself as Gillis Santa, MD. I verified that I was speaking with the correct person using two identifiers (Name: [name], and date of birth: [birth year]).  This visit was completed via telephone due to the restrictions of the COVID-19 pandemic. All issues as above were discussed and addressed but no physical exam was performed. If it was felt that the patient should be evaluated in the office, they were directed there. The patient verbally consented to this visit. Patient was unable to complete an audio/visual visit due to  Technical difficulties and/or Lack of internet. Due to the catastrophic nature of the COVID-19 pandemic, this visit was done through audio contact only.  Location of the patient: home address (see Epic for details)  Location of the provider: office  Consent I sought verbal advanced consent from [name] for virtual visit interactions. I informed Ms. Toutant of possible security and privacy concerns, risks, and limitations associated with providing "not-in-person" medical evaluation and management services. I also informed Ms. Nauman of the availability of "in-person" appointments. Finally, I informed her that there would be a charge for the virtual visit and that she could be  personally, fully or partially, financially responsible for it. Ms. Edgeman expressed understanding and agreed to proceed.   Historic Elements   [name] is a 54 y.o. year old, female patient evaluated today after her last encounter by our practice on 08/24/2019. [name]  has a past medical history of Anxiety, Arthritis, Asthma, Bipolar 1 disorder ([place]), Bipolar disorder ([place]), Cervical cancer ([place]), Chronic back pain, Chronic bronchitis ([place]), COPD (chronic obstructive pulmonary disease) ([place]), Degenerative disc disease, Depression, Dysrhythmia, Fibromyalgia, GERD (gastroesophageal reflux disease), Hepatic cyst, High cholesterol, History of hiatal hernia, Hypertension (08/04/2012), IBS (irritable bowel syndrome) (Diagnosed November 2012), Incisional hernia with gangrene and obstruction (12/26/2015), Melanoma of back ([place]), Ovarian cancer ([place]), Plantar fasciitis, bilateral, and Sleep apnea. She also  has a past surgical history that includes Shoulder surgery (Left, 2010); Abdominal hysterectomy (1997); Laparoscopic abdominal exploration (1997); Cesarean section (1987); Appendectomy; Upper gastrointestinal endoscopy (November 2012); Colonoscopy (November 2012); Melanoma excision (January 2013); Tubal ligation; Carpal  tunnel release (Right); Liver cyst removal (2014); Hernia repair; Laparoscopic incisional / umbilical / ventral hernia repair (12/26/2015); Cholecystectomy open (1998); Cardiac  catheterization (2015); Incisional hernia repair (N/A, 12/26/2015); Insertion of mesh (N/A, 12/26/2015); and Eye surgery. [name] has a current medication list which includes the following prescription(s): epinephrine, hydroxyzine, losartan, tizanidine, amoxicillin-clavulanate, and aripiprazole. She  reports that she quit smoking about 14 months ago. Her smoking use included cigarettes. She smoked 0.00 packs per day for 0.00 years. She uses smokeless tobacco. She reports that she does not drink alcohol or use drugs. [name] is allergic to barium-containing compounds; bee venom; flu virus vaccine; seldane [terfenadine]; sulfa antibiotics; lisinopril; lithium; tetracyclines & related; celebrex [celecoxib]; trazodone and nefazodone; zinc; aspirin; ativan [lorazepam]; erythromycin; and lipitor [atorvastatin].   HPI  Today, she is being contacted for a post-procedure assessment.  Evaluation of last interventional procedure  07/20/19 Procedure:   Type: Lumbar Facet, Medial Branch Block(s) #1  Primary Purpose: Diagnostic Region: Posterolateral Lumbosacral Spine Level:L3, L4, L5, & S1 Medial Branch Level(s). Injecting these levels blocks the L3-4, L4-5, and L5-S1 lumbar facet joints. Laterality: Bilateral   Sedation: Please see nurses note for DOS. When no sedatives are used, the analgesic levels obtained are directly associated to the effectiveness of the local anesthetics. However, when sedation is provided, the level of analgesia obtained during the initial 1 hour following the intervention, is believed to be the result of a combination of factors. These factors may include, but are not limited to: 1. The effectiveness of the local anesthetics used. 2. The effects of the analgesic(s) and/or anxiolytic(s) used. 3. The degree of  discomfort experienced by the patient at the time of the procedure. 4. The patients ability and reliability in recalling and recording the events. 5. The presence and influence of possible secondary gains and/or psychosocial factors. Reported result: Relief experienced during the 1st hour after the procedure: 100 % (Ultra-Short Term Relief)            Interpretative annotation: Clinically appropriate result. Analgesia during this period is likely to be Local Anesthetic and/or IV Sedative (Analgesic/Anxiolytic) related.          Effects of local anesthetic: The analgesic effects attained during this period are directly associated to the localized infiltration of local anesthetics and therefore cary significant diagnostic value as to the etiological location, or anatomical origin, of the pain. Expected duration of relief is directly dependent on the pharmacodynamics of the local anesthetic used. Long-acting (4-6 hours) anesthetics used.  Reported result: Relief during the next 4 to 6 hour after the procedure: 100 % (Short-Term Relief)            Interpretative annotation: Clinically appropriate result. Analgesia during this period is likely to be Local Anesthetic-related.          Long-term benefit: Defined as the period of time past the expected duration of local anesthetics (1 hour for short-acting and 4-6 hours for long-acting). With the possible exception of prolonged sympathetic blockade from the local anesthetics, benefits during this period are typically attributed to, or associated with, other factors such as analgesic sensory neuropraxia, antiinflammatory effects, or beneficial biochemical changes provided by agents other than the local anesthetics.  Reported result: Extended relief following procedure: 75 %(lasting 6 days) (Long-Term Relief)            Interpretative annotation: Clinically appropriate result. Good relief. No permanent benefit expected. Inflammation plays a part in the etiology to  the pain.          Repeat block #2 and compare results.  Tentative plan for radiofrequency ablation after diagnostic lumbar facet medial branch  nerve block #2   Laboratory Chemistry Profile (12 mo)  Renal: 12/28/2018: BUN 8; Creatinine 0.8  Lab Results  Component Value Date   GFRAA >60 12/08/2017   GFRNONAA >60 12/08/2017   Hepatic: No results found for requested labs within last 8760 hours. Lab Results  Component Value Date   AST 21 12/28/2018   ALT 18 12/28/2018   Other: 12/28/2018: Vit D, 25-Hydroxy 18.9  Note: Above Lab results reviewed.   Assessment  The primary encounter diagnosis was Lumbar facet arthropathy (L4/5 and L5/S1). Diagnoses of Chronic pain syndrome and Fibromyalgia were also pertinent to this visit.  Plan of Care  Problem-specific:  Lumbar facet arthropathy (L4/5 and L5/S1) Patient is status post diagnostic lumbar facet medial branch nerve block #1 on July 20, 2019.  This was done bilaterally at L3, L4, L5, S1.  Patient endorses significant pain relief, rated as approximately 75% lasting 1 week with gradual return of pain thereafter.  She also states that her ability to perform ADLs was improved during this week as she had less pain with household chores.  Recommend repeating diagnostic lumbar facet medial branch nerve block #2 and then considering lumbar frequency ablation/neurotomy  Orders Placed This Encounter  Procedures  . LUMBAR FACET(MEDIAL BRANCH NERVE BLOCK) MBNB    Standing Status:   Future    Standing Expiration Date:   09/26/2019    Scheduling Instructions:     Procedure: Lumbar facet block (AKA.: Lumbosacral medial branch nerve block)     Side: Bilateral     Level: L3-4, L4-5, & L5-S1 Facets ( L3, L4, L5, & S1 Medial Branch Nerves)     Sedation: with     Timeframe: ASAA    Order Specific Question:   Where will this procedure be performed?    Answer:   [facility]     I am having Jetty Duhamel. Yano "Sandy" maintain her losartan,  hydrOXYzine, EPINEPHrine, tiZANidine, and amoxicillin-clavulanate.  Orders:  Orders Placed This Encounter  Procedures  . LUMBAR FACET(MEDIAL BRANCH NERVE BLOCK) MBNB    Standing Status:   Future    Standing Expiration Date:   09/26/2019    Scheduling Instructions:     Procedure: Lumbar facet block (AKA.: Lumbosacral medial branch nerve block)     Side: Bilateral     Level: L3-4, L4-5, & L5-S1 Facets ( L3, L4, L5, & S1 Medial Branch Nerves)     Sedation: with     Timeframe: ASAA    Order Specific Question:   Where will this procedure be performed?    Answer:   [facility]   Follow-up plan:   Return in about 1 week (around 09/05/2019) for B/L L3-S1 Fcts #2, with sedation.     Status post diagnostic lumbar facet medial branch nerve blocks at L3, L4, L5, S1 on 07/20/2019-75% pain relief for 1 week, repeat #2    Recent Visits Date Type Provider Dept  07/20/19 Procedure visit Gillis Santa, MD [facility]  06/29/19 Office Visit Gillis Santa, MD [facility]  06/02/19 Office Visit Gillis Santa, MD [facility]  Showing recent visits within past 90 days and meeting all other requirements   Today's Visits Date Type Provider Dept  08/29/19 Office Visit Gillis Santa, MD [facility]  Showing today's visits and meeting all other requirements   Future Appointments No visits were found meeting these conditions.  Showing future appointments within next 90 days and meeting all other requirements   I  discussed the assessment and treatment plan with the patient. The patient was provided an opportunity to ask questions and all were answered. The patient agreed with the plan and demonstrated an understanding of the instructions.  Patient advised to call back or seek an in-person evaluation if the symptoms or condition worsens.  Duration of encounter: 30 minutes.  Note by: Gillis Santa, MD Date: 08/29/2019; Time: 2:10 PM

## 2019-08-29 NOTE — Assessment & Plan Note (Signed)
Patient is status post diagnostic lumbar facet medial branch nerve block #1 on July 20, 2019.  This was done bilaterally at L3, L4, L5, S1.  Patient endorses significant pain relief, rated as approximately 75% lasting 1 week with gradual return of pain thereafter.  She also states that her ability to perform ADLs was improved during this week as she had less pain with household chores.  Recommend repeating diagnostic lumbar facet medial branch nerve block #2 and then considering lumbar frequency ablation/neurotomy  Orders Placed This Encounter  Procedures  . LUMBAR FACET(MEDIAL BRANCH NERVE BLOCK) MBNB    Standing Status:   Future    Standing Expiration Date:   09/26/2019    Scheduling Instructions:     Procedure: Lumbar facet block (AKA.: Lumbosacral medial branch nerve block)     Side: Bilateral     Level: L3-4, L4-5, & L5-S1 Facets ( L3, L4, L5, & S1 Medial Branch Nerves)     Sedation: with     Timeframe: ASAA    Order Specific Question:   Where will this procedure be performed?    Answer:   [facility]
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
## 2019-08-30 ENCOUNTER — Encounter (HOSPITAL_COMMUNITY): Payer: Medicare Other | Admitting: Occupational Therapy

## 2019-09-01 ENCOUNTER — Encounter (HOSPITAL_COMMUNITY): Payer: Medicare Other | Admitting: Occupational Therapy

## 2019-09-02 ENCOUNTER — Ambulatory Visit (INDEPENDENT_AMBULATORY_CARE_PROVIDER_SITE_OTHER): Payer: Medicare Other

## 2019-09-02 DIAGNOSIS — R002 Palpitations: Secondary | ICD-10-CM

## 2019-09-05 ENCOUNTER — Encounter (HOSPITAL_COMMUNITY): Payer: Medicare Other

## 2019-09-06 ENCOUNTER — Other Ambulatory Visit: Payer: Self-pay

## 2019-09-07 ENCOUNTER — Encounter: Payer: Self-pay | Admitting: Student in an Organized Health Care Education/Training Program

## 2019-09-07 ENCOUNTER — Encounter (HOSPITAL_COMMUNITY): Payer: Medicare Other

## 2019-09-07 ENCOUNTER — Ambulatory Visit (HOSPITAL_BASED_OUTPATIENT_CLINIC_OR_DEPARTMENT_OTHER): Payer: Medicare Other | Admitting: Student in an Organized Health Care Education/Training Program

## 2019-09-07 ENCOUNTER — Other Ambulatory Visit: Payer: Self-pay

## 2019-09-07 ENCOUNTER — Ambulatory Visit
Admission: RE | Admit: 2019-09-07 | Discharge: 2019-09-07 | Disposition: A | Discharge status: Home / Self Care | Payer: Medicare Other | Source: Ambulatory Visit | Attending: Student in an Organized Health Care Education/Training Program | Admitting: Student in an Organized Health Care Education/Training Program

## 2019-09-07 VITALS — BP 161/88 | HR 70 | Temp 97.3°F | Resp 16 | Ht 62.0 in | Wt 162.0 lb

## 2019-09-07 DIAGNOSIS — M47816 Spondylosis without myelopathy or radiculopathy, lumbar region: Secondary | ICD-10-CM | POA: Insufficient documentation

## 2019-09-07 MED ORDER — ROPIVACAINE HCL 2 MG/ML IJ SOLN
9.0000 mL | Freq: Once | INTRAMUSCULAR | Status: AC
  Administered 2019-09-07: 10 mL via PERINEURAL
  Filled 2019-09-07: qty 10

## 2019-09-07 MED ORDER — ROPIVACAINE HCL 2 MG/ML IJ SOLN
9.0000 mL | Freq: Once | INTRAMUSCULAR | Status: AC
  Administered 2019-09-07: 09:00:00 10 mL via PERINEURAL
  Filled 2019-09-07: qty 10

## 2019-09-07 MED ORDER — FENTANYL CITRATE (PF) 100 MCG/2ML IJ SOLN
25.0000 ug | INTRAMUSCULAR | Status: DC | PRN
  Filled 2019-09-07: qty 2

## 2019-09-07 MED ORDER — MIDAZOLAM HCL 5 MG/5ML IJ SOLN
1.0000 mg | INTRAMUSCULAR | Status: DC | PRN
  Filled 2019-09-07: qty 5

## 2019-09-07 MED ORDER — DEXAMETHASONE SODIUM PHOSPHATE 10 MG/ML IJ SOLN
10.0000 mg | Freq: Once | INTRAMUSCULAR | Status: AC
  Administered 2019-09-07: 10 mg
  Filled 2019-09-07: qty 1

## 2019-09-07 MED ORDER — DEXAMETHASONE SODIUM PHOSPHATE 10 MG/ML IJ SOLN
10.0000 mg | Freq: Once | INTRAMUSCULAR | Status: AC
  Administered 2019-09-07: 09:00:00 10 mg
  Filled 2019-09-07: qty 1

## 2019-09-07 MED ORDER — LIDOCAINE HCL 2 % IJ SOLN
20.0000 mL | Freq: Once | INTRAMUSCULAR | Status: AC
  Administered 2019-09-07: 09:00:00 400 mg
  Filled 2019-09-07: qty 20

## 2019-09-07 NOTE — Progress Notes (Signed)
Patient's Name: [name]  MRN: [id number]  Referring Provider: No ref. provider found  DOB: 1966-06-06  PCP: Cletis Athens, MD  DOS: 09/07/2019  Note by: Gillis Santa, MD  Service setting: Ambulatory outpatient  Specialty: Interventional Pain Management  Patient type: Established  Location: [facility] (AMB) Pain Management Facility  Visit type: Interventional Procedure   Primary Reason for Visit: Interventional Pain Management Treatment. CC: Back Pain (low)  Procedure:          Anesthesia, Analgesia, Anxiolysis:  Type: Lumbar Facet, Medial Branch Block(s) #2  Primary Purpose: Diagnostic Region: Posterolateral Lumbosacral Spine Level:L3, L4, L5, & S1 Medial Branch Level(s). Injecting these levels blocks the L3-4, L4-5, and L5-S1 lumbar facet joints. Laterality: Bilateral  Type: Moderate (Conscious) Sedation combined with Local Anesthesia Indication(s): Analgesia and Anxiety Route: Intravenous (IV) IV Access: Secured Sedation: Meaningful verbal contact was maintained at all times during the procedure  Local Anesthetic: Lidocaine 1-2%  Position: Prone   Indications: 1. Lumbar facet arthropathy (L4/5 and L5/S1)    Pain Score: Pre-procedure: 8 /10 Post-procedure: 0-No pain/10   Pre-op Assessment:  [name] is a 54 y.o. (year old), female patient, seen today for interventional treatment. She  has a past surgical history that includes Shoulder surgery (Left, 2010); Abdominal hysterectomy (1997); Laparoscopic abdominal exploration (1997); Cesarean section (1987); Appendectomy; Upper gastrointestinal endoscopy (November 2012); Colonoscopy (November 2012); Melanoma excision (January 2013); Tubal ligation; Carpal tunnel release (Right); Liver cyst removal (2014); Hernia repair; Laparoscopic incisional / umbilical / ventral hernia repair (12/26/2015); Cholecystectomy open (1998); Cardiac catheterization (2015); Incisional hernia repair (N/A, 12/26/2015); Insertion of mesh (N/A, 12/26/2015); and  Eye surgery. [name] has a current medication list which includes the following prescription(s): aripiprazole, epinephrine, hydroxyzine, losartan-hydrochlorothiazide, tizanidine, amoxicillin-clavulanate, and losartan, and the following Facility-Administered Medications: fentanyl and midazolam. Her primarily concern today is the Back Pain (low)  Initial Vital Signs:  Pulse/HCG Rate: 70ECG Heart Rate: 69 Temp: (!) 97.3 F (36.3 C) Resp: 18 BP: (!) 175/91 SpO2: 100 %  BMI: Estimated body mass index is 29.63 kg/m as calculated from the following:   Height as of this encounter: 5\' 2"  (1.575 m).   Weight as of this encounter: 162 lb (73.5 kg).  Risk Assessment: Allergies: Reviewed. She is allergic to barium-containing compounds; bee venom; flu virus vaccine; seldane [terfenadine]; sulfa antibiotics; lisinopril; lithium; tetracyclines & related; celebrex [celecoxib]; trazodone and nefazodone; zinc; aspirin; ativan [lorazepam]; erythromycin; and lipitor [atorvastatin].  Allergy Precautions: None required Coagulopathies: Reviewed. None identified.  Blood-thinner therapy: None at this time Active Infection(s): Reviewed. None identified. [name] is afebrile  Site Confirmation: [name] was asked to confirm the procedure and laterality before marking the site Procedure checklist: Completed Consent: Before the procedure and under the influence of no sedative(s), amnesic(s), or anxiolytics, the patient was informed of the treatment options, risks and possible complications. To fulfill our ethical and legal obligations, as recommended by the American Medical Association's Code of Ethics, I have informed the patient of my clinical impression; the nature and purpose of the treatment or procedure; the risks, benefits, and possible complications of the intervention; the alternatives, including doing nothing; the risk(s) and benefit(s) of the alternative treatment(s) or procedure(s); and the risk(s) and  benefit(s) of doing nothing. The patient was provided information about the general risks and possible complications associated with the procedure. These may include, but are not limited to: failure to achieve desired goals, infection, bleeding, organ or nerve damage, allergic reactions, paralysis, and death. In addition, the patient  was informed of those risks and complications associated to Spine-related procedures, such as failure to decrease pain; infection (i.e.: Meningitis, epidural or intraspinal abscess); bleeding (i.e.: epidural hematoma, subarachnoid hemorrhage, or any other type of intraspinal or peri-dural bleeding); organ or nerve damage (i.e.: Any type of peripheral nerve, nerve root, or spinal cord injury) with subsequent damage to sensory, motor, and/or autonomic systems, resulting in permanent pain, numbness, and/or weakness of one or several areas of the body; allergic reactions; (i.e.: anaphylactic reaction); and/or death. Furthermore, the patient was informed of those risks and complications associated with the medications. These include, but are not limited to: allergic reactions (i.e.: anaphylactic or anaphylactoid reaction(s)); adrenal axis suppression; blood sugar elevation that in diabetics may result in ketoacidosis or comma; water retention that in patients with history of congestive heart failure may result in shortness of breath, pulmonary edema, and decompensation with resultant heart failure; weight gain; swelling or edema; medication-induced neural toxicity; particulate matter embolism and blood vessel occlusion with resultant organ, and/or nervous system infarction; and/or aseptic necrosis of one or more joints. Finally, the patient was informed that Medicine is not an exact science; therefore, there is also the possibility of unforeseen or unpredictable risks and/or possible complications that may result in a catastrophic outcome. The patient indicated having understood very  clearly. We have given the patient no guarantees and we have made no promises. Enough time was given to the patient to ask questions, all of which were answered to the patient's satisfaction. Ms. Demler has indicated that she wanted to continue with the procedure. Attestation: I, the ordering provider, attest that I have discussed with the patient the benefits, risks, side-effects, alternatives, likelihood of achieving goals, and potential problems during recovery for the procedure that I have provided informed consent. Date  Time: 09/07/2019  8:48 AM  Pre-Procedure Preparation:  Monitoring: As per clinic protocol. Respiration, ETCO2, SpO2, BP, heart rate and rhythm monitor placed and checked for adequate function Safety Precautions: Patient was assessed for positional comfort and pressure points before starting the procedure. Time-out: I initiated and conducted the "Time-out" before starting the procedure, as per protocol. The patient was asked to participate by confirming the accuracy of the "Time Out" information. Verification of the correct person, site, and procedure were performed and confirmed by me, the nursing staff, and the patient. "Time-out" conducted as per Joint Commission's Universal Protocol (UP.01.01.01). Time: 0918  Description of Procedure:          Laterality: Bilateral. The procedure was performed in identical fashion on both sides. Levels:  L3, L4, L5, & S1 Medial Branch Level(s) Area Prepped: Posterior Lumbosacral Region Prepping solution: DuraPrep (Iodine Povacrylex [0.7% available iodine] and Isopropyl Alcohol, 74% w/w) Safety Precautions: Aspiration looking for blood return was conducted prior to all injections. At no point did we inject any substances, as a needle was being advanced. Before injecting, the patient was told to immediately notify me if she was experiencing any new onset of "ringing in the ears, or metallic taste in the mouth". No attempts were made at seeking  any paresthesias. Safe injection practices and needle disposal techniques used. Medications properly checked for expiration dates. SDV (single dose vial) medications used. After the completion of the procedure, all disposable equipment used was discarded in the proper designated medical waste containers. Local Anesthesia: Protocol guidelines were followed. The patient was positioned over the fluoroscopy table. The area was prepped in the usual manner. The time-out was completed. The target area was identified using fluoroscopy.  A 12-in long, straight, sterile hemostat was used with fluoroscopic guidance to locate the targets for each level blocked. Once located, the skin was marked with an approved surgical skin marker. Once all sites were marked, the skin (epidermis, dermis, and hypodermis), as well as deeper tissues (fat, connective tissue and muscle) were infiltrated with a small amount of a short-acting local anesthetic, loaded on a 10cc syringe with a 25G, 1.5-in  Needle. An appropriate amount of time was allowed for local anesthetics to take effect before proceeding to the next step. Local Anesthetic: Lidocaine 2.0% The unused portion of the local anesthetic was discarded in the proper designated containers. Technical explanation of process:   L3 Medial Branch Nerve Block (MBB): The target area for the L3 medial branch is at the junction of the postero-lateral aspect of the superior articular process and the superior, posterior, and medial edge of the transverse process of L4. Under fluoroscopic guidance, a Quincke needle was inserted until contact was made with os over the superior postero-lateral aspect of the pedicular shadow (target area). After negative aspiration for blood, 1 mL of the nerve block solution was injected without difficulty or complication. The needle was removed intact. L4 Medial Branch Nerve Block (MBB): The target area for the L4 medial branch is at the junction of the  postero-lateral aspect of the superior articular process and the superior, posterior, and medial edge of the transverse process of L5. Under fluoroscopic guidance, a Quincke needle was inserted until contact was made with os over the superior postero-lateral aspect of the pedicular shadow (target area). After negative aspiration for blood, 1 mL of the nerve block solution was injected without difficulty or complication. The needle was removed intact. L5 Medial Branch Nerve Block (MBB): The target area for the L5 medial branch is at the junction of the postero-lateral aspect of the superior articular process and the superior, posterior, and medial edge of the sacral ala. Under fluoroscopic guidance, a Quincke needle was inserted until contact was made with os over the superior postero-lateral aspect of the pedicular shadow (target area). After negative aspiration for blood, 95mL of the nerve block solution was injected without difficulty or complication. The needle was removed intact. S1 Medial Branch Nerve Block (MBB): The target area for the S1 medial branch is at the posterior and inferior 6 o'clock position of the L5-S1 facet joint. Under fluoroscopic guidance, the Quincke needle inserted for the L5 MBB was redirected until contact was made with os over the inferior and postero aspect of the sacrum, at the 6 o' clock position under the L5-S1 facet joint (Target area). After negative aspiration for blood, 71mL of the nerve block solution was injected without difficulty or complication. The needle was removed intact.  Nerve block solution: 10 cc solution made of 8 cc of 0.2% ropivacaine, 2 cc of Decadron 10 mg/cc.  1 to 1.5 cc injected at each level above bilaterally.  The unused portion of the solution was discarded in the proper designated containers. Procedural Needles: 22-gauge, 3.5-inch, Quincke needles used for all levels.  Once the entire procedure was completed, the treated area was cleaned, making  sure to leave some of the prepping solution back to take advantage of its long term bactericidal properties.    Illustration of the posterior view of the lumbar spine and the posterior neural structures. Laminae of L2 through S1 are labeled. DPRL5, dorsal primary ramus of L5; DPRS1, dorsal primary ramus of S1; DPR3, dorsal primary ramus of L3; FJ,  facet (zygapophyseal) joint L3-L4; I, inferior articular process of L4; LB1, lateral branch of dorsal primary ramus of L1; IAB, inferior articular branches from L3 medial branch (supplies L4-L5 facet joint); IBP, intermediate branch plexus; MB3, medial branch of dorsal primary ramus of L3; NR3, third lumbar nerve root; S, superior articular process of L5; SAB, superior articular branches from L4 (supplies L4-5 facet joint also); TP3, transverse process of L3.  Vitals:   09/07/19 0937 09/07/19 0946 09/07/19 0955 09/07/19 1005  BP: (!) 167/108 (!) 156/87 (!) 167/95 (!) 161/88  Pulse:      Resp: 16 17 16 16   Temp:      SpO2: 98% 100% 100% 100%  Weight:      Height:         Start Time: 0918 hrs. End Time: 0937 hrs.  Imaging Guidance (Spinal):          Type of Imaging Technique: Fluoroscopy Guidance (Spinal) Indication(s): Assistance in needle guidance and placement for procedures requiring needle placement in or near specific anatomical locations not easily accessible without such assistance. Exposure Time: Please see nurses notes. Contrast: None used. Fluoroscopic Guidance: I was personally present during the use of fluoroscopy. "Tunnel Vision Technique" used to obtain the best possible view of the target area. Parallax error corrected before commencing the procedure. "Direction-depth-direction" technique used to introduce the needle under continuous pulsed fluoroscopy. Once target was reached, antero-posterior, oblique, and lateral fluoroscopic projection used confirm needle placement in all planes. Images permanently stored in EMR. Interpretation:  No contrast injected. I personally interpreted the imaging intraoperatively. Adequate needle placement confirmed in multiple planes. Permanent images saved into the patient's record.  Antibiotic Prophylaxis:   Anti-infectives (From admission, onward)   None     Indication(s): None identified  Post-operative Assessment:  Post-procedure Vital Signs:  Pulse/HCG Rate: 7077 Temp: (!) 97.3 F (36.3 C) Resp: 16 BP: (!) 161/88 SpO2: 100 %  EBL: None  Complications: No immediate post-treatment complications observed by team, or reported by patient.  Note: The patient tolerated the entire procedure well. A repeat set of vitals were taken after the procedure and the patient was kept under observation following institutional policy, for this type of procedure. Post-procedural neurological assessment was performed, showing return to baseline, prior to discharge. The patient was provided with post-procedure discharge instructions, including a section on how to identify potential problems. Should any problems arise concerning this procedure, the patient was given instructions to immediately contact us, at any time, without hesitation. In any case, we plan to contact the patient by telephone for a follow-up status report regarding this interventional procedure.  Comments:  No additional relevant information.  Plan of Care  Orders:  Orders Placed This Encounter  Procedures  . DG PAIN [facility] C-ARM 1-60 MIN NO REPORT    Intraoperative interpretation by procedural physician at [facility].    Standing Status:   Standing    Number of Occurrences:   1    Order Specific Question:   Reason for exam:    Answer:   Assistance in needle guidance and placement for procedures requiring needle placement in or near specific anatomical locations not easily accessible without such assistance.   Medications ordered for procedure: Meds ordered this encounter  Medications  . lidocaine (XYLOCAINE) 2 %  (with pres) injection 400 mg  . midazolam (VERSED) 5 MG/5ML injection 1-2 mg    Make sure Flumazenil is available in the pyxis when using this medication. If oversedation occurs, administer 0.2  mg IV over 15 sec. If after 45 sec no response, administer 0.2 mg again over 1 min; may repeat at 1 min intervals; not to exceed 4 doses (1 mg)  . fentaNYL (SUBLIMAZE) injection 25-50 mcg    Make sure Narcan is available in the pyxis when using this medication. In the event of respiratory depression (RR< 8/min): Titrate NARCAN (naloxone) in increments of 0.1 to 0.2 mg IV at 2-3 minute intervals, until desired degree of reversal.  . dexamethasone (DECADRON) injection 10 mg  . dexamethasone (DECADRON) injection 10 mg  . ropivacaine (PF) 2 mg/mL (0.2%) (NAROPIN) injection 9 mL  . ropivacaine (PF) 2 mg/mL (0.2%) (NAROPIN) injection 9 mL   Medications administered: We administered lidocaine, dexamethasone, dexamethasone, ropivacaine (PF) 2 mg/mL (0.2%), and ropivacaine (PF) 2 mg/mL (0.2%).  See the medical record for exact dosing, route, and time of administration.  Follow-up plan:   Return in about 4 weeks (around 10/05/2019) for Post Procedure Evaluation, virtual.      Status post diagnostic lumbar facet medial branch nerve blocks at L3, L4, L5, S1 on 07/20/2019,  #2 on 09/07/19   Recent Visits Date Type Provider Dept  08/29/19 Office Visit Gillis Santa, MD [facility]  07/20/19 Procedure visit Gillis Santa, MD [facility]  06/29/19 Office Visit Gillis Santa, MD [facility]  Showing recent visits within past 90 days and meeting all other requirements   Today's Visits Date Type Provider Dept  09/07/19 Procedure visit Gillis Santa, MD [facility]  Showing today's visits and meeting all other requirements   Future Appointments Date Type Provider Dept  10/05/19 Appointment Gillis Santa, MD [facility]  Showing future appointments within next 90  days and meeting all other requirements   Disposition: Discharge home  Discharge Date & Time: 09/07/2019; 1005 hrs.   Primary Care Physician: Cletis Athens, MD Location: [facility] Note by: Gillis Santa, MD Date: 09/07/2019; Time: 10:39 AM  Disclaimer:  Medicine is not an exact science. The only guarantee in medicine is that nothing is guaranteed. It is important to note that the decision to proceed with this intervention was based on the information collected from the patient. The Data and conclusions were drawn from the patient's questionnaire, the interview, and the physical examination. Because the information was provided in large part by the patient, it cannot be guaranteed that it has not been purposely or unconsciously manipulated. Every effort has been made to obtain as much relevant data as possible for this evaluation. It is important to note that the conclusions that lead to this procedure are derived in large part from the available data. Always take into account that the treatment will also be dependent on availability of resources and existing treatment guidelines, considered by other Pain Management Practitioners as being common knowledge and practice, at the time of the intervention. For Medico-Legal purposes, it is also important to point out that variation in procedural techniques and pharmacological choices are the acceptable norm. The indications, contraindications, technique, and results of the above procedure should only be interpreted and judged by a Board-Certified Interventional Pain Specialist with extensive familiarity and expertise in the same exact procedure and technique.

## 2019-09-07 NOTE — Progress Notes (Signed)
Safety precautions to be maintained throughout the outpatient stay will include: orient to surroundings, keep bed in low position, maintain call bell within reach at all times, provide assistance with transfer out of bed and ambulation.  

## 2019-09-07 NOTE — Patient Instructions (Signed)

## 2019-09-08 ENCOUNTER — Telehealth: Payer: Self-pay

## 2019-09-08 NOTE — Telephone Encounter (Signed)
Post procedure phone call.  LM 

## 2019-09-12 ENCOUNTER — Telehealth: Payer: Self-pay | Admitting: Student in an Organized Health Care Education/Training Program

## 2019-09-12 NOTE — Telephone Encounter (Signed)
I'd recommend she utilize her Tizanidine for the next 2-3 days as its likely MSK related. She can also supplement with APAP 1000 mg BID prn (or 500 mg QID prn). If not improved in the next couple of days, please let me know

## 2019-09-12 NOTE — Telephone Encounter (Signed)
How should I reply?

## 2019-09-12 NOTE — Telephone Encounter (Signed)
Patient advised as Dr. Holley Raring suggested.

## 2019-09-12 NOTE — Telephone Encounter (Signed)
Pt called stating that she has been in extreme pain since she had facet blocks done on Wednesday and wants to know if Dr Holley Raring will send in any extra pain medication.

## 2019-09-13 ENCOUNTER — Telehealth (HOSPITAL_COMMUNITY): Payer: Self-pay | Admitting: *Deleted

## 2019-09-13 ENCOUNTER — Encounter (HOSPITAL_COMMUNITY): Payer: Self-pay | Admitting: Psychiatry

## 2019-09-13 NOTE — Telephone Encounter (Signed)
Letter issued as per patient request.

## 2019-09-13 NOTE — Telephone Encounter (Signed)
PATIENT HAS BBEN NOTIFIED LETTER FOR JURY DUTY PER PROVIDER: Letter issued as per patient request

## 2019-09-13 NOTE — Telephone Encounter (Signed)
Patient called requesting a jury duty excuse  for 10/03/19. Due to her bipolar 1. Last seen on 08/29/19

## 2019-09-26 NOTE — Telephone Encounter (Signed)
-----   Message from Arnoldo Lenis, MD sent at 09/26/2019 10:00 AM EST ----- Heart moniotor overall looks good, we did not get as much data as we had planned, did she have troubles with the monitor? No evidence of any worrisome arrhytmias, how are her symptoms doing  Zandra Abts MD

## 2019-09-26 NOTE — Progress Notes (Unsigned)
Called pt. Informed her of monitor results. Pt did only wear it for half the time due to an allergic reaction to the strips. She states that she is having some palpitations at night when she first lays down. She states that a few of them have been painful to where she has had to sit up in the bed and take slow, deep breaths. She did state she has some general anxiety and that may be a factor.

## 2019-09-27 NOTE — Telephone Encounter (Signed)
Yes, I spoke with her about results.

## 2019-09-27 NOTE — Telephone Encounter (Signed)
Did we call her with the result, I don't see a nursing note documented we did  J Casanova Schurman MD
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
## 2019-10-03 ENCOUNTER — Encounter: Payer: Self-pay | Admitting: Student in an Organized Health Care Education/Training Program

## 2019-10-03 ENCOUNTER — Telehealth: Payer: Self-pay

## 2019-10-03 NOTE — Telephone Encounter (Signed)
LM for patient to call office for pre virtual appointment questions.  

## 2019-10-05 ENCOUNTER — Other Ambulatory Visit: Payer: Self-pay

## 2019-10-05 ENCOUNTER — Encounter: Payer: Self-pay | Admitting: Student in an Organized Health Care Education/Training Program

## 2019-10-05 ENCOUNTER — Ambulatory Visit
Payer: Medicare Other | Attending: Student in an Organized Health Care Education/Training Program | Admitting: Student in an Organized Health Care Education/Training Program

## 2019-10-05 DIAGNOSIS — M47816 Spondylosis without myelopathy or radiculopathy, lumbar region: Secondary | ICD-10-CM

## 2019-10-05 DIAGNOSIS — G894 Chronic pain syndrome: Secondary | ICD-10-CM

## 2019-10-05 NOTE — Progress Notes (Signed)
Patient: [name]  Service Category: E/M  Provider: Gillis Santa, MD  DOB: [birth year]  DOS: 10/05/2019  Location: Office  MRN: [id number]  Setting: Ambulatory outpatient  Referring Provider: Cletis [place], MD  Type: Established Patient  Specialty: Interventional Pain Management  PCP: Tammy Athens, MD  Location: Home  Delivery: TeleHealth     Virtual Encounter - Pain Management PROVIDER NOTE: Information contained herein reflects review and annotations entered in association with encounter. Interpretation of such information and data should be left to medically-trained personnel. Information provided to patient can be located elsewhere in the medical record under "Patient Instructions". Document created using STT-dictation technology, any transcriptional errors that may result from process are unintentional.    Contact & Pharmacy Preferred: [phone] Home: [phone] (home) Mobile: [phone] (mobile) E-mail: [email or web address]  [place], [place] [address] Phone: [phone] Fax: [phone]   Pre-screening  [name] offered "in-person" vs "virtual" encounter. She indicated preferring virtual for this encounter.   Reason COVID-19*  Social distancing based on CDC and AMA recommendations.   I contacted [name] on 10/05/2019 via telephone.      I clearly identified myself as Tammy Santa, MD. I verified that I was speaking with the correct person using two identifiers (Name: [name], and date of birth: Feb 09, 1966).  This visit was completed via telephone due to the restrictions of the COVID-19 pandemic. All issues as above were discussed and addressed but no physical exam was performed. If it was felt that the patient should be evaluated in the office, they were directed there. The patient verbally consented to this visit. Patient was unable to complete an audio/visual visit due to Technical  difficulties and/or Lack of internet. Due to the catastrophic nature of the COVID-19 pandemic, this visit was done through audio contact only.  Location of the patient: home address (see Epic for details)  Location of the provider: office  Consent I sought verbal advanced consent from [name] for virtual visit interactions. I informed [name] of possible security and privacy concerns, risks, and limitations associated with providing "not-in-person" medical evaluation and management services. I also informed [name] of the availability of "in-person" appointments. Finally, I informed her that there would be a charge for the virtual visit and that she could be  personally, fully or partially, financially responsible for it. [name] expressed understanding and agreed to proceed.   Historic Elements   [name] is a 54 y.o. year old, female patient evaluated today after her last contact with our practice on 10/03/2019. [name]  has a past medical history of Anxiety, Arthritis, Asthma, Bipolar 1 disorder ([name]), Bipolar disorder ([place]), Cervical cancer ([name]), Chronic back pain, Chronic bronchitis ([name]), COPD (chronic obstructive pulmonary disease) ([name]), Degenerative disc disease, Depression, Dysrhythmia, Fibromyalgia, GERD (gastroesophageal reflux disease), Hepatic cyst, High cholesterol, History of hiatal hernia, Hypertension (08/04/2012), IBS (irritable bowel syndrome) (Diagnosed November 2012), Incisional hernia with gangrene and obstruction (12/26/2015), Melanoma of back ([name]), Ovarian cancer ([name]), Plantar fasciitis, bilateral, and Sleep apnea. She also  has a past surgical history that includes Shoulder surgery (Left, 2010); Abdominal hysterectomy (1997); Laparoscopic abdominal exploration (1997); Cesarean section (1987); Appendectomy; Upper gastrointestinal endoscopy (November 2012); Colonoscopy (November 2012); Melanoma excision (January 2013); Tubal ligation; Carpal tunnel  release (Right); Liver cyst removal (2014); Hernia repair; Laparoscopic incisional / umbilical / ventral hernia repair (12/26/2015); Cholecystectomy open (1998); Cardiac catheterization (2015); Incisional hernia  repair (N/A, 12/26/2015); Insertion of mesh (N/A, 12/26/2015); and Eye surgery. [name] has a current medication list which includes the following prescription(s): aripiprazole, epinephrine, hydroxyzine, losartan-hydrochlorothiazide, tizanidine, amoxicillin-clavulanate, and losartan. She  reports that she quit smoking about 15 months ago. Her smoking use included cigarettes. She smoked 0.00 packs per day for 0.00 years. She uses smokeless tobacco. She reports that she does not drink alcohol or use drugs. [name] is allergic to barium-containing compounds; bee venom; flu virus vaccine; seldane [terfenadine]; sulfa antibiotics; lisinopril; lithium; tetracyclines & related; celebrex [celecoxib]; trazodone and nefazodone; zinc; aspirin; ativan [lorazepam]; erythromycin; and lipitor [atorvastatin].   HPI  Today, she is being contacted for a post-procedure assessment.  Evaluation of last interventional procedure  09/12/2019 Procedure:   Type: Lumbar Facet, Medial Branch Block(s) #2  Primary Purpose: Diagnostic Region: Posterolateral Lumbosacral Spine Level:L3, L4, L5, & S1 Medial Branch Level(s). Injecting these levels blocks the L3-4, L4-5, and L5-S1 lumbar facet joints. Laterality: Bilateral  Sedation: Please see nurses note for DOS. When no sedatives are used, the analgesic levels obtained are directly associated to the effectiveness of the local anesthetics. However, when sedation is provided, the level of analgesia obtained during the initial 1 hour following the intervention, is believed to be the result of a combination of factors. These factors may include, but are not limited to: 1. The effectiveness of the local anesthetics used. 2. The effects of the analgesic(s) and/or anxiolytic(s)  used. 3. The degree of discomfort experienced by the patient at the time of the procedure. 4. The patients ability and reliability in recalling and recording the events. 5. The presence and influence of possible secondary gains and/or psychosocial factors. Reported result: Relief experienced during the 1st hour after the procedure: 100 % (Ultra-Short Term Relief)            Interpretative annotation: Clinically appropriate result. Analgesia during this period is likely to be Local Anesthetic and/or IV Sedative (Analgesic/Anxiolytic) related.          Effects of local anesthetic: The analgesic effects attained during this period are directly associated to the localized infiltration of local anesthetics and therefore cary significant diagnostic value as to the etiological location, or anatomical origin, of the pain. Expected duration of relief is directly dependent on the pharmacodynamics of the local anesthetic used. Long-acting (4-6 hours) anesthetics used.  Reported result: Relief during the next 4 to 6 hour after the procedure: 100 % (Short-Term Relief)            Interpretative annotation: Clinically appropriate result. Analgesia during this period is likely to be Local Anesthetic-related.          Long-term benefit: Defined as the period of time past the expected duration of local anesthetics (1 hour for short-acting and 4-6 hours for long-acting). With the possible exception of prolonged sympathetic blockade from the local anesthetics, benefits during this period are typically attributed to, or associated with, other factors such as analgesic sensory neuropraxia, antiinflammatory effects, or beneficial biochemical changes provided by agents other than the local anesthetics.  Reported result: Extended relief following procedure: 65 %(lasting 6 days) (Long-Term Relief)            Interpretative annotation: Clinically appropriate result. Good relief. No permanent benefit expected. Inflammation plays a  part in the etiology to the pain.          Plan for RFA ablation  Laboratory Chemistry Profile   Renal Lab Results  Component Value Date   BUN 8 12/28/2018  CREATININE 0.8 56/81/2751   BCR NOT APPLICABLE 70/07/7492   GFRAA >60 12/08/2017   GFRNONAA >60 12/08/2017    Hepatic Lab Results  Component Value Date   AST 21 12/28/2018   ALT 18 12/28/2018   ALBUMIN 4.0 12/08/2017   ALKPHOS 85 12/28/2018    Electrolytes Lab Results  Component Value Date   NA 145 12/28/2018   K 4.0 12/28/2018   CL 104 12/08/2017   CALCIUM 9.1 12/08/2017    Bone Lab Results  Component Value Date   VD25OH 18.9 12/28/2018    Inflammation (CRP: Acute Phase) (ESR: Chronic Phase) No results found for: CRP, ESRSEDRATE, LATICACIDVEN    Note: Above Lab results reviewed.  Imaging  Cardiac event monitor  7 day event monitor. Data only available from 51% of planned monitored  time  Min HR 56, Max HR 132, Avg HR 89  Reported symptoms correlated with sinus rhythm  No significant arrhythmias    Assessment  The primary encounter diagnosis was Lumbar facet arthropathy (L4/5 and L5/S1). A diagnosis of Chronic pain syndrome was also pertinent to this visit.  Plan of Care  [name] has a current medication list which includes the following long-term medication(s): aripiprazole and losartan.  Patient is status post 2 sets of diagnostic lumbar facet medial branch nerve blocks bilaterally at L3, L4, L5, S1 on 07/20/2019 and 09/07/2019 both of which provided approximately 60 to 65% pain relief for approximately 6 days.  Given that the patient had 2+ diagnostic lumbar facet medial branch nerve blocks that improved her pain and also improved her functional status, recommend lumbar radiofrequency ablation for the purpose of obtaining longer term pain relief.  Risks and benefits of this procedure were discussed in great detail and patient like to proceed.  We will start with the right side first and then  to the left.  Plan: -Right L3, L4, L5, S1 RFA with sedation followed by left.   Orders:  Orders Placed This Encounter  Procedures  . Radiofrequency,Lumbar    Standing Status:   Future    Standing Expiration Date:   04/06/2021    Scheduling Instructions:     Side(s): Left-sided     Level: L3-4, L4-5, & L5-S1 Facets ( L3, L4, L5, & S1 Medial Branch Nerves)     Sedation: With Sedation     Scheduling Timeframe: 2 weeks after RIGHT    Order Specific Question:   Where will this procedure be performed?    Answer:   [facility]  . Radiofrequency,Lumbar    Standing Status:   Future    Standing Expiration Date:   04/06/2021    Scheduling Instructions:     Side(s): RIGHT     Level: L3-4, L4-5, & L5-S1 Facets (L3, L4, L5, & S1 Medial Branch Nerves)     Sedation: With Sedation     Scheduling Timeframe: As soon as pre-approved    Order Specific Question:   Where will this procedure be performed?    Answer:   [facility]   Follow-up plan:   Return in about 2 weeks (around 10/19/2019) for Right L3, L4, L5, S1 RFA, with sedation.     Status post diagnostic lumbar facet medial branch nerve blocks at L3, L4, L5, S1 on 07/20/2019,  #2 on 09/07/19- effective diagnostic blocks, plan for RFA starting with Ride side first    Recent Visits Date Type Provider Dept  09/07/19 Procedure visit [name], [facility]  08/29/19  Office Visit Tammy Santa, MD [facility]  07/20/19 Procedure visit Tammy Santa, MD [facility]  Showing recent visits within past 90 days and meeting all other requirements   Today's Visits Date Type Provider Dept  10/05/19 Office Visit Tammy Santa, MD [facility]  Showing today's visits and meeting all other requirements   Future Appointments No visits were found meeting these conditions.  Showing future appointments within next 90 days and meeting all other requirements   I discussed the assessment and  treatment plan with the patient. The patient was provided an opportunity to ask questions and all were answered. The patient agreed with the plan and demonstrated an understanding of the instructions.  Patient advised to call back or seek an in-person evaluation if the symptoms or condition worsens.  Duration of encounter: 25 minutes.  Note by: Tammy Santa, MD Date: 10/05/2019; Time: 11:53 AM

## 2019-10-10 ENCOUNTER — Other Ambulatory Visit: Payer: Self-pay

## 2019-10-10 ENCOUNTER — Ambulatory Visit (INDEPENDENT_AMBULATORY_CARE_PROVIDER_SITE_OTHER): Payer: Medicare Other | Admitting: Psychiatry

## 2019-10-10 ENCOUNTER — Encounter (HOSPITAL_COMMUNITY): Payer: Self-pay | Admitting: Psychiatry

## 2019-10-10 DIAGNOSIS — F3130 Bipolar disorder, current episode depressed, mild or moderate severity, unspecified: Secondary | ICD-10-CM | POA: Diagnosis not present

## 2019-10-10 DIAGNOSIS — F4310 Post-traumatic stress disorder, unspecified: Secondary | ICD-10-CM

## 2019-10-10 NOTE — Patient Instructions (Signed)
1. Continue Abilify 5 mg daily 2. Next appointment: 4/19 at 11:30

## 2019-10-11 ENCOUNTER — Ambulatory Visit: Payer: Medicare Other | Admitting: Family Medicine

## 2019-10-19 ENCOUNTER — Ambulatory Visit (HOSPITAL_BASED_OUTPATIENT_CLINIC_OR_DEPARTMENT_OTHER): Payer: Medicare Other | Admitting: Student in an Organized Health Care Education/Training Program

## 2019-10-19 ENCOUNTER — Ambulatory Visit
Admission: RE | Admit: 2019-10-19 | Discharge: 2019-10-19 | Disposition: A | Discharge status: Home / Self Care | Payer: Medicare Other | Source: Ambulatory Visit | Attending: Student in an Organized Health Care Education/Training Program | Admitting: Student in an Organized Health Care Education/Training Program

## 2019-10-19 ENCOUNTER — Ambulatory Visit: Admission: RE | Admit: 2019-10-19 | Payer: Medicare Other | Source: Ambulatory Visit

## 2019-10-19 ENCOUNTER — Other Ambulatory Visit: Payer: Self-pay

## 2019-10-19 ENCOUNTER — Encounter: Payer: Self-pay | Admitting: Student in an Organized Health Care Education/Training Program

## 2019-10-19 VITALS — BP 174/109 | HR 102 | Temp 97.7°F | Resp 12 | Ht 61.0 in | Wt 189.0 lb

## 2019-10-19 DIAGNOSIS — M47816 Spondylosis without myelopathy or radiculopathy, lumbar region: Secondary | ICD-10-CM | POA: Insufficient documentation

## 2019-10-19 MED ORDER — DEXAMETHASONE SODIUM PHOSPHATE 10 MG/ML IJ SOLN
10.0000 mg | Freq: Once | INTRAMUSCULAR | Status: AC
  Administered 2019-10-19: 10 mg

## 2019-10-19 MED ORDER — DEXAMETHASONE SODIUM PHOSPHATE 10 MG/ML IJ SOLN
10.0000 mg | Freq: Once | INTRAMUSCULAR | Status: AC
  Administered 2019-10-19: 10 mg
  Filled 2019-10-19: qty 1

## 2019-10-19 MED ORDER — FENTANYL CITRATE (PF) 100 MCG/2ML IJ SOLN
INTRAMUSCULAR | Status: AC
  Filled 2019-10-19: qty 2

## 2019-10-19 MED ORDER — LIDOCAINE HCL 2 % IJ SOLN
20.0000 mL | Freq: Once | INTRAMUSCULAR | Status: AC
  Administered 2019-10-19: 400 mg

## 2019-10-19 MED ORDER — DEXAMETHASONE SODIUM PHOSPHATE 10 MG/ML IJ SOLN
INTRAMUSCULAR | Status: AC
  Filled 2019-10-19: qty 1

## 2019-10-19 MED ORDER — ROPIVACAINE HCL 2 MG/ML IJ SOLN
9.0000 mL | Freq: Once | INTRAMUSCULAR | Status: AC
  Administered 2019-10-19: 9 mL via PERINEURAL

## 2019-10-19 MED ORDER — ROPIVACAINE HCL 2 MG/ML IJ SOLN
9.0000 mL | Freq: Once | INTRAMUSCULAR | Status: AC
  Administered 2019-10-19: 9 mL via PERINEURAL
  Filled 2019-10-19: qty 10

## 2019-10-19 MED ORDER — ROPIVACAINE HCL 2 MG/ML IJ SOLN
INTRAMUSCULAR | Status: AC
  Filled 2019-10-19: qty 10

## 2019-10-19 MED ORDER — LIDOCAINE HCL 2 % IJ SOLN
INTRAMUSCULAR | Status: AC
  Filled 2019-10-19: qty 20

## 2019-10-19 NOTE — Patient Instructions (Signed)

## 2019-10-19 NOTE — Progress Notes (Signed)
Safety precautions to be maintained throughout the outpatient stay will include: orient to surroundings, keep bed in low position, maintain call bell within reach at all times, provide assistance with transfer out of bed and ambulation.  

## 2019-10-19 NOTE — Progress Notes (Signed)
PROVIDER NOTE: Information contained herein reflects review and annotations entered in association with encounter. Interpretation of such information and data should be left to medically-trained personnel. Information provided to patient can be located elsewhere in the medical record under "Patient Instructions". Document created using STT-dictation technology, any transcriptional errors that may result from process are unintentional.    Patient: [name]  Service Category: Procedure  Provider: Gillis Santa, MD  DOB: [birth year]  DOS: 10/19/2019  Location: [facility]  MRN: [id number]  Setting: Ambulatory - outpatient  Referring Provider: Cletis Athens, MD  Type: Established Patient  Specialty: Interventional Pain Management  PCP: Cletis Athens, MD   Primary Reason for Visit: Interventional Pain Management Treatment. CC: Back Pain (low)  Procedure:          Anesthesia, Analgesia, Anxiolysis:  Type: Thermal Lumbar Facet, Medial Branch Radiofrequency Ablation/Neurotomy           Primary Purpose: Therapeutic Region: Posterolateral Lumbosacral Spine Level: L3, L4, L5, & S1 Medial Branch Level(s). These levels will denervate the L3-4, L4-5, and the L5-S1 lumbar facet joints. Laterality: Right  Type: Local Anesthesia  Local Anesthetic: Lidocaine 1-2%  Position: Prone   Indications: 1. Lumbar facet arthropathy (L4/5 and L5/S1)    [name] has been dealing with the above chronic pain for longer than three months and has either failed to respond, was unable to tolerate, or simply did not get enough benefit from other more conservative therapies including, but not limited to: 1. Over-the-counter medications 2. Anti-inflammatory medications 3. Muscle relaxants 4. Membrane stabilizers 5. Opioids 6. Physical therapy and/or chiropractic manipulation 7. Modalities (Heat, ice, etc.) 8. Invasive techniques such as nerve blocks. [name] has attained more than 50% relief of  the pain from a series of diagnostic injections conducted in separate occasions.  Pain Score: Pre-procedure: 6 /10 Post-procedure: 0-No pain/10  Pre-op Assessment:  [name] is a 54 y.o. (year old), female patient, seen today for interventional treatment. She  has a past surgical history that includes Shoulder surgery (Left, 2010); Abdominal hysterectomy (1997); Laparoscopic abdominal exploration (1997); Cesarean section (1987); Appendectomy; Upper gastrointestinal endoscopy (November 2012); Colonoscopy (November 2012); Melanoma excision (January 2013); Tubal ligation; Carpal tunnel release (Right); Liver cyst removal (2014); Hernia repair; Laparoscopic incisional / umbilical / ventral hernia repair (12/26/2015); Cholecystectomy open (1998); Cardiac catheterization (2015); Incisional hernia repair (N/A, 12/26/2015); Insertion of mesh (N/A, 12/26/2015); and Eye surgery. [name] has a current medication list which includes the following prescription(s): aripiprazole, epinephrine, losartan-hydrochlorothiazide, tizanidine, amoxicillin-clavulanate, and losartan. Her primarily concern today is the Back Pain (low)  Initial Vital Signs:  Pulse/HCG Rate: (!) 102ECG Heart Rate: 88 Temp: 97.7 F (36.5 C) Resp: 18 BP: (!) 184/103 SpO2: 98 %  BMI: Estimated body mass index is 35.71 kg/m as calculated from the following:   Height as of this encounter: 5\' 1"  (1.549 m).   Weight as of this encounter: 189 lb (85.7 kg).  Risk Assessment: Allergies: Reviewed. She is allergic to barium-containing compounds; bee venom; flu virus vaccine; seldane [terfenadine]; sulfa antibiotics; lisinopril; lithium; tetracyclines & related; celebrex [celecoxib]; trazodone and nefazodone; zinc; aspirin; ativan [lorazepam]; erythromycin; and lipitor [atorvastatin].  Allergy Precautions: None required Coagulopathies: Reviewed. None identified.  Blood-thinner therapy: None at this time Active Infection(s): Reviewed. None  identified. [name] is afebrile  Site Confirmation: [name] was asked to confirm the procedure and laterality before marking the site Procedure checklist: Completed Consent: Before the procedure and under the influence of no sedative(s), amnesic(s),  or anxiolytics, the patient was informed of the treatment options, risks and possible complications. To fulfill our ethical and legal obligations, as recommended by the American Medical Association's Code of Ethics, I have informed the patient of my clinical impression; the nature and purpose of the treatment or procedure; the risks, benefits, and possible complications of the intervention; the alternatives, including doing nothing; the risk(s) and benefit(s) of the alternative treatment(s) or procedure(s); and the risk(s) and benefit(s) of doing nothing. The patient was provided information about the general risks and possible complications associated with the procedure. These may include, but are not limited to: failure to achieve desired goals, infection, bleeding, organ or nerve damage, allergic reactions, paralysis, and death. In addition, the patient was informed of those risks and complications associated to Spine-related procedures, such as failure to decrease pain; infection (i.e.: Meningitis, epidural or intraspinal abscess); bleeding (i.e.: epidural hematoma, subarachnoid hemorrhage, or any other type of intraspinal or peri-dural bleeding); organ or nerve damage (i.e.: Any type of peripheral nerve, nerve root, or spinal cord injury) with subsequent damage to sensory, motor, and/or autonomic systems, resulting in permanent pain, numbness, and/or weakness of one or several areas of the body; allergic reactions; (i.e.: anaphylactic reaction); and/or death. Furthermore, the patient was informed of those risks and complications associated with the medications. These include, but are not limited to: allergic reactions (i.e.: anaphylactic or  anaphylactoid reaction(s)); adrenal axis suppression; blood sugar elevation that in diabetics may result in ketoacidosis or comma; water retention that in patients with history of congestive heart failure may result in shortness of breath, pulmonary edema, and decompensation with resultant heart failure; weight gain; swelling or edema; medication-induced neural toxicity; particulate matter embolism and blood vessel occlusion with resultant organ, and/or nervous system infarction; and/or aseptic necrosis of one or more joints. Finally, the patient was informed that Medicine is not an exact science; therefore, there is also the possibility of unforeseen or unpredictable risks and/or possible complications that may result in a catastrophic outcome. The patient indicated having understood very clearly. We have given the patient no guarantees and we have made no promises. Enough time was given to the patient to ask questions, all of which were answered to the patient's satisfaction. Ms. Rabelo has indicated that she wanted to continue with the procedure. Attestation: I, the ordering provider, attest that I have discussed with the patient the benefits, risks, side-effects, alternatives, likelihood of achieving goals, and potential problems during recovery for the procedure that I have provided informed consent. Date  Time: 10/19/2019  7:57 AM  Pre-Procedure Preparation:  Monitoring: As per clinic protocol. Respiration, ETCO2, SpO2, BP, heart rate and rhythm monitor placed and checked for adequate function Safety Precautions: Patient was assessed for positional comfort and pressure points before starting the procedure. Time-out: I initiated and conducted the "Time-out" before starting the procedure, as per protocol. The patient was asked to participate by confirming the accuracy of the "Time Out" information. Verification of the correct person, site, and procedure were performed and confirmed by me, the nursing  staff, and the patient. "Time-out" conducted as per Joint Commission's Universal Protocol (UP.01.01.01). Time: 0831  Description of Procedure:          Laterality: Right Levels:  L3, L4, L5, & S1 Medial Branch Level(s), at the L3-4, L4-5, and the L5-S1 lumbar facet joints. Area Prepped: Lumbosacral Prepping solution: DuraPrep (Iodine Povacrylex [0.7% available iodine] and Isopropyl Alcohol, 74% w/w) Safety Precautions: Aspiration looking for blood return was conducted prior to all  injections. At no point did we inject any substances, as a needle was being advanced. Before injecting, the patient was told to immediately notify me if she was experiencing any new onset of "ringing in the ears, or metallic taste in the mouth". No attempts were made at seeking any paresthesias. Safe injection practices and needle disposal techniques used. Medications properly checked for expiration dates. SDV (single dose vial) medications used. After the completion of the procedure, all disposable equipment used was discarded in the proper designated medical waste containers. Local Anesthesia: Protocol guidelines were followed. The patient was positioned over the fluoroscopy table. The area was prepped in the usual manner. The time-out was completed. The target area was identified using fluoroscopy. A 12-in long, straight, sterile hemostat was used with fluoroscopic guidance to locate the targets for each level blocked. Once located, the skin was marked with an approved surgical skin marker. Once all sites were marked, the skin (epidermis, dermis, and hypodermis), as well as deeper tissues (fat, connective tissue and muscle) were infiltrated with a small amount of a short-acting local anesthetic, loaded on a 10cc syringe with a 25G, 1.5-in  Needle. An appropriate amount of time was allowed for local anesthetics to take effect before proceeding to the next step. Local Anesthetic: Lidocaine 2.0% The unused portion of the local  anesthetic was discarded in the proper designated containers. Technical explanation of process:  Radiofrequency Ablation (RFA)  L3 Medial Branch Nerve RFA: The target area for the L3 medial branch is at the junction of the postero-lateral aspect of the superior articular process and the superior, posterior, and medial edge of the transverse process of L4. Under fluoroscopic guidance, a Radiofrequency needle was inserted until contact was made with os over the superior postero-lateral aspect of the pedicular shadow (target area). Sensory and motor testing was conducted to properly adjust the position of the needle. Once satisfactory placement of the needle was achieved, the numbing solution was slowly injected after negative aspiration for blood. 2.0 mL of the nerve block solution was injected without difficulty or complication. After waiting for at least 3 minutes, the ablation was performed. Once completed, the needle was removed intact. L4 Medial Branch Nerve RFA: The target area for the L4 medial branch is at the junction of the postero-lateral aspect of the superior articular process and the superior, posterior, and medial edge of the transverse process of L5. Under fluoroscopic guidance, a Radiofrequency needle was inserted until contact was made with os over the superior postero-lateral aspect of the pedicular shadow (target area). Sensory and motor testing was conducted to properly adjust the position of the needle. Once satisfactory placement of the needle was achieved, the numbing solution was slowly injected after negative aspiration for blood. 2.0 mL of the nerve block solution was injected without difficulty or complication. After waiting for at least 3 minutes, the ablation was performed. Once completed, the needle was removed intact. L5 Medial Branch Nerve RFA: The target area for the L5 medial branch is at the junction of the postero-lateral aspect of the superior articular process of S1 and the  superior, posterior, and medial edge of the sacral ala. Under fluoroscopic guidance, a Radiofrequency needle was inserted until contact was made with os over the superior postero-lateral aspect of the pedicular shadow (target area). Sensory and motor testing was conducted to properly adjust the position of the needle. Once satisfactory placement of the needle was achieved, the numbing solution was slowly injected after negative aspiration for blood. 2.0 mL  of the nerve block solution was injected without difficulty or complication. After waiting for at least 3 minutes, the ablation was performed. Once completed, the needle was removed intact. S1 Medial Branch Nerve RFA: The target area for the S1 medial branch is located inferior to the junction of the S1 superior articular process and the L5 inferior articular process, posterior, inferior, and lateral to the 6 o'clock position of the L5-S1 facet joint, just superior to the S1 posterior foramen. Under fluoroscopic guidance, the Radiofrequency needle was advanced until contact was made with os over the Target area. Sensory and motor testing was conducted to properly adjust the position of the needle. Once satisfactory placement of the needle was achieved, the numbing solution was slowly injected after negative aspiration for blood. 2.0 mL of the nerve block solution was injected without difficulty or complication. After waiting for at least 3 minutes, the ablation was performed. Once completed, the needle was removed intact.  Radiofrequency lesioning (ablation):  Radiofrequency Generator: NeuroTherm [id number] Sensory Stimulation Parameters: 50 Hz was used to locate & identify the nerve, making sure that the needle was positioned such that there was no sensory stimulation below 0.3 V or above 0.7 V. Motor Stimulation Parameters: 2 Hz was used to evaluate the motor component. Care was taken not to lesion any nerves that demonstrated motor stimulation of the lower  extremities at an output of less than 2.5 times that of the sensory threshold, or a maximum of 2.0 V. Lesioning Technique Parameters: Standard Radiofrequency settings. (Not bipolar or pulsed.) Temperature Settings: 80 degrees C Lesioning time: 60 seconds Intra-operative Compliance: Compliant Materials & Medications: Needle(s) (Electrode/Cannula) Type: Teflon-coated, curved tip, Radiofrequency needle(s) Gauge: 22G Length: 10cm Numbing solution: 10 cc solution made of 8 cc of 0.2% ropivacaine, 2 cc of Decadron 10 mg/cc.  2 cc injected at each level above on the right after sensorimotor testing, prior to lesioning.  The unused portion of the solution was discarded in the proper designated containers.  Once the entire procedure was completed, the treated area was cleaned, making sure to leave some of the prepping solution back to take advantage of its long term bactericidal properties.  Illustration of the posterior view of the lumbar spine and the posterior neural structures. Laminae of L2 through S1 are labeled. DPRL5, dorsal primary ramus of L5; DPRS1, dorsal primary ramus of S1; DPR3, dorsal primary ramus of L3; FJ, facet (zygapophyseal) joint L3-L4; I, inferior articular process of L4; LB1, lateral branch of dorsal primary ramus of L1; IAB, inferior articular branches from L3 medial branch (supplies L4-L5 facet joint); IBP, intermediate branch plexus; MB3, medial branch of dorsal primary ramus of L3; NR3, third lumbar nerve root; S, superior articular process of L5; SAB, superior articular branches from L4 (supplies L4-5 facet joint also); TP3, transverse process of L3.  Vitals:   10/19/19 0843 10/19/19 0847 10/19/19 0853 10/19/19 0855  BP: (!) 192/94 (!) 172/111 (!) 183/100 (!) 174/109  Pulse:      Resp: 10 11 (!) 9 12  Temp:      TempSrc:      SpO2: 96% 96% 95% 96%  Weight:      Height:       Start Time: 0831 hrs. End Time: 0855 hrs.  Imaging Guidance (Spinal):          Type of  Imaging Technique: Fluoroscopy Guidance (Spinal) Indication(s): Assistance in needle guidance and placement for procedures requiring needle placement in or near specific anatomical locations not easily accessible  without such assistance. Exposure Time: Please see nurses notes. Contrast: None used. Fluoroscopic Guidance: I was personally present during the use of fluoroscopy. "Tunnel Vision Technique" used to obtain the best possible view of the target area. Parallax error corrected before commencing the procedure. "Direction-depth-direction" technique used to introduce the needle under continuous pulsed fluoroscopy. Once target was reached, antero-posterior, oblique, and lateral fluoroscopic projection used confirm needle placement in all planes. Images permanently stored in EMR. Interpretation: No contrast injected. I personally interpreted the imaging intraoperatively. Adequate needle placement confirmed in multiple planes. Permanent images saved into the patient's record.  Antibiotic Prophylaxis:   Anti-infectives (From admission, onward)   None     Indication(s): None identified  Post-operative Assessment:  Post-procedure Vital Signs:  Pulse/HCG Rate: (!) 10267 Temp: 97.7 F (36.5 C) Resp: 12 BP: (!) 174/109 SpO2: 96 %  EBL: None  Complications: No immediate post-treatment complications observed by team, or reported by patient.  Note: The patient tolerated the entire procedure well. A repeat set of vitals were taken after the procedure and the patient was kept under observation following institutional policy, for this type of procedure. Post-procedural neurological assessment was performed, showing return to baseline, prior to discharge. The patient was provided with post-procedure discharge instructions, including a section on how to identify potential problems. Should any problems arise concerning this procedure, the patient was given instructions to immediately contact us, at any  time, without hesitation. In any case, we plan to contact the patient by telephone for a follow-up status report regarding this interventional procedure.  Comments:  No additional relevant information.  Plan of Care  Orders:  Orders Placed This Encounter  Procedures  . DG PAIN [facility]IN[facility] C-ARM 1-60 MIN NO REPORT    Intraoperative interpretation by procedural physician at [employer].    Standing Status:   Standing    Number of Occurrences:   1    Order Specific Question:   Reason for exam:    Answer:   Assistance in needle guidance and placement for procedures requiring needle placement in or near specific anatomical locations not easily accessible without such assistance.    Medications ordered for procedure: Meds ordered this encounter  Medications  . lidocaine (XYLOCAINE) 2 % (with pres) injection 400 mg  . ropivacaine (PF) 2 mg/mL (0.2%) (NAROPIN) injection 9 mL  . dexamethasone (DECADRON) injection 10 mg  . dexamethasone (DECADRON) injection 10 mg  . ropivacaine (PF) 2 mg/mL (0.2%) (NAROPIN) injection 9 mL   Medications administered: We administered lidocaine, ropivacaine (PF) 2 mg/mL (0.2%), dexamethasone, dexamethasone, and ropivacaine (PF) 2 mg/mL (0.2%).  See the medical record for exact dosing, route, and time of administration.  Return for contralateral RFA on 11/02/2019  Follow-up plan:   Return for [facility] appt.      Status post diagnostic lumbar facet medial branch nerve blocks at L3, L4, L5, S1 on 07/20/2019,  #2 on 09/07/19- effective diagnostic blocks, right L3, L4, L5, S1 RFA on 10/19/2019     Recent Visits Date Type Provider Dept  10/05/19 Office Visit Gillis Santa, MD [facility]  09/07/19 Procedure visit Gillis Santa, MD [facility]  08/29/19 Office Visit Gillis Santa, MD [facility]  Showing recent visits within past 90 days and meeting all other requirements   Today's Visits Date Type Provider Dept  10/19/19  Procedure visit Gillis Santa, MD [facility]  Showing today's visits and meeting all other requirements   Future Appointments Date Type Provider Dept  11/02/19 Appointment Gillis Santa, MD [facility]  Showing future appointments within next 90 days and meeting all other requirements   Disposition: Discharge home  Discharge (Date  Time): 10/19/2019; 0905 hrs.   Primary Care Physician: Cletis Athens, MD Location: [facility] Note by: Gillis Santa, MD Date: 10/19/2019; Time: 10:34 AM  Disclaimer:  Medicine is not an exact science. The only guarantee in medicine is that nothing is guaranteed. It is important to note that the decision to proceed with this intervention was based on the information collected from the patient. The Data and conclusions were drawn from the patient's questionnaire, the interview, and the physical examination. Because the information was provided in large part by the patient, it cannot be guaranteed that it has not been purposely or unconsciously manipulated. Every effort has been made to obtain as much relevant data as possible for this evaluation. It is important to note that the conclusions that lead to this procedure are derived in large part from the available data. Always take into account that the treatment will also be dependent on availability of resources and existing treatment guidelines, considered by other Pain Management Practitioners as being common knowledge and practice, at the time of the intervention. For Medico-Legal purposes, it is also important to point out that variation in procedural techniques and pharmacological choices are the acceptable norm. The indications, contraindications, technique, and results of the above procedure should only be interpreted and judged by a Board-Certified Interventional Pain Specialist with extensive familiarity and expertise in the same exact procedure and technique.

## 2019-10-20 ENCOUNTER — Telehealth: Payer: Self-pay | Admitting: *Deleted

## 2019-10-20 NOTE — Telephone Encounter (Signed)
Spoke with patient today re; procedure on yesterday, no problems or concerns.

## 2019-10-25 ENCOUNTER — Telehealth: Payer: Self-pay | Admitting: Student in an Organized Health Care Education/Training Program

## 2019-10-25 MED ORDER — METHOCARBAMOL 750 MG PO TABS: 750.0000 mg | ORAL_TABLET | Freq: Three times a day (TID) | ORAL | 0 refills | Status: DC | PRN

## 2019-10-25 NOTE — Telephone Encounter (Signed)
Dr. Holley Raring,                  Please advise.

## 2019-10-25 NOTE — Telephone Encounter (Signed)
Pt lvmail stating Tizanidine is not helping her pain. She is scheduled for RFA 11-02-19.

## 2019-10-25 NOTE — Telephone Encounter (Signed)
DC Tizanidine, trial of Robaxin as below.

## 2019-10-26 ENCOUNTER — Telehealth: Payer: Self-pay | Admitting: Student in an Organized Health Care Education/Training Program

## 2019-10-26 NOTE — Telephone Encounter (Signed)
[employer] lvmail at 12:49 stating they need prior auth submitted for this patient's Robaxin Call [phone] to submit PA

## 2019-10-26 NOTE — Telephone Encounter (Signed)
Patient called and instructed to discontinue Zanaflex and start Robaxin as directed. Verbalized understanding.

## 2019-10-26 NOTE — Telephone Encounter (Signed)
Patient called and informed that she needs to call her insurance co and find out which muscle relaxer her insurance does cover and call us back.

## 2019-10-26 NOTE — Telephone Encounter (Signed)
Patient  States ins will not cover her Robaxin that Dr. Holley Raring sent in. Is there a different med he can change to

## 2019-11-02 ENCOUNTER — Ambulatory Visit: Payer: Medicare Other | Admitting: Student in an Organized Health Care Education/Training Program
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
## 2019-11-14 ENCOUNTER — Ambulatory Visit (INDEPENDENT_AMBULATORY_CARE_PROVIDER_SITE_OTHER): Payer: Medicare Other | Admitting: Psychiatry

## 2019-11-14 ENCOUNTER — Other Ambulatory Visit: Payer: Self-pay

## 2019-11-14 ENCOUNTER — Encounter (HOSPITAL_COMMUNITY): Payer: Self-pay | Admitting: Psychiatry

## 2019-11-14 DIAGNOSIS — F4310 Post-traumatic stress disorder, unspecified: Secondary | ICD-10-CM

## 2019-11-14 DIAGNOSIS — F3130 Bipolar disorder, current episode depressed, mild or moderate severity, unspecified: Secondary | ICD-10-CM | POA: Diagnosis not present

## 2019-11-14 MED ORDER — ARIPIPRAZOLE 5 MG PO TABS: 5.0000 mg | ORAL_TABLET | Freq: Every day | ORAL | 0 refills | Status: DC

## 2019-11-14 NOTE — Patient Instructions (Signed)
1.Continue Abilify 5 mg daily 2. Next appointment: 6/8 at 11 AM

## 2019-12-01 DIAGNOSIS — N904 Leukoplakia of vulva: Secondary | ICD-10-CM | POA: Insufficient documentation

## 2019-12-02 DIAGNOSIS — J418 Mixed simple and mucopurulent chronic bronchitis: Secondary | ICD-10-CM | POA: Insufficient documentation

## 2019-12-06 ENCOUNTER — Ambulatory Visit (HOSPITAL_COMMUNITY): Payer: Medicare Other | Admitting: Psychiatry

## 2019-12-06 ENCOUNTER — Telehealth (HOSPITAL_COMMUNITY): Payer: Self-pay | Admitting: Psychiatry

## 2019-12-06 ENCOUNTER — Other Ambulatory Visit: Payer: Self-pay

## 2019-12-06 NOTE — Telephone Encounter (Signed)
Therapist contacted patient via text through care agility platform for scheduled appointment.  However, patient reported she was on her way to take her husband to an emergency medical appointment.  Therapist and patient agreed to cancel appointment.  Patient will call and reschedule.

## 2019-12-25 ENCOUNTER — Other Ambulatory Visit: Payer: Self-pay

## 2019-12-25 ENCOUNTER — Ambulatory Visit
Admission: EM | Admit: 2019-12-25 | Discharge: 2019-12-25 | Disposition: A | Discharge status: Home / Self Care | Payer: Medicare Other | Attending: Emergency Medicine | Admitting: Emergency Medicine

## 2019-12-25 ENCOUNTER — Encounter: Payer: Self-pay | Admitting: Emergency Medicine

## 2019-12-25 DIAGNOSIS — H5711 Ocular pain, right eye: Secondary | ICD-10-CM

## 2019-12-25 DIAGNOSIS — S0501XA Injury of conjunctiva and corneal abrasion without foreign body, right eye, initial encounter: Secondary | ICD-10-CM | POA: Diagnosis not present

## 2019-12-25 MED ORDER — OFLOXACIN 0.3 % OP SOLN: OPHTHALMIC | 0 refills | Status: DC

## 2019-12-25 MED ORDER — HYPROMELLOSE 0.3 % OP GEL: OPHTHALMIC | 0 refills | Status: DC | PRN
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
## 2019-12-25 NOTE — Discharge Instructions (Signed)
Use ofloxacin as prescribed and to completion Use genteal gel eye drops at night as needed for symptomatic relief Use OTC ibuprofen or tylenol as needed for pain relief Return here, follow up with ophthalmology, or go to the ED if symptoms persists or worsen such as fever, chills, redness, swelling, eye pain, painful eye movements, vision changes, etc..Marland Kitchen

## 2019-12-25 NOTE — ED Triage Notes (Signed)
A piece of plastic netting hit her in the LT eye last night.  Pt is having redness, burning, stinging, watering and blurry vision in that eye.
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
## 2020-01-03 ENCOUNTER — Encounter (HOSPITAL_COMMUNITY): Payer: Self-pay | Admitting: Psychiatry

## 2020-01-03 ENCOUNTER — Telehealth (HOSPITAL_COMMUNITY): Payer: Self-pay | Admitting: *Deleted

## 2020-01-03 ENCOUNTER — Other Ambulatory Visit: Payer: Self-pay

## 2020-01-03 ENCOUNTER — Telehealth (INDEPENDENT_AMBULATORY_CARE_PROVIDER_SITE_OTHER): Payer: Medicare Other | Admitting: Psychiatry

## 2020-01-03 DIAGNOSIS — F3130 Bipolar disorder, current episode depressed, mild or moderate severity, unspecified: Secondary | ICD-10-CM | POA: Diagnosis not present

## 2020-01-03 DIAGNOSIS — F4310 Post-traumatic stress disorder, unspecified: Secondary | ICD-10-CM

## 2020-01-03 DIAGNOSIS — G4700 Insomnia, unspecified: Secondary | ICD-10-CM

## 2020-01-03 MED ORDER — ESZOPICLONE 1 MG PO TABS: 1.0000 mg | ORAL_TABLET | Freq: Every evening | ORAL | 1 refills | Status: DC | PRN

## 2020-01-03 MED ORDER — ZOLPIDEM TARTRATE 5 MG PO TABS: 5.0000 mg | ORAL_TABLET | Freq: Every evening | ORAL | 1 refills | Status: DC | PRN

## 2020-01-03 NOTE — Telephone Encounter (Signed)
LMOM for patient to call office back to schedule appt with Peggy per Dr. Modesta Messing. Office number was provided on voicemail.

## 2020-01-03 NOTE — Telephone Encounter (Signed)
Called pharmacy to cancel patient Ambien that was accidentally sent to them by provider and spoke with Erlene Quan and he verbalized understanding.

## 2020-01-08 ENCOUNTER — Ambulatory Visit
Admission: EM | Admit: 2020-01-08 | Discharge: 2020-01-08 | Disposition: A | Discharge status: Home / Self Care | Payer: Medicare Other | Attending: Emergency Medicine | Admitting: Emergency Medicine

## 2020-01-08 ENCOUNTER — Other Ambulatory Visit: Payer: Self-pay

## 2020-01-08 DIAGNOSIS — H9202 Otalgia, left ear: Secondary | ICD-10-CM | POA: Diagnosis not present

## 2020-01-08 DIAGNOSIS — H6002 Abscess of left external ear: Secondary | ICD-10-CM

## 2020-01-08 MED ORDER — NEOMYCIN-POLYMYXIN-HC 3.5-10000-1 OT SUSP: 4.0000 [drp] | Freq: Three times a day (TID) | OTIC | 0 refills | Status: AC

## 2020-01-08 NOTE — ED Triage Notes (Signed)
Pt presents with c/o left ear pain and some bleeding after inserting q tip
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
## 2020-01-08 NOTE — Discharge Instructions (Signed)
Rest and drink plenty of fluids Cortisporin ear drops prescribed for infection prevention Take medications as directed and to completion Continue to use OTC ibuprofen and/ or tylenol as needed for pain control Follow up with PCP if symptoms persists Return here or go to the ER if you have any new or worsening symptoms fever, chills, nausea, vomiting, ear pain, redness, swelling, worsening symptoms despite treatment, etc..Marland Kitchen

## 2020-01-12 DIAGNOSIS — M659 Synovitis and tenosynovitis, unspecified: Secondary | ICD-10-CM | POA: Insufficient documentation

## 2020-01-13 ENCOUNTER — Other Ambulatory Visit: Payer: Self-pay

## 2020-01-13 ENCOUNTER — Ambulatory Visit (INDEPENDENT_AMBULATORY_CARE_PROVIDER_SITE_OTHER): Payer: Medicare Other | Admitting: Psychiatry

## 2020-01-13 ENCOUNTER — Encounter (HOSPITAL_COMMUNITY): Payer: Self-pay | Admitting: Psychiatry

## 2020-01-13 DIAGNOSIS — F3130 Bipolar disorder, current episode depressed, mild or moderate severity, unspecified: Secondary | ICD-10-CM
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
## 2020-01-31 ENCOUNTER — Other Ambulatory Visit (HOSPITAL_COMMUNITY): Payer: Self-pay | Admitting: Internal Medicine

## 2020-01-31 DIAGNOSIS — Z1231 Encounter for screening mammogram for malignant neoplasm of breast: Secondary | ICD-10-CM

## 2020-02-01 ENCOUNTER — Telehealth (HOSPITAL_COMMUNITY): Payer: Self-pay | Admitting: *Deleted

## 2020-02-01 NOTE — Telephone Encounter (Signed)
LMOM to call office back to sch 3 appts 2 wks apart.

## 2020-02-12 ENCOUNTER — Encounter: Payer: Self-pay | Admitting: Emergency Medicine

## 2020-02-12 ENCOUNTER — Ambulatory Visit
Admission: EM | Admit: 2020-02-12 | Discharge: 2020-02-12 | Disposition: A | Discharge status: Home / Self Care | Payer: Medicare Other | Attending: Emergency Medicine | Admitting: Emergency Medicine

## 2020-02-12 ENCOUNTER — Other Ambulatory Visit: Payer: Self-pay

## 2020-02-12 DIAGNOSIS — B029 Zoster without complications: Secondary | ICD-10-CM | POA: Diagnosis not present

## 2020-02-12 MED ORDER — VALACYCLOVIR HCL 1 G PO TABS: 1000.0000 mg | ORAL_TABLET | Freq: Three times a day (TID) | ORAL | 0 refills | Status: DC
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
## 2020-02-12 NOTE — ED Triage Notes (Signed)
Patient states that she has a rash on her RT breast, pain, poss shingles

## 2020-02-12 NOTE — Discharge Instructions (Signed)
Rest and use ice/heat as needed for symptomatic relief Prescribed valacyclovir 1000mg  3x/day for 7 days Use OTC medications such as ibuprofen/ tylenol.  Use tramadol as needed for break-through pain Follow up with PCP in 7-10 days if rash is still present Follow up with PCP if symptoms of burning, stinging, tingling or numbness occur after rash resolves, you may need additional treatment Return here or go to ER if you have any new or worsening symptoms (such as eye involvement, severe pain, or signs of secondary infection such as fever, chills, nausea, vomiting, discharge, redness or warmth over site of rash)

## 2020-02-13 ENCOUNTER — Telehealth (INDEPENDENT_AMBULATORY_CARE_PROVIDER_SITE_OTHER): Payer: Medicare Other | Admitting: Psychiatry

## 2020-02-13 ENCOUNTER — Encounter (HOSPITAL_COMMUNITY): Payer: Self-pay | Admitting: Psychiatry

## 2020-02-13 ENCOUNTER — Other Ambulatory Visit: Payer: Self-pay

## 2020-02-13 DIAGNOSIS — F4310 Post-traumatic stress disorder, unspecified: Secondary | ICD-10-CM

## 2020-02-13 DIAGNOSIS — F3130 Bipolar disorder, current episode depressed, mild or moderate severity, unspecified: Secondary | ICD-10-CM

## 2020-02-13 DIAGNOSIS — G4700 Insomnia, unspecified: Secondary | ICD-10-CM | POA: Diagnosis not present

## 2020-02-13 MED ORDER — ARIPIPRAZOLE 5 MG PO TABS: 5.0000 mg | ORAL_TABLET | Freq: Every day | ORAL | 0 refills | Status: DC

## 2020-02-13 MED ORDER — ESZOPICLONE 1 MG PO TABS: 1.0000 mg | ORAL_TABLET | Freq: Every evening | ORAL | 2 refills | Status: DC | PRN

## 2020-02-13 NOTE — Patient Instructions (Signed)
1.Continue Abilify5mg  daily 2. Continue Lunesta 1 mg at night as needed for insomnia  3. Next appointment: 10/11 at 10:40
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
## 2020-02-14 ENCOUNTER — Other Ambulatory Visit: Payer: Self-pay

## 2020-02-14 ENCOUNTER — Ambulatory Visit (INDEPENDENT_AMBULATORY_CARE_PROVIDER_SITE_OTHER): Payer: Medicare Other | Admitting: Psychiatry

## 2020-02-14 DIAGNOSIS — F3130 Bipolar disorder, current episode depressed, mild or moderate severity, unspecified: Secondary | ICD-10-CM | POA: Diagnosis not present
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
## 2020-02-16 ENCOUNTER — Ambulatory Visit (HOSPITAL_COMMUNITY): Payer: Medicare Other

## 2020-02-28 ENCOUNTER — Ambulatory Visit (HOSPITAL_COMMUNITY): Payer: Medicare Other | Admitting: Psychiatry

## 2020-03-13 ENCOUNTER — Ambulatory Visit (HOSPITAL_COMMUNITY): Payer: Medicare Other | Admitting: Psychiatry

## 2020-03-27 ENCOUNTER — Ambulatory Visit (HOSPITAL_COMMUNITY): Payer: Medicare Other | Admitting: Psychiatry

## 2020-04-26 ENCOUNTER — Encounter (HOSPITAL_COMMUNITY): Payer: Self-pay | Admitting: Physical Therapy

## 2020-04-26 NOTE — Therapy (Signed)
[place], [place], [address] Phone: [phone]   Fax:  [phone]  Patient Details  Name: [name] MRN: [id number] Date of Birth: Dec 31, 1965 Referring Provider:  Gillis Santa MD  Encounter Date: 04/26/2020  PHYSICAL THERAPY DISCHARGE SUMMARY  Visits from Start of Care: 1 Current functional level related to goals / functional outcomes: Unable to assess, patient did not return after eval    Remaining deficits: Unable to assess, patient did not return after eval    Education / Equipment: DC per non return for follow up  Plan: Patient agrees to discharge.  Patient goals were not met. Patient is being discharged due to not returning since the last visit.  ?????     5:50 PM, 04/26/20 Josue Hector PT DPT  Physical Therapist with [facility]  [phone] [place], [place], [address] Phone: [phone]   Fax:  [phone]
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
## 2020-05-07 ENCOUNTER — Telehealth (INDEPENDENT_AMBULATORY_CARE_PROVIDER_SITE_OTHER): Payer: Medicare Other | Admitting: Psychiatry

## 2020-05-07 ENCOUNTER — Other Ambulatory Visit: Payer: Self-pay

## 2020-05-07 ENCOUNTER — Encounter (HOSPITAL_COMMUNITY): Payer: Self-pay | Admitting: Psychiatry

## 2020-05-07 DIAGNOSIS — G4700 Insomnia, unspecified: Secondary | ICD-10-CM

## 2020-05-07 DIAGNOSIS — F3130 Bipolar disorder, current episode depressed, mild or moderate severity, unspecified: Secondary | ICD-10-CM | POA: Diagnosis not present

## 2020-05-07 DIAGNOSIS — F4310 Post-traumatic stress disorder, unspecified: Secondary | ICD-10-CM | POA: Diagnosis not present

## 2020-05-07 MED ORDER — ARIPIPRAZOLE 5 MG PO TABS: 7.5000 mg | ORAL_TABLET | Freq: Every day | ORAL | 0 refills | Status: DC

## 2020-05-07 MED ORDER — ESZOPICLONE 2 MG PO TABS: 2.0000 mg | ORAL_TABLET | Freq: Every evening | ORAL | 1 refills | Status: DC | PRN

## 2020-05-07 NOTE — Patient Instructions (Signed)
1.Increase Abilify7.5mg  daily/worsening in irritability at higher dose 2. Continue Lunesta 2 mg at night as needed for insomnia  3. Next appointment: 11/22 at 11:30

## 2020-05-08 ENCOUNTER — Other Ambulatory Visit: Payer: Self-pay

## 2020-05-08 ENCOUNTER — Ambulatory Visit
Admission: EM | Admit: 2020-05-08 | Discharge: 2020-05-08 | Disposition: A | Discharge status: Home / Self Care | Payer: Medicare Other | Attending: Emergency Medicine | Admitting: Emergency Medicine

## 2020-05-08 NOTE — ED Triage Notes (Signed)
Pt had 2 stiches in back from 2 weeks ago , skin approximated well, stiches removed
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
## 2020-06-18 ENCOUNTER — Encounter: Payer: Self-pay | Admitting: Psychiatry

## 2020-06-18 ENCOUNTER — Other Ambulatory Visit: Payer: Self-pay

## 2020-06-18 ENCOUNTER — Telehealth (INDEPENDENT_AMBULATORY_CARE_PROVIDER_SITE_OTHER): Payer: Medicare Other | Admitting: Psychiatry

## 2020-06-18 ENCOUNTER — Telehealth (HOSPITAL_COMMUNITY): Payer: Medicare Other | Admitting: Psychiatry

## 2020-06-18 DIAGNOSIS — F4310 Post-traumatic stress disorder, unspecified: Secondary | ICD-10-CM | POA: Diagnosis not present

## 2020-06-18 DIAGNOSIS — F3130 Bipolar disorder, current episode depressed, mild or moderate severity, unspecified: Secondary | ICD-10-CM

## 2020-06-18 NOTE — Patient Instructions (Signed)
1.Continue Abilify5 mg daily/worsening in irritability at higher dose 2.Discontinue Lunesta 3. Next appointment:1/24 at 9:30

## 2020-06-28 ENCOUNTER — Ambulatory Visit
Admission: EM | Admit: 2020-06-28 | Discharge: 2020-06-28 | Disposition: A | Discharge status: Home / Self Care | Payer: Medicare Other | Attending: Emergency Medicine | Admitting: Emergency Medicine

## 2020-06-28 ENCOUNTER — Other Ambulatory Visit: Payer: Self-pay

## 2020-06-28 DIAGNOSIS — R059 Cough, unspecified: Secondary | ICD-10-CM

## 2020-06-28 DIAGNOSIS — H66001 Acute suppurative otitis media without spontaneous rupture of ear drum, right ear: Secondary | ICD-10-CM | POA: Diagnosis not present

## 2020-06-28 DIAGNOSIS — R0981 Nasal congestion: Secondary | ICD-10-CM

## 2020-06-28 DIAGNOSIS — Z1152 Encounter for screening for COVID-19: Secondary | ICD-10-CM

## 2020-06-28 MED ORDER — AMOXICILLIN 500 MG PO CAPS: 500.0000 mg | ORAL_CAPSULE | Freq: Two times a day (BID) | ORAL | 0 refills | Status: AC

## 2020-06-28 MED ORDER — BENZONATATE 100 MG PO CAPS
100.0000 mg | ORAL_CAPSULE | Freq: Three times a day (TID) | ORAL | 0 refills | Status: DC
Start: 2020-06-28 — End: 2020-11-26
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
## 2020-06-28 NOTE — Discharge Instructions (Signed)
COVID testing ordered.  It will take between 5-7 days for test results.  Someone will contact you regarding abnormal results.    In the meantime: You should remain isolated in your home for 10 days from symptom onset AND greater than 72 hours after symptoms resolution (absence of fever without the use of fever-reducing medication and improvement in respiratory symptoms), whichever is longer Get plenty of rest and push fluids Tessalon Perles prescribed for cough Amoxicillin for RT ear infection Use OTC zyrtec for nasal congestion, runny nose, and/or sore throat Use OTC flonase for nasal congestion and runny nose Use medications daily for symptom relief Use OTC medications like ibuprofen or tylenol as needed fever or pain Call or go to the ED if you have any new or worsening symptoms such as fever, worsening cough, shortness of breath, chest tightness, chest pain, turning blue, changes in mental status, etc..Marland Kitchen

## 2020-06-28 NOTE — ED Triage Notes (Signed)
Pt presents with nasal congestion and sinus pressure for past 3 or 4 days

## 2020-06-29 LAB — COVID-19, FLU A+B AND RSV
Influenza A, NAA: NOT DETECTED
Influenza B, NAA: NOT DETECTED
RSV, NAA: NOT DETECTED
SARS-CoV-2, NAA: NOT DETECTED
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
## 2020-08-20 ENCOUNTER — Encounter: Payer: Self-pay | Admitting: Psychiatry

## 2020-08-20 ENCOUNTER — Telehealth (INDEPENDENT_AMBULATORY_CARE_PROVIDER_SITE_OTHER): Payer: Medicare Other | Admitting: Psychiatry

## 2020-08-20 ENCOUNTER — Other Ambulatory Visit: Payer: Self-pay

## 2020-08-20 DIAGNOSIS — F4310 Post-traumatic stress disorder, unspecified: Secondary | ICD-10-CM

## 2020-08-20 DIAGNOSIS — F3130 Bipolar disorder, current episode depressed, mild or moderate severity, unspecified: Secondary | ICD-10-CM | POA: Diagnosis not present

## 2020-08-20 MED ORDER — ARIPIPRAZOLE 5 MG PO TABS: 5.0000 mg | ORAL_TABLET | Freq: Every day | ORAL | 0 refills | Status: DC

## 2020-09-10 ENCOUNTER — Telehealth: Payer: Self-pay

## 2020-09-10 ENCOUNTER — Encounter: Payer: Self-pay | Admitting: Psychiatry

## 2020-09-10 NOTE — Telephone Encounter (Signed)
Done

## 2020-09-10 NOTE — Telephone Encounter (Signed)
pt wanted to know if she can get a note for jury duty she states she can not handle doing that.  she wants letter mailed to her.

## 2020-10-04 ENCOUNTER — Other Ambulatory Visit (HOSPITAL_COMMUNITY): Payer: Self-pay | Admitting: Internal Medicine

## 2020-10-04 DIAGNOSIS — Z1231 Encounter for screening mammogram for malignant neoplasm of breast: Secondary | ICD-10-CM

## 2020-10-07 ENCOUNTER — Encounter: Payer: Self-pay | Admitting: Emergency Medicine

## 2020-10-07 ENCOUNTER — Ambulatory Visit
Admission: EM | Admit: 2020-10-07 | Discharge: 2020-10-07 | Disposition: A | Discharge status: Home / Self Care | Payer: Medicare Other | Attending: Emergency Medicine | Admitting: Emergency Medicine

## 2020-10-07 ENCOUNTER — Ambulatory Visit (INDEPENDENT_AMBULATORY_CARE_PROVIDER_SITE_OTHER): Payer: Medicare Other

## 2020-10-07 DIAGNOSIS — M25571 Pain in right ankle and joints of right foot: Secondary | ICD-10-CM

## 2020-10-07 DIAGNOSIS — M79671 Pain in right foot: Secondary | ICD-10-CM | POA: Diagnosis not present

## 2020-10-07 DIAGNOSIS — M7989 Other specified soft tissue disorders: Secondary | ICD-10-CM | POA: Diagnosis not present

## 2020-10-07 DIAGNOSIS — M25471 Effusion, right ankle: Secondary | ICD-10-CM

## 2020-10-07 MED ORDER — COLCHICINE 0.6 MG PO TABS: 0.6000 mg | ORAL_TABLET | Freq: Every day | ORAL | 0 refills | Status: DC

## 2020-10-07 NOTE — Discharge Instructions (Addendum)
Ace wrap was applied in the office Colchicine was prescribed/take as directed Continue to take OTC Tylenol as needed for pain Follow-up with PCP Follow RICE instruction that is attached Return or go to ED if you develop any new or worsening of your symptoms
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
## 2020-10-07 NOTE — ED Triage Notes (Signed)
Pain/swelling  to top of RT foot x 2-3 days ago. Denies any injury.

## 2020-10-08 ENCOUNTER — Inpatient Hospital Stay (HOSPITAL_COMMUNITY): Admission: RE | Admit: 2020-10-08 | Payer: Medicare Other | Source: Ambulatory Visit

## 2020-11-12 ENCOUNTER — Ambulatory Visit (HOSPITAL_COMMUNITY): Payer: Medicare Other

## 2020-11-16 ENCOUNTER — Ambulatory Visit: Payer: Medicare Other | Admitting: Podiatry

## 2020-11-16 ENCOUNTER — Telehealth: Payer: Medicare Other | Admitting: Psychiatry
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
## 2020-11-21 ENCOUNTER — Ambulatory Visit (HOSPITAL_COMMUNITY): Payer: Medicare Other

## 2020-11-26 ENCOUNTER — Ambulatory Visit (INDEPENDENT_AMBULATORY_CARE_PROVIDER_SITE_OTHER): Payer: Medicare Other | Admitting: Cardiology

## 2020-11-26 ENCOUNTER — Other Ambulatory Visit: Payer: Self-pay

## 2020-11-26 ENCOUNTER — Telehealth (INDEPENDENT_AMBULATORY_CARE_PROVIDER_SITE_OTHER): Payer: Medicare Other | Admitting: Psychiatry

## 2020-11-26 ENCOUNTER — Encounter: Payer: Self-pay | Admitting: Psychiatry

## 2020-11-26 ENCOUNTER — Encounter: Payer: Self-pay | Admitting: Cardiology

## 2020-11-26 VITALS — BP 170/100 | HR 79 | Ht 61.75 in | Wt 181.4 lb

## 2020-11-26 DIAGNOSIS — F3130 Bipolar disorder, current episode depressed, mild or moderate severity, unspecified: Secondary | ICD-10-CM | POA: Diagnosis not present

## 2020-11-26 DIAGNOSIS — I10 Essential (primary) hypertension: Secondary | ICD-10-CM

## 2020-11-26 DIAGNOSIS — E782 Mixed hyperlipidemia: Secondary | ICD-10-CM | POA: Diagnosis not present

## 2020-11-26 DIAGNOSIS — R079 Chest pain, unspecified: Secondary | ICD-10-CM

## 2020-11-26 DIAGNOSIS — F4310 Post-traumatic stress disorder, unspecified: Secondary | ICD-10-CM | POA: Diagnosis not present

## 2020-11-26 MED ORDER — AMLODIPINE BESYLATE 10 MG PO TABS: 10.0000 mg | ORAL_TABLET | Freq: Every day | ORAL | 3 refills | Status: DC

## 2020-11-26 MED ORDER — CARIPRAZINE HCL 1.5 MG PO CAPS: 1.5000 mg | ORAL_CAPSULE | Freq: Every day | ORAL | 0 refills | Status: DC

## 2020-11-26 MED ORDER — METOPROLOL TARTRATE 25 MG PO TABS: 25.0000 mg | ORAL_TABLET | Freq: Once | ORAL | 0 refills | Status: DC

## 2020-11-26 MED ORDER — ROSUVASTATIN CALCIUM 10 MG PO TABS: 10.0000 mg | ORAL_TABLET | Freq: Every day | ORAL | 3 refills | Status: DC

## 2020-11-26 NOTE — Patient Instructions (Signed)
1. Start Vraylar 1.5 mg daily  2. Discontinue Abilify  3. Next appointment:6/1 at 3:30

## 2020-11-26 NOTE — Patient Instructions (Addendum)
Medication Instructions:  Start Crestor 20 mg tablets daily INCREASE Norvasc tablets to 10 mg daily *If you need a refill on your cardiac medications before your next appointment, please call your pharmacy*   Lab Work: NONE If you have labs (blood work) drawn today and your tests are completely normal, you will receive your results only by: Marland Kitchen MyChart Message (if you have MyChart) OR . A paper copy in the mail If you have any lab test that is abnormal or we need to change your treatment, we will call you to review the results.   Testing/Procedures: Cardiac CT Angiography (CTA), is a special type of CT scan that uses a computer to produce multi-dimensional views of major blood vessels throughout the body. In CT angiography, a contrast material is injected through an IV to help visualize the blood vessels    Follow-Up: At [facility], you and your health needs are our priority.  As part of our continuing mission to provide you with exceptional heart care, we have created designated Provider Care Teams.  These Care Teams include your primary Cardiologist (physician) and Advanced Practice Providers (APPs -  Physician Assistants and Nurse Practitioners) who all work together to provide you with the care you need, when you need it.  We recommend signing up for the patient portal called "MyChart".  Sign up information is provided on this After Visit Summary.  MyChart is used to connect with patients for Virtual Visits (Telemedicine).  Patients are able to view lab/test results, encounter notes, upcoming appointments, etc.  Non-urgent messages can be sent to your provider as well.   To learn more about what you can do with MyChart, go to [email or web address].    Your next appointment:   1 month(s)  The format for your next appointment:   In Person  Provider:   You may see Carlyle Dolly, MD or one of the following Advanced Practice Providers on your designated Care Team:    Bernerd Pho, PA-C   Ermalinda Barrios, PA-C     Other Instructions Update our office in 1 week with BP log.      Your cardiac CT will be scheduled at one of the below locations:   [facility] [address] [place], [place] [address] [phone]  [facility] [address] [place], [place] [address] [phone]  If scheduled at [facility], please arrive at the [facility] main entrance (entrance A) of [facility] 30 minutes prior to test start time. Proceed to the [facility] Radiology Department (first floor) to check-in and test prep.  If scheduled at [facility], please arrive 15 mins early for check-in and test prep.  Please follow these instructions carefully (unless otherwise directed):   On the Night Before the Test: . Be sure to Drink plenty of water. . Do not consume any caffeinated/decaffeinated beverages or chocolate 12 hours prior to your test. . Do not take any antihistamines 12 hours prior to your test.  On the Day of the Test: . Drink plenty of water until 1 hour prior to the test. . Do not eat any food 4 hours prior to the test. . You may take your regular medications prior to the test.  . Take metoprolol (Lopressor) two hours prior to test. . HOLD Furosemide/Hydrochlorothiazide morning of the test. . FEMALES- please wear underwire-free bra if available   *For Clinical Staff only. Please instruct patient the following:*  Heart Rate Medication Recommendations for Cardiac CT  . Resting HR < 50 bpm  o No medication  . Resting HR 50-60 bpm and BP >110/50 mmHG   o Consider Metoprolol tartrate 25 mg PO 90-120 min prior to scan  . Resting HR 60-65 bpm and BP >110/50 mmHG  o Metoprolol tartrate 50 mg PO 90-120 minutes prior to scan   . Resting HR > 65 bpm and BP >110/50 mmHG  o Metoprolol tartrate 100 mg PO 90-120 minutes prior to scan  . Consider  Ivabradine 10-15 mg PO or a calcium channel blocker for resting HR >60 bpm and contraindication to metoprolol tartrate  . Consider Ivabradine 10-15 mg PO in combination with metoprolol tartrate for HR >80 bpm         After the Test: . Drink plenty of water. . After receiving IV contrast, you may experience a mild flushed feeling. This is normal. . On occasion, you may experience a mild rash up to 24 hours after the test. This is not dangerous. If this occurs, you can take Benadryl 25 mg and increase your fluid intake. . If you experience trouble breathing, this can be serious. If it is severe call 911 IMMEDIATELY. If it is mild, please call our office. . If you take any of these medications: Glipizide/Metformin, Avandament, Glucavance, please do not take 48 hours after completing test unless otherwise instructed.   Once we have confirmed authorization from your insurance company, we will call you to set up a date and time for your test. Based on how quickly your insurance processes prior authorizations requests, please allow up to 4 weeks to be contacted for scheduling your Cardiac CT appointment. Be advised that routine Cardiac CT appointments could be scheduled as many as 8 weeks after your provider has ordered it.  For non-scheduling related questions, please contact the cardiac imaging nurse navigator should you have any questions/concerns: Marchia Bond, Cardiac Imaging Nurse Navigator Gordy Clement, Cardiac Imaging Nurse Navigator [facility] Direct Office Dial: [phone]   For scheduling needs, including cancellations and rescheduling, please call [place], [phone].

## 2020-11-26 NOTE — Progress Notes (Signed)
Clinical Summary [name] is a 55 y.o.female  seen today for follow up of the following medical problems.   1. Chest pain -self reported history of MI in 2014at [facility]. Had cath, per report she reports there was no significant blockagesand was managed medically  - [facility] notes mention normal cath, anomalous LCX. Thought could be vasospasm, had been managed on norvasc.   - occasional chest pains, tightness midchest. Similar to prior pain, though more intense. 3/10 in severity, can be worst with laying on left side. Typically last 15 min up 1.5 hour constant. No relation to eating.  Does regular yard work, can have some DOE associated with it. Can have some tightness at same time. - occurs about 2-3 times a week. Stable in frquency  CAD risk factors: HTN, age, prediabetic,former smoker x 45 quit 2 years ago, mother CABG mid 33s.   2. HTN -compliant with meds - home bp's 150s-180s/80s - she is on norvasc 5mg  daily, hyzaar 100/12.5. Does not appear she started the chlorthalidone we had discussed at last visit in 2020    3. Palpitations - feeling of heart fluttering. Can be painful. Occurs daily, lasts just a few seconds - can occur at rest or with exertion. Can feel lightheaded, +SOB - coffee x 2-3, 4-5 mountain dew cans, rare, no EtOH - ongoing for a few years, increase in frequency.  - TSH 1.46,   - 09/2017 holter with isolated PVCs and PACs overall rate. - has been cutting back on caffeine. Coffee 1-2 cups, sodas 3 per day. - symptoms have improved.  - now off lopressor due to low heart rates, had been on. Bradycardia to 40sinER.     4.Hyperlipidemia 3/2022TC 186 TG 120 LDL 121  - she stopped her statin on her own, denies any side effects  SH: has 7 chickens at home   Past Medical History:  Diagnosis Date  . Anxiety   . Arthritis    "pretty much all over; mainly neck and back" (12/26/2015)  . Asthma   . Bipolar 1 disorder ([place])    . Bipolar disorder ([place])   . Cervical cancer ([place])    "stage I"  . Chronic back pain   . Chronic bronchitis ([place])   . COPD (chronic obstructive pulmonary disease) ([place])   . Degenerative disc disease   . Depression   . Dysrhythmia   . Fibromyalgia   . GERD (gastroesophageal reflux disease)   . Hepatic cyst   . High cholesterol   . History of hiatal hernia   . Hypertension 08/04/2012  . IBS (irritable bowel syndrome) Diagnosed November 2012  . Incisional hernia with gangrene and obstruction 12/26/2015  . Melanoma of back ([place])   . Ovarian cancer ([place])    "stage II"  . Plantar fasciitis, bilateral   . Sleep apnea    cpap coming  "soon" (12/26/2015)     Allergies  Allergen Reactions  . Barium-Containing Compounds Other (See Comments)    Stomach cramps, extreme diarrhea, and vomiting  . Bee Venom Anaphylaxis  . Influenza Virus Vaccine Swelling    Trouble swallowing Trouble swallowing   . Seldane [Terfenadine] Nausea And Vomiting and Other (See Comments)    CAUSES TOP LAYER OF SKIN TO PEEL  . Sulfa Antibiotics Anaphylaxis  . Lisinopril Cough  . Lithium Other (See Comments)    Agitation and hostility  . Tetracyclines & Related Hives and Nausea And Vomiting  . Celebrex [Celecoxib] Nausea And Vomiting  .  Trazodone And Nefazodone     Sick  . Zinc Nausea And Vomiting  . Aspirin Rash and Other (See Comments)    Upset stomach  . Ativan [Lorazepam] Other (See Comments)    Irritability  . Erythromycin Nausea And Vomiting, Rash and Other (See Comments)    Cramps  . Lipitor [Atorvastatin] Nausea And Vomiting     Current Outpatient Medications  Medication Sig Dispense Refill  . amLODipine (NORVASC) 5 MG tablet Take 5 mg by mouth daily.    . ARIPiprazole (ABILIFY) 5 MG tablet Take 1 tablet (5 mg total) by mouth daily. 90 tablet 0  . benzonatate (TESSALON) 100 MG capsule Take 1 capsule (100 mg total) by mouth every 8 (eight) hours. 21 capsule 0  . colchicine 0.6 MG tablet Take  1 tablet (0.6 mg total) by mouth daily. 14 tablet 0  . diphenhydrAMINE (BENADRYL) 50 MG capsule Take 50 mg by mouth at bedtime as needed.    [name] EPINEPHrine 0.3 mg/0.3 mL IJ SOAJ injection Inject into the muscle.    . eszopiclone (LUNESTA) 2 MG TABS tablet Take 1 tablet (2 mg total) by mouth at bedtime as needed for sleep. Take immediately before bedtime 30 tablet 1  . losartan-hydrochlorothiazide (HYZAAR) 100-12.5 MG tablet Take 1 tablet by mouth daily.    . valACYclovir (VALTREX) 1000 MG tablet Take 1 tablet (1,000 mg total) by mouth 3 (three) times daily. 21 tablet 0   No current facility-administered medications for this visit.     Past Surgical History:  Procedure Laterality Date  . ABDOMINAL HYSTERECTOMY  1997  . APPENDECTOMY    . CARDIAC CATHETERIZATION  2015  . CARPAL TUNNEL RELEASE Right   . CESAREAN SECTION  1987  . CHOLECYSTECTOMY OPEN  1998  . COLONOSCOPY  November 2012  . EYE SURGERY     CATARACTS REMOVED 09/09/17 and 09/16/17  . HERNIA REPAIR    . INCISIONAL HERNIA REPAIR N/A 12/26/2015   Procedure: LAPAROSCOPIC REPAIR INCISIONAL HERNIA WITH MESH;  Surgeon: Fanny Skates, MD;  Location: [facility];  Service: General;  Laterality: N/A;  . INSERTION OF MESH N/A 12/26/2015   Procedure: INSERTION OF MESH;  Surgeon: Fanny Skates, MD;  Location: [facility];  Service: General;  Laterality: N/A;  . [place]  . LAPAROSCOPIC INCISIONAL / UMBILICAL / VENTRAL HERNIA REPAIR  12/26/2015   repair of incarcerated incisional hernia with mesh  . LIVER CYST REMOVAL  2014  . MELANOMA EXCISION  January 2013   removed from back  . SHOULDER SURGERY Left 2010   Humeral Head Microfracture   . TUBAL LIGATION    . UPPER GASTROINTESTINAL ENDOSCOPY  November 2012     Allergies  Allergen Reactions  . Barium-Containing Compounds Other (See Comments)    Stomach cramps, extreme diarrhea, and vomiting  . Bee Venom Anaphylaxis  . Influenza Virus Vaccine Swelling    Trouble  swallowing Trouble swallowing   . Seldane [Terfenadine] Nausea And Vomiting and Other (See Comments)    CAUSES TOP LAYER OF SKIN TO PEEL  . Sulfa Antibiotics Anaphylaxis  . Lisinopril Cough  . Lithium Other (See Comments)    Agitation and hostility  . Tetracyclines & Related Hives and Nausea And Vomiting  . Celebrex [Celecoxib] Nausea And Vomiting  . Trazodone And Nefazodone     Sick  . Zinc Nausea And Vomiting  . Aspirin Rash and Other (See Comments)    Upset stomach  . Ativan [Lorazepam] Other (See Comments)  Irritability  . Erythromycin Nausea And Vomiting, Rash and Other (See Comments)    Cramps  . Lipitor [Atorvastatin] Nausea And Vomiting      Family History  Adopted: Yes  Problem Relation Age of Onset  . Bipolar disorder Mother        never diagnosed but patient suspects mother had bipolar disorder.Tammy KitchenMarland KitchenMarland KitchenMarland Kitchenpt was adopeted, only knew birth mother for a short period of time.  [name] Anxiety disorder Mother   . Cirrhosis Mother   . Diabetes Mother   . Turner syndrome Daughter   . Cancer Daughter   . Bipolar disorder Daughter   . Interstitial cystitis Daughter   . ADD / ADHD Neg Hx   . Alcohol abuse Neg Hx   . Drug abuse Neg Hx   . Dementia Neg Hx   . Depression Neg Hx   . OCD Neg Hx   . Paranoid behavior Neg Hx   . Schizophrenia Neg Hx   . Seizures Neg Hx   . Sexual abuse Neg Hx   . Physical abuse Neg Hx   . Colon cancer Neg Hx      Social History [name] reports that she quit smoking about 2 years ago. Her smoking use included cigarettes. She smoked 0.00 packs per day for 0.00 years. She uses smokeless tobacco. [name] reports no history of alcohol use.   Review of Systems CONSTITUTIONAL: No weight loss, fever, chills, weakness or fatigue.  HEENT: Eyes: No visual loss, blurred vision, double vision or yellow sclerae.No hearing loss, sneezing, congestion, runny nose or sore throat.  SKIN: No rash or itching.  CARDIOVASCULAR: per hpi RESPIRATORY: No  shortness of breath, cough or sputum.  GASTROINTESTINAL: No anorexia, nausea, vomiting or diarrhea. No abdominal pain or blood.  GENITOURINARY: No burning on urination, no polyuria NEUROLOGICAL: No headache, dizziness, syncope, paralysis, ataxia, numbness or tingling in the extremities. No change in bowel or bladder control.  MUSCULOSKELETAL: No muscle, back pain, joint pain or stiffness.  LYMPHATICS: No enlarged nodes. No history of splenectomy.  PSYCHIATRIC: No history of depression or anxiety.  ENDOCRINOLOGIC: No reports of sweating, cold or heat intolerance. No polyuria or polydipsia.  [name]   Physical Examination Today's Vitals   11/26/20 1418  BP: (!) 170/100  Pulse: 79  SpO2: 98%  Weight: 181 lb 6.4 oz (82.3 kg)  Height: 5' 1.75" (1.568 m)   Body mass index is 33.45 kg/m.  Gen: resting comfortably, no acute distress HEENT: no scleral icterus, pupils equal round and reactive, no palptable cervical adenopathy,  CV: RRR, no m/r/g, no jvd Resp: Clear to auscultation bilaterally GI: abdomen is soft, non-tender, non-distended, normal bowel sounds, no hepatosplenomegaly MSK: extremities are warm, no edema.  Skin: warm, no rash Neuro:  no focal deficits Psych: appropriate affect   Diagnostic Studies 09/2017 holter  48 hr holter monitor  Rare ventricular ectopy, all in the form of isolated PVCs  Rare supraventricular ectopy, primarily in the form of PACs.  Min HR 45, Max HR 121, Avg HR 86  No diary submitted  No significant arrhythmias     Assessment and Plan  1.Chest pain - change in her chronic symptoms. Negative work up in 2014 with cath other than anamalous LCX. Mulitple CAD risk factors as reported above - would obtain coronary CTA for further evaluation - EKG today SR, chronic IVCD  2. HTN - above goal, we will increase her norvasc to 10mg  daily.  - she will call and udpate [place] with bp's in  1 week.   3. Hyperlipidemia - restart her crestor. She denies  any side effects, just had stopped taking    Has had some bradycardia on lopressor in the past. 11/2017 she was on lopressor 50 mg bid. Would dose just 25mg  once for her CTA   F/u 3 months    Arnoldo Lenis, M.D.

## 2020-11-28 ENCOUNTER — Telehealth (HOSPITAL_COMMUNITY): Payer: Self-pay | Admitting: Emergency Medicine

## 2020-11-28 NOTE — Telephone Encounter (Signed)
Reaching out to patient to offer assistance regarding upcoming cardiac imaging study; pt verbalizes understanding of appt date/time, parking situation and where to check in, pre-test NPO status and medications ordered, and verified current allergies; name and call back number provided for further questions should they arise Tammy Bond RN Navigator Cardiac Imaging [facility] [phone] office [phone] cell  Metoprolol tartrate 2hr prior

## 2020-11-29 ENCOUNTER — Ambulatory Visit (HOSPITAL_COMMUNITY)
Admission: RE | Admit: 2020-11-29 | Discharge: 2020-11-29 | Disposition: A | Discharge status: Home / Self Care | Payer: Medicare Other | Source: Ambulatory Visit | Attending: Cardiology | Admitting: Cardiology

## 2020-11-29 ENCOUNTER — Other Ambulatory Visit: Payer: Self-pay

## 2020-11-29 ENCOUNTER — Encounter (HOSPITAL_COMMUNITY): Payer: Self-pay

## 2020-11-29 DIAGNOSIS — G8929 Other chronic pain: Secondary | ICD-10-CM | POA: Diagnosis not present

## 2020-11-29 DIAGNOSIS — K760 Fatty (change of) liver, not elsewhere classified: Secondary | ICD-10-CM | POA: Diagnosis not present

## 2020-11-29 DIAGNOSIS — J984 Other disorders of lung: Secondary | ICD-10-CM | POA: Insufficient documentation

## 2020-11-29 DIAGNOSIS — Z87891 Personal history of nicotine dependence: Secondary | ICD-10-CM | POA: Diagnosis not present

## 2020-11-29 DIAGNOSIS — R079 Chest pain, unspecified: Secondary | ICD-10-CM

## 2020-11-29 DIAGNOSIS — M79673 Pain in unspecified foot: Secondary | ICD-10-CM | POA: Insufficient documentation

## 2020-11-29 DIAGNOSIS — I10 Essential (primary) hypertension: Secondary | ICD-10-CM | POA: Insufficient documentation

## 2020-11-29 DIAGNOSIS — Z951 Presence of aortocoronary bypass graft: Secondary | ICD-10-CM | POA: Diagnosis not present

## 2020-11-29 DIAGNOSIS — R7303 Prediabetes: Secondary | ICD-10-CM | POA: Diagnosis not present

## 2020-11-29 LAB — POCT I-STAT, CHEM 8
BUN: 11 mg/dL (ref 6–20)
Calcium, Ion: 1.21 mmol/L (ref 1.15–1.40)
Chloride: 99 mmol/L (ref 98–111)
Creatinine, Ser: 0.9 mg/dL (ref 0.44–1.00)
Glucose, Bld: 92 mg/dL (ref 70–99)
HCT: 39 % (ref 36.0–46.0)
Hemoglobin: 13.3 g/dL (ref 12.0–15.0)
Potassium: 3.2 mmol/L — ABNORMAL LOW (ref 3.5–5.1)
Sodium: 141 mmol/L (ref 135–145)
TCO2: 26 mmol/L (ref 22–32)

## 2020-11-29 MED ORDER — IOHEXOL 350 MG/ML SOLN
95.0000 mL | Freq: Once | INTRAVENOUS | Status: AC | PRN
  Administered 2020-11-29: 95 mL via INTRAVENOUS

## 2020-11-29 MED ORDER — NITROGLYCERIN 0.4 MG SL SUBL
SUBLINGUAL_TABLET | SUBLINGUAL | Status: AC
  Filled 2020-11-29: qty 2

## 2020-11-29 MED ORDER — METOPROLOL TARTRATE 5 MG/5ML IV SOLN: 10.0000 mg | Freq: Once | INTRAVENOUS | Status: DC | PRN

## 2020-11-29 MED ORDER — METOPROLOL TARTRATE 5 MG/5ML IV SOLN
INTRAVENOUS | Status: AC
  Filled 2020-11-29: qty 5

## 2020-11-29 MED ORDER — NITROGLYCERIN 0.4 MG SL SUBL
0.8000 mg | SUBLINGUAL_TABLET | Freq: Once | SUBLINGUAL | Status: AC | PRN
  Administered 2020-11-29: 0.8 mg via SUBLINGUAL

## 2020-11-29 NOTE — Progress Notes (Signed)
   11/29/20 1457  Vital Signs  Pulse Rate 76  BP (!) 150/81  MAP (mmHg) 95  BP Location Left Arm  BP Method Automatic  Patient Position (if appropriate) Sitting  MEWS Score  MEWS Temp 0  MEWS Systolic 0  MEWS Pulse 0  MEWS RR 0  MEWS LOC 0  MEWS Score 0  MEWS Score Color Green    CT cardiac completed. Rechecked BP x2 after, no complaints of HA, dizziness. Discharged from CT in stable condition

## 2020-11-30 ENCOUNTER — Telehealth: Payer: Self-pay | Admitting: *Deleted

## 2020-11-30 MED ORDER — POTASSIUM CHLORIDE CRYS ER 20 MEQ PO TBCR: EXTENDED_RELEASE_TABLET | ORAL | 0 refills | Status: DC

## 2020-11-30 NOTE — Telephone Encounter (Signed)
-----   Message from Arnoldo Lenis, MD sent at 11/30/2020  4:18 PM EDT ----- Potassium was low on pre CT labs, can she take KCl 39mEq once daily x 3 days please   Zandra Abts MD

## 2020-11-30 NOTE — Telephone Encounter (Signed)
Pt voiced understanding - Medication sent to pharmacy ° °

## 2020-12-03 ENCOUNTER — Telehealth: Payer: Self-pay | Admitting: *Deleted

## 2020-12-03 DIAGNOSIS — I10 Essential (primary) hypertension: Secondary | ICD-10-CM

## 2020-12-03 MED ORDER — LOSARTAN POTASSIUM-HCTZ 100-25 MG PO TABS: 1.0000 | ORAL_TABLET | Freq: Every day | ORAL | 1 refills | Status: DC

## 2020-12-03 NOTE — Telephone Encounter (Signed)
-----   Message from Arnoldo Lenis, MD sent at 12/03/2020  2:18 PM EDT ----- BP's too high, please change her hyzaar to the 100/25mg  tablet. Recheck a bmet/mg in 3 weeks  Zandra Abts MD

## 2020-12-03 NOTE — Telephone Encounter (Signed)
Pt voiced understanding - updated medication list and pt will have labs done at [facility] in 3 weeks
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
## 2020-12-26 ENCOUNTER — Telehealth (INDEPENDENT_AMBULATORY_CARE_PROVIDER_SITE_OTHER): Payer: Medicare Other | Admitting: Psychiatry

## 2020-12-26 ENCOUNTER — Telehealth: Payer: Medicare Other | Admitting: Psychiatry

## 2020-12-26 ENCOUNTER — Other Ambulatory Visit: Payer: Self-pay

## 2020-12-26 ENCOUNTER — Encounter: Payer: Self-pay | Admitting: Psychiatry

## 2020-12-26 DIAGNOSIS — F4310 Post-traumatic stress disorder, unspecified: Secondary | ICD-10-CM

## 2020-12-26 DIAGNOSIS — F3130 Bipolar disorder, current episode depressed, mild or moderate severity, unspecified: Secondary | ICD-10-CM

## 2020-12-26 MED ORDER — BUSPIRONE HCL 5 MG PO TABS: 5.0000 mg | ORAL_TABLET | Freq: Two times a day (BID) | ORAL | 0 refills | Status: AC

## 2020-12-26 MED ORDER — ARIPIPRAZOLE 5 MG PO TABS: 5.0000 mg | ORAL_TABLET | Freq: Every day | ORAL | 0 refills | Status: DC

## 2020-12-26 NOTE — Patient Instructions (Signed)
1. Discontinue Vralar 2. Start Abilify 5 mg at night  3. Star BuSpar 5 mg twice a day.  4. Next appointment:7/1 at 10 AM

## 2021-01-10 ENCOUNTER — Ambulatory Visit: Payer: Medicare Other | Admitting: Cardiology
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
## 2021-01-25 ENCOUNTER — Telehealth: Payer: Medicare Other | Admitting: Psychiatry

## 2021-02-08 ENCOUNTER — Ambulatory Visit: Payer: Medicare Other | Admitting: Cardiology
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
## 2021-02-19 ENCOUNTER — Other Ambulatory Visit: Payer: Self-pay

## 2021-02-19 ENCOUNTER — Encounter: Payer: Self-pay | Admitting: Psychiatry

## 2021-02-19 ENCOUNTER — Telehealth (INDEPENDENT_AMBULATORY_CARE_PROVIDER_SITE_OTHER): Payer: Medicare Other | Admitting: Psychiatry

## 2021-02-19 DIAGNOSIS — F3130 Bipolar disorder, current episode depressed, mild or moderate severity, unspecified: Secondary | ICD-10-CM

## 2021-02-19 DIAGNOSIS — F4310 Post-traumatic stress disorder, unspecified: Secondary | ICD-10-CM

## 2021-02-19 MED ORDER — BUSPIRONE HCL 5 MG PO TABS: 5.0000 mg | ORAL_TABLET | Freq: Two times a day (BID) | ORAL | 0 refills | Status: AC

## 2021-02-19 MED ORDER — METFORMIN HCL 500 MG PO TABS: 250.0000 mg | ORAL_TABLET | Freq: Two times a day (BID) | ORAL | 1 refills | Status: DC

## 2021-02-19 NOTE — Patient Instructions (Signed)
1. Continue Abilify 5 mg at night  2. Continue BuSpar 5 mg twice a day. 3. Start metformin 250 mg twice a day  3. Next appointment: 9/2 at 8 AM

## 2021-03-04 ENCOUNTER — Ambulatory Visit: Payer: Medicare Other | Admitting: Cardiology
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
## 2021-03-29 ENCOUNTER — Other Ambulatory Visit: Payer: Self-pay

## 2021-03-29 ENCOUNTER — Telehealth: Payer: Medicare Other | Admitting: Psychiatry

## 2021-03-29 NOTE — Telephone Encounter (Signed)
Sent link for video visit through Epic. Patient did not sign in. Called the patient for appointment scheduled today. The patient did not answer the phone. Left voice message to contact the office ([phone]).   ?

## 2021-04-02 ENCOUNTER — Telehealth: Payer: Self-pay | Admitting: *Deleted

## 2021-04-02 NOTE — Telephone Encounter (Signed)
Patient called the [facility] in [place] requesting that provider please call her back. [phone]. Patient did not leave a message.

## 2021-04-02 NOTE — Telephone Encounter (Signed)
noted 

## 2021-04-02 NOTE — Telephone Encounter (Signed)
Talked with the patient. She states that she left messages several times to [facility] to cancel the prior appointment due to change in her phone numbers. She verbalized understanding that this clinician would reach out to the staff here about this. She has an upcoming appointment this month.

## 2021-04-10 ENCOUNTER — Telehealth: Payer: Medicare Other | Admitting: Psychiatry
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
## 2021-04-12 ENCOUNTER — Encounter: Payer: Self-pay | Admitting: Psychiatry

## 2021-04-12 ENCOUNTER — Other Ambulatory Visit: Payer: Self-pay

## 2021-04-12 ENCOUNTER — Telehealth (INDEPENDENT_AMBULATORY_CARE_PROVIDER_SITE_OTHER): Payer: Medicare Other | Admitting: Psychiatry

## 2021-04-12 DIAGNOSIS — F411 Generalized anxiety disorder: Secondary | ICD-10-CM

## 2021-04-12 DIAGNOSIS — F3130 Bipolar disorder, current episode depressed, mild or moderate severity, unspecified: Secondary | ICD-10-CM

## 2021-04-12 DIAGNOSIS — F4310 Post-traumatic stress disorder, unspecified: Secondary | ICD-10-CM | POA: Diagnosis not present

## 2021-04-12 MED ORDER — ARIPIPRAZOLE 5 MG PO TABS: 7.5000 mg | ORAL_TABLET | Freq: Every day | ORAL | 0 refills | Status: DC

## 2021-04-12 NOTE — Patient Instructions (Addendum)
1. Increase Abilify 7.5 mg at night  2. Continue BuSpar 5 mg twice a day 3. Discontinue metformin 3. Next appointment: 10/21 at 11:30

## 2021-04-17 ENCOUNTER — Ambulatory Visit (INDEPENDENT_AMBULATORY_CARE_PROVIDER_SITE_OTHER): Payer: Medicare Other | Admitting: Psychiatry

## 2021-04-17 ENCOUNTER — Other Ambulatory Visit: Payer: Self-pay

## 2021-04-17 ENCOUNTER — Encounter (HOSPITAL_COMMUNITY): Payer: Self-pay | Admitting: Psychiatry

## 2021-04-17 DIAGNOSIS — F4310 Post-traumatic stress disorder, unspecified: Secondary | ICD-10-CM

## 2021-04-17 DIAGNOSIS — F3130 Bipolar disorder, current episode depressed, mild or moderate severity, unspecified: Secondary | ICD-10-CM | POA: Diagnosis not present
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
## 2021-04-18 ENCOUNTER — Telehealth: Payer: Self-pay

## 2021-04-18 NOTE — Telephone Encounter (Signed)
Copied from [place] [phone]. Topic: Appointment Scheduling - Scheduling Inquiry for Clinic >> Apr 18, 2021  8:24 AM Valere Dross wrote: Reason for CRM: Pt called in wanting to get an appt with Tally Joe. Please advise

## 2021-04-18 NOTE — Telephone Encounter (Signed)
Advised pt we are not taking pts at this time.   Thanks,   -Mickel Baas

## 2021-05-01 ENCOUNTER — Ambulatory Visit (INDEPENDENT_AMBULATORY_CARE_PROVIDER_SITE_OTHER): Payer: Medicare Other | Admitting: Psychiatry

## 2021-05-01 ENCOUNTER — Other Ambulatory Visit: Payer: Self-pay

## 2021-05-01 ENCOUNTER — Encounter (HOSPITAL_COMMUNITY): Payer: Self-pay

## 2021-05-01 DIAGNOSIS — F3130 Bipolar disorder, current episode depressed, mild or moderate severity, unspecified: Secondary | ICD-10-CM | POA: Diagnosis not present

## 2021-05-01 NOTE — Plan of Care (Signed)
Pt participated in development of plan
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
## 2021-05-15 ENCOUNTER — Ambulatory Visit (INDEPENDENT_AMBULATORY_CARE_PROVIDER_SITE_OTHER): Payer: Medicare Other | Admitting: Psychiatry

## 2021-05-15 ENCOUNTER — Other Ambulatory Visit: Payer: Self-pay

## 2021-05-15 DIAGNOSIS — F3130 Bipolar disorder, current episode depressed, mild or moderate severity, unspecified: Secondary | ICD-10-CM

## 2021-05-15 DIAGNOSIS — F4310 Post-traumatic stress disorder, unspecified: Secondary | ICD-10-CM | POA: Diagnosis not present
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
## 2021-05-17 ENCOUNTER — Telehealth: Payer: Medicare Other | Admitting: Psychiatry

## 2021-05-29 ENCOUNTER — Other Ambulatory Visit: Payer: Self-pay

## 2021-05-29 ENCOUNTER — Ambulatory Visit (INDEPENDENT_AMBULATORY_CARE_PROVIDER_SITE_OTHER): Payer: Medicare Other | Admitting: Psychiatry

## 2021-05-29 DIAGNOSIS — F3130 Bipolar disorder, current episode depressed, mild or moderate severity, unspecified: Secondary | ICD-10-CM | POA: Diagnosis not present
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
## 2021-06-14 ENCOUNTER — Telehealth (INDEPENDENT_AMBULATORY_CARE_PROVIDER_SITE_OTHER): Payer: Medicare Other | Admitting: Psychiatry

## 2021-06-14 ENCOUNTER — Encounter: Payer: Self-pay | Admitting: Psychiatry

## 2021-06-14 ENCOUNTER — Other Ambulatory Visit: Payer: Self-pay

## 2021-06-14 DIAGNOSIS — F411 Generalized anxiety disorder: Secondary | ICD-10-CM | POA: Diagnosis not present

## 2021-06-14 DIAGNOSIS — F4310 Post-traumatic stress disorder, unspecified: Secondary | ICD-10-CM

## 2021-06-14 DIAGNOSIS — F3130 Bipolar disorder, current episode depressed, mild or moderate severity, unspecified: Secondary | ICD-10-CM

## 2021-06-14 MED ORDER — ARIPIPRAZOLE 10 MG PO TABS: 10.0000 mg | ORAL_TABLET | Freq: Every day | ORAL | 0 refills | Status: DC

## 2021-06-14 MED ORDER — BUSPIRONE HCL 5 MG PO TABS: 5.0000 mg | ORAL_TABLET | Freq: Two times a day (BID) | ORAL | 0 refills | Status: DC

## 2021-06-14 NOTE — Patient Instructions (Signed)
1. Increase Abilify 10 mg at night  2. Continue BuSpar 5 mg twice a day 3. Next appointment: 12/13 at 2:30

## 2021-06-26 ENCOUNTER — Other Ambulatory Visit: Payer: Self-pay

## 2021-06-26 ENCOUNTER — Ambulatory Visit (INDEPENDENT_AMBULATORY_CARE_PROVIDER_SITE_OTHER): Payer: Medicare Other | Admitting: Psychiatry

## 2021-06-26 DIAGNOSIS — F3130 Bipolar disorder, current episode depressed, mild or moderate severity, unspecified: Secondary | ICD-10-CM
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
## 2021-07-09 ENCOUNTER — Telehealth: Payer: Self-pay | Admitting: Psychiatry

## 2021-07-09 ENCOUNTER — Encounter: Payer: Self-pay | Admitting: Psychiatry

## 2021-07-09 ENCOUNTER — Telehealth (INDEPENDENT_AMBULATORY_CARE_PROVIDER_SITE_OTHER): Payer: Medicare Other | Admitting: Psychiatry

## 2021-07-09 ENCOUNTER — Other Ambulatory Visit: Payer: Self-pay

## 2021-07-09 DIAGNOSIS — F4310 Post-traumatic stress disorder, unspecified: Secondary | ICD-10-CM | POA: Diagnosis not present

## 2021-07-09 DIAGNOSIS — F411 Generalized anxiety disorder: Secondary | ICD-10-CM | POA: Diagnosis not present

## 2021-07-09 DIAGNOSIS — F3130 Bipolar disorder, current episode depressed, mild or moderate severity, unspecified: Secondary | ICD-10-CM | POA: Diagnosis not present

## 2021-07-09 MED ORDER — DIVALPROEX SODIUM ER 500 MG PO TB24: 500.0000 mg | ORAL_TABLET | Freq: Every day | ORAL | 0 refills | Status: DC

## 2021-07-09 NOTE — Telephone Encounter (Signed)
Called the patient again.  She wonders if she can try Vraylar.  She was reminded that she had adverse reaction of cold sweats from this medication.  She also asks if clonazepam can be used.  Provided psychoeducation about its potential risk and reason not to use this at this time.  She agrees to try Depakote at this time.

## 2021-07-09 NOTE — Patient Instructions (Signed)
Decrease Abilify 5 mg at night Start Depakote 500 mg at night Obtain blood test after 5 days of starting Depakote (VPA, LFT) Continue BuSpar 5 mg twice a day Next appointment- 1/11 at 10 Am

## 2021-07-09 NOTE — Telephone Encounter (Signed)
Tried to call her based on the request from the patient, who contacted the front desk to ask about her medication.   She was not available on the phone.  Left a message to contact the office.

## 2021-07-10 ENCOUNTER — Other Ambulatory Visit: Payer: Self-pay

## 2021-07-10 ENCOUNTER — Ambulatory Visit (INDEPENDENT_AMBULATORY_CARE_PROVIDER_SITE_OTHER): Payer: Medicare Other | Admitting: Psychiatry

## 2021-07-10 DIAGNOSIS — F3130 Bipolar disorder, current episode depressed, mild or moderate severity, unspecified: Secondary | ICD-10-CM
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
## 2021-07-24 ENCOUNTER — Other Ambulatory Visit: Payer: Self-pay

## 2021-07-24 ENCOUNTER — Telehealth: Payer: Self-pay | Admitting: Psychiatry

## 2021-07-24 ENCOUNTER — Ambulatory Visit (INDEPENDENT_AMBULATORY_CARE_PROVIDER_SITE_OTHER): Payer: Medicare Other | Admitting: Psychiatry

## 2021-07-24 DIAGNOSIS — F3130 Bipolar disorder, current episode depressed, mild or moderate severity, unspecified: Secondary | ICD-10-CM | POA: Diagnosis not present

## 2021-07-24 NOTE — Telephone Encounter (Signed)
Patient called in to state she is no longer taking depakote
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
## 2021-08-07 ENCOUNTER — Other Ambulatory Visit: Payer: Self-pay

## 2021-08-07 ENCOUNTER — Telehealth (INDEPENDENT_AMBULATORY_CARE_PROVIDER_SITE_OTHER): Payer: Medicare Other | Admitting: Psychiatry

## 2021-08-07 ENCOUNTER — Ambulatory Visit (INDEPENDENT_AMBULATORY_CARE_PROVIDER_SITE_OTHER): Payer: 59 | Admitting: Psychiatry

## 2021-08-07 ENCOUNTER — Encounter: Payer: Self-pay | Admitting: Psychiatry

## 2021-08-07 DIAGNOSIS — F411 Generalized anxiety disorder: Secondary | ICD-10-CM | POA: Diagnosis not present

## 2021-08-07 DIAGNOSIS — F4310 Post-traumatic stress disorder, unspecified: Secondary | ICD-10-CM

## 2021-08-07 DIAGNOSIS — F3130 Bipolar disorder, current episode depressed, mild or moderate severity, unspecified: Secondary | ICD-10-CM | POA: Diagnosis not present

## 2021-08-07 MED ORDER — BUSPIRONE HCL 5 MG PO TABS: 5.0000 mg | ORAL_TABLET | Freq: Two times a day (BID) | ORAL | 0 refills | Status: AC
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
## 2021-08-07 NOTE — Patient Instructions (Signed)
Continue Abilify 5 mg at night  Discontinue depakote Continue BuSpar 5 mg twice a day Next appointment- 2/20 at 2 PM, in person  The next visit will be in person visit. Please arrive 15 mins before the scheduled time.   [facility]  Address: [address], [place], [place] [address]

## 2021-08-21 ENCOUNTER — Ambulatory Visit (HOSPITAL_COMMUNITY): Payer: Medicare Other | Admitting: Psychiatry
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
## 2021-09-16 ENCOUNTER — Ambulatory Visit: Payer: 59 | Admitting: Psychiatry

## 2021-09-17 ENCOUNTER — Encounter: Payer: Self-pay | Admitting: Emergency Medicine

## 2021-09-17 ENCOUNTER — Ambulatory Visit
Admission: EM | Admit: 2021-09-17 | Discharge: 2021-09-17 | Disposition: A | Discharge status: Home / Self Care | Payer: Medicare Other | Attending: Family Medicine | Admitting: Family Medicine

## 2021-09-17 ENCOUNTER — Other Ambulatory Visit: Payer: Self-pay

## 2021-09-17 DIAGNOSIS — H6593 Unspecified nonsuppurative otitis media, bilateral: Secondary | ICD-10-CM | POA: Diagnosis not present

## 2021-09-17 DIAGNOSIS — R030 Elevated blood-pressure reading, without diagnosis of hypertension: Secondary | ICD-10-CM

## 2021-09-17 DIAGNOSIS — Z23 Encounter for immunization: Secondary | ICD-10-CM | POA: Diagnosis not present

## 2021-09-17 DIAGNOSIS — M62830 Muscle spasm of back: Secondary | ICD-10-CM | POA: Diagnosis not present

## 2021-09-17 DIAGNOSIS — T148XXA Other injury of unspecified body region, initial encounter: Secondary | ICD-10-CM

## 2021-09-17 DIAGNOSIS — S60812A Abrasion of left wrist, initial encounter: Secondary | ICD-10-CM

## 2021-09-17 MED ORDER — DEXAMETHASONE SODIUM PHOSPHATE 10 MG/ML IJ SOLN
10.0000 mg | Freq: Once | INTRAMUSCULAR | Status: AC
  Administered 2021-09-17: 10 mg via INTRAMUSCULAR

## 2021-09-17 MED ORDER — LOSARTAN POTASSIUM-HCTZ 100-25 MG PO TABS: 1.0000 | ORAL_TABLET | Freq: Every day | ORAL | 0 refills | Status: DC

## 2021-09-17 MED ORDER — TETANUS-DIPHTH-ACELL PERTUSSIS 5-2.5-18.5 LF-MCG/0.5 IM SUSY
0.5000 mL | PREFILLED_SYRINGE | Freq: Once | INTRAMUSCULAR | Status: AC
  Administered 2021-09-17: 0.5 mL via INTRAMUSCULAR

## 2021-09-17 MED ORDER — CYCLOBENZAPRINE HCL 5 MG PO TABS: 5.0000 mg | ORAL_TABLET | Freq: Three times a day (TID) | ORAL | 0 refills | Status: DC | PRN

## 2021-09-17 MED ORDER — AMLODIPINE BESYLATE 10 MG PO TABS: 10.0000 mg | ORAL_TABLET | Freq: Every day | ORAL | 0 refills | Status: DC

## 2021-09-17 NOTE — ED Provider Notes (Signed)
[facility]    CSN: [id number] Arrival date & time: 09/17/21  2458      History   Chief Complaint Chief Complaint  Patient presents with   Ear Pain    HPI [name] is a 56 y.o. female.   Presenting today with numerous complaints.  She states that she is requesting an updated tetanus shot as her last known tetanus shot was in 2012 and she scraped her left wrist on a rusty nail several days ago.  She states she has been cleaning the area well and it is healing without difficulty.  She is also having several days of back spasms in her bilateral lower back with no known injury.  She states movement is painful and she feels stiff but she has no radiation down her legs, numbness, tingling, weakness, bowel or bladder incontinence, saddle paresthesia.  Has not been taking anything over-the-counter for this thus far.  She is also having several days of ear pressure, popping, pain and fullness.  She states her neck is now sore in this area so she is concerned for an ear infection.  Denies fever, chills, cough, congestion, facial pain or pressure.  Not trying anything for the symptoms.  She also states that she is needing a refill on her blood pressure medications, amlodipine and losartan hydrochlorothiazide.  She is not able to get in with a primary care provider at this moment and has been out for about 4 days.  She states her blood pressures are high normal fairly consistently when she is taking the medications.  Denies chest pain, headaches, dizziness, visual changes.  Past Medical History:  Diagnosis Date   Anxiety    Arthritis    "pretty much all over; mainly neck and back" (12/26/2015)   Asthma    Bipolar 1 disorder ([place])    Bipolar disorder ([place])    Cervical cancer ([place])    "stage I"   Chronic back pain    Chronic bronchitis (HCC)    COPD (chronic obstructive pulmonary disease) (HCC)    Degenerative disc disease    Depression    Dysrhythmia    Fibromyalgia    GERD  (gastroesophageal reflux disease)    Hepatic cyst    High cholesterol    History of hiatal hernia    Hypertension 08/04/2012   IBS (irritable bowel syndrome) Diagnosed November 2012   Incisional hernia with gangrene and obstruction 12/26/2015   Melanoma of back ([place])    Ovarian cancer ([place])    "stage II"   Plantar fasciitis, bilateral    Sleep apnea    cpap coming  "soon" (12/26/2015)    Patient Active Problem List   Diagnosis Date Noted   Chronic pain syndrome 08/29/2019   Lumbar facet arthropathy (L4/5 and L5/S1) 08/29/2019   Non-recurrent acute serous otitis media of left ear 08/04/2019   Chronic pain of both shoulders 06/02/2019   Primary osteoarthritis of left shoulder 06/02/2019   H/O repair of left rotator cuff 06/02/2019   Acute pansinusitis 05/03/2019   Vitamin D deficiency 04/20/2019   Colon cancer screening 02/15/2018   Bipolar 1 disorder, mixed, moderate ([place]) 12/25/2017   Prediabetes 10/01/2017   Coronary artery disease 10/01/2017   Hepatic cyst 08/18/2017   Abdominal pain 08/18/2017   Fibromyalgia 04/16/2017   Sacroiliac joint pain 04/16/2017   Incisional hernia with gangrene and obstruction 12/26/2015   Incisional hernia with obstruction 12/26/2015   OSA (obstructive sleep apnea) 12/13/2015   Irritable bowel syndrome 09/25/2015  Risk for falls 09/25/2015   Insomnia 08/14/2015   Hypertensive urgency 03/19/2015   Unsteady gait 03/19/2015   Bipolar I disorder, most recent episode depressed ([place]) 07/04/2014   PTSD (post-traumatic stress disorder) 01/31/2014   Chronic right-sided low back pain with right-sided sciatica 08/18/2013   DDD (degenerative disc disease), cervical 08/18/2013   DDD (degenerative disc disease), lumbosacral 08/18/2013   Myalgia and myositis 08/18/2013   Osteoarthritis 09/14/2012   Asthma 09/14/2012   Chronic obstructive pulmonary disease ([place]) 09/14/2012   Gastro-esophageal reflux disease without esophagitis 09/14/2012   Essential  hypertension 09/14/2012   Tobacco use 09/14/2012   Insomnia due to mental disorder 05/28/2012    Past Surgical History:  Procedure Laterality Date   ABDOMINAL HYSTERECTOMY  1997   APPENDECTOMY     CARDIAC CATHETERIZATION  2015   CARPAL TUNNEL RELEASE Right    [place]   COLONOSCOPY  November 2012   EYE SURGERY     CATARACTS REMOVED 09/09/17 and 09/16/17   HERNIA REPAIR     INCISIONAL HERNIA REPAIR N/A 12/26/2015   Procedure: LAPAROSCOPIC REPAIR INCISIONAL HERNIA WITH MESH;  Surgeon: Fanny Skates, MD;  Location: [facility];  Service: General;  Laterality: N/A;   INSERTION OF MESH N/A 12/26/2015   Procedure: INSERTION OF MESH;  Surgeon: Fanny Skates, MD;  Location: [facility];  Service: General;  Laterality: N/A;   [place] / UMBILICAL / [place]  12/26/2015   repair of incarcerated incisional hernia with mesh   LIVER CYST REMOVAL  2014   MELANOMA EXCISION  January 2013   removed from back   SHOULDER SURGERY Left 2010   Humeral Head Microfracture    TUBAL LIGATION     UPPER GASTROINTESTINAL ENDOSCOPY  November 2012    OB History     Gravida  6   Para  2   Term  1   Preterm  1   AB  4   Living  2      SAB  4   IAB      Ectopic      Multiple      Live Births  2            Home Medications    Prior to Admission medications   Medication Sig Start Date End Date Taking? Authorizing Provider  albuterol (VENTOLIN HFA) 108 (90 Base) MCG/ACT inhaler Inhale 2 puffs into the lungs every 6 (six) hours as needed. 08/16/20   [provider]  amLODipine (NORVASC) 10 MG tablet Take 1 tablet (10 mg total) by mouth daily. 09/17/21   Volney American, PA-C  ARIPiprazole (ABILIFY) 5 MG tablet Take 1.5 tablets (7.5 mg total) by mouth at bedtime. 04/12/21 07/11/21  Norman Clay, MD  busPIRone (BUSPAR) 5 MG tablet Take 1 tablet (5 mg total) by mouth in the  morning and at bedtime. 09/13/21 12/12/21  Norman Clay, MD  cyclobenzaprine (FLEXERIL) 5 MG tablet Take 1 tablet (5 mg total) by mouth 3 (three) times daily as needed for muscle spasms. Do not drink alcohol or drive while taking this medication.  May cause drowsiness. 09/17/21   Volney American, PA-C  EPINEPHrine 0.3 mg/0.3 mL IJ SOAJ injection Inject into the muscle. 12/27/18   [provider]  IBU 600 MG tablet Take 600 mg by mouth 3 (three) times daily. Patient not taking: Reported on 04/17/2021 10/12/20   [provider]  losartan-hydrochlorothiazide (HYZAAR) 100-25 MG tablet Take 1 tablet by mouth daily. 09/17/21   Volney American, PA-C  metoprolol tartrate (LOPRESSOR) 25 MG tablet Take 1 tablet (25 mg total) by mouth once for 1 dose. Take morning of CTA 11/26/20 11/26/20  Arnoldo Lenis, MD  Multiple Vitamin (MULTIVITAMIN) capsule Take 1 capsule by mouth daily.    [provider]  potassium chloride SA (KLOR-CON) 20 MEQ tablet TAKE 2 TABLETS DAILY FOR 3 DAYS Patient not taking: Reported on 04/17/2021 11/30/20   Arnoldo Lenis, MD  rosuvastatin (CRESTOR) 10 MG tablet Take 1 tablet (10 mg total) by mouth daily. Patient not taking: Reported on 04/17/2021 11/26/20   Arnoldo Lenis, MD  losartan (COZAAR) 100 MG tablet Take 1 tablet (100 mg total) by mouth daily. Patient not taking: Reported on 10/19/2019 11/04/17 12/25/19  Caren Macadam, MD    Family History Family History  Adopted: Yes  Problem Relation Age of Onset   Bipolar disorder Mother        never diagnosed but patient suspects mother had bipolar disorder.Marland KitchenMarland KitchenMarland KitchenMarland Kitchenpt was adopeted, only knew birth mother for a short period of time.   Anxiety disorder Mother    Cirrhosis Mother    Diabetes Mother    Turner syndrome Daughter    Cancer Daughter    Anxiety disorder Daughter    Bipolar disorder Daughter    Interstitial cystitis Daughter    ADD / ADHD Neg Hx    Alcohol abuse Neg Hx    Drug abuse Neg Hx     Dementia Neg Hx    Depression Neg Hx    OCD Neg Hx    Paranoid behavior Neg Hx    Schizophrenia Neg Hx    Seizures Neg Hx    Sexual abuse Neg Hx    Physical abuse Neg Hx    Colon cancer Neg Hx     Social History Social History   Tobacco Use   Smoking status: Former    Packs/day: 0.00    Years: 0.00    Pack years: 0.00    Types: Cigarettes    Quit date: 06/12/2018    Years since quitting: 3.2   Smokeless tobacco: Current  Vaping Use   Vaping Use: Every day   Start date: 04/18/2019  Substance Use Topics   Alcohol use: No    Comment: 12/26/2015 'quit in ~ 1997"   Drug use: No    Comment: 12/26/2015 "quit in ~  2010"     Allergies   Barium sulfate, Barium-containing compounds, Bee venom, Haemophilus influenzae, Influenza virus vaccine, Seldane [terfenadine], Sulfa antibiotics, Amitriptyline, Lisinopril, Lithium, Tetracyclines & related, Celebrex [celecoxib], Metformin and related, Prednisone, Trazodone and nefazodone, Zinc, Aspirin, Ativan [lorazepam], Erythromycin, and Lipitor [atorvastatin]   Review of Systems Review of Systems Per HPI  Physical Exam Triage Vital Signs ED Triage Vitals [09/17/21 0950]  Enc Vitals Group     BP (!) 164/110     Pulse Rate 92     Resp 18     Temp 98.6 F (37 C)     Temp Source Oral     SpO2 95 %     Weight 178 lb (80.7 kg)     Height 5\' 2"  (1.575 m)     Head Circumference      Peak Flow      Pain Score 8     Pain Loc      Pain Edu?      Excl. in [place]?  No data found.  Updated Vital Signs BP (!) 164/110 (BP Location: Right Arm)    Pulse 92    Temp 98.6 F (37 C) (Oral)    Resp 18    Ht 5\' 2"  (1.575 m)    Wt 178 lb (80.7 kg)    SpO2 95%    BMI 32.56 kg/m   Visual Acuity Right Eye Distance:   Left Eye Distance:   Bilateral Distance:    Right Eye Near:   Left Eye Near:    Bilateral Near:     Physical Exam Vitals and nursing note reviewed.  Constitutional:      Appearance: Normal appearance. She is not  ill-appearing.  HENT:     Head: Atraumatic.     Ears:     Comments: Bilateral middle ear effusions    Nose: Nose normal.     Mouth/Throat:     Mouth: Mucous membranes are moist.  Eyes:     Extraocular Movements: Extraocular movements intact.     Conjunctiva/sclera: Conjunctivae normal.  Cardiovascular:     Rate and Rhythm: Normal rate and regular rhythm.     Heart sounds: Normal heart sounds.  Pulmonary:     Effort: Pulmonary effort is normal. No respiratory distress.     Breath sounds: Normal breath sounds. No wheezing or rales.  Musculoskeletal:        General: Tenderness present. No swelling or signs of injury. Normal range of motion.     Cervical back: Normal range of motion and neck supple.     Comments: Mild lumbar paraspinal musculature tender to palpation Negative straight leg raise bilateral lower extremities  Skin:    General: Skin is warm and dry.     Comments: Healing small superficial skin abrasion to the left wrist, completely scabbed over with no evidence of infection  Neurological:     Mental Status: She is alert and oriented to person, place, and time.     Motor: No weakness.     Gait: Gait normal.  Psychiatric:        Mood and Affect: Mood normal.        Thought Content: Thought content normal.        Judgment: Judgment normal.     UC Treatments / Results  Labs (all labs ordered are listed, but only abnormal results are displayed) Labs Reviewed - No data to display  EKG   Radiology No results found.  Procedures Procedures (including critical care time)  Medications Ordered in UC Medications  Tdap (BOOSTRIX) injection 0.5 mL (0.5 mLs Intramuscular Given 09/17/21 1015)  dexamethasone (DECADRON) injection 10 mg (10 mg Intramuscular Given 09/17/21 1016)    Initial Impression / Assessment and Plan / UC Course  I have reviewed the triage vital signs and the nursing notes.  Pertinent labs & imaging results that were available during my care of the  patient were reviewed by me and considered in my medical decision making (see chart for details).      Blood pressure elevated today but secondary to being out of her medications for hypertension.  We will refill both medications and have her monitor blood pressures closely at home.  Discussed DASH diet, exercise and close PCP follow-up.  Tdap updated today as she was due and had a recent skin injury.  Flexeril sent for back spasms, no evidence of bony injury in the spine today so x-ray imaging deferred with shared decision making.  IM Decadron given for eustachian tube dysfunction and  discussed Flonase, Coricidin additionally for support.  Return for any acutely worsening symptoms.  Final Clinical Impressions(s) / UC Diagnoses   Final diagnoses:  Skin abrasion  Back spasm  Middle ear effusion, bilateral  Elevated blood pressure reading   Discharge Instructions   None    ED Prescriptions     Medication Sig Dispense Auth. Provider   cyclobenzaprine (FLEXERIL) 5 MG tablet Take 1 tablet (5 mg total) by mouth 3 (three) times daily as needed for muscle spasms. Do not drink alcohol or drive while taking this medication.  May cause drowsiness. 15 tablet Volney American, [place]   amLODipine (NORVASC) 10 MG tablet Take 1 tablet (10 mg total) by mouth daily. 90 tablet Merrie Roof [place], [place]   losartan-hydrochlorothiazide (HYZAAR) 100-25 MG tablet Take 1 tablet by mouth daily. 90 tablet Volney American, [place]      PDMP not reviewed this encounter.   Volney American, [place] 09/17/21 1018

## 2021-09-17 NOTE — ED Triage Notes (Addendum)
Pt reports bilateral ear pain extending to neck x3 days, "scraped" left wrist on rusty nail on Thursday, and reports lower back and it has been "seizing up" since Sunday.  Pt also reports needs amlodipine and losartan prescriptions refilled.

## 2021-09-18 ENCOUNTER — Telehealth: Payer: Self-pay | Admitting: Emergency Medicine

## 2021-09-18 NOTE — Telephone Encounter (Signed)
Pt reports had allergic reaction consisting of nausea, diarrhea, muscle weakness, and hyperglycemia. Pt allergies updated. Pt contacted PCP as well and notified of reaction.

## 2021-09-20 ENCOUNTER — Telehealth: Payer: Self-pay | Admitting: Psychiatry

## 2021-09-20 NOTE — Telephone Encounter (Signed)
Patient stated that she called to cancel appt on 2/20, changed from no show to cancel

## 2022-03-19 LAB — HM PAP SMEAR

## 2022-04-09 ENCOUNTER — Other Ambulatory Visit: Payer: Self-pay | Admitting: Internal Medicine

## 2022-04-09 ENCOUNTER — Ambulatory Visit (INDEPENDENT_AMBULATORY_CARE_PROVIDER_SITE_OTHER): Payer: Medicare Other | Admitting: Internal Medicine

## 2022-04-09 ENCOUNTER — Encounter: Payer: Self-pay | Admitting: Internal Medicine

## 2022-04-09 VITALS — BP 138/86 | HR 108 | Ht 63.75 in | Wt 179.2 lb

## 2022-04-09 DIAGNOSIS — R87620 Atypical squamous cells of undetermined significance on cytologic smear of vagina (ASC-US): Secondary | ICD-10-CM

## 2022-04-09 DIAGNOSIS — J45909 Unspecified asthma, uncomplicated: Secondary | ICD-10-CM

## 2022-04-09 DIAGNOSIS — Z6833 Body mass index (BMI) 33.0-33.9, adult: Secondary | ICD-10-CM

## 2022-04-09 DIAGNOSIS — E782 Mixed hyperlipidemia: Secondary | ICD-10-CM

## 2022-04-09 DIAGNOSIS — Z1231 Encounter for screening mammogram for malignant neoplasm of breast: Secondary | ICD-10-CM

## 2022-04-09 DIAGNOSIS — M5137 Other intervertebral disc degeneration, lumbosacral region: Secondary | ICD-10-CM

## 2022-04-09 DIAGNOSIS — Z1211 Encounter for screening for malignant neoplasm of colon: Secondary | ICD-10-CM | POA: Diagnosis not present

## 2022-04-09 DIAGNOSIS — H66002 Acute suppurative otitis media without spontaneous rupture of ear drum, left ear: Secondary | ICD-10-CM

## 2022-04-09 DIAGNOSIS — Z8541 Personal history of malignant neoplasm of cervix uteri: Secondary | ICD-10-CM

## 2022-04-09 DIAGNOSIS — Z0000 Encounter for general adult medical examination without abnormal findings: Secondary | ICD-10-CM

## 2022-04-09 DIAGNOSIS — H6502 Acute serous otitis media, left ear: Secondary | ICD-10-CM

## 2022-04-09 DIAGNOSIS — M51379 Other intervertebral disc degeneration, lumbosacral region without mention of lumbar back pain or lower extremity pain: Secondary | ICD-10-CM

## 2022-04-09 DIAGNOSIS — Z2821 Immunization not carried out because of patient refusal: Secondary | ICD-10-CM | POA: Diagnosis not present

## 2022-04-09 DIAGNOSIS — I10 Essential (primary) hypertension: Secondary | ICD-10-CM

## 2022-04-09 DIAGNOSIS — Z8543 Personal history of malignant neoplasm of ovary: Secondary | ICD-10-CM

## 2022-04-09 DIAGNOSIS — R7303 Prediabetes: Secondary | ICD-10-CM

## 2022-04-09 MED ORDER — OFLOXACIN 0.3 % OT SOLN: 5.0000 [drp] | Freq: Every day | OTIC | 0 refills | Status: AC

## 2022-04-09 MED ORDER — EPINEPHRINE 0.3 MG/0.3ML IJ SOAJ: 0.3000 mg | INTRAMUSCULAR | 1 refills | Status: AC | PRN

## 2022-04-09 NOTE — Patient Instructions (Signed)
It was a pleasure to see you today.  Thank you for giving [place] the opportunity to be involved in your care.  Below is a brief recap of your visit and next steps.  We will plan to see you again in 1 month  Summary We will check basic labs today I have referred you to OBGYN I have ordered cologuard for colorectal cancer screening Mammogram ordered today  Next steps Follow up in 1 month I will notify you of lab results
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
## 2022-04-10 ENCOUNTER — Telehealth: Payer: Self-pay | Admitting: Internal Medicine

## 2022-04-10 LAB — CBC WITH DIFFERENTIAL/PLATELET
Basophils Absolute: 0.1 10*3/uL (ref 0.0–0.2)
Basos: 1 %
EOS (ABSOLUTE): 0.3 10*3/uL (ref 0.0–0.4)
Eos: 3 %
Hematocrit: 46.2 % (ref 34.0–46.6)
Hemoglobin: 15.2 g/dL (ref 11.1–15.9)
Immature Grans (Abs): 0.1 10*3/uL (ref 0.0–0.1)
Immature Granulocytes: 1 %
Lymphocytes Absolute: 2.7 10*3/uL (ref 0.7–3.1)
Lymphs: 25 %
MCH: 28.3 pg (ref 26.6–33.0)
MCHC: 32.9 g/dL (ref 31.5–35.7)
MCV: 86 fL (ref 79–97)
Monocytes Absolute: 0.8 10*3/uL (ref 0.1–0.9)
Monocytes: 8 %
Neutrophils Absolute: 6.7 10*3/uL (ref 1.4–7.0)
Neutrophils: 62 %
Platelets: 358 10*3/uL (ref 150–450)
RBC: 5.38 x10E6/uL — ABNORMAL HIGH (ref 3.77–5.28)
RDW: 13.2 % (ref 11.7–15.4)
WBC: 10.6 10*3/uL (ref 3.4–10.8)

## 2022-04-10 LAB — CMP14+EGFR
ALT: 34 IU/L — ABNORMAL HIGH (ref 0–32)
AST: 28 IU/L (ref 0–40)
Albumin/Globulin Ratio: 1.8 (ref 1.2–2.2)
Albumin: 4.9 g/dL (ref 3.8–4.9)
Alkaline Phosphatase: 90 IU/L (ref 44–121)
BUN/Creatinine Ratio: 13 (ref 9–23)
BUN: 11 mg/dL (ref 6–24)
Bilirubin Total: 0.7 mg/dL (ref 0.0–1.2)
CO2: 21 mmol/L (ref 20–29)
Calcium: 10.3 mg/dL — ABNORMAL HIGH (ref 8.7–10.2)
Chloride: 101 mmol/L (ref 96–106)
Creatinine, Ser: 0.82 mg/dL (ref 0.57–1.00)
Globulin, Total: 2.8 g/dL (ref 1.5–4.5)
Glucose: 94 mg/dL (ref 70–99)
Potassium: 4.2 mmol/L (ref 3.5–5.2)
Sodium: 142 mmol/L (ref 134–144)
Total Protein: 7.7 g/dL (ref 6.0–8.5)
eGFR: 83 mL/min/{1.73_m2} (ref >59)

## 2022-04-10 LAB — TSH+FREE T4
Free T4: 1.16 ng/dL (ref 0.82–1.77)
TSH: 1.11 u[IU]/mL (ref 0.450–4.500)

## 2022-04-10 LAB — B12 AND FOLATE PANEL
Folate: 6.6 ng/mL (ref >3.0)
Vitamin B-12: 419 pg/mL (ref 232–1245)

## 2022-04-10 LAB — LIPID PANEL
Chol/HDL Ratio: 5.5 ratio — ABNORMAL HIGH (ref 0.0–4.4)
Cholesterol, Total: 229 mg/dL — ABNORMAL HIGH (ref 100–199)
HDL: 42 mg/dL (ref >39)
LDL Chol Calc (NIH): 150 mg/dL — ABNORMAL HIGH (ref 0–99)
Triglycerides: 201 mg/dL — ABNORMAL HIGH (ref 0–149)
VLDL Cholesterol Cal: 37 mg/dL (ref 5–40)

## 2022-04-10 LAB — HEMOGLOBIN A1C
Est. average glucose Bld gHb Est-mCnc: 117 mg/dL
Hgb A1c MFr Bld: 5.7 % — ABNORMAL HIGH (ref 4.8–5.6)

## 2022-04-10 LAB — VITAMIN D 25 HYDROXY (VIT D DEFICIENCY, FRACTURES): Vit D, 25-Hydroxy: 25 ng/mL — ABNORMAL LOW (ref 30.0–100.0)

## 2022-04-10 NOTE — Telephone Encounter (Signed)
Returned patient call.

## 2022-04-10 NOTE — Telephone Encounter (Signed)
Patient wants a call back in regard to lab results  

## 2022-04-11 ENCOUNTER — Other Ambulatory Visit: Payer: Self-pay | Admitting: Internal Medicine

## 2022-04-11 DIAGNOSIS — E782 Mixed hyperlipidemia: Secondary | ICD-10-CM

## 2022-04-11 DIAGNOSIS — R87620 Atypical squamous cells of undetermined significance on cytologic smear of vagina (ASC-US): Secondary | ICD-10-CM

## 2022-04-11 DIAGNOSIS — Z6833 Body mass index (BMI) 33.0-33.9, adult: Secondary | ICD-10-CM | POA: Insufficient documentation

## 2022-04-11 DIAGNOSIS — E785 Hyperlipidemia, unspecified: Secondary | ICD-10-CM | POA: Insufficient documentation

## 2022-04-11 DIAGNOSIS — Z0000 Encounter for general adult medical examination without abnormal findings: Secondary | ICD-10-CM | POA: Insufficient documentation

## 2022-04-11 HISTORY — DX: Atypical squamous cells of undetermined significance on cytologic smear of vagina (ASC-US): R87.620

## 2022-04-11 HISTORY — DX: Encounter for general adult medical examination without abnormal findings: Z00.00

## 2022-04-11 MED ORDER — ROSUVASTATIN CALCIUM 10 MG PO TABS: 10.0000 mg | ORAL_TABLET | Freq: Every day | ORAL | 3 refills | Status: DC

## 2022-04-11 NOTE — Assessment & Plan Note (Signed)
Her past medical history is significant for ovarian and cervical cancer s/p TAH in 1997.  She recently underwent Pap smear that demonstrated ASCUS in late August.  Today she requested a referral to a different OB/GYN within [facility] for evaluation. -OB/GYN referral placed

## 2022-04-11 NOTE — Assessment & Plan Note (Signed)
She endorses left ear pain.  Erythema is present around the TM on exam. -Ofloxacin otic drops x7 days prescribed

## 2022-04-11 NOTE — Assessment & Plan Note (Signed)
Initial BP 160/82, improved to 138/86 on repeat.  Her current regimen consist of amlodipine 10 mg daily and losartan-HCTZ 100-25 mg daily. -Follow-up in 1 month for BP check -No changes today

## 2022-04-11 NOTE — Assessment & Plan Note (Signed)
Previously documented history of prediabetes.  Not currently on any medications. -Repeat A1c today

## 2022-04-11 NOTE — Assessment & Plan Note (Signed)
Presenting today to establish care -Based labs ordered -States that she is allergic to the influenza vaccine -Declined COVID-19 vaccine -Agreeable to Cologuard -Referred for screening mammogram -States that she has previously received the first Shingrix vaccine and is due for the second vaccine next month.  She will bring records to her next appointment.

## 2022-04-11 NOTE — Assessment & Plan Note (Addendum)
Reports that she currently uses hemp to manage her chronic pain.  She is interested in establishing care with a new pain management group. -I have requested records from her prior pain management physician -Follow-up in 1 month to review records and further discuss

## 2022-04-11 NOTE — Assessment & Plan Note (Signed)
Counseled on appropriate diet and lifestyle recommendations including the national recommendation for 150 minutes of moderate intensity exercise weekly.  We also reviewed the Mediterranean diet.

## 2022-04-11 NOTE — Assessment & Plan Note (Signed)
Previously prescribed Crestor 10 mg daily but states that she has not been taking the medication recently. -Repeat lipid panel today -Encouraged her to resume taking Crestor

## 2022-04-11 NOTE — Assessment & Plan Note (Signed)
Asymptomatic today.  Pulmonary exam unremarkable.  Has an albuterol inhaler available for as needed use.

## 2022-04-21 ENCOUNTER — Other Ambulatory Visit: Payer: Self-pay

## 2022-04-21 ENCOUNTER — Encounter: Payer: Self-pay | Admitting: Internal Medicine

## 2022-04-21 DIAGNOSIS — Z8543 Personal history of malignant neoplasm of ovary: Secondary | ICD-10-CM

## 2022-04-21 DIAGNOSIS — Z8541 Personal history of malignant neoplasm of cervix uteri: Secondary | ICD-10-CM

## 2022-04-21 DIAGNOSIS — R87620 Atypical squamous cells of undetermined significance on cytologic smear of vagina (ASC-US): Secondary | ICD-10-CM

## 2022-04-24 ENCOUNTER — Encounter: Payer: Self-pay | Admitting: Nurse Practitioner

## 2022-04-24 ENCOUNTER — Ambulatory Visit (INDEPENDENT_AMBULATORY_CARE_PROVIDER_SITE_OTHER): Payer: Medicare Other | Admitting: Nurse Practitioner

## 2022-04-24 DIAGNOSIS — Z122 Encounter for screening for malignant neoplasm of respiratory organs: Secondary | ICD-10-CM

## 2022-04-24 DIAGNOSIS — Z0000 Encounter for general adult medical examination without abnormal findings: Secondary | ICD-10-CM

## 2022-04-24 DIAGNOSIS — F3162 Bipolar disorder, current episode mixed, moderate: Secondary | ICD-10-CM

## 2022-04-24 NOTE — Patient Instructions (Addendum)
  [name] , Thank you for taking time to come for your Medicare Wellness Visit. I appreciate your ongoing commitment to your health goals. Please review the following plan we discussed and let me know if I can assist you in the future.   These are the goals we discussed:  Goals      Increase physical activity     Engage in regular exercises at least 30 minutes 5 day a week as tolerated         This is a list of the screening recommended for you and due dates:  Health Maintenance  Topic Date Due   Zoster (Shingles) Vaccine (1 of 2) Never done   Mammogram  02/26/2019   Colon Cancer Screening  06/09/2021   Pap Smear  03/19/2025   Tetanus Vaccine  09/18/2031   Hepatitis C Screening: USPSTF Recommendation to screen - Ages 18-79 yo.  Completed   HIV Screening  Completed   HPV Vaccine  Aged Out   Flu Shot  Discontinued   COVID-19 Vaccine  Discontinued
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
## 2022-04-25 NOTE — Addendum Note (Signed)
Addended by: Eual Fines on: 04/25/2022 01:31 PM   Modules accepted: Orders, Level of Service

## 2022-04-28 ENCOUNTER — Ambulatory Visit (HOSPITAL_COMMUNITY)
Admission: RE | Admit: 2022-04-28 | Discharge: 2022-04-28 | Disposition: A | Discharge status: Home / Self Care | Payer: Medicare Other | Source: Ambulatory Visit | Attending: Internal Medicine | Admitting: Internal Medicine

## 2022-04-28 DIAGNOSIS — Z1231 Encounter for screening mammogram for malignant neoplasm of breast: Secondary | ICD-10-CM | POA: Diagnosis present

## 2022-04-28 DIAGNOSIS — Z0000 Encounter for general adult medical examination without abnormal findings: Secondary | ICD-10-CM | POA: Insufficient documentation

## 2022-04-30 LAB — COLOGUARD: COLOGUARD: NEGATIVE

## 2022-05-07 ENCOUNTER — Encounter: Payer: Self-pay | Admitting: Internal Medicine

## 2022-05-07 ENCOUNTER — Ambulatory Visit (INDEPENDENT_AMBULATORY_CARE_PROVIDER_SITE_OTHER): Payer: Medicare Other | Admitting: Internal Medicine

## 2022-05-07 VITALS — BP 155/78 | HR 102 | Ht 61.0 in | Wt 178.6 lb

## 2022-05-07 DIAGNOSIS — R300 Dysuria: Secondary | ICD-10-CM | POA: Diagnosis not present

## 2022-05-07 DIAGNOSIS — R109 Unspecified abdominal pain: Secondary | ICD-10-CM

## 2022-05-07 DIAGNOSIS — M5137 Other intervertebral disc degeneration, lumbosacral region: Secondary | ICD-10-CM

## 2022-05-07 DIAGNOSIS — R1031 Right lower quadrant pain: Secondary | ICD-10-CM

## 2022-05-07 DIAGNOSIS — M5441 Lumbago with sciatica, right side: Secondary | ICD-10-CM

## 2022-05-07 DIAGNOSIS — R87620 Atypical squamous cells of undetermined significance on cytologic smear of vagina (ASC-US): Secondary | ICD-10-CM

## 2022-05-07 DIAGNOSIS — G894 Chronic pain syndrome: Secondary | ICD-10-CM

## 2022-05-07 DIAGNOSIS — G8929 Other chronic pain: Secondary | ICD-10-CM

## 2022-05-07 LAB — POCT URINALYSIS DIP (CLINITEK)
Bilirubin, UA: NEGATIVE
Blood, UA: NEGATIVE
Glucose, UA: NEGATIVE mg/dL
Ketones, POC UA: NEGATIVE mg/dL
Leukocytes, UA: NEGATIVE
Nitrite, UA: NEGATIVE
POC PROTEIN,UA: NEGATIVE
Spec Grav, UA: 1.015 (ref 1.010–1.025)
Urobilinogen, UA: 0.2 E.U./dL
pH, UA: 7 (ref 5.0–8.0)

## 2022-05-07 MED ORDER — TIZANIDINE HCL 4 MG PO TABS: 4.0000 mg | ORAL_TABLET | Freq: Four times a day (QID) | ORAL | 0 refills | Status: DC | PRN

## 2022-05-07 MED ORDER — KETOROLAC TROMETHAMINE 60 MG/2ML IM SOLN
60.0000 mg | Freq: Once | INTRAMUSCULAR | Status: AC
  Administered 2022-05-07: 60 mg via INTRAMUSCULAR

## 2022-05-07 NOTE — Assessment & Plan Note (Signed)
Previously referred to OB/GYN for ASCUS demonstrated on her Pap smear from late August.  She has a history of ovarian and cervical cancer s/p TAH in 1997.  Her history is also remarkable for lichen sclerosis. -We will follow-up on the status of this referral today

## 2022-05-07 NOTE — Assessment & Plan Note (Signed)
She endorses chronic low back pain today.  She does not want to take narcotics but requests a refill of Zanaflex today.  She states that she was previously prescribed this medication and it was effective in improving her pain.  No red flag symptoms present today. -Zanaflex refilled today -Follow-up in 2 weeks for reassessment

## 2022-05-07 NOTE — Progress Notes (Signed)
Established Patient Office Visit  Subjective   Patient ID: [name], female    DOB: 04-12-1966  Age: 56 y.o. MRN: [id number]  Chief Complaint  Patient presents with   Follow-up    Pain on right side below rib cage down into hip. Started on 05/04/2022.    [name] returns to care today.  [name] is a 56 year old woman with a past medical history significant for HTN, [name], prediabetes, asthma, chronic pain syndrome, [name]  [name] was last seen by me on 9/13 to establish care.  Basic labs ordered.  Ofloxacin otic drops prescribed on 7 days [name] was also referred to OB/GYN for further evaluation in the setting of ASCUS on Pap smear from late August.  Today [name] endorses significant right flank pain.  [name] states this been going on since Sunday.  [name] endorses significant pain with urination.  Her urine has become dark.  [name] endorses chills but has not checked her temperature.  [name] is concerned that [name] may have a kidney stone as [name] has a prior history of kidney stones.  [name] has endorses chronic lumbar back pain today and request a new prescription for Zanaflex.  [name] states that this has previously been effective for her and [name] does not want to take any narcotics.  [name] also states that [name] has not heard heard from OB/GYN about scheduling an appointment.  Acute concerns, chronic medical conditions, and outstanding preventative healthcare maintenance items discussed today individually addressed in A/P below.  Past Medical History:  Diagnosis Date   Anxiety    Arthritis    "pretty much all over; mainly neck and back" (12/26/2015)   Asthma    Bipolar 1 disorder ([place])    Bipolar disorder ([place])    Cervical cancer ([place])    "stage I"   Chronic back pain    Chronic bronchitis (HCC)    COPD (chronic obstructive pulmonary disease) (HCC)    Degenerative disc disease    Depression    Dysrhythmia    Fibromyalgia    GERD  (gastroesophageal reflux disease)    Hepatic cyst    High cholesterol    History of hiatal hernia    Hypertension 08/04/2012   IBS (irritable bowel syndrome) Diagnosed November 2012   Incisional hernia with gangrene and obstruction 12/26/2015   Melanoma of back ([place])    Ovarian cancer ([place])    "stage II"   Plantar fasciitis, bilateral    Sleep apnea    cpap coming  "soon" (12/26/2015)   Past Surgical History:  Procedure Laterality Date   [place]  2015   CARPAL TUNNEL RELEASE Right    [place]   COLONOSCOPY  November 2012   EYE SURGERY     CATARACTS REMOVED 09/09/17 and 09/16/17   HERNIA REPAIR     INCISIONAL HERNIA REPAIR N/A 12/26/2015   Procedure: LAPAROSCOPIC REPAIR INCISIONAL HERNIA WITH MESH;  Surgeon: Fanny Skates, MD;  Location: [facility];  Service: General;  Laterality: N/A;   INSERTION OF MESH N/A 12/26/2015   Procedure: INSERTION OF MESH;  Surgeon: Fanny Skates, MD;  Location: [facility];  Service: General;  Laterality: N/A;   [place] / UMBILICAL / [place]  12/26/2015   repair of incarcerated incisional hernia with mesh  LIVER CYST REMOVAL  2014   MELANOMA EXCISION  January 2013   removed from back   SHOULDER SURGERY Left 2010   Humeral Head Microfracture    TUBAL LIGATION     UPPER GASTROINTESTINAL ENDOSCOPY  November 2012   Social History   Tobacco Use   Smoking status: Former    Packs/day: 0.00    Years: 0.00    Total pack years: 0.00    Types: Cigarettes    Quit date: 06/12/2018    Years since quitting: 3.9   Smokeless tobacco: Current   Tobacco comments:    Smokes hemp every night to help with her pain  Vaping Use   Vaping Use: Every day   Start date: 04/18/2019  Substance Use Topics   Alcohol use: No    Comment: 12/26/2015 'quit in ~ 1997"   Drug use: Yes    Types: Other-see  comments    Comment: 12/26/2015 "quit in ~  2010".   smokes CBD nightly   Family History  Adopted: Yes  Problem Relation Age of Onset   Bipolar disorder Mother        never diagnosed but patient suspects mother had bipolar disorder.Marland KitchenMarland KitchenMarland KitchenMarland Kitchenpt was adopeted, only knew birth mother for a short period of time.   Anxiety disorder Mother    Cirrhosis Mother    Diabetes Mother    Turner syndrome Daughter    Cancer Daughter    Anxiety disorder Daughter    Bipolar disorder Daughter    Interstitial cystitis Daughter    ADD / ADHD Neg Hx    Alcohol abuse Neg Hx    Drug abuse Neg Hx    Dementia Neg Hx    Depression Neg Hx    OCD Neg Hx    Paranoid behavior Neg Hx    Schizophrenia Neg Hx    Seizures Neg Hx    Sexual abuse Neg Hx    Physical abuse Neg Hx    Colon cancer Neg Hx    Allergies  Allergen Reactions   Barium Sulfate Nausea And Vomiting    Stomach cramps, extreme diarrhea, and vomiting   Barium-Containing Compounds Other (See Comments)    Stomach cramps, extreme diarrhea, and vomiting   Bee Venom Anaphylaxis   Haemophilus Influenzae Swelling    Trouble swallowing   Influenza Virus Vaccine Swelling    Trouble swallowing Trouble swallowing    Seldane [Terfenadine] Nausea And Vomiting and Other (See Comments)    CAUSES TOP LAYER OF SKIN TO PEEL   Sulfa Antibiotics Anaphylaxis   Amitriptyline     Other reaction(s): Other (See Comments) Mood instability   Lisinopril Cough   Lithium Other (See Comments)    Agitation and hostility   Tetracyclines & Related Hives and Nausea And Vomiting   Celebrex [Celecoxib] Nausea And Vomiting   Dexamethasone     Elevated blood sugar,nausea,diarrhea, muscle weakness   Metformin And Related Nausea And Vomiting   Prednisone     Other reaction(s): Generalized Edema (intolerance)   Trazodone And Nefazodone     Sick   Zinc Nausea And Vomiting   Aspirin Rash and Other (See Comments)    Upset stomach   Ativan [Lorazepam] Other (See Comments)     Irritability   Erythromycin Nausea And Vomiting, Rash and Other (See Comments)    Cramps   Lipitor [Atorvastatin] Nausea And Vomiting   Review of Systems  Genitourinary:  Positive for dysuria and flank pain (Right flank pain).  Musculoskeletal:  Positive for back  pain (Chronic lumbar back pain).  All other systems reviewed and are negative.    Objective:     BP (!) 155/78   Pulse (!) 102   Ht '5\' 1"'  (1.549 m)   Wt 178 lb 9.6 oz (81 kg)   SpO2 97%   BMI 33.75 kg/m  BP Readings from Last 3 Encounters:  05/07/22 (!) 155/78  04/09/22 138/86  09/17/21 (!) 164/110   Physical Exam Vitals reviewed.  Constitutional:      General: [name] is not in acute distress.    Appearance: Normal appearance. [name] is obese. [name] is not toxic-appearing.  HENT:     Head: Normocephalic and atraumatic.     Right Ear: External ear normal.     Left Ear: External ear normal.     Nose: Nose normal. No congestion or rhinorrhea.     Mouth/Throat:     Mouth: Mucous membranes are moist.     Pharynx: Oropharynx is clear. No oropharyngeal exudate or posterior oropharyngeal erythema.  Eyes:     General: No scleral icterus.    Extraocular Movements: Extraocular movements intact.     Conjunctiva/sclera: Conjunctivae normal.     Pupils: Pupils are equal, round, and reactive to light.  Cardiovascular:     Rate and Rhythm: Normal rate and regular rhythm.     Pulses: Normal pulses.     Heart sounds: Normal heart sounds. No murmur heard.    No friction rub. No gallop.  Pulmonary:     Effort: Pulmonary effort is normal. No respiratory distress.     Breath sounds: Normal breath sounds. No wheezing.  Abdominal:     General: Abdomen is flat. Bowel sounds are normal. There is no distension.     Palpations: Abdomen is soft.     Tenderness: There is no abdominal tenderness. There is right CVA tenderness (Exquisitely tender to palpation).  Musculoskeletal:     Cervical back: Normal range of motion.   Lymphadenopathy:     Cervical: No cervical adenopathy.  Skin:    General: Skin is warm and dry.     Capillary Refill: Capillary refill takes less than 2 seconds.     Coloration: Skin is not jaundiced.  Neurological:     General: No focal deficit present.     Mental Status: [name] is alert and oriented to person, place, and time.  Psychiatric:        Mood and Affect: Mood normal.        Behavior: Behavior normal.    Last CBC Lab Results  Component Value Date   WBC 10.6 04/09/2022   HGB 15.2 04/09/2022   HCT 46.2 04/09/2022   MCV 86 04/09/2022   MCH 28.3 04/09/2022   RDW 13.2 04/09/2022   PLT 358 29/92/4268   Last metabolic panel Lab Results  Component Value Date   GLUCOSE 94 04/09/2022   NA 142 04/09/2022   K 4.2 04/09/2022   CL 101 04/09/2022   CO2 21 04/09/2022   BUN 11 04/09/2022   CREATININE 0.82 04/09/2022   EGFR 83 04/09/2022   CALCIUM 10.3 (H) 04/09/2022   PROT 7.7 04/09/2022   ALBUMIN 4.9 04/09/2022   LABGLOB 2.8 04/09/2022   AGRATIO 1.8 04/09/2022   BILITOT 0.7 04/09/2022   ALKPHOS 90 04/09/2022   AST 28 04/09/2022   ALT 34 (H) 04/09/2022   ANIONGAP 8 12/08/2017   Last lipids Lab Results  Component Value Date   CHOL 229 (H) 04/09/2022   HDL 42 04/09/2022  LDLCALC 150 (H) 04/09/2022   TRIG 201 (H) 04/09/2022   CHOLHDL 5.5 (H) 04/09/2022   Last hemoglobin A1c Lab Results  Component Value Date   HGBA1C 5.7 (H) 04/09/2022   Last thyroid functions Lab Results  Component Value Date   TSH 1.110 04/09/2022   T3TOTAL 138.6 02/08/2014   Last vitamin D Lab Results  Component Value Date   VD25OH 25.0 (L) 04/09/2022   Last vitamin B12 and Folate Lab Results  Component Value Date   VITAMINB12 419 04/09/2022   FOLATE 6.6 04/09/2022   The 10-year ASCVD risk score (Arnett DK, et al., 2019) is: 15%    Assessment & Plan:   Problem List Items Addressed This Visit       Chronic pain syndrome    [name] endorses chronic low back pain today.  [name]  does not want to take narcotics but requests a refill of Zanaflex today.  [name] states that [name] was previously prescribed this medication and it was effective in improving her pain.  No red flag symptoms present today. -Zanaflex refilled today -Follow-up in 2 weeks for reassessment      Atypical squamous cell changes of undetermined significance (ASCUS) on vaginal cytology    Previously referred to OB/GYN for ASCUS demonstrated on her Pap smear from late August.  [name] has a history of ovarian and cervical cancer s/p TAH in 1997.  Her history is also remarkable for lichen sclerosis. -We will follow-up on the status of this referral today      Right flank pain - Primary    [name] reports onset of right flank pain with associated dysuria and subjective chills beginning Sunday (10/8).  [name] has exquisite CVA tenderness on exam today UA was ordered and is not consistent with infection.  I have ordered CT renal stone protocol.  [name] will also receive a Toradol injection for pain relief. -Follow up in 2 weeks for reassessment      Relevant Orders   CT RENAL STONE STUDY   Return in about 2 weeks (around 05/21/2022).    Johnette Abraham, MD

## 2022-05-07 NOTE — Addendum Note (Signed)
Addended by: Johny Drilling on: 05/07/2022 04:13 PM   Modules accepted: Orders

## 2022-05-07 NOTE — Assessment & Plan Note (Signed)
She reports onset of right flank pain with associated dysuria and subjective chills beginning Sunday (10/8).  She has exquisite CVA tenderness on exam today UA was ordered and is not consistent with infection.  I have ordered CT renal stone protocol.  She will also receive a Toradol injection for pain relief. -Follow up in 2 weeks for reassessment

## 2022-05-07 NOTE — Patient Instructions (Signed)
It was a pleasure to see you today.  Thank you for giving [place] the opportunity to be involved in your care.  Below is a brief recap of your visit and next steps.  We will plan to see you again in 2 weeks.  Summary I have prescribed Zanaflex for back pain relief and ordered a toradol injection. We will get a CT scan to evaluate for kidney stone Follow up in 2 weeks.

## 2022-05-08 ENCOUNTER — Ambulatory Visit (HOSPITAL_COMMUNITY)
Admission: RE | Admit: 2022-05-08 | Discharge: 2022-05-08 | Disposition: A | Discharge status: Home / Self Care | Payer: Medicare Other | Source: Ambulatory Visit | Attending: Internal Medicine | Admitting: Internal Medicine

## 2022-05-08 DIAGNOSIS — R1031 Right lower quadrant pain: Secondary | ICD-10-CM | POA: Insufficient documentation

## 2022-05-08 DIAGNOSIS — R300 Dysuria: Secondary | ICD-10-CM | POA: Insufficient documentation

## 2022-05-08 DIAGNOSIS — R109 Unspecified abdominal pain: Secondary | ICD-10-CM | POA: Diagnosis present

## 2022-05-10 LAB — URINE CULTURE

## 2022-05-21 ENCOUNTER — Ambulatory Visit: Payer: Medicare Other | Admitting: Internal Medicine

## 2022-05-22 ENCOUNTER — Ambulatory Visit (INDEPENDENT_AMBULATORY_CARE_PROVIDER_SITE_OTHER): Payer: Medicare Other | Admitting: Internal Medicine

## 2022-05-22 ENCOUNTER — Encounter: Payer: Self-pay | Admitting: Internal Medicine

## 2022-05-22 VITALS — BP 176/89 | HR 83 | Ht 61.75 in | Wt 182.2 lb

## 2022-05-22 DIAGNOSIS — M5137 Other intervertebral disc degeneration, lumbosacral region: Secondary | ICD-10-CM | POA: Diagnosis not present

## 2022-05-22 DIAGNOSIS — I10 Essential (primary) hypertension: Secondary | ICD-10-CM | POA: Diagnosis not present

## 2022-05-22 DIAGNOSIS — M5441 Lumbago with sciatica, right side: Secondary | ICD-10-CM

## 2022-05-22 DIAGNOSIS — R109 Unspecified abdominal pain: Secondary | ICD-10-CM

## 2022-05-22 DIAGNOSIS — G8929 Other chronic pain: Secondary | ICD-10-CM

## 2022-05-22 MED ORDER — TIZANIDINE HCL 4 MG PO TABS: 4.0000 mg | ORAL_TABLET | Freq: Four times a day (QID) | ORAL | 0 refills | Status: DC | PRN

## 2022-05-22 MED ORDER — TIZANIDINE HCL 4 MG PO TABS: 4.0000 mg | ORAL_TABLET | Freq: Four times a day (QID) | ORAL | 1 refills | Status: DC | PRN

## 2022-05-22 MED ORDER — MELOXICAM 7.5 MG PO TABS: 7.5000 mg | ORAL_TABLET | Freq: Every day | ORAL | 0 refills | Status: AC

## 2022-05-22 NOTE — Patient Instructions (Signed)
It was a pleasure to see you today.  Thank you for giving [place] the opportunity to be involved in your care.  Below is a brief recap of your visit and next steps.  We will plan to see you again in 4 weeks.  Summary I have refilled Zanaflex and prescribed meloxicam 7.5 mg daily. Please let me know how your back pain is doing. Please see the attached exercises for back pain.  Next steps Follow up in 4 weeks

## 2022-05-22 NOTE — Progress Notes (Signed)
Established Patient Office Visit  Subjective   Patient ID: [name], female    DOB: October 01, 1965  Age: 56 y.o. MRN: [id number]  Chief Complaint  Patient presents with   Follow-up   [name] returns to care today.  [name], [name], prediabetes, asthma, chronic pain syndrome, [name]  She was last seen by me on 10/11 at which time she endorsed significant right flank pain.  UA was ordered and was not consistent with infection.  I ordered CT renal stone protocol, which was not concerning for acute intra-abdominal pathology.  She returns to care today for reevaluation.  Today [name] states that her right-sided back pain has not improved.  She endorses pain with lateral movements to the right at the waist.  She has been taking ibuprofen for pain relief.  She denies urinary symptoms, specifically denying dysuria and hematuria.  She additionally denies diarrhea/constipation or blood in her stool.  [name] additionally denies fever/chills and nausea/vomiting.  Past Medical History:  Diagnosis Date   Anxiety    Arthritis    "pretty much all over; mainly neck and back" (12/26/2015)   Asthma    Bipolar 1 disorder ([place])    Bipolar disorder ([place])    Cervical cancer ([place])    "stage I"   Chronic back pain    Chronic bronchitis (H[facility])    COPD (chronic obstructive pulmonary disease) (HCC)    Degenerative disc disease    Depression    Dysrhythmia    Fibromyalgia    GERD (gastroesophageal reflux disease)    Hepatic cyst    High cholesterol    History of hiatal hernia    Hypertension 08/04/2012   IBS (irritable bowel syndrome) Diagnosed November 2012   Incisional hernia with gangrene and obstruction 12/26/2015   Melanoma of back ([place])    Ovarian cancer ([place])    "stage II"   Plantar fasciitis, bilateral    Sleep apnea    cpap coming  "soon" (12/26/2015)   Past  Surgical History:  Procedure Laterality Date   [place]  2015   CARPAL TUNNEL RELEASE Right    [place]   COLONOSCOPY  November 2012   EYE SURGERY     CATARACTS REMOVED 09/09/17 and 09/16/17   HERNIA REPAIR     INCISIONAL HERNIA REPAIR N/A 12/26/2015   Procedure: LAPAROSCOPIC REPAIR INCISIONAL HERNIA WITH MESH;  Surgeon: Fanny Skates, MD;  Location: [facility];  Service: General;  Laterality: N/A;   INSERTION OF MESH N/A 12/26/2015   Procedure: INSERTION OF MESH;  Surgeon: Fanny Skates, MD;  Location: [facility];  Service: General;  Laterality: N/A;   [place] / UMBILICAL / [place]  12/26/2015   repair of incarcerated incisional hernia with mesh   LIVER CYST REMOVAL  2014   MELANOMA EXCISION  January 2013   removed from back   SHOULDER SURGERY Left 2010   Humeral Head Microfracture    TUBAL LIGATION     UPPER GASTROINTESTINAL ENDOSCOPY  November 2012   Social History   Tobacco Use   Smoking status: Former    Packs/day: 0.00    Years: 0.00    Total pack years: 0.00    Types: Cigarettes, E-cigarettes  Quit date: 06/12/2018    Years since quitting: 3.9   Smokeless tobacco: Current   Tobacco comments:    Smokes hemp every night to help with her pain  Vaping Use   Vaping Use: Every day   Start date: 04/18/2019  Substance Use Topics   Alcohol use: No    Comment: 12/26/2015 'quit in ~ 1997"   Drug use: Yes    Types: Other-see comments, Marijuana    Comment: 12/26/2015 "quit in ~  2010".   smokes CBD with 0.3% THC nightly   Family History  Adopted: Yes  Problem Relation Age of Onset   Bipolar disorder Mother        never diagnosed but patient suspects mother had bipolar disorder.Marland KitchenMarland KitchenMarland KitchenMarland Kitchenpt was adopeted, only knew birth mother for a short period of time.   Anxiety disorder Mother    Cirrhosis Mother     Diabetes Mother    Turner syndrome Daughter    Cancer Daughter    Anxiety disorder Daughter    Bipolar disorder Daughter    Interstitial cystitis Daughter    ADD / ADHD Neg Hx    Alcohol abuse Neg Hx    Drug abuse Neg Hx    Dementia Neg Hx    Depression Neg Hx    OCD Neg Hx    Paranoid behavior Neg Hx    Schizophrenia Neg Hx    Seizures Neg Hx    Sexual abuse Neg Hx    Physical abuse Neg Hx    Colon cancer Neg Hx    Allergies  Allergen Reactions   Barium Sulfate Nausea And Vomiting    Stomach cramps, extreme diarrhea, and vomiting   Barium-Containing Compounds Other (See Comments)    Stomach cramps, extreme diarrhea, and vomiting   Bee Venom Anaphylaxis   Haemophilus Influenzae Swelling    Trouble swallowing   Influenza Virus Vaccine Swelling    Trouble swallowing Trouble swallowing    Seldane [Terfenadine] Nausea And Vomiting and Other (See Comments)    CAUSES TOP LAYER OF SKIN TO PEEL   Sulfa Antibiotics Anaphylaxis   Amitriptyline     Other reaction(s): Other (See Comments) Mood instability   Lisinopril Cough   Lithium Other (See Comments)    Agitation and hostility   Tetracyclines & Related Hives and Nausea And Vomiting   Celebrex [Celecoxib] Nausea And Vomiting   Dexamethasone     Elevated blood sugar,nausea,diarrhea, muscle weakness   Metformin And Related Nausea And Vomiting   Prednisone     Other reaction(s): Generalized Edema (intolerance)   Trazodone And Nefazodone     Sick   Zinc Nausea And Vomiting   Aspirin Rash and Other (See Comments)    Upset stomach   Ativan [Lorazepam] Other (See Comments)    Irritability   Erythromycin Nausea And Vomiting, Rash and Other (See Comments)    Cramps   Lipitor [Atorvastatin] Nausea And Vomiting   Review of Systems  Musculoskeletal:  Positive for back pain (Right lumbar back/flank pain).  All other systems reviewed and are negative.    Objective:     BP (!) 176/89   Pulse 83   Ht 5' 1.75" (1.568 m)    Wt 182 lb 3.2 oz (82.6 kg)   SpO2 95%   BMI 33.60 kg/m  BP Readings from Last 3 Encounters:  05/22/22 (!) 176/89  05/07/22 (!) 155/78  04/09/22 138/86   Physical Exam Vitals reviewed.  Constitutional:      General: She is not in  acute distress.    Appearance: Normal appearance. She is obese. She is not toxic-appearing.  HENT:     Head: Normocephalic and atraumatic.     Right Ear: External ear normal.     Left Ear: External ear normal.     Nose: Nose normal. No congestion or rhinorrhea.     Mouth/Throat:     Mouth: Mucous membranes are moist.     Pharynx: Oropharynx is clear. No oropharyngeal exudate or posterior oropharyngeal erythema.  Eyes:     General: No scleral icterus.    Extraocular Movements: Extraocular movements intact.     Conjunctiva/sclera: Conjunctivae normal.     Pupils: Pupils are equal, round, and reactive to light.  Cardiovascular:     Rate and Rhythm: Normal rate and regular rhythm.     Pulses: Normal pulses.     Heart sounds: Normal heart sounds. No murmur heard.    No friction rub. No gallop.  Pulmonary:     Effort: Pulmonary effort is normal.     Breath sounds: Normal breath sounds. No wheezing, rhonchi or rales.  Abdominal:     General: Abdomen is flat. Bowel sounds are normal. There is no distension.     Palpations: Abdomen is soft.     Tenderness: There is no abdominal tenderness. There is right CVA tenderness.  Musculoskeletal:        General: Tenderness (Tenderness to palpation over the paraspinal muscles of the right lumbar region) present. No swelling. Normal range of motion.     Cervical back: Normal range of motion.     Right lower leg: No edema.     Left lower leg: No edema.  Lymphadenopathy:     Cervical: No cervical adenopathy.  Skin:    General: Skin is warm and dry.     Capillary Refill: Capillary refill takes less than 2 seconds.     Coloration: Skin is not jaundiced.  Neurological:     General: No focal deficit present.      Mental Status: She is alert and oriented to person, place, and time.  Psychiatric:        Mood and Affect: Mood normal.        Behavior: Behavior normal.    Last CBC Lab Results  Component Value Date   WBC 10.6 04/09/2022   HGB 15.2 04/09/2022   HCT 46.2 04/09/2022   MCV 86 04/09/2022   MCH 28.3 04/09/2022   RDW 13.2 04/09/2022   PLT 358 81/27/5170   Last metabolic panel Lab Results  Component Value Date   GLUCOSE 94 04/09/2022   NA 142 04/09/2022   K 4.2 04/09/2022   CL 101 04/09/2022   CO2 21 04/09/2022   BUN 11 04/09/2022   CREATININE 0.82 04/09/2022   EGFR 83 04/09/2022   CALCIUM 10.3 (H) 04/09/2022   PROT 7.7 04/09/2022   ALBUMIN 4.9 04/09/2022   LABGLOB 2.8 04/09/2022   AGRATIO 1.8 04/09/2022   BILITOT 0.7 04/09/2022   ALKPHOS 90 04/09/2022   AST 28 04/09/2022   ALT 34 (H) 04/09/2022   ANIONGAP 8 12/08/2017   Last lipids Lab Results  Component Value Date   CHOL 229 (H) 04/09/2022   HDL 42 04/09/2022   LDLCALC 150 (H) 04/09/2022   TRIG 201 (H) 04/09/2022   CHOLHDL 5.5 (H) 04/09/2022   Last hemoglobin A1c Lab Results  Component Value Date   HGBA1C 5.7 (H) 04/09/2022   Last thyroid functions Lab Results  Component Value Date   TSH  1.110 04/09/2022   T3TOTAL 138.6 02/08/2014   Last vitamin D Lab Results  Component Value Date   VD25OH 25.0 (L) 04/09/2022   Last vitamin B12 and Folate Lab Results  Component Value Date   VITAMINB12 419 04/09/2022   FOLATE 6.6 04/09/2022   The 10-year ASCVD risk score (Arnett DK, et al., 2019) is: 18.9%    Assessment & Plan:   Problem List Items Addressed This Visit       Essential hypertension - Primary    BP elevated today, likely related to acute lumbar back pain.  Follow-up in 4 weeks for BP check.      Right flank pain    Returning to care today for reassessment of right flank and lumbar back pain.  No acute intra-abdominal pathology identified on CT.  Pain is worse with right lateral movements at  the waist.  In the absence of acute intra-abdominal pathology, she likely has a lumbar strain.  I have refilled Flexeril today and prescribed meloxicam 7.5 mg x 2 weeks. -Follow-up in 4 weeks       Return in about 4 weeks (around 06/19/2022).    Johnette Abraham, MD

## 2022-05-29 ENCOUNTER — Encounter (HOSPITAL_COMMUNITY): Payer: Self-pay | Admitting: Psychiatry

## 2022-05-29 ENCOUNTER — Ambulatory Visit (INDEPENDENT_AMBULATORY_CARE_PROVIDER_SITE_OTHER): Payer: Medicare Other | Admitting: Psychiatry

## 2022-05-29 DIAGNOSIS — G4733 Obstructive sleep apnea (adult) (pediatric): Secondary | ICD-10-CM

## 2022-05-29 DIAGNOSIS — Z720 Tobacco use: Secondary | ICD-10-CM

## 2022-05-29 DIAGNOSIS — F1290 Cannabis use, unspecified, uncomplicated: Secondary | ICD-10-CM

## 2022-05-29 DIAGNOSIS — F4310 Post-traumatic stress disorder, unspecified: Secondary | ICD-10-CM | POA: Diagnosis not present

## 2022-05-29 DIAGNOSIS — M797 Fibromyalgia: Secondary | ICD-10-CM

## 2022-05-29 DIAGNOSIS — F603 Borderline personality disorder: Secondary | ICD-10-CM | POA: Diagnosis not present

## 2022-05-29 DIAGNOSIS — M159 Polyosteoarthritis, unspecified: Secondary | ICD-10-CM

## 2022-05-29 DIAGNOSIS — F410 Panic disorder [episodic paroxysmal anxiety] without agoraphobia: Secondary | ICD-10-CM

## 2022-05-29 DIAGNOSIS — F411 Generalized anxiety disorder: Secondary | ICD-10-CM

## 2022-05-29 DIAGNOSIS — F331 Major depressive disorder, recurrent, moderate: Secondary | ICD-10-CM | POA: Diagnosis not present

## 2022-05-29 DIAGNOSIS — G894 Chronic pain syndrome: Secondary | ICD-10-CM

## 2022-05-29 DIAGNOSIS — F5105 Insomnia due to other mental disorder: Secondary | ICD-10-CM | POA: Diagnosis not present

## 2022-05-29 MED ORDER — ARIPIPRAZOLE 5 MG PO TABS: 5.0000 mg | ORAL_TABLET | Freq: Every day | ORAL | 0 refills | Status: DC
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
## 2022-05-29 NOTE — Addendum Note (Signed)
Addended by: Debby Bud on: 05/29/2022 07:19 PM   Modules accepted: Level of Service

## 2022-05-29 NOTE — Patient Instructions (Addendum)
We restarted your abilify today. Start at '5mg'$  once daily. I will messge Dr. Doren Custard about getting an updated ecg for you. Look into DBT (dialectical behavioral therapy) providers through your insurer. For now, continue working with Vickii Chafe for psychotherapy. Here is a website to some worksheets: https://www.therapistaid.com/therapy-worksheet/dbt-improve/dbt/none  Also, for assistance with smoking cessation: ShowReturn.ca

## 2022-05-30 NOTE — Assessment & Plan Note (Signed)
BP elevated today, likely related to acute lumbar back pain.  Follow-up in 4 weeks for BP check.

## 2022-05-30 NOTE — Assessment & Plan Note (Signed)
Returning to care today for reassessment of right flank and lumbar back pain.  No acute intra-abdominal pathology identified on CT.  Pain is worse with right lateral movements at the waist.  In the absence of acute intra-abdominal pathology, she likely has a lumbar strain.  I have refilled Flexeril today and prescribed meloxicam 7.5 mg x 2 weeks. -Follow-up in 4 weeks

## 2022-06-11 ENCOUNTER — Ambulatory Visit (HOSPITAL_COMMUNITY): Admission: RE | Admit: 2022-06-11 | Payer: Medicare Other | Source: Ambulatory Visit

## 2022-06-16 ENCOUNTER — Ambulatory Visit: Payer: Medicare Other | Admitting: Internal Medicine

## 2022-06-16 ENCOUNTER — Encounter: Payer: Self-pay | Admitting: Internal Medicine

## 2022-06-18 ENCOUNTER — Ambulatory Visit (INDEPENDENT_AMBULATORY_CARE_PROVIDER_SITE_OTHER): Payer: Medicare Other | Admitting: Internal Medicine

## 2022-06-18 ENCOUNTER — Encounter: Payer: Self-pay | Admitting: Internal Medicine

## 2022-06-18 VITALS — BP 179/84 | HR 70 | Ht 61.75 in | Wt 182.6 lb

## 2022-06-18 DIAGNOSIS — M7918 Myalgia, other site: Secondary | ICD-10-CM | POA: Diagnosis not present

## 2022-06-18 DIAGNOSIS — M47816 Spondylosis without myelopathy or radiculopathy, lumbar region: Secondary | ICD-10-CM

## 2022-06-18 DIAGNOSIS — G8929 Other chronic pain: Secondary | ICD-10-CM

## 2022-06-18 DIAGNOSIS — M5441 Lumbago with sciatica, right side: Secondary | ICD-10-CM

## 2022-06-18 DIAGNOSIS — I10 Essential (primary) hypertension: Secondary | ICD-10-CM

## 2022-06-18 DIAGNOSIS — M5137 Other intervertebral disc degeneration, lumbosacral region: Secondary | ICD-10-CM | POA: Diagnosis not present

## 2022-06-18 MED ORDER — TIZANIDINE HCL 4 MG PO TABS: 4.0000 mg | ORAL_TABLET | Freq: Four times a day (QID) | ORAL | 0 refills | Status: DC | PRN

## 2022-06-18 MED ORDER — SPIRONOLACTONE 25 MG PO TABS: 25.0000 mg | ORAL_TABLET | Freq: Every day | ORAL | 2 refills | Status: DC

## 2022-06-18 NOTE — Patient Instructions (Signed)
It was a pleasure to see you today.  Thank you for giving [place] the opportunity to be involved in your care.  Below is a brief recap of your visit and next steps.  We will plan to see you again in 2-4 weeks.  Summary Add spironolactone 25 mg daily for hypertension I have placed a referral to orthopedic surgery for your back pain I have ordered labs for autoimmune workup

## 2022-06-18 NOTE — Progress Notes (Signed)
Established Patient Office Visit  Subjective   Patient ID: [name], female    DOB: 09-29-1965  Age: 56 y.o. MRN: [id number]  Chief Complaint  Patient presents with   Follow-up   [name] returns to care today.  She was last seen by me on 10/26 for reassessment of right flank and lumbar back pain.  Her pain had worsened at that time.  Flexeril was refilled and meloxicam was prescribed.  4-week follow-up was arranged.  In the interim she has been seen in follow-up by psychiatry and ophthalmology.  Today [name] continues to endorse lumbar back discomfort.  She also reports myalgias and recently spoke with the family member who has been diagnosed with lupus.  She is concerned that many of her symptoms align with lupus and request to be screened for autoimmune diseases today.  She continues to deny red flag symptoms associated with her lumbar back pain.  Acute concerns and chronic medical conditions discussed today are individually addressed A/P below.  Past Medical History:  Diagnosis Date   Anxiety    Arthritis    "pretty much all over; mainly neck and back" (12/26/2015)   Asthma    Bipolar 1 disorder ([place])    Bipolar disorder ([place])    Cervical cancer ([place])    "stage I"   Chronic back pain    Chronic bronchitis (HCC)    COPD (chronic obstructive pulmonary disease) (HCC)    Degenerative disc disease    Depression    Dysrhythmia    Fibromyalgia    GERD (gastroesophageal reflux disease)    Hepatic cyst    High cholesterol    History of hiatal hernia    Hypertension 08/04/2012   IBS (irritable bowel syndrome) Diagnosed November 2012   Incisional hernia with gangrene and obstruction 12/26/2015   Melanoma of back ([place])    Ovarian cancer ([place])    "stage II"   Plantar fasciitis, bilateral    Sleep apnea    cpap coming  "soon" (12/26/2015)   Past Surgical History:  Procedure Laterality Date   [place]  2015    CARPAL TUNNEL RELEASE Right    [place]   COLONOSCOPY  November 2012   EYE SURGERY     CATARACTS REMOVED 09/09/17 and 09/16/17   HERNIA REPAIR     INCISIONAL HERNIA REPAIR N/A 12/26/2015   Procedure: LAPAROSCOPIC REPAIR INCISIONAL HERNIA WITH MESH;  Surgeon: Fanny Skates, MD;  Location: [facility];  Service: General;  Laterality: N/A;   INSERTION OF MESH N/A 12/26/2015   Procedure: INSERTION OF MESH;  Surgeon: Fanny Skates, MD;  Location: [facility];  Service: General;  Laterality: N/A;   [place] / UMBILICAL / [place]  12/26/2015   repair of incarcerated incisional hernia with mesh   LIVER CYST REMOVAL  2014   MELANOMA EXCISION  January 2013   removed from back   SHOULDER SURGERY Left 2010   Humeral Head Microfracture    TUBAL LIGATION     UPPER GASTROINTESTINAL ENDOSCOPY  November 2012   Social History   Tobacco Use   Smoking status: Former    Packs/day: 0.00    Years: 0.00    Total pack years: 0.00    Types: Cigarettes, E-cigarettes    Quit date: 06/12/2018    Years since quitting: 4.0  Smokeless tobacco: Current   Tobacco comments:    Smokes hemp every night to help with her pain  Vaping Use   Vaping Use: Every day   Start date: 04/18/2019  Substance Use Topics   Alcohol use: No    Comment: 12/26/2015 'quit in ~ 1997"   Drug use: Yes    Types: Other-see comments, Marijuana    Comment: 12/26/2015 "quit in ~  2010".   smokes CBD with 0.3% THC nightly   Family History  Adopted: Yes  Problem Relation Age of Onset   Bipolar disorder Mother        never diagnosed but patient suspects mother had bipolar disorder.Marland KitchenMarland KitchenMarland KitchenMarland Kitchenpt was adopeted, only knew birth mother for a short period of time.   Anxiety disorder Mother    Cirrhosis Mother    Diabetes Mother    Turner syndrome Daughter    Cancer Daughter    Anxiety disorder Daughter    Bipolar disorder Daughter     Interstitial cystitis Daughter    ADD / ADHD Neg Hx    Alcohol abuse Neg Hx    Drug abuse Neg Hx    Dementia Neg Hx    Depression Neg Hx    OCD Neg Hx    Paranoid behavior Neg Hx    Schizophrenia Neg Hx    Seizures Neg Hx    Sexual abuse Neg Hx    Physical abuse Neg Hx    Colon cancer Neg Hx    Allergies  Allergen Reactions   Barium Sulfate Nausea And Vomiting    Stomach cramps, extreme diarrhea, and vomiting   Barium-Containing Compounds Other (See Comments)    Stomach cramps, extreme diarrhea, and vomiting   Bee Venom Anaphylaxis   Haemophilus Influenzae Swelling    Trouble swallowing   Influenza Virus Vaccine Swelling    Trouble swallowing Trouble swallowing    Seldane [Terfenadine] Nausea And Vomiting and Other (See Comments)    CAUSES TOP LAYER OF SKIN TO PEEL   Sulfa Antibiotics Anaphylaxis   Amitriptyline     Other reaction(s): Other (See Comments) Mood instability   Lisinopril Cough   Lithium Other (See Comments)    Agitation and hostility   Tetracyclines & Related Hives and Nausea And Vomiting   Celebrex [Celecoxib] Nausea And Vomiting   Dexamethasone     Elevated blood sugar,nausea,diarrhea, muscle weakness   Metformin And Related Nausea And Vomiting   Prednisone     Other reaction(s): Generalized Edema (intolerance)   Trazodone And Nefazodone     Sick   Zinc Nausea And Vomiting   Aspirin Rash and Other (See Comments)    Upset stomach   Ativan [Lorazepam] Other (See Comments)    Irritability   Erythromycin Nausea And Vomiting, Rash and Other (See Comments)    Cramps   Lipitor [Atorvastatin] Nausea And Vomiting   Review of Systems  Musculoskeletal:  Positive for back pain (R lumbar) and myalgias.  All other systems reviewed and are negative.    Objective:     BP (!) 179/84   Pulse 70   Ht 5' 1.75" (1.568 m)   Wt 182 lb 9.6 oz (82.8 kg)   SpO2 96%   BMI 33.67 kg/m  BP Readings from Last 3 Encounters:  06/23/22 (!) 199/79  06/18/22 (!)  179/84  05/22/22 (!) 176/89   Physical Exam Vitals reviewed.  Constitutional:      General: She is not in acute distress.    Appearance: Normal appearance. She is obese.  She is not toxic-appearing.  HENT:     Head: Normocephalic and atraumatic.     Right Ear: External ear normal.     Left Ear: External ear normal.     Nose: Nose normal. No congestion or rhinorrhea.     Mouth/Throat:     Mouth: Mucous membranes are moist.     Pharynx: Oropharynx is clear. No oropharyngeal exudate or posterior oropharyngeal erythema.  Eyes:     General: No scleral icterus.    Extraocular Movements: Extraocular movements intact.     Conjunctiva/sclera: Conjunctivae normal.     Pupils: Pupils are equal, round, and reactive to light.  Cardiovascular:     Rate and Rhythm: Normal rate and regular rhythm.     Pulses: Normal pulses.     Heart sounds: Normal heart sounds. No murmur heard.    No friction rub. No gallop.  Pulmonary:     Effort: Pulmonary effort is normal.     Breath sounds: Normal breath sounds. No wheezing, rhonchi or rales.  Abdominal:     General: Abdomen is flat. Bowel sounds are normal. There is no distension.     Palpations: Abdomen is soft.     Tenderness: There is abdominal tenderness (TTP over R paraspinal muscles of lumbar region).  Musculoskeletal:        General: No swelling. Normal range of motion.     Cervical back: Normal range of motion.     Right lower leg: No edema.     Left lower leg: No edema.  Lymphadenopathy:     Cervical: No cervical adenopathy.  Skin:    General: Skin is warm and dry.     Capillary Refill: Capillary refill takes less than 2 seconds.     Coloration: Skin is not jaundiced.  Neurological:     General: No focal deficit present.     Mental Status: She is alert and oriented to person, place, and time.  Psychiatric:        Mood and Affect: Mood normal.        Behavior: Behavior normal.    Last CBC Lab Results  Component Value Date   WBC  10.6 04/09/2022   HGB 15.2 04/09/2022   HCT 46.2 04/09/2022   MCV 86 04/09/2022   MCH 28.3 04/09/2022   RDW 13.2 04/09/2022   PLT 358 25/63/8937   Last metabolic panel Lab Results  Component Value Date   GLUCOSE 94 04/09/2022   NA 142 04/09/2022   K 4.2 04/09/2022   CL 101 04/09/2022   CO2 21 04/09/2022   BUN 11 04/09/2022   CREATININE 0.82 04/09/2022   EGFR 83 04/09/2022   CALCIUM 10.3 (H) 04/09/2022   PROT 7.7 04/09/2022   ALBUMIN 4.9 04/09/2022   LABGLOB 2.8 04/09/2022   AGRATIO 1.8 04/09/2022   BILITOT 0.7 04/09/2022   ALKPHOS 90 04/09/2022   AST 28 04/09/2022   ALT 34 (H) 04/09/2022   ANIONGAP 8 12/08/2017   Last lipids Lab Results  Component Value Date   CHOL 229 (H) 04/09/2022   HDL 42 04/09/2022   LDLCALC 150 (H) 04/09/2022   TRIG 201 (H) 04/09/2022   CHOLHDL 5.5 (H) 04/09/2022   Last hemoglobin A1c Lab Results  Component Value Date   HGBA1C 5.7 (H) 04/09/2022   Last thyroid functions Lab Results  Component Value Date   TSH 1.110 04/09/2022   T3TOTAL 138.6 02/08/2014   Last vitamin D Lab Results  Component Value Date   VD25OH 25.0 (L)  04/09/2022   Last vitamin B12 and Folate Lab Results  Component Value Date   VITAMINB12 419 04/09/2022   FOLATE 6.6 04/09/2022   The 10-year ASCVD risk score (Arnett DK, et al., 2019) is: 23.5%    Assessment & Plan:   Problem List Items Addressed This Visit       Essential hypertension    BP remains elevated today, 179/84.  She is currently prescribed amlodipine 10 mg daily, losartan 100 mg daily, and HCTZ 25 mg daily. -Add spironolactone 25 mg daily -Follow-up in 2 weeks for HTN check      Lumbar facet arthropathy (L4/5 and L5/S1)    She has a previously documented history of lumbar facet arthropathy and has required lumbosacral medial branch nerve block, which was performed at [facility] pain management.  She is not currently followed by pain management.  She continues to endorse persistent right lumbar  back and flank discomfort today. -I placed referral to orthopedic surgery today for further evaluation and management recommendations -Zanaflex has been refilled today      Myalgia, multiple sites - Primary    Today she endorses diffuse myalgias and is concerned that she has lupus after speaking with a family member who has lupus.  She requests to be screened for autoimmune diseases today.  No clinical findings concerning for autoimmune process on exam today. -Autoimmune labs ordered at her request -We will review results at follow-up in 2 weeks       Return in about 2 weeks (around 07/02/2022) for HTN, Lab review.    Johnette Abraham, MD

## 2022-06-21 LAB — C-REACTIVE PROTEIN: CRP: 10 mg/L (ref 0–10)

## 2022-06-21 LAB — CYCLIC CITRUL PEPTIDE ANTIBODY, IGG/IGA: Cyclic Citrullin Peptide Ab: 2 units (ref 0–19)

## 2022-06-21 LAB — ANA+ENA+DNA/DS+SCL 70+SJOSSA/B
ANA Titer 1: NEGATIVE
ENA RNP Ab: <0.2 AI (ref 0.0–0.9)
ENA SM Ab Ser-aCnc: <0.2 AI (ref 0.0–0.9)
ENA SSA (RO) Ab: <0.2 AI (ref 0.0–0.9)
ENA SSB (LA) Ab: <0.2 AI (ref 0.0–0.9)
Scleroderma (Scl-70) (ENA) Antibody, IgG: <0.2 AI (ref 0.0–0.9)
dsDNA Ab: <1 IU/mL (ref 0–9)

## 2022-06-21 LAB — SEDIMENTATION RATE: Sed Rate: 12 mm/hr (ref 0–40)

## 2022-06-23 ENCOUNTER — Encounter: Payer: Self-pay | Admitting: Obstetrics and Gynecology

## 2022-06-23 ENCOUNTER — Ambulatory Visit (INDEPENDENT_AMBULATORY_CARE_PROVIDER_SITE_OTHER): Payer: Medicare Other | Admitting: Obstetrics and Gynecology

## 2022-06-23 VITALS — BP 199/79 | HR 71 | Ht 61.0 in | Wt 186.0 lb

## 2022-06-23 DIAGNOSIS — Z01419 Encounter for gynecological examination (general) (routine) without abnormal findings: Secondary | ICD-10-CM | POA: Diagnosis not present

## 2022-06-23 MED ORDER — CLOBETASOL PROPIONATE 0.05 % EX OINT: TOPICAL_OINTMENT | CUTANEOUS | 6 refills | Status: AC

## 2022-06-23 NOTE — Assessment & Plan Note (Addendum)
She has a previously documented history of lumbar facet arthropathy and has required lumbosacral medial branch nerve block, which was performed at [facility] pain management.  She is not currently followed by pain management.  She continues to endorse persistent right lumbar back and flank discomfort today. -I placed referral to orthopedic surgery today for further evaluation and management recommendations -Zanaflex has been refilled today

## 2022-06-23 NOTE — Assessment & Plan Note (Addendum)
BP remains elevated today, 179/84.  She is currently prescribed amlodipine 10 mg daily, losartan 100 mg daily, and HCTZ 25 mg daily. -Add spironolactone 25 mg daily -Follow-up in 2 weeks for HTN check
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
## 2022-06-23 NOTE — Assessment & Plan Note (Signed)
Today she endorses diffuse myalgias and is concerned that she has lupus after speaking with a family member who has lupus.  She requests to be screened for autoimmune diseases today.  No clinical findings concerning for autoimmune process on exam today. -Autoimmune labs ordered at her request -We will review results at follow-up in 2 weeks

## 2022-06-30 ENCOUNTER — Ambulatory Visit (HOSPITAL_COMMUNITY): Payer: Medicare Other | Admitting: Psychiatry

## 2022-07-01 ENCOUNTER — Encounter: Payer: Self-pay | Admitting: Internal Medicine

## 2022-07-01 ENCOUNTER — Ambulatory Visit (INDEPENDENT_AMBULATORY_CARE_PROVIDER_SITE_OTHER): Payer: Medicare Other | Admitting: Internal Medicine

## 2022-07-01 ENCOUNTER — Encounter (HOSPITAL_COMMUNITY): Payer: Self-pay | Admitting: Psychiatry

## 2022-07-01 ENCOUNTER — Telehealth (INDEPENDENT_AMBULATORY_CARE_PROVIDER_SITE_OTHER): Payer: Medicare Other | Admitting: Psychiatry

## 2022-07-01 DIAGNOSIS — F411 Generalized anxiety disorder: Secondary | ICD-10-CM

## 2022-07-01 DIAGNOSIS — F603 Borderline personality disorder: Secondary | ICD-10-CM | POA: Diagnosis not present

## 2022-07-01 DIAGNOSIS — F1290 Cannabis use, unspecified, uncomplicated: Secondary | ICD-10-CM

## 2022-07-01 DIAGNOSIS — F5105 Insomnia due to other mental disorder: Secondary | ICD-10-CM | POA: Diagnosis not present

## 2022-07-01 DIAGNOSIS — F4310 Post-traumatic stress disorder, unspecified: Secondary | ICD-10-CM

## 2022-07-01 DIAGNOSIS — M797 Fibromyalgia: Secondary | ICD-10-CM

## 2022-07-01 DIAGNOSIS — R1031 Right lower quadrant pain: Secondary | ICD-10-CM

## 2022-07-01 DIAGNOSIS — F17210 Nicotine dependence, cigarettes, uncomplicated: Secondary | ICD-10-CM

## 2022-07-01 DIAGNOSIS — F410 Panic disorder [episodic paroxysmal anxiety] without agoraphobia: Secondary | ICD-10-CM

## 2022-07-01 DIAGNOSIS — Z720 Tobacco use: Secondary | ICD-10-CM

## 2022-07-01 DIAGNOSIS — F331 Major depressive disorder, recurrent, moderate: Secondary | ICD-10-CM | POA: Diagnosis not present

## 2022-07-01 MED ORDER — ARIPIPRAZOLE 5 MG PO TABS: 5.0000 mg | ORAL_TABLET | Freq: Every day | ORAL | 1 refills | Status: DC

## 2022-07-01 NOTE — Patient Instructions (Signed)
Please go by your PCP's office to get an ecg and updated lipid panel, A1c this month. Otherwise, no medication changes today. You may want to get a sleep study as untreated sleep apnea can worsen hypertension.
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
## 2022-07-01 NOTE — Progress Notes (Signed)
   Acute Telephone Visit  Virtual Visit via Telephone Note  I connected with [name] on 07/01/22 at 11:40 AM EST by telephone and verified that I am speaking with the correct person using two identifiers.  Chief Complaint  Patient presents with   Diverticulitis    Pain in lower right side. Had this in the past and feels the same as before. Has to hold stomach when walking.   Location: Patient: [address] [place], [place] [address] Provider: [address], [place], [place] [address]   I discussed the limitations, risks, security and privacy concerns of performing an evaluation and management service by telephone and the availability of in person appointments. I also discussed with the patient that there may be a patient responsible charge related to this service. The patient expressed understanding and agreed to proceed.   History of Present Illness:  [name] has been evaluated today via acute telephone encounter for a 2-day history of right lower quadrant abdominal pain.  She reports onset of pain around midday on Sunday 12/3.  There is no inciting event or trauma at the onset of pain.  She currently endorses soreness in her right lower quadrant that is worse with movement, noting that she has to "hold" her abdomen while walking to minimize pain.  The only thing that brings relief is sitting completely still.  She has tried taking ibuprofen and using a heating pad, both of which have not helped.  She denies systemic symptoms of fever/chills, nausea/vomiting, and diarrhea/constipation.  She endorses a history of chronic diarrhea, but notes that her stools have been more formed recently.  She denies noticing any blood in her stool.  She has a remote history of diverticulitis and states that her current symptoms feel similar.  Assessment and Plan:  Right lower quadrant abdominal pain Evaluated today for a 2-day history of the symptoms endorsed above.  She is concerned that she has  diverticulitis.  Given her current symptoms, I recommended CT abdomen pelvis.  She plans to present for this tomorrow (12/6).  In the interim, I recommended that she take Tylenol every 4-6 hours for pain control and adhere to a mostly liquid diet.  Further treatment to be determined by CT results.  Follow Up Instructions:    I discussed the assessment and treatment plan with the patient. The patient was provided an opportunity to ask questions and all were answered. The patient agreed with the plan and demonstrated an understanding of the instructions.   The patient was advised to call back or seek an in-person evaluation if the symptoms worsen or if the condition fails to improve as anticipated.  I provided 10 minutes of non-face-to-face time during this encounter.   Johnette Abraham, MD

## 2022-07-02 ENCOUNTER — Ambulatory Visit (HOSPITAL_COMMUNITY): Payer: Medicare Other

## 2022-07-02 ENCOUNTER — Ambulatory Visit: Payer: Medicare Other | Admitting: Internal Medicine

## 2022-07-03 ENCOUNTER — Ambulatory Visit (HOSPITAL_COMMUNITY)
Admission: RE | Admit: 2022-07-03 | Discharge: 2022-07-03 | Disposition: A | Discharge status: Home / Self Care | Payer: Medicare Other | Source: Ambulatory Visit | Attending: Internal Medicine | Admitting: Internal Medicine

## 2022-07-03 DIAGNOSIS — R1031 Right lower quadrant pain: Secondary | ICD-10-CM | POA: Insufficient documentation

## 2022-07-03 MED ORDER — IOHEXOL 9 MG/ML PO SOLN
ORAL | Status: AC
  Filled 2022-07-03: qty 1000

## 2022-07-03 MED ORDER — IOHEXOL 300 MG/ML  SOLN
100.0000 mL | Freq: Once | INTRAMUSCULAR | Status: AC | PRN
  Administered 2022-07-03: 100 mL via INTRAVENOUS

## 2022-07-04 ENCOUNTER — Telehealth: Payer: Self-pay | Admitting: Internal Medicine

## 2022-07-04 DIAGNOSIS — E782 Mixed hyperlipidemia: Secondary | ICD-10-CM

## 2022-07-04 NOTE — Telephone Encounter (Signed)
Called pt to review CT results.  All questions answered.  Her abdominal pain is improving.  She has a follow-up appointment scheduled me for 12/15.

## 2022-07-04 NOTE — Telephone Encounter (Signed)
Pt called in for call back with CT results

## 2022-07-08 ENCOUNTER — Other Ambulatory Visit: Payer: Self-pay

## 2022-07-08 DIAGNOSIS — E782 Mixed hyperlipidemia: Secondary | ICD-10-CM

## 2022-07-09 LAB — LIPID PANEL
Chol/HDL Ratio: 3.6 ratio (ref 0.0–4.4)
Cholesterol, Total: 150 mg/dL (ref 100–199)
HDL: 42 mg/dL (ref >39)
LDL Chol Calc (NIH): 87 mg/dL (ref 0–99)
Triglycerides: 115 mg/dL (ref 0–149)
VLDL Cholesterol Cal: 21 mg/dL (ref 5–40)

## 2022-07-11 ENCOUNTER — Encounter: Payer: Self-pay | Admitting: Internal Medicine

## 2022-07-11 ENCOUNTER — Ambulatory Visit (INDEPENDENT_AMBULATORY_CARE_PROVIDER_SITE_OTHER): Payer: Medicare Other | Admitting: Internal Medicine

## 2022-07-11 VITALS — BP 96/53 | HR 88 | Ht 61.0 in | Wt 180.2 lb

## 2022-07-11 DIAGNOSIS — E782 Mixed hyperlipidemia: Secondary | ICD-10-CM | POA: Diagnosis not present

## 2022-07-11 DIAGNOSIS — M51379 Other intervertebral disc degeneration, lumbosacral region without mention of lumbar back pain or lower extremity pain: Secondary | ICD-10-CM

## 2022-07-11 DIAGNOSIS — F603 Borderline personality disorder: Secondary | ICD-10-CM | POA: Diagnosis not present

## 2022-07-11 DIAGNOSIS — G4733 Obstructive sleep apnea (adult) (pediatric): Secondary | ICD-10-CM

## 2022-07-11 DIAGNOSIS — F410 Panic disorder [episodic paroxysmal anxiety] without agoraphobia: Secondary | ICD-10-CM

## 2022-07-11 DIAGNOSIS — F411 Generalized anxiety disorder: Secondary | ICD-10-CM | POA: Diagnosis not present

## 2022-07-11 DIAGNOSIS — M5441 Lumbago with sciatica, right side: Secondary | ICD-10-CM

## 2022-07-11 DIAGNOSIS — M5137 Other intervertebral disc degeneration, lumbosacral region: Secondary | ICD-10-CM

## 2022-07-11 DIAGNOSIS — G8929 Other chronic pain: Secondary | ICD-10-CM

## 2022-07-11 DIAGNOSIS — I10 Essential (primary) hypertension: Secondary | ICD-10-CM

## 2022-07-11 MED ORDER — TIZANIDINE HCL 4 MG PO TABS: 4.0000 mg | ORAL_TABLET | Freq: Four times a day (QID) | ORAL | 0 refills | Status: DC | PRN

## 2022-07-11 NOTE — Progress Notes (Signed)
Established Patient Office Visit  Subjective   Patient ID: [name], female    DOB: 12/11/1965  Age: 56 y.o. MRN: [id number]  Chief Complaint  Patient presents with   Hypertension    Follow up; BS or BP dropping possibly?   [name] returns to care today for HTN follow-up.  She was last seen by me on 11/22 at which time spironolactone 25 mg daily was added for treatment of hypertension.  In the interim she was evaluated by me via telephone encounter on 12/5 for right lower quadrant abdominal pain.  Her medical history is significant for diverticulitis.  A CT A/P was ordered that did not show any intra-abdominal findings.  She has also established care with OB/GYN since her last appointment.  Today [name] states that her abdominal pain is improving. Her blood pressure is borderline hypotensive today (96/53).  She has had episodes at home where she will feel lightheaded upon standing up and endorses facial flushing, however this seems to resolve with standing still for a brief period of time.  She has been taking her antihypertensive medications as prescribed.  Past Medical History:  Diagnosis Date   Acute pansinusitis 05/03/2019   Anxiety    Arthritis    "pretty much all over; mainly neck and back" (12/26/2015)   Asthma    Atypical squamous cell changes of undetermined significance (ASCUS) on vaginal cytology 04/11/2022   Bipolar 1 disorder (HCC)    Bipolar disorder ([place])    Bipolar I disorder, most recent episode depressed ([place]) 07/04/2014   Cervical cancer ([place])    "stage I"   Chronic back pain    Chronic bronchitis ([place])    Colon cancer screening 02/15/2018   COPD (chronic obstructive pulmonary disease) ([place])    Degenerative disc disease    Depression    Dysrhythmia    Fibromyalgia    GERD (gastroesophageal reflux disease)    Hepatic cyst    High cholesterol    History of hiatal hernia    Hypertension 08/04/2012   IBS (irritable bowel syndrome) Diagnosed November  2012   Incisional hernia with gangrene and obstruction 12/26/2015   Melanoma of back ([place])    Ovarian cancer ([place])    "stage II"   Plantar fasciitis, bilateral    Preventative health care 04/11/2022   Sleep apnea    cpap coming  "soon" (12/26/2015)   Past Surgical History:  Procedure Laterality Date   [place]  2015   CARPAL TUNNEL RELEASE Right    [place]   COLONOSCOPY  November 2012   EYE SURGERY     CATARACTS REMOVED 09/09/17 and 09/16/17   HERNIA REPAIR     INCISIONAL HERNIA REPAIR N/A 12/26/2015   Procedure: LAPAROSCOPIC REPAIR INCISIONAL HERNIA WITH MESH;  Surgeon: Fanny Skates, MD;  Location: [facility];  Service: General;  Laterality: N/A;   INSERTION OF MESH N/A 12/26/2015   Procedure: INSERTION OF MESH;  Surgeon: Fanny Skates, MD;  Location: [facility];  Service: General;  Laterality: N/A;   [place] / UMBILICAL / [place]  12/26/2015   repair of incarcerated incisional hernia with mesh   LIVER CYST REMOVAL  2014   MELANOMA EXCISION  January 2013   removed from back   SHOULDER SURGERY Left 2010   Humeral Head Microfracture  TUBAL LIGATION     UPPER GASTROINTESTINAL ENDOSCOPY  November 2012   Social History   Tobacco Use   Smoking status: Former    Packs/day: 0.00    Years: 0.00    Total pack years: 0.00    Types: Cigarettes, E-cigarettes    Quit date: 06/12/2018    Years since quitting: 4.0   Smokeless tobacco: Current   Tobacco comments:    Smokes hemp every night to help with her pain  Vaping Use   Vaping Use: Every day   Start date: 04/18/2019  Substance Use Topics   Alcohol use: No    Comment: 12/26/2015 'quit in ~ 1997"   Drug use: Yes    Types: Other-see comments, Marijuana    Comment: 12/26/2015 "quit in ~  2010".   smokes CBD with 0.3% THC nightly   Family History  Adopted:  Yes  Problem Relation Age of Onset   Bipolar disorder Mother        never diagnosed but patient suspects mother had bipolar disorder.Marland KitchenMarland KitchenMarland KitchenMarland Kitchenpt was adopeted, only knew birth mother for a short period of time.   Anxiety disorder Mother    Cirrhosis Mother    Diabetes Mother    Turner syndrome Daughter    Cancer Daughter    Anxiety disorder Daughter    Bipolar disorder Daughter    Interstitial cystitis Daughter    ADD / ADHD Neg Hx    Alcohol abuse Neg Hx    Drug abuse Neg Hx    Dementia Neg Hx    Depression Neg Hx    OCD Neg Hx    Paranoid behavior Neg Hx    Schizophrenia Neg Hx    Seizures Neg Hx    Sexual abuse Neg Hx    Physical abuse Neg Hx    Colon cancer Neg Hx    Allergies  Allergen Reactions   Barium Sulfate Nausea And Vomiting    Stomach cramps, extreme diarrhea, and vomiting   Barium-Containing Compounds Other (See Comments)    Stomach cramps, extreme diarrhea, and vomiting   Bee Venom Anaphylaxis   Haemophilus Influenzae Swelling    Trouble swallowing   Influenza Virus Vaccine Swelling    Trouble swallowing Trouble swallowing    Seldane [Terfenadine] Nausea And Vomiting and Other (See Comments)    CAUSES TOP LAYER OF SKIN TO PEEL   Sulfa Antibiotics Anaphylaxis   Amitriptyline     Other reaction(s): Other (See Comments) Mood instability   Lisinopril Cough   Lithium Other (See Comments)    Agitation and hostility   Tetracyclines & Related Hives and Nausea And Vomiting   Celebrex [Celecoxib] Nausea And Vomiting   Dexamethasone     Elevated blood sugar,nausea,diarrhea, muscle weakness   Metformin And Related Nausea And Vomiting   Prednisone     Other reaction(s): Generalized Edema (intolerance)   Trazodone And Nefazodone     Sick   Zinc Nausea And Vomiting   Aspirin Rash and Other (See Comments)    Upset stomach   Ativan [Lorazepam] Other (See Comments)    Irritability   Erythromycin Nausea And Vomiting, Rash and Other (See Comments)    Cramps    Lipitor [Atorvastatin] Nausea And Vomiting   Review of Systems  Gastrointestinal:  Positive for abdominal pain (resolving RLQ pain).  Neurological:  Positive for dizziness.  All other systems reviewed and are negative.    Objective:     BP (!) 96/53   Pulse 88   Ht 5' 1" (  1.549 m)   Wt 180 lb 3.2 oz (81.7 kg)   SpO2 98%   BMI 34.05 kg/m  BP Readings from Last 3 Encounters:  07/11/22 (!) 96/53  06/23/22 (!) 199/79  06/18/22 (!) 179/84   Physical Exam Vitals reviewed.  Constitutional:      General: She is not in acute distress.    Appearance: Normal appearance. She is obese. She is not toxic-appearing.  HENT:     Head: Normocephalic and atraumatic.     Right Ear: External ear normal.     Left Ear: External ear normal.     Nose: Nose normal. No congestion or rhinorrhea.     Mouth/Throat:     Mouth: Mucous membranes are moist.     Pharynx: Oropharynx is clear. No oropharyngeal exudate or posterior oropharyngeal erythema.  Eyes:     General: No scleral icterus.    Extraocular Movements: Extraocular movements intact.     Conjunctiva/sclera: Conjunctivae normal.     Pupils: Pupils are equal, round, and reactive to light.  Cardiovascular:     Rate and Rhythm: Normal rate and regular rhythm.     Pulses: Normal pulses.     Heart sounds: Normal heart sounds. No murmur heard.    No friction rub. No gallop.  Pulmonary:     Effort: Pulmonary effort is normal.     Breath sounds: Normal breath sounds. No wheezing, rhonchi or rales.  Abdominal:     General: Abdomen is flat. Bowel sounds are normal. There is no distension.     Palpations: Abdomen is soft.     Tenderness: There is no abdominal tenderness.  Musculoskeletal:        General: No swelling. Normal range of motion.     Cervical back: Normal range of motion.     Right lower leg: No edema.     Left lower leg: No edema.  Lymphadenopathy:     Cervical: No cervical adenopathy.  Skin:    General: Skin is warm and dry.      Capillary Refill: Capillary refill takes less than 2 seconds.     Coloration: Skin is not jaundiced.  Neurological:     General: No focal deficit present.     Mental Status: She is alert and oriented to person, place, and time.  Psychiatric:        Mood and Affect: Mood normal.        Behavior: Behavior normal.    Last CBC Lab Results  Component Value Date   WBC 10.6 04/09/2022   HGB 15.2 04/09/2022   HCT 46.2 04/09/2022   MCV 86 04/09/2022   MCH 28.3 04/09/2022   RDW 13.2 04/09/2022   PLT 358 04/09/2022   Last metabolic panel Lab Results  Component Value Date   GLUCOSE 94 04/09/2022   NA 142 04/09/2022   K 4.2 04/09/2022   CL 101 04/09/2022   CO2 21 04/09/2022   BUN 11 04/09/2022   CREATININE 0.82 04/09/2022   EGFR 83 04/09/2022   CALCIUM 10.3 (H) 04/09/2022   PROT 7.7 04/09/2022   ALBUMIN 4.9 04/09/2022   LABGLOB 2.8 04/09/2022   AGRATIO 1.8 04/09/2022   BILITOT 0.7 04/09/2022   ALKPHOS 90 04/09/2022   AST 28 04/09/2022   ALT 34 (H) 04/09/2022   ANIONGAP 8 12/08/2017   Last lipids Lab Results  Component Value Date   CHOL 150 07/08/2022   HDL 42 07/08/2022   LDLCALC 87 07/08/2022   TRIG 115 07/08/2022     CHOLHDL 3.6 07/08/2022   Last hemoglobin A1c Lab Results  Component Value Date   HGBA1C 5.7 (H) 04/09/2022   Last thyroid functions Lab Results  Component Value Date   TSH 1.110 04/09/2022   T3TOTAL 138.6 02/08/2014   Last vitamin D Lab Results  Component Value Date   VD25OH 25.0 (L) 04/09/2022   Last vitamin B12 and Folate Lab Results  Component Value Date   VITAMINB12 419 04/09/2022   FOLATE 6.6 04/09/2022    The 10-year ASCVD risk score (Arnett DK, et al., 2019) is: 4%    Assessment & Plan:   Problem List Items Addressed This Visit       Essential hypertension    Spironolactone 25 mg daily was added at her last appointment for treatment of hypertension.  She is additionally prescribed amlodipine 10 mg daily as well as  losartan-HCTZ 100-25 mg daily.  Her blood pressure today is low (96/53).  She additionally describes recent symptoms of dizziness/lightheadedness and facial flushing upon standing, which seems most consistent with orthostasis. -Discontinue Aldactone today -Follow-up in 4 weeks for BP check      OSA (obstructive sleep apnea) - Primary    Previous history of OSA.  She is currently noncompliant with CPAP and states that she needs an updated sleep study in order to receive a new CPAP machine and supplies. -Pulmonology referral placed today      Relevant Orders   Ambulatory referral to Pulmonology   Borderline personality disorder ([place]) (Chronic)    She has established care with psychiatry and Abilify has been restarted.  An ECG has been ordered today for QTc monitoring per psychiatry request.       Return in about 4 weeks (around 08/08/2022) for HTN.    Johnette Abraham, MD

## 2022-07-11 NOTE — Assessment & Plan Note (Signed)
Previous history of OSA.  She is currently noncompliant with CPAP and states that she needs an updated sleep study in order to receive a new CPAP machine and supplies. -Pulmonology referral placed today

## 2022-07-11 NOTE — Assessment & Plan Note (Signed)
She has established care with psychiatry and Abilify has been restarted.  An ECG has been ordered today for QTc monitoring per psychiatry request.

## 2022-07-11 NOTE — Assessment & Plan Note (Addendum)
Spironolactone 25 mg daily was added at her last appointment for treatment of hypertension.  She is additionally prescribed amlodipine 10 mg daily as well as losartan-HCTZ 100-25 mg daily.  Her blood pressure today is low (96/53).  She additionally describes recent symptoms of dizziness/lightheadedness and facial flushing upon standing, which seems most consistent with orthostasis. -Discontinue Aldactone today -Follow-up in 4 weeks for BP check

## 2022-07-11 NOTE — Patient Instructions (Signed)
It was a pleasure to see you today.  Thank you for giving [place] the opportunity to be involved in your care.  Below is a brief recap of your visit and next steps.  We will plan to see you again in 4 weeks.  Summary Discontinue aldactone I have ordered an ECG today and placed a referral to pulmonology for sleep apnea. Follow up in 4 weeks for BP check

## 2022-07-15 ENCOUNTER — Ambulatory Visit (INDEPENDENT_AMBULATORY_CARE_PROVIDER_SITE_OTHER): Payer: Medicare Other

## 2022-07-15 ENCOUNTER — Encounter: Payer: Self-pay | Admitting: Orthopaedic Surgery

## 2022-07-15 ENCOUNTER — Ambulatory Visit (INDEPENDENT_AMBULATORY_CARE_PROVIDER_SITE_OTHER): Payer: Medicare Other | Admitting: Orthopaedic Surgery

## 2022-07-15 VITALS — BP 160/75 | HR 85 | Ht 61.0 in | Wt 180.0 lb

## 2022-07-15 DIAGNOSIS — M5442 Lumbago with sciatica, left side: Secondary | ICD-10-CM

## 2022-07-15 DIAGNOSIS — M5441 Lumbago with sciatica, right side: Secondary | ICD-10-CM

## 2022-07-15 DIAGNOSIS — M25551 Pain in right hip: Secondary | ICD-10-CM | POA: Diagnosis not present

## 2022-07-15 DIAGNOSIS — G8929 Other chronic pain: Secondary | ICD-10-CM

## 2022-07-15 NOTE — Patient Instructions (Addendum)
 Central Scheduling [phone]  While we are working on your approval please go ahead and call to schedule your appointment to be done within one week. If you can not get an appointment at [facility] within the next week, ask if they have something sooner at [facility] or [facility] if you are able to go to [place] to have the imaging done.  AFTER you have made your imaging appointment, please call our office back at [phone] to schedule an appointment to review your results.   Dr.Keeling is here all day on Tuesdays. He is here half a day on Wednesday mornings, and Thursday mornings. If you need anything such as a medication refill, please either call BEFORE the end of the day on [facility] or send a message through [facility]. Your pharmacy can send a refill request for you. Calling by the end of the day on [facility] allows [place] time to send Dr.Keeling the request and for him to respond before he leaves on Thursdays.  My name is  and I assist [place]. If you need anything before your next appointment, please do not hesitate to call the office at [phone] and ask to leave a message for me. I will respond within 24-48 business hours.
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
## 2022-08-08 ENCOUNTER — Ambulatory Visit (HOSPITAL_COMMUNITY): Payer: 59 | Admitting: Psychiatry

## 2022-08-08 ENCOUNTER — Ambulatory Visit: Payer: Medicare Other | Admitting: Internal Medicine

## 2022-08-08 ENCOUNTER — Telehealth (HOSPITAL_COMMUNITY): Payer: Self-pay | Admitting: Psychiatry

## 2022-08-08 NOTE — Telephone Encounter (Signed)
Therapist contacted patient for scheduled appointment via text through [place].  Patient requests referral to a provider who specializes in DBT skills training.  Therapist provides patient with 3 referrals.  Therapist and patient agree to cancel today's appointment as patient will pursue specialized services.  Patient is encouraged to contact this practice should she need therapy services from this practice in the future.

## 2022-08-11 ENCOUNTER — Ambulatory Visit (HOSPITAL_COMMUNITY)
Admission: RE | Admit: 2022-08-11 | Discharge: 2022-08-11 | Disposition: A | Discharge status: Home / Self Care | Payer: 59 | Source: Ambulatory Visit | Attending: Orthopaedic Surgery | Admitting: Orthopaedic Surgery

## 2022-08-11 DIAGNOSIS — G8929 Other chronic pain: Secondary | ICD-10-CM

## 2022-08-11 DIAGNOSIS — M5441 Lumbago with sciatica, right side: Secondary | ICD-10-CM | POA: Diagnosis present

## 2022-08-11 DIAGNOSIS — M5442 Lumbago with sciatica, left side: Secondary | ICD-10-CM | POA: Diagnosis not present

## 2022-08-12 ENCOUNTER — Telehealth: Payer: Self-pay | Admitting: Internal Medicine

## 2022-08-12 ENCOUNTER — Other Ambulatory Visit: Payer: Self-pay

## 2022-08-12 DIAGNOSIS — G8929 Other chronic pain: Secondary | ICD-10-CM

## 2022-08-12 DIAGNOSIS — M5137 Other intervertebral disc degeneration, lumbosacral region: Secondary | ICD-10-CM

## 2022-08-12 MED ORDER — TIZANIDINE HCL 4 MG PO TABS: 4.0000 mg | ORAL_TABLET | Freq: Four times a day (QID) | ORAL | 0 refills | Status: DC | PRN

## 2022-08-12 NOTE — Telephone Encounter (Signed)
Prescription Request  08/12/2022  Is this a "Controlled Substance" medicine? No  LOV: 07/11/2022  What is the name of the medication or equipment? tiZANidine (ZANAFLEX) 4 MG tablet [[id number]   Have you contacted your pharmacy to request a refill? No   Which pharmacy would you like this sent to?  [id number] - [place], [place] [address] [place] [address] Phone: [phone] Fax: [phone]    Patient notified that their request is being sent to the clinical staff for review and that they should receive a response within 2 business days.   Please advise at Mobile [phone] (mobile)

## 2022-08-13 ENCOUNTER — Telehealth (INDEPENDENT_AMBULATORY_CARE_PROVIDER_SITE_OTHER): Payer: 59 | Admitting: Psychiatry

## 2022-08-13 ENCOUNTER — Encounter (HOSPITAL_COMMUNITY): Payer: Self-pay | Admitting: Psychiatry

## 2022-08-13 DIAGNOSIS — G894 Chronic pain syndrome: Secondary | ICD-10-CM

## 2022-08-13 DIAGNOSIS — R2681 Unsteadiness on feet: Secondary | ICD-10-CM

## 2022-08-13 DIAGNOSIS — F410 Panic disorder [episodic paroxysmal anxiety] without agoraphobia: Secondary | ICD-10-CM

## 2022-08-13 DIAGNOSIS — F411 Generalized anxiety disorder: Secondary | ICD-10-CM | POA: Diagnosis not present

## 2022-08-13 DIAGNOSIS — F331 Major depressive disorder, recurrent, moderate: Secondary | ICD-10-CM | POA: Diagnosis not present

## 2022-08-13 DIAGNOSIS — F17200 Nicotine dependence, unspecified, uncomplicated: Secondary | ICD-10-CM

## 2022-08-13 DIAGNOSIS — F5105 Insomnia due to other mental disorder: Secondary | ICD-10-CM

## 2022-08-13 DIAGNOSIS — F1290 Cannabis use, unspecified, uncomplicated: Secondary | ICD-10-CM

## 2022-08-13 DIAGNOSIS — F17210 Nicotine dependence, cigarettes, uncomplicated: Secondary | ICD-10-CM

## 2022-08-13 DIAGNOSIS — M797 Fibromyalgia: Secondary | ICD-10-CM

## 2022-08-13 DIAGNOSIS — F603 Borderline personality disorder: Secondary | ICD-10-CM | POA: Diagnosis not present

## 2022-08-13 MED ORDER — ARIPIPRAZOLE 5 MG PO TABS: 5.0000 mg | ORAL_TABLET | Freq: Every day | ORAL | 1 refills | Status: DC

## 2022-08-13 NOTE — Patient Instructions (Addendum)
No medication changes today. We will recheck your ecg in about 2-3 months. Be sure to follow up with DBT referrals and try chair yoga for your back before progressing to yoga for sciatica.
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
## 2022-08-19 ENCOUNTER — Ambulatory Visit (INDEPENDENT_AMBULATORY_CARE_PROVIDER_SITE_OTHER): Payer: 59 | Admitting: Orthopaedic Surgery

## 2022-08-19 ENCOUNTER — Encounter: Payer: Self-pay | Admitting: Orthopaedic Surgery

## 2022-08-19 DIAGNOSIS — M5441 Lumbago with sciatica, right side: Secondary | ICD-10-CM | POA: Diagnosis not present

## 2022-08-19 DIAGNOSIS — M5442 Lumbago with sciatica, left side: Secondary | ICD-10-CM | POA: Diagnosis not present

## 2022-08-19 DIAGNOSIS — G8929 Other chronic pain: Secondary | ICD-10-CM | POA: Diagnosis not present

## 2022-08-19 NOTE — Progress Notes (Signed)
My back hurts.  She had the MRI of the lumbar spine showing: IMPRESSION: Mild L5-S1 facet arthrosis, which may serve as a source of local low back pain. Otherwise normal lumbar spine.   I informed her of the findings and gave her a copy of the MRI.  I have independently reviewed the MRI.     I have recommended consideration of epidural.  She says she has done this and it did not help.  She has had ablations of the back twice with no help.  She says she will prefer to do nothing now.  She says there is no need to come back.  Lower back is painful, ROM limited, NV intact, muscle tone and strength normal, uses cane.  Encounter Diagnosis  Name Primary?   Chronic bilateral low back pain with bilateral sciatica Yes   I will see her as needed per her request.  Call if any problem.  Precautions discussed.  Electronically Signed Sanjuana Kava, MD 1/23/20249:48 AM

## 2022-08-26 ENCOUNTER — Ambulatory Visit: Payer: Medicare Other | Admitting: Internal Medicine

## 2022-09-02 ENCOUNTER — Other Ambulatory Visit: Payer: Self-pay

## 2022-09-02 ENCOUNTER — Ambulatory Visit (INDEPENDENT_AMBULATORY_CARE_PROVIDER_SITE_OTHER): Payer: 59 | Admitting: Internal Medicine

## 2022-09-02 ENCOUNTER — Telehealth: Payer: Self-pay | Admitting: Internal Medicine

## 2022-09-02 ENCOUNTER — Encounter: Payer: Self-pay | Admitting: Internal Medicine

## 2022-09-02 VITALS — BP 148/83 | HR 75 | Ht 61.0 in | Wt 185.6 lb

## 2022-09-02 DIAGNOSIS — M51379 Other intervertebral disc degeneration, lumbosacral region without mention of lumbar back pain or lower extremity pain: Secondary | ICD-10-CM

## 2022-09-02 DIAGNOSIS — M5137 Other intervertebral disc degeneration, lumbosacral region: Secondary | ICD-10-CM

## 2022-09-02 DIAGNOSIS — G8929 Other chronic pain: Secondary | ICD-10-CM

## 2022-09-02 DIAGNOSIS — I10 Essential (primary) hypertension: Secondary | ICD-10-CM | POA: Diagnosis not present

## 2022-09-02 DIAGNOSIS — N3941 Urge incontinence: Secondary | ICD-10-CM

## 2022-09-02 DIAGNOSIS — M7701 Medial epicondylitis, right elbow: Secondary | ICD-10-CM | POA: Diagnosis not present

## 2022-09-02 MED ORDER — SPIRONOLACTONE 25 MG PO TABS: 12.5000 mg | ORAL_TABLET | Freq: Every day | ORAL | 2 refills | Status: DC

## 2022-09-02 MED ORDER — TIZANIDINE HCL 4 MG PO TABS: 4.0000 mg | ORAL_TABLET | Freq: Four times a day (QID) | ORAL | 0 refills | Status: DC | PRN

## 2022-09-02 MED ORDER — MIRABEGRON ER 25 MG PO TB24: 25.0000 mg | ORAL_TABLET | Freq: Every day | ORAL | 0 refills | Status: DC

## 2022-09-02 NOTE — Assessment & Plan Note (Signed)
She describes a 2-year history of urge incontinence that has worsened within the last 6 months.  She is currently wearing depends because of inability to make it to the bathroom when she feels the urge to void.  Kegel exercises have not improved her symptoms. -I have prescribed mirabegron 25 mg daily today -Follow-up in 4 weeks for reassessment

## 2022-09-02 NOTE — Telephone Encounter (Signed)
Refilled

## 2022-09-02 NOTE — Telephone Encounter (Signed)
Patient called need med refill  tiZANidine (ZANAFLEX) 4 MG tablet [[id number]]

## 2022-09-02 NOTE — Progress Notes (Signed)
Established Patient Office Visit  Subjective   Patient ID: [name], female    DOB: Apr 08, 1966  Age: 57 y.o. MRN: [id number]  Chief Complaint  Patient presents with   Hypertension    Follow up   Elbow Pain    Started 09/02/2021 but seems to be getting worse   [name] returns to care today for HTN follow-up.  She was last seen by me on 07/11/22 at which time Aldactone was discontinued due to hypotension.  Aldactone 25 mg daily had previously been prescribed in the setting of uncontrolled HTN.  4-week follow-up was arranged.  In the interim she has been seen by orthopedic surgery for evaluation of chronic back pain.  There have otherwise been no acute interval events.  [name] reports feeling fairly well today.  She has 2 additional concerns to discuss.  First she reports right medial elbow pain and has been present for the last 6 months but has significantly worsened over the last 2 weeks.  She currently is unable to brush her hair because of pain along the medial aspect of her right elbow.  Pain is worse with flexion/extension of the elbow.  She additionally reports a 2-year history of urge incontinence that has worsened within the last 6 months.  She has previously tried Kegel exercises with no symptomatic improvement.  Currently she wears depends because of inability to make it to the bathroom when she feels the urge to urinate.  Past Medical History:  Diagnosis Date   Acute pansinusitis 05/03/2019   Anxiety    Arthritis    "pretty much all over; mainly neck and back" (12/26/2015)   Asthma    Atypical squamous cell changes of undetermined significance (ASCUS) on vaginal cytology 04/11/2022   Bipolar 1 disorder (HCC)    Bipolar disorder ([place])    Bipolar I disorder, most recent episode depressed ([place]) 07/04/2014   Cervical cancer ([place])    "stage I"   Chronic back pain    Chronic bronchitis ([place])    Colon cancer screening 02/15/2018   COPD (chronic obstructive pulmonary  disease) ([place])    Degenerative disc disease    Depression    Dysrhythmia    Fibromyalgia    GERD (gastroesophageal reflux disease)    Hepatic cyst    High cholesterol    History of hiatal hernia    Hypertension 08/04/2012   IBS (irritable bowel syndrome) Diagnosed November 2012   Incisional hernia with gangrene and obstruction 12/26/2015   Melanoma of back ([place])    Ovarian cancer ([place])    "stage II"   Plantar fasciitis, bilateral    Preventative health care 04/11/2022   Sleep apnea    cpap coming  "soon" (12/26/2015)   Past Surgical History:  Procedure Laterality Date   [place]  2015   CARPAL TUNNEL RELEASE Right    [place]   COLONOSCOPY  November 2012   EYE SURGERY     CATARACTS REMOVED 09/09/17 and 09/16/17   HERNIA REPAIR     INCISIONAL HERNIA REPAIR N/A 12/26/2015   Procedure: LAPAROSCOPIC REPAIR INCISIONAL HERNIA WITH MESH;  Surgeon: Fanny Skates, MD;  Location: [facility];  Service: General;  Laterality: N/A;   INSERTION OF MESH N/A 12/26/2015   Procedure: INSERTION OF MESH;  Surgeon: Fanny Skates, MD;  Location: [place];  Service: General;  Laterality: N/A;   LAPAROSCOPIC  ABDOMINAL EXPLORATION  1997   LAPAROSCOPIC INCISIONAL / UMBILICAL / VENTRAL HERNIA REPAIR  12/26/2015   repair of incarcerated incisional hernia with mesh   LIVER CYST REMOVAL  2014   MELANOMA EXCISION  January 2013   removed from back   SHOULDER SURGERY Left 2010   Humeral Head Microfracture    TUBAL LIGATION     UPPER GASTROINTESTINAL ENDOSCOPY  November 2012   Social History   Tobacco Use   Smoking status: Every Day    Packs/day: 0.00    Years: 0.00    Total pack years: 0.00    Types: Cigarettes, E-cigarettes    Last attempt to quit: 06/12/2018    Years since quitting: 4.2   Smokeless tobacco: Current   Tobacco comments:    Smokes hemp every night to help with her pain  Vaping Use    Vaping Use: Every day   Start date: 04/18/2019  Substance Use Topics   Alcohol use: No    Comment: 12/26/2015 'quit in ~ 1997"   Drug use: Yes    Types: Other-see comments, Marijuana    Comment: 12/26/2015 "quit in ~  2010".   smokes CBD with 0.3% THC nightly   Family History  Adopted: Yes  Problem Relation Age of Onset   Bipolar disorder Mother        never diagnosed but patient suspects mother had bipolar disorder.Marland KitchenMarland KitchenMarland KitchenMarland Kitchenpt was adopeted, only knew birth mother for a short period of time.   Anxiety disorder Mother    Cirrhosis Mother    Diabetes Mother    Turner syndrome Daughter    Cancer Daughter    Anxiety disorder Daughter    Bipolar disorder Daughter    Interstitial cystitis Daughter    ADD / ADHD Neg Hx    Alcohol abuse Neg Hx    Drug abuse Neg Hx    Dementia Neg Hx    Depression Neg Hx    OCD Neg Hx    Paranoid behavior Neg Hx    Schizophrenia Neg Hx    Seizures Neg Hx    Sexual abuse Neg Hx    Physical abuse Neg Hx    Colon cancer Neg Hx    Allergies  Allergen Reactions   Barium Sulfate Nausea And Vomiting    Stomach cramps, extreme diarrhea, and vomiting   Barium-Containing Compounds Other (See Comments)    Stomach cramps, extreme diarrhea, and vomiting   Bee Venom Anaphylaxis   Haemophilus Influenzae Swelling    Trouble swallowing   Influenza Virus Vaccine Swelling    Trouble swallowing Trouble swallowing    Seldane [Terfenadine] Nausea And Vomiting and Other (See Comments)    CAUSES TOP LAYER OF SKIN TO PEEL   Sulfa Antibiotics Anaphylaxis   Amitriptyline     Other reaction(s): Other (See Comments) Mood instability   Lisinopril Cough   Lithium Other (See Comments)    Agitation and hostility   Tetracyclines & Related Hives and Nausea And Vomiting   Celebrex [Celecoxib] Nausea And Vomiting   Dexamethasone     Elevated blood sugar,nausea,diarrhea, muscle weakness   Metformin And Related Nausea And Vomiting   Prednisone     Other reaction(s):  Generalized Edema (intolerance)   Trazodone And Nefazodone     Sick   Zinc Nausea And Vomiting   Aspirin Rash and Other (See Comments)    Upset stomach   Ativan [Lorazepam] Other (See Comments)    Irritability   Erythromycin Nausea And Vomiting, Rash and Other (See  Comments)    Cramps   Lipitor [Atorvastatin] Nausea And Vomiting   Review of Systems  Gastrointestinal:        Urge incontinence  Musculoskeletal:  Positive for back pain (chronic) and joint pain (R medial elbow pain).  All other systems reviewed and are negative.    Objective:     BP (!) 148/83   Pulse 75   Ht '5\' 1"'$  (1.549 m)   Wt 185 lb 9.6 oz (84.2 kg)   SpO2 96%   BMI 35.07 kg/m  BP Readings from Last 3 Encounters:  09/02/22 (!) 148/83  07/15/22 (!) 160/75  07/11/22 (!) 96/53   Physical Exam Vitals reviewed.  Constitutional:      General: She is not in acute distress.    Appearance: Normal appearance. She is obese. She is not toxic-appearing.  HENT:     Head: Normocephalic and atraumatic.     Right Ear: External ear normal.     Left Ear: External ear normal.     Nose: Nose normal. No congestion or rhinorrhea.     Mouth/Throat:     Mouth: Mucous membranes are moist.     Pharynx: Oropharynx is clear. No oropharyngeal exudate or posterior oropharyngeal erythema.  Eyes:     General: No scleral icterus.    Extraocular Movements: Extraocular movements intact.     Conjunctiva/sclera: Conjunctivae normal.     Pupils: Pupils are equal, round, and reactive to light.  Cardiovascular:     Rate and Rhythm: Normal rate and regular rhythm.     Pulses: Normal pulses.     Heart sounds: Normal heart sounds. No murmur heard.    No friction rub. No gallop.  Pulmonary:     Effort: Pulmonary effort is normal.     Breath sounds: Normal breath sounds. No wheezing, rhonchi or rales.  Abdominal:     General: Abdomen is flat. Bowel sounds are normal. There is no distension.     Palpations: Abdomen is soft.      Tenderness: There is no abdominal tenderness.  Musculoskeletal:        General: Tenderness (TTP over R medial epicondyle) present. No swelling. Normal range of motion.     Cervical back: Normal range of motion.     Right lower leg: No edema.     Left lower leg: No edema.  Lymphadenopathy:     Cervical: No cervical adenopathy.  Skin:    General: Skin is warm and dry.     Capillary Refill: Capillary refill takes less than 2 seconds.     Coloration: Skin is not jaundiced.  Neurological:     General: No focal deficit present.     Mental Status: She is alert and oriented to person, place, and time.  Psychiatric:        Mood and Affect: Mood normal.        Behavior: Behavior normal.   Last CBC Lab Results  Component Value Date   WBC 10.6 04/09/2022   HGB 15.2 04/09/2022   HCT 46.2 04/09/2022   MCV 86 04/09/2022   MCH 28.3 04/09/2022   RDW 13.2 04/09/2022   PLT 358 92/05/9416   Last metabolic panel Lab Results  Component Value Date   GLUCOSE 94 04/09/2022   NA 142 04/09/2022   K 4.2 04/09/2022   CL 101 04/09/2022   CO2 21 04/09/2022   BUN 11 04/09/2022   CREATININE 0.82 04/09/2022   EGFR 83 04/09/2022   CALCIUM 10.3 (H) 04/09/2022  PROT 7.7 04/09/2022   ALBUMIN 4.9 04/09/2022   LABGLOB 2.8 04/09/2022   AGRATIO 1.8 04/09/2022   BILITOT 0.7 04/09/2022   ALKPHOS 90 04/09/2022   AST 28 04/09/2022   ALT 34 (H) 04/09/2022   ANIONGAP 8 12/08/2017   Last lipids Lab Results  Component Value Date   CHOL 150 07/08/2022   HDL 42 07/08/2022   LDLCALC 87 07/08/2022   TRIG 115 07/08/2022   CHOLHDL 3.6 07/08/2022   Last hemoglobin A1c Lab Results  Component Value Date   HGBA1C 5.7 (H) 04/09/2022   Last thyroid functions Lab Results  Component Value Date   TSH 1.110 04/09/2022   T3TOTAL 138.6 02/08/2014   Last vitamin D Lab Results  Component Value Date   VD25OH 25.0 (L) 04/09/2022   Last vitamin B12 and Folate Lab Results  Component Value Date   VITAMINB12  419 04/09/2022   FOLATE 6.6 04/09/2022   The 10-year ASCVD risk score (Arnett DK, et al., 2019) is: 9.4%    Assessment & Plan:   Problem List Items Addressed This Visit       Essential hypertension    She is currently prescribed amlodipine 10 mg daily and losartan-HCTZ 100-25 mg daily for treatment of hypertension.  Her blood pressure today is elevated, 151/88 initially and 148/83 on repeat.  She was previously prescribed for lactone 25 mg daily, however this was discontinued in the setting of hypotension. -Curious case as she has gone from uncontrolled HTN to borderline hypotensive and symptomatic on spironolactone 25 mg daily recently.  She is on maximum doses of her currently prescribed antihypertensive medications and BP remains above goal. -Through shared decision making, we will resume spironolactone 12.5 mg daily.  Follow-up in 4 weeks for HTN check      Medial epicondylitis of elbow, right    She endorses a 59-monthhistory of vague right elbow pain that has significantly worsened over the last 2 weeks.  Pain is mostly along the medial aspect of the right elbow.  She has tenderness palpation over the medial epicondyle on exam. -Treatment options reviewed.  She has been provided with home PT exercises for treatment of medial epicondylitis.  I recommended regular use of Voltaren gel.  She has no interest in steroid injections if her pain does not improve with conservative measures.      Urge incontinence - Primary    She describes a 2-year history of urge incontinence that has worsened within the last 6 months.  She is currently wearing depends because of inability to make it to the bathroom when she feels the urge to void.  Kegel exercises have not improved her symptoms. -I have prescribed mirabegron 25 mg daily today -Follow-up in 4 weeks for reassessment       Return in about 4 weeks (around 09/30/2022) for HTN.    PJohnette Abraham MD

## 2022-09-02 NOTE — Patient Instructions (Signed)
It was a pleasure to see you today.  Thank you for giving [place] the opportunity to be involved in your care.  Below is a brief recap of your visit and next steps.  We will plan to see you again in 4 weeks.  Summary Start spironolactone 12.5 mg daily for hypertension Please see the attached exercises for Golfer's elbow.  Start mirabegron 25 mg daily for urge incontinence  Follow up in 4 weeks

## 2022-09-02 NOTE — Assessment & Plan Note (Signed)
She is currently prescribed amlodipine 10 mg daily and losartan-HCTZ 100-25 mg daily for treatment of hypertension.  Her blood pressure today is elevated, 151/88 initially and 148/83 on repeat.  She was previously prescribed for lactone 25 mg daily, however this was discontinued in the setting of hypotension. -Curious case as she has gone from uncontrolled HTN to borderline hypotensive and symptomatic on spironolactone 25 mg daily recently.  She is on maximum doses of her currently prescribed antihypertensive medications and BP remains above goal. -Through shared decision making, we will resume spironolactone 12.5 mg daily.  Follow-up in 4 weeks for HTN check

## 2022-09-02 NOTE — Assessment & Plan Note (Signed)
She endorses a 10-monthhistory of vague right elbow pain that has significantly worsened over the last 2 weeks.  Pain is mostly along the medial aspect of the right elbow.  She has tenderness palpation over the medial epicondyle on exam. -Treatment options reviewed.  She has been provided with home PT exercises for treatment of medial epicondylitis.  I recommended regular use of Voltaren gel.  She has no interest in steroid injections if her pain does not improve with conservative measures.

## 2022-09-13 ENCOUNTER — Other Ambulatory Visit: Payer: Self-pay

## 2022-09-13 ENCOUNTER — Emergency Department (HOSPITAL_COMMUNITY)
Admission: EM | Admit: 2022-09-13 | Discharge: 2022-09-13 | Disposition: A | Discharge status: Home / Self Care | Payer: 59 | Attending: Emergency Medicine | Admitting: Emergency Medicine

## 2022-09-13 ENCOUNTER — Encounter (HOSPITAL_COMMUNITY): Payer: Self-pay | Admitting: Emergency Medicine

## 2022-09-13 DIAGNOSIS — R059 Cough, unspecified: Secondary | ICD-10-CM | POA: Diagnosis not present

## 2022-09-13 DIAGNOSIS — H60501 Unspecified acute noninfective otitis externa, right ear: Secondary | ICD-10-CM

## 2022-09-13 DIAGNOSIS — H9201 Otalgia, right ear: Secondary | ICD-10-CM | POA: Insufficient documentation

## 2022-09-13 DIAGNOSIS — Z79899 Other long term (current) drug therapy: Secondary | ICD-10-CM | POA: Diagnosis not present

## 2022-09-13 DIAGNOSIS — I10 Essential (primary) hypertension: Secondary | ICD-10-CM | POA: Diagnosis not present

## 2022-09-13 DIAGNOSIS — J029 Acute pharyngitis, unspecified: Secondary | ICD-10-CM | POA: Insufficient documentation

## 2022-09-13 LAB — GROUP A STREP BY PCR: Group A Strep by PCR: NOT DETECTED

## 2022-09-13 MED ORDER — OFLOXACIN 0.3 % OT SOLN: 10.0000 [drp] | Freq: Two times a day (BID) | OTIC | 0 refills | Status: DC

## 2022-09-13 MED ORDER — OFLOXACIN 0.3 % OT SOLN: 10.0000 [drp] | Freq: Every day | OTIC | 0 refills | Status: AC

## 2022-09-13 NOTE — Discharge Instructions (Addendum)
Test is negative, you likely have a virus, continue home care.  You also have after ear infection and prescribed drops for this, you can continue take over-the-counter medication as needed for discomfort, come back to the ER for new or worsening symptoms.

## 2022-09-13 NOTE — ED Provider Notes (Signed)
[place] EMERGENCY DEPARTMENT AT [facility] Provider Note   CSN: [id number] Arrival date & time: 09/13/22  1012     History  Chief Complaint  Patient presents with   Sore Throat    [name] is a 57 y.o. female.  Reports history of hypertension, prediabetes and postural.  She presents for 3 days of sore throat, mild cough and right ear pain.  She went to urgent care yesterday and had negative COVID flu and RSV swab.  She states they also swabbed her throat for strep but swab the roof of her mouth and she thinks it was an accurate and would like repeat swab.  She was prescribed Bromfed which she did not pick up due to cost.  Been drinking fluids and taking over-the-counter medications with mild relief.  Her cough is nonproductive.  She has not had a fever   Sore Throat       Home Medications Prior to Admission medications   Medication Sig Start Date End Date Taking? Authorizing Provider  albuterol (VENTOLIN HFA) 108 (90 Base) MCG/ACT inhaler Inhale 2 puffs into the lungs every 6 (six) hours as needed. 08/16/20   [provider]  amLODipine (NORVASC) 10 MG tablet Take 1 tablet (10 mg total) by mouth daily. 09/17/21   Particia Nearing, PA-C  ARIPiprazole (ABILIFY) 5 MG tablet Take 1 tablet (5 mg total) by mouth at bedtime. 08/13/22 10/12/22  Elsie Lincoln, MD  Cholecalciferol 50 MCG (2000 UT) CAPS Take 1 capsule by mouth daily.    [provider]  clobetasol ointment (TEMOVATE) 0.05 % Apply to affected area every night for 4 weeks, then every other day for 4 weeks and then twice a week for 4 weeks or until resolution. 06/23/22   Constant, Peggy, MD  EPINEPHrine 0.3 mg/0.3 mL IJ SOAJ injection Inject 0.3 mg into the muscle as needed for anaphylaxis. Inject into the muscle. 04/09/22   Billie Lade, MD  losartan-hydrochlorothiazide (HYZAAR) 100-25 MG tablet Take 1 tablet by mouth daily. 09/17/21   Particia Nearing, PA-C  mirabegron ER  (MYRBETRIQ) 25 MG TB24 tablet Take 1 tablet (25 mg total) by mouth daily. 09/02/22   Billie Lade, MD  rosuvastatin (CRESTOR) 10 MG tablet Take 1 tablet (10 mg total) by mouth daily. 04/11/22   Billie Lade, MD  spironolactone (ALDACTONE) 25 MG tablet Take 0.5 tablets (12.5 mg total) by mouth daily. 09/02/22 12/01/22  Billie Lade, MD  tiZANidine (ZANAFLEX) 4 MG tablet Take 1 tablet (4 mg total) by mouth every 6 (six) hours as needed for up to 60 doses for muscle spasms. 09/02/22   Billie Lade, MD      Allergies    Barium sulfate, Barium-containing compounds, Bee venom, Haemophilus influenzae, Influenza virus vaccine, Seldane [terfenadine], Sulfa antibiotics, Amitriptyline, Lisinopril, Lithium, Tetracyclines & related, Celebrex [celecoxib], Dexamethasone, Metformin and related, Prednisone, Trazodone and nefazodone, Zinc, Aspirin, Ativan [lorazepam], Erythromycin, and Lipitor [atorvastatin]    Review of Systems   Review of Systems  Physical Exam Updated Vital Signs BP (!) 162/138   Pulse 81   Temp 98.5 F (36.9 C) (Oral)   Resp 20   Ht 5\' 1"  (1.549 m)   Wt 82.1 kg   SpO2 98%   BMI 34.20 kg/m  Physical Exam Vitals and nursing note reviewed.  Constitutional:      General: She is not in acute distress.    Appearance: She is well-developed.  HENT:  Head: Normocephalic and atraumatic.     Right Ear: Tympanic membrane normal. No decreased hearing noted. Tenderness present. No drainage or swelling.     Left Ear: Tympanic membrane normal.     Nose: No congestion or rhinorrhea.     Mouth/Throat:     Mouth: Mucous membranes are moist.     Pharynx: Uvula midline. Posterior oropharyngeal erythema present. No oropharyngeal exudate or uvula swelling.     Tonsils: No tonsillar exudate or tonsillar abscesses. 1+ on the right. 1+ on the left.  Eyes:     Conjunctiva/sclera: Conjunctivae normal.  Cardiovascular:     Rate and Rhythm: Normal rate and regular rhythm.     Heart sounds:  No murmur heard. Pulmonary:     Effort: Pulmonary effort is normal. No respiratory distress.     Breath sounds: Normal breath sounds.  Abdominal:     Palpations: Abdomen is soft.     Tenderness: There is no abdominal tenderness.  Musculoskeletal:        General: No swelling.     Cervical back: Neck supple.  Skin:    General: Skin is warm and dry.     Capillary Refill: Capillary refill takes less than 2 seconds.  Neurological:     Mental Status: She is alert.  Psychiatric:        Mood and Affect: Mood normal.     ED Results / Procedures / Treatments   Labs (all labs ordered are listed, but only abnormal results are displayed) Labs Reviewed  GROUP A STREP BY PCR    EKG None  Radiology No results found.  Procedures Procedures    Medications Ordered in ED Medications - No data to display  ED Course/ Medical Decision Making/ A&P                             Medical Decision Making This patient presents to the ED for concern of throat and right ear pain, this involves an extensive number of treatment options, and is a complaint that carries with it a high risk of complications and morbidity.  The differential diagnosis includes pharyngitis, viral pharyngitis, peritonsillar abscess, URI, sinusitis, otitis media, otitis externa, other   Co morbidities that complicate the patient evaluation  Hypertension, prediabetes     Lab Tests:  I Ordered, and personally interpreted labs includingstrep test.  The pertinent results include: Negative strep swab     Problem List / ED Course / Critical interventions / Medication management  Sore throat with cough and runny nose, patient already had negative COVID flu and RSV but wants repeat strep swab.  Symptoms likely viral, advised on over-the-counter medication, supportive care follow-up and return precautions.  She does not have signs of peritonsillar abscess, no concern for retropharyngeal abscess.  Does have tenderness  with movement and palpation left ear canal likely mild otitis externa.  Discussed keeping the area dry and antibiotic drops.  Patient agreeable plan of care and discharge.   I have reviewed the patients home medicines and have made adjustments as needed      Risk Prescription drug management.           Final Clinical Impression(s) / ED Diagnoses Final diagnoses:  None    Rx / DC Orders ED Discharge Orders     None         Josem Kaufmann 09/13/22 1354    Gerhard Munch, MD 09/14/22 [phone]

## 2022-09-13 NOTE — ED Notes (Signed)
Pt seen at [facility] earlier this morning for sore throat and ear pain.

## 2022-09-13 NOTE — ED Triage Notes (Signed)
Patient c/o sore throat x 2 days ago with low grade fever. Patient states "feels like knot in my throat. Patient seen at Urgent Care. Per patient had Covid/flu/RSV swab done and strep. Patient states "They did my Covid swab correctly but they swabbed the roof of my mouth for the strep." Patient diagnosed with bronchitis and had prescripton called in for Bromfed in which insurance doesn't cover. Patient also c/o right ear pain.

## 2022-09-14 ENCOUNTER — Encounter (HOSPITAL_COMMUNITY): Payer: Self-pay

## 2022-09-14 ENCOUNTER — Other Ambulatory Visit: Payer: Self-pay

## 2022-09-14 ENCOUNTER — Emergency Department (HOSPITAL_COMMUNITY)
Admission: EM | Admit: 2022-09-14 | Discharge: 2022-09-14 | Disposition: A | Discharge status: Home / Self Care | Payer: 59 | Attending: Emergency Medicine | Admitting: Emergency Medicine

## 2022-09-14 DIAGNOSIS — H9202 Otalgia, left ear: Secondary | ICD-10-CM | POA: Diagnosis present

## 2022-09-14 DIAGNOSIS — H6690 Otitis media, unspecified, unspecified ear: Secondary | ICD-10-CM

## 2022-09-14 DIAGNOSIS — H6692 Otitis media, unspecified, left ear: Secondary | ICD-10-CM | POA: Insufficient documentation

## 2022-09-14 DIAGNOSIS — H9192 Unspecified hearing loss, left ear: Secondary | ICD-10-CM

## 2022-09-14 MED ORDER — AMOXICILLIN 500 MG PO CAPS: 500.0000 mg | ORAL_CAPSULE | Freq: Three times a day (TID) | ORAL | 0 refills | Status: DC

## 2022-09-14 MED ORDER — AMOXICILLIN 250 MG PO CAPS
500.0000 mg | ORAL_CAPSULE | Freq: Once | ORAL | Status: AC
  Administered 2022-09-14: 500 mg via ORAL
  Filled 2022-09-14: qty 2

## 2022-09-14 NOTE — ED Provider Notes (Addendum)
[facility] Provider Note   CSN: [id number] Arrival date & time: 09/14/22  2025     History  Chief Complaint  Patient presents with   Otalgia   HPI [name] is a 57 y.o. female presenting for otalgia.  States at 7:30 PM this evening she started to have a coughing fit, "heard a pop and everything went quiet" in her left ear.  States this is never happened to her before.  Denies tinnitus in the left ear.  States she has had tinnitus in the right ear before.  States she still cannot hear anything at this time.  Has had recent history of cough and sore throat.  Was evaluated here yesterday and found to be negative for COVID flu and RSV.  Also found to be negative for strep.   Otalgia      Home Medications Prior to Admission medications   Medication Sig Start Date End Date Taking? Authorizing Provider  amoxicillin (AMOXIL) 500 MG capsule Take 1 capsule (500 mg total) by mouth 3 (three) times daily. 09/14/22  Yes Harriet Pho, PA-C  albuterol (VENTOLIN HFA) 108 (90 Base) MCG/ACT inhaler Inhale 2 puffs into the lungs every 6 (six) hours as needed for wheezing or shortness of breath. 08/16/20   [provider]  amLODipine (NORVASC) 10 MG tablet Take 1 tablet (10 mg total) by mouth daily. 09/17/21   Volney American, PA-C  ARIPiprazole (ABILIFY) 5 MG tablet Take 1 tablet (5 mg total) by mouth at bedtime. 08/13/22 10/12/22  Jacquelynn Cree, MD  Cholecalciferol 50 MCG (2000 UT) CAPS Take 1 capsule by mouth daily.    [provider]  clobetasol ointment (TEMOVATE) 0.05 % Apply to affected area every night for 4 weeks, then every other day for 4 weeks and then twice a week for 4 weeks or until resolution. 06/23/22   Constant, Peggy, MD  EPINEPHrine 0.3 mg/0.3 mL IJ SOAJ injection Inject 0.3 mg into the muscle as needed for anaphylaxis. Inject into the muscle. 04/09/22   Johnette Abraham, MD  losartan-hydrochlorothiazide  (HYZAAR) 100-25 MG tablet Take 1 tablet by mouth daily. 09/17/21   Volney American, PA-C  mirabegron ER (MYRBETRIQ) 25 MG TB24 tablet Take 1 tablet (25 mg total) by mouth daily. 09/02/22   Johnette Abraham, MD  ofloxacin (FLOXIN) 0.3 % OTIC solution Place 10 drops into the right ear daily for 7 days. 09/13/22 09/20/22  Sherrye Payor A, PA-C  rosuvastatin (CRESTOR) 10 MG tablet Take 1 tablet (10 mg total) by mouth daily. 04/11/22   Johnette Abraham, MD  spironolactone (ALDACTONE) 25 MG tablet Take 0.5 tablets (12.5 mg total) by mouth daily. 09/02/22 12/01/22  Johnette Abraham, MD  tiZANidine (ZANAFLEX) 4 MG tablet Take 1 tablet (4 mg total) by mouth every 6 (six) hours as needed for up to 60 doses for muscle spasms. 09/02/22   Johnette Abraham, MD      Allergies    Barium sulfate, Barium-containing compounds, Bee venom, Haemophilus influenzae, Influenza virus vaccine, Seldane [terfenadine], Sulfa antibiotics, Amitriptyline, Lisinopril, Lithium, Tetracyclines & related, Celebrex [celecoxib], Dexamethasone, Metformin and related, Prednisone, Trazodone and nefazodone, Zinc, Aspirin, Ativan [lorazepam], Erythromycin, and Lipitor [atorvastatin]    Review of Systems   Review of Systems  HENT:  Positive for ear pain.     Physical Exam   Vitals:   09/14/22 2036 09/14/22 2041  BP:  (!) 170/86  Pulse:  81  Resp:  20  Temp:  98.5 F (36.9 C)  SpO2: 99% 93%    CONSTITUTIONAL:  well-appearing, NAD NEURO:  Alert and oriented x 3, CN 3-12 grossly intact EYES:  eyes equal and reactive ENT/NECK:  Supple, no stridor, left TM is bulging, erythematous with evidence of effusion, intact.  Right TM is erythematous and bulging without evidence of effusion.  No mastoid tenderness.  CARDIO: appears well-perfused  PULM:  No respiratory distress GI/GU:  non-distended MSK/SPINE:  No gross deformities, no edema, moves all extremities  SKIN:  no rash, atraumatic   *Additional and/or pertinent findings included in  MDM below  ED Results / Procedures / Treatments   Labs (all labs ordered are listed, but only abnormal results are displayed) Labs Reviewed - No data to display  EKG None  Radiology No results found.  Procedures Procedures    Medications Ordered in ED Medications  amoxicillin (AMOXIL) capsule 500 mg (500 mg Oral Given 09/14/22 2240)    ED Course/ Medical Decision Making/ A&P                             Medical Decision Making Risk Prescription drug management.   57 year old female who is well-appearing and hemodynamically stable presenting for otalgia.  Exam findings concerning for otitis media and the left ear and possibly in the right ear as well.  Treating with amoxicillin.  Sent the remaining course to her pharmacy.  Advised her to follow-up with ENT for acute hearing loss.  Discharged with stable vitals.        Final Clinical Impression(s) / ED Diagnoses Final diagnoses:  Acute otitis media, unspecified otitis media type  Hearing loss of left ear, unspecified hearing loss type    Rx / DC Orders ED Discharge Orders          Ordered    amoxicillin (AMOXIL) 500 MG capsule  3 times daily        09/14/22 2243                  Harriet Pho, PA-C 09/14/22 2245    Milton Ferguson, MD 09/16/22 1622

## 2022-09-14 NOTE — ED Triage Notes (Signed)
Complaining of not being able to hear on the left side after a coughing fit. She said it sounds like she is in a barrel.

## 2022-09-14 NOTE — Discharge Instructions (Addendum)
Evaluation for your ear pain revealed that you likely have an infection in your left ear and possibly in your right ear.  Treating with amoxicillin.  Advise you follow-up with ENT for your acute hearing loss.  I provided contact information for an ENT physician in [place].

## 2022-09-15 ENCOUNTER — Telehealth: Payer: Self-pay

## 2022-09-15 NOTE — Telephone Encounter (Signed)
Transition Care Management Unsuccessful Follow-up Telephone Call  Date of discharge and from where:  [facility]  Attempts:  1st Attempt  Reason for unsuccessful TCM follow-up call:  Left voice message

## 2022-09-25 ENCOUNTER — Encounter: Payer: Self-pay | Admitting: Radiology

## 2022-09-29 ENCOUNTER — Ambulatory Visit (INDEPENDENT_AMBULATORY_CARE_PROVIDER_SITE_OTHER): Payer: 59 | Admitting: Pulmonary Disease

## 2022-09-29 ENCOUNTER — Encounter: Payer: Self-pay | Admitting: Pulmonary Disease

## 2022-09-29 VITALS — BP 148/96 | HR 80 | Ht 61.0 in | Wt 182.8 lb

## 2022-09-29 DIAGNOSIS — J4520 Mild intermittent asthma, uncomplicated: Secondary | ICD-10-CM | POA: Diagnosis not present

## 2022-09-29 DIAGNOSIS — G4733 Obstructive sleep apnea (adult) (pediatric): Secondary | ICD-10-CM | POA: Diagnosis not present

## 2022-09-29 DIAGNOSIS — R0683 Snoring: Secondary | ICD-10-CM | POA: Diagnosis not present

## 2022-09-29 NOTE — Assessment & Plan Note (Signed)
Mild intermittent, using albuterol on an as-needed basis.
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
## 2022-09-29 NOTE — Patient Instructions (Signed)
X Home sleep test

## 2022-09-29 NOTE — Assessment & Plan Note (Signed)
OSA appears to be more severe than noted during sleep study which was only a mild degree. She seems to be very symptomatic and has definitely benefited from CPAP therapy. We will reassess with a home sleep test, weight is mostly unchanged.  Even if AHI is more than 5/hour, will proceed with CPAP with a fullface mask based on her request, she also request DME to be [employer]  The pathophysiology of obstructive sleep apnea , it's cardiovascular consequences & modes of treatment including CPAP were discused with the patient in detail & they evidenced understanding.

## 2022-09-30 ENCOUNTER — Telehealth (HOSPITAL_COMMUNITY): Payer: Self-pay | Admitting: *Deleted

## 2022-09-30 DIAGNOSIS — F410 Panic disorder [episodic paroxysmal anxiety] without agoraphobia: Secondary | ICD-10-CM

## 2022-09-30 DIAGNOSIS — F5105 Insomnia due to other mental disorder: Secondary | ICD-10-CM

## 2022-09-30 DIAGNOSIS — F603 Borderline personality disorder: Secondary | ICD-10-CM

## 2022-09-30 DIAGNOSIS — F331 Major depressive disorder, recurrent, moderate: Secondary | ICD-10-CM

## 2022-09-30 MED ORDER — ARIPIPRAZOLE 5 MG PO TABS: 5.0000 mg | ORAL_TABLET | Freq: Every day | ORAL | 1 refills | Status: DC

## 2022-09-30 NOTE — Telephone Encounter (Signed)
Patient called stating that she would like to cancel her appt and will call back to resch appt due to going out of town.  Staff asked patient for more details and she stated she is having to go to be with her daughter due to her son in law just passed away and don't know how long she'll be there to help care for her and her 60yrold daughter. Per pt, she just wanted to inform provider just in case she needs refills and just wanted to let provider know the situation as to what's going on. Patient then started crying really bad on the phone. Staff informed patient that message will be given to provider.   [phone]

## 2022-09-30 NOTE — Addendum Note (Signed)
Addended by: Debby Bud on: 09/30/2022 10:42 AM   Modules accepted: Orders

## 2022-09-30 NOTE — Telephone Encounter (Signed)
LMOM

## 2022-10-01 ENCOUNTER — Telehealth (HOSPITAL_COMMUNITY): Payer: 59 | Admitting: Psychiatry

## 2022-10-07 ENCOUNTER — Ambulatory Visit (INDEPENDENT_AMBULATORY_CARE_PROVIDER_SITE_OTHER): Payer: 59 | Admitting: Internal Medicine

## 2022-10-07 ENCOUNTER — Encounter: Payer: Self-pay | Admitting: Internal Medicine

## 2022-10-07 VITALS — BP 142/88 | HR 81 | Ht 61.0 in | Wt 183.2 lb

## 2022-10-07 DIAGNOSIS — M5441 Lumbago with sciatica, right side: Secondary | ICD-10-CM

## 2022-10-07 DIAGNOSIS — N3941 Urge incontinence: Secondary | ICD-10-CM

## 2022-10-07 DIAGNOSIS — M545 Low back pain, unspecified: Secondary | ICD-10-CM

## 2022-10-07 DIAGNOSIS — M5137 Other intervertebral disc degeneration, lumbosacral region: Secondary | ICD-10-CM | POA: Diagnosis not present

## 2022-10-07 DIAGNOSIS — G8929 Other chronic pain: Secondary | ICD-10-CM

## 2022-10-07 DIAGNOSIS — I10 Essential (primary) hypertension: Secondary | ICD-10-CM | POA: Diagnosis not present

## 2022-10-07 MED ORDER — MIRABEGRON ER 25 MG PO TB24: 25.0000 mg | ORAL_TABLET | Freq: Every day | ORAL | 2 refills | Status: DC

## 2022-10-07 MED ORDER — SPIRONOLACTONE 25 MG PO TABS: 25.0000 mg | ORAL_TABLET | Freq: Every day | ORAL | 2 refills | Status: DC

## 2022-10-07 MED ORDER — LOSARTAN POTASSIUM-HCTZ 100-25 MG PO TABS: 1.0000 | ORAL_TABLET | Freq: Every day | ORAL | 1 refills | Status: DC

## 2022-10-07 MED ORDER — TIZANIDINE HCL 4 MG PO TABS: 4.0000 mg | ORAL_TABLET | Freq: Four times a day (QID) | ORAL | 0 refills | Status: DC | PRN

## 2022-10-07 NOTE — Assessment & Plan Note (Signed)
Myrbetriq 25 mg daily was started at her last appointment for poorly controlled symptoms of urge incontinence.  She states that her symptoms have significantly improved with Myrbetriq. -No medication changes today.  Myrbetriq has been refilled.

## 2022-10-07 NOTE — Assessment & Plan Note (Signed)
Returning to care today for HTN follow-up.  Her blood pressure remains elevated, 175/92 initially and 142/88 on repeat.  She has been checking her blood pressure at home and reports readings consistently above goal (130/80).  Her current antihypertensive medication regimen consists of amlodipine 10 mg daily, losartan-HCTZ 100-25 mg daily, and spironolactone 12.5 mg daily. -Increase spironolactone to 25 mg daily -Follow-up in 4 weeks for HTN check.  Repeat BMP at that time.

## 2022-10-07 NOTE — Assessment & Plan Note (Signed)
She reports a recent fall, straining her lower back.  She has tenderness palpation over the paraspinal muscles of the lumbar spine bilaterally today.  No red flag symptoms identified. -Zanaflex has been refilled

## 2022-10-07 NOTE — Progress Notes (Signed)
Established Patient Office Visit  Subjective   Patient ID: [name], female    DOB: Apr 26, 1966  Age: 57 y.o. MRN: [id number]  Chief Complaint  Patient presents with   Hypertension    Four week follow up for hypertension.Patient states she slipped on the carpet the other day and threw her back out.   [name] returns here today for HTN follow-up.  She was last evaluated by me on 2/6 at which time spironolactone 12.5 mg daily was added for improved treatment of hypertension.  Myrbetriq 25 mg daily was also prescribed for treatment of urge incontinence.  In the interim she presented to the emergency department on 2/17 and 2/18 for sore throat and otalgia.  She has also been seen by sleep medicine for evaluation of OSA.  There have otherwise been no acute interval events.  Today [name] reports feeling fairly well.  She states that she recently slipped and fell backwards, straining her back.  She is currently managing her pain with Zanaflex.  She has no additional concerns to discuss today.  Past Medical History:  Diagnosis Date   Acute pansinusitis 05/03/2019   Anxiety    Arthritis    "pretty much all over; mainly neck and back" (12/26/2015)   Asthma    Atypical squamous cell changes of undetermined significance (ASCUS) on vaginal cytology 04/11/2022   Bipolar 1 disorder (HCC)    Bipolar disorder ([place])    Bipolar I disorder, most recent episode depressed ([place]) 07/04/2014   Cervical cancer ([place])    "stage I"   Chronic back pain    Chronic bronchitis ([place])    Colon cancer screening 02/15/2018   COPD (chronic obstructive pulmonary disease) ([place])    Degenerative disc disease    Depression    Dysrhythmia    Fibromyalgia    GERD (gastroesophageal reflux disease)    Hepatic cyst    High cholesterol    History of hiatal hernia    Hypertension 08/04/2012   IBS (irritable bowel syndrome) Diagnosed November 2012   Incisional hernia with gangrene and obstruction 12/26/2015    Melanoma of back ([place])    Ovarian cancer ([place])    "stage II"   Plantar fasciitis, bilateral    Preventative health care 04/11/2022   Sleep apnea    cpap coming  "soon" (12/26/2015)   Past Surgical History:  Procedure Laterality Date   [place]  2015   CARPAL TUNNEL RELEASE Right    [place]   COLONOSCOPY  November 2012   EYE SURGERY     CATARACTS REMOVED 09/09/17 and 09/16/17   HERNIA REPAIR     INCISIONAL HERNIA REPAIR N/A 12/26/2015   Procedure: LAPAROSCOPIC REPAIR INCISIONAL HERNIA WITH MESH;  Surgeon: Fanny Skates, MD;  Location: [facility];  Service: General;  Laterality: N/A;   INSERTION OF MESH N/A 12/26/2015   Procedure: INSERTION OF MESH;  Surgeon: Fanny Skates, MD;  Location: [facility];  Service: General;  Laterality: N/A;   [place] / UMBILICAL / [place]  12/26/2015   repair of incarcerated incisional hernia with mesh   LIVER CYST REMOVAL  2014   MELANOMA EXCISION  January 2013   removed from back   SHOULDER SURGERY Left 2010   Humeral Head Microfracture    TUBAL LIGATION     UPPER GASTROINTESTINAL ENDOSCOPY  November 2012   Social History   Tobacco Use   Smoking status: Former    Packs/day: 0.00    Years: 0.00    Total pack years: 0.00    Types: Cigarettes, E-cigarettes    Quit date: 06/12/2018    Years since quitting: 4.3   Smokeless tobacco: Never   Tobacco comments:    Smokes hemp every night to help with her pain  Vaping Use   Vaping Use: Every day   Start date: 04/18/2019   Substances: Nicotine-salt  Substance Use Topics   Alcohol use: No    Comment: 12/26/2015 'quit in ~ 1997"   Drug use: Yes    Types: Other-see comments, Marijuana    Comment: 12/26/2015 "quit in ~  2010".   smokes CBD with 0.3% THC nightly   Family History  Adopted: Yes  Problem Relation Age of Onset    Bipolar disorder Mother        never diagnosed but patient suspects mother had bipolar disorder.Marland KitchenMarland KitchenMarland KitchenMarland Kitchenpt was adopeted, only knew birth mother for a short period of time.   Anxiety disorder Mother    Cirrhosis Mother    Diabetes Mother    Turner syndrome Daughter    Cancer Daughter    Anxiety disorder Daughter    Bipolar disorder Daughter    Interstitial cystitis Daughter    ADD / ADHD Neg Hx    Alcohol abuse Neg Hx    Drug abuse Neg Hx    Dementia Neg Hx    Depression Neg Hx    OCD Neg Hx    Paranoid behavior Neg Hx    Schizophrenia Neg Hx    Seizures Neg Hx    Sexual abuse Neg Hx    Physical abuse Neg Hx    Colon cancer Neg Hx    Allergies  Allergen Reactions   Barium Sulfate Nausea And Vomiting    Stomach cramps, extreme diarrhea, and vomiting   Barium-Containing Compounds Other (See Comments)    Stomach cramps, extreme diarrhea, and vomiting   Bee Venom Anaphylaxis   Haemophilus Influenzae Swelling    Trouble swallowing   Influenza Virus Vaccine Swelling    Trouble swallowing Trouble swallowing    Seldane [Terfenadine] Nausea And Vomiting and Other (See Comments)    CAUSES TOP LAYER OF SKIN TO PEEL   Sulfa Antibiotics Anaphylaxis   Amitriptyline     Other reaction(s): Other (See Comments) Mood instability   Lisinopril Cough   Lithium Other (See Comments)    Agitation and hostility   Tetracyclines & Related Hives and Nausea And Vomiting   Celebrex [Celecoxib] Nausea And Vomiting   Dexamethasone     Elevated blood sugar,nausea,diarrhea, muscle weakness   Metformin And Related Nausea And Vomiting   Prednisone     Other reaction(s): Generalized Edema (intolerance)   Trazodone And Nefazodone     Sick   Zinc Nausea And Vomiting   Aspirin Rash and Other (See Comments)    Upset stomach   Ativan [Lorazepam] Other (See Comments)    Irritability   Erythromycin Nausea And Vomiting, Rash and Other (See Comments)    Cramps   Lipitor [Atorvastatin] Nausea And Vomiting    Review of Systems  Constitutional:  Negative for chills and fever.  HENT:  Negative for sore throat.   Respiratory:  Negative for cough and shortness of breath.   Cardiovascular:  Negative for chest pain, palpitations and leg swelling.  Gastrointestinal:  Negative for abdominal pain, blood in stool, constipation, diarrhea,  nausea and vomiting.  Genitourinary:  Negative for dysuria and hematuria.  Musculoskeletal:  Positive for back pain. Negative for myalgias.  Skin:  Negative for itching and rash.  Neurological:  Negative for dizziness and headaches.  Psychiatric/Behavioral:  Negative for depression and suicidal ideas.      Objective:     BP (!) 142/88   Pulse 81   Ht '5\' 1"'$  (1.549 m)   Wt 183 lb 3.2 oz (83.1 kg)   SpO2 96%   BMI 34.62 kg/m  BP Readings from Last 3 Encounters:  10/07/22 (!) 142/88  09/29/22 (!) 148/96  09/14/22 (!) 170/86   Physical Exam Vitals reviewed.  Constitutional:      General: She is not in acute distress.    Appearance: Normal appearance. She is obese. She is not toxic-appearing.  HENT:     Head: Normocephalic and atraumatic.     Right Ear: External ear normal.     Left Ear: External ear normal.     Nose: Nose normal. No congestion or rhinorrhea.     Mouth/Throat:     Mouth: Mucous membranes are moist.     Pharynx: Oropharynx is clear. No oropharyngeal exudate or posterior oropharyngeal erythema.  Eyes:     General: No scleral icterus.    Extraocular Movements: Extraocular movements intact.     Conjunctiva/sclera: Conjunctivae normal.     Pupils: Pupils are equal, round, and reactive to light.  Cardiovascular:     Rate and Rhythm: Normal rate and regular rhythm.     Pulses: Normal pulses.     Heart sounds: Normal heart sounds. No murmur heard.    No friction rub. No gallop.  Pulmonary:     Effort: Pulmonary effort is normal.     Breath sounds: Normal breath sounds. No wheezing, rhonchi or rales.  Abdominal:     General: Abdomen is  flat. Bowel sounds are normal. There is no distension.     Palpations: Abdomen is soft.     Tenderness: There is no abdominal tenderness.  Musculoskeletal:        General: Tenderness (TTP over paraspinal muscles of lumbar spine bilaterally) present. No swelling. Normal range of motion.     Cervical back: Normal range of motion.     Right lower leg: No edema.     Left lower leg: No edema.  Lymphadenopathy:     Cervical: No cervical adenopathy.  Skin:    General: Skin is warm and dry.     Capillary Refill: Capillary refill takes less than 2 seconds.     Coloration: Skin is not jaundiced.  Neurological:     General: No focal deficit present.     Mental Status: She is alert and oriented to person, place, and time.  Psychiatric:        Mood and Affect: Mood normal.        Behavior: Behavior normal.   Last CBC Lab Results  Component Value Date   WBC 10.6 04/09/2022   HGB 15.2 04/09/2022   HCT 46.2 04/09/2022   MCV 86 04/09/2022   MCH 28.3 04/09/2022   RDW 13.2 04/09/2022   PLT 358 [id number]   Last metabolic panel Lab Results  Component Value Date   GLUCOSE 94 04/09/2022   NA 142 04/09/2022   K 4.2 04/09/2022   CL 101 04/09/2022   CO2 21 04/09/2022   BUN 11 04/09/2022   CREATININE 0.82 04/09/2022   EGFR 83 04/09/2022   CALCIUM 10.3 (H) 04/09/2022  PROT 7.7 04/09/2022   ALBUMIN 4.9 04/09/2022   LABGLOB 2.8 04/09/2022   AGRATIO 1.8 04/09/2022   BILITOT 0.7 04/09/2022   ALKPHOS 90 04/09/2022   AST 28 04/09/2022   ALT 34 (H) 04/09/2022   ANIONGAP 8 12/08/2017   Last lipids Lab Results  Component Value Date   CHOL 150 07/08/2022   HDL 42 07/08/2022   LDLCALC 87 07/08/2022   TRIG 115 07/08/2022   CHOLHDL 3.6 07/08/2022   Last hemoglobin A1c Lab Results  Component Value Date   HGBA1C 5.7 (H) 04/09/2022   Last thyroid functions Lab Results  Component Value Date   TSH 1.110 04/09/2022   T3TOTAL 138.6 02/08/2014   Last vitamin D Lab Results  Component  Value Date   VD25OH 25.0 (L) 04/09/2022   Last vitamin B12 and Folate Lab Results  Component Value Date   VITAMINB12 419 04/09/2022   FOLATE 6.6 04/09/2022   The 10-year ASCVD risk score (Arnett DK, et al., 2019) is: 3.8%    Assessment & Plan:   Problem List Items Addressed This Visit       Essential hypertension    Returning to care today for HTN follow-up.  Her blood pressure remains elevated, 175/92 initially and 142/88 on repeat.  She has been checking her blood pressure at home and reports readings consistently above goal (130/80).  Her current antihypertensive medication regimen consists of amlodipine 10 mg daily, losartan-HCTZ 100-25 mg daily, and spironolactone 12.5 mg daily. -Increase spironolactone to 25 mg daily -Follow-up in 4 weeks for HTN check.  Repeat BMP at that time.      Urge incontinence    Myrbetriq 25 mg daily was started at her last appointment for poorly controlled symptoms of urge incontinence.  She states that her symptoms have significantly improved with Myrbetriq. -No medication changes today.  Myrbetriq has been refilled.      Acute lumbar back pain - Primary    She reports a recent fall, straining her lower back.  She has tenderness palpation over the paraspinal muscles of the lumbar spine bilaterally today.  No red flag symptoms identified. -Zanaflex has been refilled      Return in about 4 weeks (around 11/04/2022) for HTN.   Johnette Abraham, MD

## 2022-10-07 NOTE — Patient Instructions (Signed)
It was a pleasure to see you today.  Thank you for giving [place] the opportunity to be involved in your care.  Below is a brief recap of your visit and next steps.  We will plan to see you again in 4-6 weeks.  Summary Increase spironolactone to 25 mg daily Medications refilled Follow up in 4-6 weeks for BP check

## 2022-10-14 ENCOUNTER — Telehealth: Payer: Self-pay | Admitting: Internal Medicine

## 2022-10-14 NOTE — Telephone Encounter (Signed)
Spoke with pt and told her that if she doesn't start feeling better in a few days to give [place] a call back. Stated she thinks it just the stomach bug.

## 2022-10-14 NOTE — Telephone Encounter (Signed)
Pt called asking if nurse can give her a call? States she feels really bad.

## 2022-10-24 ENCOUNTER — Telehealth: Payer: Self-pay | Admitting: Internal Medicine

## 2022-10-24 MED ORDER — AMOXICILLIN 875 MG PO TABS: 875.0000 mg | ORAL_TABLET | Freq: Two times a day (BID) | ORAL | 0 refills | Status: AC

## 2022-10-24 NOTE — Telephone Encounter (Signed)
Pt called stating she has been coughing & has a metallic taste whwn coughing. It is making her gag. States she has not been coughing up any blood. Wants to know what to do?

## 2022-11-04 ENCOUNTER — Ambulatory Visit (INDEPENDENT_AMBULATORY_CARE_PROVIDER_SITE_OTHER): Payer: 59 | Admitting: Internal Medicine

## 2022-11-04 VITALS — BP 142/70 | HR 72 | Ht 61.0 in | Wt 179.2 lb

## 2022-11-04 DIAGNOSIS — M5441 Lumbago with sciatica, right side: Secondary | ICD-10-CM

## 2022-11-04 DIAGNOSIS — M51379 Other intervertebral disc degeneration, lumbosacral region without mention of lumbar back pain or lower extremity pain: Secondary | ICD-10-CM

## 2022-11-04 DIAGNOSIS — G8929 Other chronic pain: Secondary | ICD-10-CM

## 2022-11-04 DIAGNOSIS — M5137 Other intervertebral disc degeneration, lumbosacral region: Secondary | ICD-10-CM

## 2022-11-04 DIAGNOSIS — I10 Essential (primary) hypertension: Secondary | ICD-10-CM | POA: Diagnosis not present

## 2022-11-04 MED ORDER — SPIRONOLACTONE 50 MG PO TABS: 50.0000 mg | ORAL_TABLET | Freq: Every day | ORAL | 2 refills | Status: DC

## 2022-11-04 MED ORDER — TIZANIDINE HCL 4 MG PO TABS: 4.0000 mg | ORAL_TABLET | Freq: Four times a day (QID) | ORAL | 0 refills | Status: DC | PRN

## 2022-11-04 MED ORDER — ALBUTEROL SULFATE HFA 108 (90 BASE) MCG/ACT IN AERS: 2.0000 | INHALATION_SPRAY | Freq: Four times a day (QID) | RESPIRATORY_TRACT | 2 refills | Status: AC | PRN

## 2022-11-04 NOTE — Progress Notes (Signed)
Established Patient Office Visit  Subjective   Patient ID: [name], female    DOB: 1966/04/30  Age: 57 y.o. MRN: [id number]  Chief Complaint  Patient presents with   Hypertension    Follow up   [name] returns to care today for HTN follow-up.  She was last evaluated by me on 3/12 at which time her blood pressure remains significantly elevated.  Spironolactone was increased to 25 mg daily.  4-week follow-up was arranged.  There have been no acute interval events.  [name] continues to endorse low back pain today but is otherwise asymptomatic.  She has no additional concerns to discuss.  Past Medical History:  Diagnosis Date   Acute pansinusitis 05/03/2019   Anxiety    Arthritis    "pretty much all over; mainly neck and back" (12/26/2015)   Asthma    Atypical squamous cell changes of undetermined significance (ASCUS) on vaginal cytology 04/11/2022   Bipolar 1 disorder    Bipolar disorder    Bipolar I disorder, most recent episode depressed 07/04/2014   Cervical cancer    "stage I"   Chronic back pain    Chronic bronchitis    Colon cancer screening 02/15/2018   COPD (chronic obstructive pulmonary disease)    Degenerative disc disease    Depression    Dysrhythmia    Fibromyalgia    GERD (gastroesophageal reflux disease)    Hepatic cyst    High cholesterol    History of hiatal hernia    Hypertension 08/04/2012   IBS (irritable bowel syndrome) Diagnosed November 2012   Incisional hernia with gangrene and obstruction 12/26/2015   Melanoma of back    Ovarian cancer    "stage II"   Plantar fasciitis, bilateral    Preventative health care 04/11/2022   Sleep apnea    cpap coming  "soon" (12/26/2015)   Past Surgical History:  Procedure Laterality Date   ABDOMINAL HYSTERECTOMY  1997   APPENDECTOMY     CARDIAC CATHETERIZATION  2015   CARPAL TUNNEL RELEASE Right    CESAREAN SECTION  1987   CHOLECYSTECTOMY OPEN  1998   COLONOSCOPY  November 2012   EYE SURGERY      CATARACTS REMOVED 09/09/17 and 09/16/17   HERNIA REPAIR     INCISIONAL HERNIA REPAIR N/A 12/26/2015   Procedure: LAPAROSCOPIC REPAIR INCISIONAL HERNIA WITH MESH;  Surgeon: Claud Kelp, MD;  Location: [facility];  Service: General;  Laterality: N/A;   INSERTION OF MESH N/A 12/26/2015   Procedure: INSERTION OF MESH;  Surgeon: Claud Kelp, MD;  Location: [facility];  Service: General;  Laterality: N/A;   LAPAROSCOPIC ABDOMINAL EXPLORATION  1997   LAPAROSCOPIC INCISIONAL / UMBILICAL / VENTRAL HERNIA REPAIR  12/26/2015   repair of incarcerated incisional hernia with mesh   LIVER CYST REMOVAL  2014   MELANOMA EXCISION  January 2013   removed from back   SHOULDER SURGERY Left 2010   Humeral Head Microfracture    TUBAL LIGATION     UPPER GASTROINTESTINAL ENDOSCOPY  November 2012   Social History   Tobacco Use   Smoking status: Former    Packs/day: 0.00    Years: 0.00    Additional pack years: 0.00    Total pack years: 0.00    Types: Cigarettes, E-cigarettes    Quit date: 06/12/2018    Years since quitting: 4.4   Smokeless tobacco: Never   Tobacco comments:    Smokes hemp every night to help with her pain  Vaping Use   Vaping Use: Every day   Start date: 04/18/2019   Substances: Nicotine-salt  Substance Use Topics   Alcohol use: No    Comment: 12/26/2015 'quit in ~ 1997"   Drug use: Yes    Types: Other-see comments, Marijuana    Comment: 12/26/2015 "quit in ~  2010".   smokes CBD with 0.3% THC nightly   Family History  Adopted: Yes  Problem Relation Age of Onset   Bipolar disorder Mother        never diagnosed but patient suspects mother had bipolar disorder.Marland KitchenMarland KitchenMarland KitchenMarland Kitchenpt was adopeted, only knew birth mother for a short period of time.   Anxiety disorder Mother    Cirrhosis Mother    Diabetes Mother    Turner syndrome Daughter    Cancer Daughter    Anxiety disorder Daughter    Bipolar disorder Daughter    Interstitial cystitis Daughter    ADD / ADHD Neg Hx    Alcohol abuse Neg Hx     Drug abuse Neg Hx    Dementia Neg Hx    Depression Neg Hx    OCD Neg Hx    Paranoid behavior Neg Hx    Schizophrenia Neg Hx    Seizures Neg Hx    Sexual abuse Neg Hx    Physical abuse Neg Hx    Colon cancer Neg Hx    Allergies  Allergen Reactions   Barium Sulfate Nausea And Vomiting    Stomach cramps, extreme diarrhea, and vomiting   Barium-Containing Compounds Other (See Comments)    Stomach cramps, extreme diarrhea, and vomiting   Bee Venom Anaphylaxis   Haemophilus Influenzae Swelling    Trouble swallowing   Influenza Virus Vaccine Swelling    Trouble swallowing Trouble swallowing    Seldane [Terfenadine] Nausea And Vomiting and Other (See Comments)    CAUSES TOP LAYER OF SKIN TO PEEL   Sulfa Antibiotics Anaphylaxis   Amitriptyline     Other reaction(s): Other (See Comments) Mood instability   Lisinopril Cough   Lithium Other (See Comments)    Agitation and hostility   Tetracyclines & Related Hives and Nausea And Vomiting   Celebrex [Celecoxib] Nausea And Vomiting   Dexamethasone     Elevated blood sugar,nausea,diarrhea, muscle weakness   Metformin And Related Nausea And Vomiting   Prednisone     Other reaction(s): Generalized Edema (intolerance)   Trazodone And Nefazodone     Sick   Zinc Nausea And Vomiting   Aspirin Rash and Other (See Comments)    Upset stomach   Ativan [Lorazepam] Other (See Comments)    Irritability   Erythromycin Nausea And Vomiting, Rash and Other (See Comments)    Cramps   Lipitor [Atorvastatin] Nausea And Vomiting   Review of Systems  Musculoskeletal:  Positive for back pain (Lumbar back pain).  All other systems reviewed and are negative.    Objective:     BP (!) 142/70   Pulse 72   Ht 5\' 1"  (1.549 m)   Wt 179 lb 3.2 oz (81.3 kg)   SpO2 96%   BMI 33.86 kg/m  BP Readings from Last 3 Encounters:  11/04/22 (!) 142/70  10/07/22 (!) 142/88  09/29/22 (!) 148/96   Physical Exam Vitals reviewed.  Constitutional:       General: She is not in acute distress.    Appearance: Normal appearance. She is obese. She is not toxic-appearing.  HENT:     Head: Normocephalic and atraumatic.  Right Ear: External ear normal.     Left Ear: External ear normal.     Nose: Nose normal. No congestion or rhinorrhea.     Mouth/Throat:     Mouth: Mucous membranes are moist.     Pharynx: Oropharynx is clear. No oropharyngeal exudate or posterior oropharyngeal erythema.  Eyes:     General: No scleral icterus.    Extraocular Movements: Extraocular movements intact.     Conjunctiva/sclera: Conjunctivae normal.     Pupils: Pupils are equal, round, and reactive to light.  Cardiovascular:     Rate and Rhythm: Normal rate and regular rhythm.     Pulses: Normal pulses.     Heart sounds: Normal heart sounds. No murmur heard.    No friction rub. No gallop.  Pulmonary:     Effort: Pulmonary effort is normal.     Breath sounds: Normal breath sounds. No wheezing, rhonchi or rales.  Abdominal:     General: Abdomen is flat. Bowel sounds are normal. There is no distension.     Palpations: Abdomen is soft.     Tenderness: There is no abdominal tenderness.  Musculoskeletal:        General: No swelling. Normal range of motion.     Cervical back: Normal range of motion.     Right lower leg: No edema.     Left lower leg: No edema.  Lymphadenopathy:     Cervical: No cervical adenopathy.  Skin:    General: Skin is warm and dry.     Capillary Refill: Capillary refill takes less than 2 seconds.     Coloration: Skin is not jaundiced.  Neurological:     General: No focal deficit present.     Mental Status: She is alert and oriented to person, place, and time.  Psychiatric:        Mood and Affect: Mood normal.        Behavior: Behavior normal.   Last CBC Lab Results  Component Value Date   WBC 10.6 04/09/2022   HGB 15.2 04/09/2022   HCT 46.2 04/09/2022   MCV 86 04/09/2022   MCH 28.3 04/09/2022   RDW 13.2 04/09/2022   PLT 358  04/09/2022   Last metabolic panel Lab Results  Component Value Date   GLUCOSE 96 11/04/2022   NA 141 11/04/2022   K 4.0 11/04/2022   CL 102 11/04/2022   CO2 21 11/04/2022   BUN 9 11/04/2022   CREATININE 0.82 11/04/2022   EGFR 83 11/04/2022   CALCIUM 9.9 11/04/2022   PROT 7.7 04/09/2022   ALBUMIN 4.9 04/09/2022   LABGLOB 2.8 04/09/2022   AGRATIO 1.8 04/09/2022   BILITOT 0.7 04/09/2022   ALKPHOS 90 04/09/2022   AST 28 04/09/2022   ALT 34 (H) 04/09/2022   ANIONGAP 8 12/08/2017   Last lipids Lab Results  Component Value Date   CHOL 150 07/08/2022   HDL 42 07/08/2022   LDLCALC 87 07/08/2022   TRIG 115 07/08/2022   CHOLHDL 3.6 07/08/2022   Last hemoglobin A1c Lab Results  Component Value Date   HGBA1C 5.7 (H) 04/09/2022   Last thyroid functions Lab Results  Component Value Date   TSH 1.110 04/09/2022   T3TOTAL 138.6 02/08/2014   Last vitamin D Lab Results  Component Value Date   VD25OH 25.0 (L) 04/09/2022   Last vitamin B12 and Folate Lab Results  Component Value Date   VITAMINB12 419 04/09/2022   FOLATE 6.6 04/09/2022   The 10-year ASCVD risk score (  Arnett DK, et al., 2019) is: 3.8%    Assessment & Plan:   Problem List Items Addressed This Visit       Essential hypertension - Primary    Presenting today for HTN follow-up.  BP remains elevated and essentially unchanged, 164/85 initially, 154/95, and 142/70.  Spironolactone was increased to 25 mg daily at her last appointment.  She is additionally prescribed amlodipine 10 mg daily and losartan-HCTZ 100-25 mg daily.  She endorses compliance with her current antihypertensive regimen. -Increase spironolactone to 50 mg daily -Repeat BMP ordered today -Start assessing for secondary causes of HTN.  Renal artery US ordered today -Consider referral to advanced hypertension clinic if BP does not improve with today's adjustments -Follow-up in 4 weeks for HTN check.      Return in about 4 weeks (around 12/02/2022)  for HTN.   Billie LadePhillip E Zyan Coby, MD

## 2022-11-04 NOTE — Patient Instructions (Signed)
It was a pleasure to see you today.  Thank you for giving [place] the opportunity to be involved in your care.  Below is a brief recap of your visit and next steps.  We will plan to see you again in 4 weeks.  Summary Increase spironolactone to 50 mg daily Check chemistry panel Check renal artery ultrasound Follow up in 4 weeks for BP check

## 2022-11-05 LAB — BASIC METABOLIC PANEL
BUN/Creatinine Ratio: 11 (ref 9–23)
BUN: 9 mg/dL (ref 6–24)
CO2: 21 mmol/L (ref 20–29)
Calcium: 9.9 mg/dL (ref 8.7–10.2)
Chloride: 102 mmol/L (ref 96–106)
Creatinine, Ser: 0.82 mg/dL (ref 0.57–1.00)
Glucose: 96 mg/dL (ref 70–99)
Potassium: 4 mmol/L (ref 3.5–5.2)
Sodium: 141 mmol/L (ref 134–144)
eGFR: 83 mL/min/{1.73_m2} (ref >59)

## 2022-11-10 NOTE — Assessment & Plan Note (Signed)
Presenting today for HTN follow-up.  BP remains elevated and essentially unchanged, 164/85 initially, 154/95, and 142/70.  Spironolactone was increased to 25 mg daily at her last appointment.  She is additionally prescribed amlodipine 10 mg daily and losartan-HCTZ 100-25 mg daily.  She endorses compliance with her current antihypertensive regimen. -Increase spironolactone to 50 mg daily -Repeat BMP ordered today -Start assessing for secondary causes of HTN.  Renal artery [place] ordered today -Consider referral to advanced hypertension clinic if BP does not improve with today's adjustments.

## 2022-11-17 ENCOUNTER — Ambulatory Visit (INDEPENDENT_AMBULATORY_CARE_PROVIDER_SITE_OTHER): Payer: 59 | Admitting: Internal Medicine

## 2022-11-17 ENCOUNTER — Encounter: Payer: Self-pay | Admitting: Internal Medicine

## 2022-11-17 VITALS — BP 184/110 | HR 72 | Resp 16 | Ht 61.0 in | Wt 180.1 lb

## 2022-11-17 DIAGNOSIS — L299 Pruritus, unspecified: Secondary | ICD-10-CM | POA: Diagnosis not present

## 2022-11-17 MED ORDER — CETIRIZINE HCL 10 MG PO TABS: 10.0000 mg | ORAL_TABLET | Freq: Every day | ORAL | 1 refills | Status: DC

## 2022-11-17 NOTE — Progress Notes (Unsigned)
   HPI:[name] is a 57 y.o. female who presents for evaluation of all over itching. For the details of today's visit, please refer to the assessment and plan.  Physical Exam: Vitals:   11/17/22 1327 11/17/22 1349  BP: (!) 187/101 (!) 184/110  Pulse: 72   Resp: 16   SpO2: 97%   Weight: 180 lb 1.9 oz (81.7 kg)   Height:  (1.549 m)      Physical Exam Constitutional:      General: She is not in acute distress.    Appearance: She is not ill-appearing.  Cardiovascular:     Rate and Rhythm: Normal rate and regular rhythm.  Pulmonary:     Effort: Pulmonary effort is normal. No respiratory distress.     Breath sounds: No wheezing.  Skin:    General: Skin is dry.     Findings: No erythema, lesion or rash.      Assessment & Plan:   Lashuna was seen today for all over itching .  Pruritus Assessment & Plan: She been severely itching all over for a week from her head to her feet. No visible rash. Her armpits feel raw but they itch. Has tried anti itch cream and nothing is helping. She did not take her blood pressure medications today incase it was one of those causing it. She has not new medications. Has a long list of allergies. No changes in laundry detergents , new pets, or any other new changes at home patient can think of. She had been taking multiple salt baths a day last month to help with chronic back pain. She has not fever, weight loss, or night sweats. No recent travel history . Her husband does not have similar symptoms.   Patient has generalized pruritus without a primary skin lesion Will check CBC for evidence of myeloproliferate disease or eosinophilia.  CMP to look for liver disease Patient has dry skin and I recommended skin care to minimize dryness.  She has extreme swelling with steroids previously, recommend starting Zyrtec. Can use benadryl at night if pruritus is uncontrolled Follow up if symptoms worsen or fail to improve   Orders: -     CBC  with Differential/Platelet -     CMP14+EGFR -     Cetirizine HCl; Take 1 tablet (10 mg total) by mouth daily.  Dispense: 30 tablet; Refill: 1      Milus Banister, MD

## 2022-11-17 NOTE — Patient Instructions (Addendum)
Thank you, [name] for allowing [place] to provide your care today.   I have ordered the following labs for you:   Lab Orders         CBC with Differential         CMP14+EGFR        Reminders: Take Zyrtec daily. Moisturize skin daily. Follow up if not improving.     Thurmon Fair, M.D.

## 2022-11-18 DIAGNOSIS — L299 Pruritus, unspecified: Secondary | ICD-10-CM | POA: Insufficient documentation

## 2022-11-18 LAB — CMP14+EGFR
ALT: 26 IU/L (ref 0–32)
AST: 23 IU/L (ref 0–40)
Albumin/Globulin Ratio: 1.6 (ref 1.2–2.2)
Albumin: 4.3 g/dL (ref 3.8–4.9)
Alkaline Phosphatase: 90 IU/L (ref 44–121)
BUN/Creatinine Ratio: 16 (ref 9–23)
BUN: 12 mg/dL (ref 6–24)
Bilirubin Total: 0.7 mg/dL (ref 0.0–1.2)
CO2: 22 mmol/L (ref 20–29)
Calcium: 9.1 mg/dL (ref 8.7–10.2)
Chloride: 101 mmol/L (ref 96–106)
Creatinine, Ser: 0.76 mg/dL (ref 0.57–1.00)
Globulin, Total: 2.7 g/dL (ref 1.5–4.5)
Glucose: 86 mg/dL (ref 70–99)
Potassium: 3.8 mmol/L (ref 3.5–5.2)
Sodium: 141 mmol/L (ref 134–144)
Total Protein: 7 g/dL (ref 6.0–8.5)
eGFR: 91 mL/min/{1.73_m2} (ref >59)

## 2022-11-18 LAB — CBC WITH DIFFERENTIAL/PLATELET
Basophils Absolute: 0.1 10*3/uL (ref 0.0–0.2)
Basos: 1 %
EOS (ABSOLUTE): 0.7 10*3/uL — ABNORMAL HIGH (ref 0.0–0.4)
Eos: 8 %
Hematocrit: 43.5 % (ref 34.0–46.6)
Hemoglobin: 14.3 g/dL (ref 11.1–15.9)
Immature Grans (Abs): 0 10*3/uL (ref 0.0–0.1)
Immature Granulocytes: 1 %
Lymphocytes Absolute: 2.3 10*3/uL (ref 0.7–3.1)
Lymphs: 28 %
MCH: 28.3 pg (ref 26.6–33.0)
MCHC: 32.9 g/dL (ref 31.5–35.7)
MCV: 86 fL (ref 79–97)
Monocytes Absolute: 0.6 10*3/uL (ref 0.1–0.9)
Monocytes: 7 %
Neutrophils Absolute: 4.7 10*3/uL (ref 1.4–7.0)
Neutrophils: 55 %
Platelets: 318 10*3/uL (ref 150–450)
RBC: 5.06 x10E6/uL (ref 3.77–5.28)
RDW: 13.1 % (ref 11.7–15.4)
WBC: 8.5 10*3/uL (ref 3.4–10.8)

## 2022-11-18 NOTE — Assessment & Plan Note (Signed)
She been severely itching all over for a week from her head to her feet. No visible rash. Her armpits feel raw but they itch. Has tried anti itch cream and nothing is helping. She did not take her blood pressure medications today incase it was one of those causing it. She has not new medications. Has a long list of allergies. No changes in laundry detergents , new pets, or any other new changes at home patient can think of. She had been taking multiple salt baths a day last month to help with chronic back pain. She has not fever, weight loss, or night sweats. No recent travel history . Her husband does not have similar symptoms.   Patient has generalized pruritus without a primary skin lesion Will check CBC for evidence of myeloproliferate disease or eosinophilia.  CMP to look for liver disease Patient has dry skin and I recommended skin care to minimize dryness.  She has extreme swelling with steroids previously, recommend starting Zyrtec. Can use benadryl at night if pruritus is uncontrolled Follow up if symptoms worsen or fail to improve

## 2022-11-27 ENCOUNTER — Ambulatory Visit: Payer: 59 | Admitting: Internal Medicine

## 2022-12-02 ENCOUNTER — Ambulatory Visit (INDEPENDENT_AMBULATORY_CARE_PROVIDER_SITE_OTHER): Payer: 59 | Admitting: Internal Medicine

## 2022-12-02 ENCOUNTER — Encounter: Payer: Self-pay | Admitting: Internal Medicine

## 2022-12-02 ENCOUNTER — Other Ambulatory Visit: Payer: Self-pay

## 2022-12-02 ENCOUNTER — Telehealth: Payer: Self-pay | Admitting: Internal Medicine

## 2022-12-02 VITALS — BP 128/74 | HR 80 | Ht 61.0 in | Wt 180.6 lb

## 2022-12-02 DIAGNOSIS — L299 Pruritus, unspecified: Secondary | ICD-10-CM

## 2022-12-02 DIAGNOSIS — I10 Essential (primary) hypertension: Secondary | ICD-10-CM

## 2022-12-02 DIAGNOSIS — G8929 Other chronic pain: Secondary | ICD-10-CM

## 2022-12-02 DIAGNOSIS — M5137 Other intervertebral disc degeneration, lumbosacral region: Secondary | ICD-10-CM

## 2022-12-02 MED ORDER — HYDROXYZINE PAMOATE 25 MG PO CAPS: 25.0000 mg | ORAL_CAPSULE | Freq: Three times a day (TID) | ORAL | 0 refills | Status: DC | PRN

## 2022-12-02 MED ORDER — TIZANIDINE HCL 4 MG PO TABS
4.0000 mg | ORAL_TABLET | Freq: Four times a day (QID) | ORAL | 0 refills | Status: DC | PRN
Start: 2022-12-02 — End: 2023-01-05

## 2022-12-02 NOTE — Patient Instructions (Signed)
It was a pleasure to see you today.  Thank you for giving [place] the opportunity to be involved in your care.  Below is a brief recap of your visit and next steps.  We will plan to see you again in 3 months.  Summary Try hydroxyzine for itch relief No change to blood pressure medications We will work on getting your ultrasound scheduled

## 2022-12-02 NOTE — Telephone Encounter (Signed)
Patient need refill tiZANidine (ZANAFLEX) 4 MG tablet [[id number]]  Pharmacy  [id number] - [place], [place] - [address], [place] [address] Phone: [phone]  Fax: [phone] DEA #: [id number]  DAW Reason: --

## 2022-12-02 NOTE — Progress Notes (Unsigned)
Established Patient Office Visit  Subjective   Patient ID: [name], female    DOB: January 08, 1966  Age: 57 y.o. MRN: [id number]  Chief Complaint  Patient presents with   Hypertension    Follow up   Rash    Rash/itching on upper body. Started on 11/02/22. Derm gave ointment but still not helping.   [name] returns to care today for HTN follow-up.  Last evaluated by me on 4/9 at which time spironolactone was increased to 50 mg daily.  Renal artery [place] was also ordered to evaluate for secondary causes of hypertension.  In the interim she presented to [facility] on 4/22 for an acute viisit for evaluation of pruritus.  Daily antihistamines were recommended.  There have otherwise been no acute interval events.  [name] states that she has establish care with dermatology for evaluation of pruritus.  She was prescribed topical steroid cream, which has been effective in resolving her rash, however she still experiences generalized itching.  She is interested in additional treatment options today.  She otherwise has no acute concerns to discuss.  Past Medical History:  Diagnosis Date   Acute pansinusitis 05/03/2019   Anxiety    Arthritis    "pretty much all over; mainly neck and back" (12/26/2015)   Asthma    Atypical squamous cell changes of undetermined significance (ASCUS) on vaginal cytology 04/11/2022   Bipolar 1 disorder (HCC)    Bipolar disorder (HCC)    Bipolar I disorder, most recent episode depressed (HCC) 07/04/2014   Cervical cancer (HCC)    "stage I"   Chronic back pain    Chronic bronchitis (HCC)    Colon cancer screening 02/15/2018   COPD (chronic obstructive pulmonary disease) (HCC)    Degenerative disc disease    Depression    Dysrhythmia    Fibromyalgia    GERD (gastroesophageal reflux disease)    Hepatic cyst    High cholesterol    History of hiatal hernia    Hypertension 08/04/2012   IBS (irritable bowel syndrome) Diagnosed November 2012   Incisional hernia with  gangrene and obstruction 12/26/2015   Melanoma of back (HCC)    Ovarian cancer (HCC)    "stage II"   Plantar fasciitis, bilateral    Preventative health care 04/11/2022   Sleep apnea    cpap coming  "soon" (12/26/2015)   Past Surgical History:  Procedure Laterality Date   ABDOMINAL HYSTERECTOMY  1997   APPENDECTOMY     CARDIAC CATHETERIZATION  2015   CARPAL TUNNEL RELEASE Right    CESAREAN SECTION  1987   CHOLECYSTECTOMY OPEN  1998   COLONOSCOPY  November 2012   EYE SURGERY     CATARACTS REMOVED 09/09/17 and 09/16/17   HERNIA REPAIR     INCISIONAL HERNIA REPAIR N/A 12/26/2015   Procedure: LAPAROSCOPIC REPAIR INCISIONAL HERNIA WITH MESH;  Surgeon: Claud Kelp, MD;  Location: [facility] OR;  Service: General;  Laterality: N/A;   INSERTION OF MESH N/A 12/26/2015   Procedure: INSERTION OF MESH;  Surgeon: Claud Kelp, MD;  Location: [facility] OR;  Service: General;  Laterality: N/A;   LAPAROSCOPIC ABDOMINAL EXPLORATION  1997   LAPAROSCOPIC INCISIONAL / UMBILICAL / VENTRAL HERNIA REPAIR  12/26/2015   repair of incarcerated incisional hernia with mesh   LIVER CYST REMOVAL  2014   MELANOMA EXCISION  January 2013   removed from back   SHOULDER SURGERY Left 2010   Humeral Head Microfracture    TUBAL LIGATION  UPPER GASTROINTESTINAL ENDOSCOPY  November 2012   Social History   Tobacco Use   Smoking status: Former    Packs/day: 0.00    Years: 0.00    Additional pack years: 0.00    Total pack years: 0.00    Types: Cigarettes, E-cigarettes    Quit date: 06/12/2018    Years since quitting: 4.4   Smokeless tobacco: Never   Tobacco comments:    Smokes hemp every night to help with her pain  Vaping Use   Vaping Use: Every day   Start date: 04/18/2019   Substances: Nicotine-salt  Substance Use Topics   Alcohol use: No    Comment: 12/26/2015 'quit in ~ 1997"   Drug use: Yes    Types: Other-see comments, Marijuana    Comment: 12/26/2015 "quit in ~  2010".   smokes CBD with 0.3% THC nightly    Family History  Adopted: Yes  Problem Relation Age of Onset   Bipolar disorder Mother        never diagnosed but patient suspects mother had bipolar disorder.Marland KitchenMarland KitchenMarland KitchenMarland Kitchenpt was adopeted, only knew birth mother for a short period of time.   Anxiety disorder Mother    Cirrhosis Mother    Diabetes Mother    Turner syndrome Daughter    Cancer Daughter    Anxiety disorder Daughter    Bipolar disorder Daughter    Interstitial cystitis Daughter    ADD / ADHD Neg Hx    Alcohol abuse Neg Hx    Drug abuse Neg Hx    Dementia Neg Hx    Depression Neg Hx    OCD Neg Hx    Paranoid behavior Neg Hx    Schizophrenia Neg Hx    Seizures Neg Hx    Sexual abuse Neg Hx    Physical abuse Neg Hx    Colon cancer Neg Hx    Allergies  Allergen Reactions   Barium Sulfate Nausea And Vomiting    Stomach cramps, extreme diarrhea, and vomiting   Barium-Containing Compounds Other (See Comments)    Stomach cramps, extreme diarrhea, and vomiting   Bee Venom Anaphylaxis   Haemophilus Influenzae Swelling    Trouble swallowing   Influenza Virus Vaccine Swelling    Trouble swallowing Trouble swallowing    Seldane [Terfenadine] Nausea And Vomiting and Other (See Comments)    CAUSES TOP LAYER OF SKIN TO PEEL   Sulfa Antibiotics Anaphylaxis   Amitriptyline     Other reaction(s): Other (See Comments) Mood instability   Lisinopril Cough   Lithium Other (See Comments)    Agitation and hostility   Tetracyclines & Related Hives and Nausea And Vomiting   Celebrex [Celecoxib] Nausea And Vomiting   Dexamethasone     Elevated blood sugar,nausea,diarrhea, muscle weakness   Metformin And Related Nausea And Vomiting   Prednisone     Other reaction(s): Generalized Edema (intolerance)   Trazodone And Nefazodone     Sick   Zinc Nausea And Vomiting   Aspirin Rash and Other (See Comments)    Upset stomach   Ativan [Lorazepam] Other (See Comments)    Irritability   Erythromycin Nausea And Vomiting, Rash and Other (See  Comments)    Cramps   Lipitor [Atorvastatin] Nausea And Vomiting   Review of Systems  Skin:  Positive for itching.  All other systems reviewed and are negative.    Objective:     BP 128/74   Pulse 80   Ht 5\' 1"  (1.549 m)   Wt 180  lb 9.6 oz (81.9 kg)   SpO2 96%   BMI 34.12 kg/m  BP Readings from Last 3 Encounters:  12/02/22 128/74  11/17/22 (!) 184/110  11/04/22 (!) 142/70   Physical Exam Vitals reviewed.  Constitutional:      General: She is not in acute distress.    Appearance: Normal appearance. She is obese. She is not toxic-appearing.  HENT:     Head: Normocephalic and atraumatic.     Right Ear: External ear normal.     Left Ear: External ear normal.     Nose: Nose normal. No congestion or rhinorrhea.     Mouth/Throat:     Mouth: Mucous membranes are moist.     Pharynx: Oropharynx is clear. No oropharyngeal exudate or posterior oropharyngeal erythema.  Eyes:     General: No scleral icterus.    Extraocular Movements: Extraocular movements intact.     Conjunctiva/sclera: Conjunctivae normal.     Pupils: Pupils are equal, round, and reactive to light.  Cardiovascular:     Rate and Rhythm: Normal rate and regular rhythm.     Pulses: Normal pulses.     Heart sounds: Normal heart sounds. No murmur heard.    No friction rub. No gallop.  Pulmonary:     Effort: Pulmonary effort is normal.     Breath sounds: Normal breath sounds. No wheezing, rhonchi or rales.  Abdominal:     General: Abdomen is flat. Bowel sounds are normal. There is no distension.     Palpations: Abdomen is soft.     Tenderness: There is no abdominal tenderness.  Musculoskeletal:        General: No swelling. Normal range of motion.     Cervical back: Normal range of motion.     Right lower leg: No edema.     Left lower leg: No edema.  Lymphadenopathy:     Cervical: No cervical adenopathy.  Skin:    General: Skin is warm and dry.     Capillary Refill: Capillary refill takes less than 2  seconds.     Coloration: Skin is not jaundiced.  Neurological:     General: No focal deficit present.     Mental Status: She is alert and oriented to person, place, and time.  Psychiatric:        Mood and Affect: Mood normal.        Behavior: Behavior normal.   Last CBC Lab Results  Component Value Date   WBC 8.5 11/17/2022   HGB 14.3 11/17/2022   HCT 43.5 11/17/2022   MCV 86 11/17/2022   MCH 28.3 11/17/2022   RDW 13.1 11/17/2022   PLT 318 11/17/2022   Last metabolic panel Lab Results  Component Value Date   GLUCOSE 86 11/17/2022   NA 141 11/17/2022   K 3.8 11/17/2022   CL 101 11/17/2022   CO2 22 11/17/2022   BUN 12 11/17/2022   CREATININE 0.76 11/17/2022   EGFR 91 11/17/2022   CALCIUM 9.1 11/17/2022   PROT 7.0 11/17/2022   ALBUMIN 4.3 11/17/2022   LABGLOB 2.7 11/17/2022   AGRATIO 1.6 11/17/2022   BILITOT 0.7 11/17/2022   ALKPHOS 90 11/17/2022   AST 23 11/17/2022   ALT 26 11/17/2022   ANIONGAP 8 12/08/2017   Last lipids Lab Results  Component Value Date   CHOL 150 07/08/2022   HDL 42 07/08/2022   LDLCALC 87 07/08/2022   TRIG 115 07/08/2022   CHOLHDL 3.6 07/08/2022   Last hemoglobin A1c Lab Results  Component Value Date   HGBA1C 5.7 (H) 04/09/2022   Last thyroid functions Lab Results  Component Value Date   TSH 1.110 04/09/2022   T3TOTAL 138.6 02/08/2014   Last vitamin D Lab Results  Component Value Date   VD25OH 25.0 (L) 04/09/2022   Last vitamin B12 and Folate Lab Results  Component Value Date   VITAMINB12 419 04/09/2022   FOLATE 6.6 04/09/2022   The 10-year ASCVD risk score (Arnett DK, et al., 2019) is: 3.1%    Assessment & Plan:   Problem List Items Addressed This Visit       Essential hypertension    Presenting today for HTN follow-up.  Spironolactone was increased to 50 mg daily at her last appointment.  She is additionally prescribed losartan-HCTZ 100-25 mg daily, and amlodipine 10 mg daily.  Renal artery ultrasound was also  ordered at her last appointment to evaluate for secondary causes of hypertension.  This has not been completed to date.  CMP checked 4/12 and renal function/electrolytes were within normal limits.  BP today has improved, 128/74. -No additional medication changes today. -Renal artery ultrasound pending      Pruritus - Primary    Recently evaluated for pruritus and rash.  She has establish care with dermatology and was treated with a topical steroid cream, which has improved her rash.  She will return to care for follow-up with dermatology in 1 month.  She is interested in additional medication options for itch relief today. -Hydroxyzine 25 mg every 8 hours for itch relief prescribed today -Dermatology follow-up in 1 month       Return in about 3 months (around 03/04/2023).    Billie Lade, MD

## 2022-12-02 NOTE — Telephone Encounter (Signed)
Refills sent

## 2022-12-03 ENCOUNTER — Encounter: Payer: Self-pay | Admitting: Internal Medicine

## 2022-12-03 NOTE — Assessment & Plan Note (Addendum)
Presenting today for HTN follow-up.  Spironolactone was increased to 50 mg daily at her last appointment.  She is additionally prescribed losartan-HCTZ 100-25 mg daily, and amlodipine 10 mg daily.  Renal artery ultrasound was also ordered at her last appointment to evaluate for secondary causes of hypertension.  This has not been completed to date.  CMP checked 4/12 and renal function/electrolytes were within normal limits.  BP today has improved, 128/74. -No additional medication changes today. -Renal artery ultrasound pending

## 2022-12-03 NOTE — Assessment & Plan Note (Signed)
Recently evaluated for pruritus and rash.  She has establish care with dermatology and was treated with a topical steroid cream, which has improved her rash.  She will return to care for follow-up with dermatology in 1 month.  She is interested in additional medication options for itch relief today. -Hydroxyzine 25 mg every 8 hours for itch relief prescribed today -Dermatology follow-up in 1 month

## 2022-12-11 ENCOUNTER — Telehealth: Payer: Self-pay | Admitting: Internal Medicine

## 2022-12-17 ENCOUNTER — Ambulatory Visit (HOSPITAL_COMMUNITY)
Admission: RE | Admit: 2022-12-17 | Discharge: 2022-12-17 | Disposition: A | Discharge status: Home / Self Care | Payer: 59 | Source: Ambulatory Visit | Attending: Internal Medicine | Admitting: Internal Medicine

## 2022-12-17 DIAGNOSIS — I10 Essential (primary) hypertension: Secondary | ICD-10-CM | POA: Diagnosis present

## 2022-12-18 ENCOUNTER — Telehealth: Payer: Self-pay | Admitting: Internal Medicine

## 2022-12-18 NOTE — Telephone Encounter (Signed)
Pt return call for lab results  

## 2022-12-18 NOTE — Telephone Encounter (Signed)
Pt called in wants a cal back to discuss recent labs /scan

## 2023-01-02 ENCOUNTER — Telehealth: Payer: Self-pay | Admitting: *Deleted

## 2023-01-02 NOTE — Telephone Encounter (Signed)
Patient came by desk checking on status of home sleep study. Patient states she has not received a call. Order was placed march 2024.    Please call and advise. [phone]

## 2023-01-05 ENCOUNTER — Telehealth: Payer: Self-pay | Admitting: Internal Medicine

## 2023-01-05 DIAGNOSIS — G8929 Other chronic pain: Secondary | ICD-10-CM

## 2023-01-05 DIAGNOSIS — M5137 Other intervertebral disc degeneration, lumbosacral region: Secondary | ICD-10-CM

## 2023-01-05 MED ORDER — TIZANIDINE HCL 4 MG PO TABS
4.00 mg | ORAL_TABLET | Freq: Four times a day (QID) | ORAL | 0 refills | Status: DC | PRN
Start: 2023-01-05 — End: 2023-02-03

## 2023-01-05 NOTE — Telephone Encounter (Signed)
Prescription Request  01/05/2023  LOV: 12/02/2022  What is the name of the medication or equipment? tiZANidine (ZANAFLEX) 4 MG tablet [[id number]]    Have you contacted your pharmacy to request a refill? No   Which pharmacy would you like this sent to?  [id number] - [place], [place] - [address] AT [address] [place] [address] Phone: [phone] Fax: [phone]    Patient notified that their request is being sent to the clinical staff for review and that they should receive a response within 2 business days.   Please advise at [facility] [phone]

## 2023-01-08 NOTE — Telephone Encounter (Signed)
There is no documentation on this from [place].  I have placed an order in [facility] for this patient.  Pt can contact them 24-48 hours, [phone].

## 2023-01-09 NOTE — Telephone Encounter (Signed)
ED Follow Up Outreach  Initial Outreach Documentation   What specific health condition(s) do you have that needs to be monitored by a doctor?  Patient reports pain... pt. becam, kind of upset for my call... she has a doctor now, she hasn't gone to the ED for a long time... pt hang up  I was not able to do rest of the questioner.

## 2023-01-13 ENCOUNTER — Other Ambulatory Visit: Payer: Self-pay | Admitting: Internal Medicine

## 2023-01-13 DIAGNOSIS — I10 Essential (primary) hypertension: Secondary | ICD-10-CM

## 2023-01-16 NOTE — Telephone Encounter (Signed)
Patient calling back to check on status of whether or not insurance will cover the [facility].   Patient states she has called the [phone] to discuss but they were waiting on pending insurance.  Please call and advise patient. [phone] Routing to [facility] team

## 2023-01-19 ENCOUNTER — Other Ambulatory Visit: Payer: Self-pay | Admitting: *Deleted

## 2023-01-19 DIAGNOSIS — G4733 Obstructive sleep apnea (adult) (pediatric): Secondary | ICD-10-CM

## 2023-01-19 NOTE — Telephone Encounter (Signed)
Patient called back and stated that SNAP is not covered under her insurance and would like to see if [facility] team could schedule an "in house" sleep study at [facility]. Routing to [facility] team to please advise   [phone]

## 2023-01-19 NOTE — Telephone Encounter (Signed)
We would need an order for a inlab study placed

## 2023-01-19 NOTE — Telephone Encounter (Signed)
Dr. Vassie Loll, Patient's insurance will not cover HST through [facility].  Do you want to order an in lab slep study at [facility]?  Please advise.  Thank you.

## 2023-01-19 NOTE — Telephone Encounter (Signed)
ATC x1.  Left detailed message for patient letting her know that Split night has been ordered.  Advised to call with any questions.

## 2023-02-03 ENCOUNTER — Telehealth: Payer: Self-pay | Admitting: Internal Medicine

## 2023-02-03 ENCOUNTER — Other Ambulatory Visit: Payer: Self-pay | Admitting: Internal Medicine

## 2023-02-03 ENCOUNTER — Other Ambulatory Visit: Payer: Self-pay

## 2023-02-03 DIAGNOSIS — M5137 Other intervertebral disc degeneration, lumbosacral region: Secondary | ICD-10-CM

## 2023-02-03 DIAGNOSIS — G8929 Other chronic pain: Secondary | ICD-10-CM

## 2023-02-03 MED ORDER — TIZANIDINE HCL 4 MG PO TABS
4.0000 mg | ORAL_TABLET | Freq: Four times a day (QID) | ORAL | 0 refills | Status: DC | PRN
Start: 2023-02-03 — End: 2023-03-03

## 2023-02-03 NOTE — Telephone Encounter (Signed)
Prescription Request  02/03/2023  LOV: 12/02/2022  What is the name of the medication or equipment? tiZANidine (ZANAFLEX) 4 MG tablet [[id number]]   Have you contacted your pharmacy to request a refill? No   Which pharmacy would you like this sent to?  [id number] - [place],  - [address] AT [address] [place] [address] Phone: [phone] Fax: [phone]    Patient notified that their request is being sent to the clinical staff for review and that they should receive a response within 2 business days.   Please advise at Mobile [phone] (mobile)

## 2023-02-08 ENCOUNTER — Other Ambulatory Visit: Payer: Self-pay | Admitting: Internal Medicine

## 2023-02-08 DIAGNOSIS — I10 Essential (primary) hypertension: Secondary | ICD-10-CM

## 2023-02-11 ENCOUNTER — Ambulatory Visit (INDEPENDENT_AMBULATORY_CARE_PROVIDER_SITE_OTHER): Payer: 59 | Admitting: Internal Medicine

## 2023-02-11 ENCOUNTER — Encounter: Payer: Self-pay | Admitting: Internal Medicine

## 2023-02-11 VITALS — BP 186/98 | HR 80 | Ht 61.0 in | Wt 177.8 lb

## 2023-02-11 DIAGNOSIS — M545 Low back pain, unspecified: Secondary | ICD-10-CM | POA: Diagnosis not present

## 2023-02-11 DIAGNOSIS — M5030 Other cervical disc degeneration, unspecified cervical region: Secondary | ICD-10-CM | POA: Diagnosis not present

## 2023-02-11 DIAGNOSIS — M2550 Pain in unspecified joint: Secondary | ICD-10-CM

## 2023-02-11 MED ORDER — METHYLPREDNISOLONE 4 MG PO TBPK
ORAL_TABLET | ORAL | 0 refills | Status: DC
Start: 2023-02-11 — End: 2023-06-09

## 2023-02-11 NOTE — Patient Instructions (Signed)
It was a pleasure to see you today.  Thank you for giving [place] the opportunity to be involved in your care.  Below is a brief recap of your visit and next steps.  We will plan to see you again in August.  Summary Medrol dosepak prescribed for pain relief Continue Zanaflex I recommend taking tylenol every 4-6 hours for pain relief Complete stretching exercises as able Return for follow up as scheduled in early August

## 2023-02-11 NOTE — Assessment & Plan Note (Signed)
Presenting today for an acute visit in the setting of diffuse arthralgias.  This has been present x 2 weeks as described above.  There is no inciting event or trauma at the onset of symptoms.  There is tenderness palpation over the paraspinal muscles of the lumbar spine as well as the trapezius muscles bilaterally.  She relays that this has occurred previously, but not to this extent.  Pain is most severe in her lower back and bilateral hips.  No red flag symptoms are identified.  Of note, an autoimmune workup was completed last fall that was unremarkable. -Medrol Dosepak prescribed today for pain relief -I have recommended as needed use of Tylenol and Zanaflex as well -We additionally discussed home PT exercises that can aid with resolution of pain -She will return to care if symptoms worsen or fail to improve.  Routine follow-up is scheduled for next month.

## 2023-02-11 NOTE — Progress Notes (Signed)
Acute Office Visit  Subjective:     Patient ID: [name], female    DOB: 04-21-1966, 57 y.o.   MRN: [id number]  Chief Complaint  Patient presents with   Joint Pain    Patient states all her joints in her body are in pain    [name] presents today for an acute visit endorsing a 2-week history of diffuse joint pain.  There was no inciting event or trauma at the onset of pain.  Pain is worst in her lower back and hips.  She states that this has occurred previously, but not to this extent.  She is able to ambulate, albeit with difficulty, but states that she is mostly limited in performing daily activities because of pain.  She denies red flag symptoms including numbness/weakness in her lower extremities, saddle anesthesia, bowel/bladder incontinence, fever/chills, night sweats, and unintentional weight loss.  She has been using hemp and Zanaflex for pain relief.  Review of Systems  Musculoskeletal:  Positive for back pain and joint pain (Diffuse arthralgias).  All other systems reviewed and are negative.       Objective:    BP (!) 186/98   Pulse 80   Ht 5\' 1"  (1.549 m)   Wt 177 lb 12.8 oz (80.6 kg)   SpO2 95%   BMI 33.60 kg/m    Physical Exam Vitals reviewed.  Constitutional:      General: She is not in acute distress.    Appearance: Normal appearance. She is obese. She is not toxic-appearing.  HENT:     Head: Normocephalic and atraumatic.     Right Ear: External ear normal.     Left Ear: External ear normal.     Nose: Nose normal. No congestion or rhinorrhea.     Mouth/Throat:     Mouth: Mucous membranes are moist.     Pharynx: Oropharynx is clear. No oropharyngeal exudate or posterior oropharyngeal erythema.  Eyes:     General: No scleral icterus.    Extraocular Movements: Extraocular movements intact.     Conjunctiva/sclera: Conjunctivae normal.     Pupils: Pupils are equal, round, and reactive to light.  Cardiovascular:     Rate and Rhythm: Normal rate  and regular rhythm.     Pulses: Normal pulses.     Heart sounds: Normal heart sounds. No murmur heard.    No friction rub. No gallop.  Pulmonary:     Effort: Pulmonary effort is normal.     Breath sounds: Normal breath sounds. No wheezing, rhonchi or rales.  Abdominal:     General: Abdomen is flat. Bowel sounds are normal. There is no distension.     Palpations: Abdomen is soft.     Tenderness: There is no abdominal tenderness.  Musculoskeletal:        General: Tenderness (TTP over the paraspinal muscles of the lumbar spine and trapezius bilaterally) present. No swelling.     Cervical back: Normal range of motion.     Right lower leg: No edema.     Left lower leg: No edema.  Lymphadenopathy:     Cervical: No cervical adenopathy.  Skin:    General: Skin is warm and dry.     Capillary Refill: Capillary refill takes less than 2 seconds.     Coloration: Skin is not jaundiced.  Neurological:     General: No focal deficit present.     Mental Status: She is alert and oriented to person, place, and time.  Psychiatric:  Mood and Affect: Mood normal.        Behavior: Behavior normal.       Assessment & Plan:   Problem List Items Addressed This Visit       Arthralgia    Presenting today for an acute visit in the setting of diffuse arthralgias.  This has been present x 2 weeks as described above.  There is no inciting event or trauma at the onset of symptoms.  There is tenderness palpation over the paraspinal muscles of the lumbar spine as well as the trapezius muscles bilaterally.  She relays that this has occurred previously, but not to this extent.  Pain is most severe in her lower back and bilateral hips.  No red flag symptoms are identified.  Of note, an autoimmune workup was completed last fall that was unremarkable. -Medrol Dosepak prescribed today for pain relief -I have recommended as needed use of Tylenol and Zanaflex as well -We additionally discussed home PT exercises  that can aid with resolution of pain -She will return to care if symptoms worsen or fail to improve.  Routine follow-up is scheduled for next month.      Meds ordered this encounter  Medications   methylPREDNISolone (MEDROL DOSEPAK) 4 MG TBPK tablet    Sig: Use as directed.    Dispense:  21 each    Refill:  0    Return if symptoms worsen or fail to improve.  Billie Lade, MD

## 2023-02-13 ENCOUNTER — Encounter: Payer: Self-pay | Admitting: Internal Medicine

## 2023-02-23 ENCOUNTER — Emergency Department (HOSPITAL_COMMUNITY): Payer: 59

## 2023-02-23 ENCOUNTER — Emergency Department (HOSPITAL_COMMUNITY)
Admission: EM | Admit: 2023-02-23 | Discharge: 2023-02-23 | Disposition: A | Discharge status: Home / Self Care | Payer: 59 | Attending: Emergency Medicine | Admitting: Emergency Medicine

## 2023-02-23 ENCOUNTER — Other Ambulatory Visit: Payer: Self-pay

## 2023-02-23 ENCOUNTER — Encounter (HOSPITAL_COMMUNITY): Payer: Self-pay

## 2023-02-23 DIAGNOSIS — W208XXA Other cause of strike by thrown, projected or falling object, initial encounter: Secondary | ICD-10-CM | POA: Insufficient documentation

## 2023-02-23 DIAGNOSIS — S90122A Contusion of left lesser toe(s) without damage to nail, initial encounter: Secondary | ICD-10-CM | POA: Insufficient documentation

## 2023-02-23 DIAGNOSIS — S8012XA Contusion of left lower leg, initial encounter: Secondary | ICD-10-CM | POA: Insufficient documentation

## 2023-02-23 DIAGNOSIS — Y92009 Unspecified place in unspecified non-institutional (private) residence as the place of occurrence of the external cause: Secondary | ICD-10-CM | POA: Diagnosis not present

## 2023-02-23 DIAGNOSIS — I10 Essential (primary) hypertension: Secondary | ICD-10-CM | POA: Insufficient documentation

## 2023-02-23 DIAGNOSIS — Z79899 Other long term (current) drug therapy: Secondary | ICD-10-CM | POA: Insufficient documentation

## 2023-02-23 MED ORDER — HYDROCODONE-ACETAMINOPHEN 5-325 MG PO TABS: 1.0000 | ORAL_TABLET | Freq: Four times a day (QID) | ORAL | 0 refills | Status: DC | PRN

## 2023-02-23 MED ORDER — HYDROCODONE-ACETAMINOPHEN 5-325 MG PO TABS
1.0000 | ORAL_TABLET | Freq: Once | ORAL | Status: AC
  Administered 2023-02-23: 1 via ORAL
  Filled 2023-02-23: qty 1

## 2023-02-23 NOTE — ED Provider Notes (Signed)
[facility] AT [facility] Provider Note   CSN: [id number] Arrival date & time: 02/23/23  1222     History  Chief Complaint  Patient presents with   Toe Injury    [name] is a 57 y.o. female.  She has PMH of hypertension and arthritis.  She presents the ER today complaining of left great toe and left lower leg pain.  She states she sustained injury when she was putting together a new workbench at home and it dropped when she was moving it and hit the left lower shin and left great toe.  Initially able to bear some weight but now pain is too great.  She had some bleeding from underneath the toenail and wrapped it at home.  She states she can feel her heartbeat in her toes when he has severe pain.  Denies any other complaints.  HPI     Home Medications Prior to Admission medications   Medication Sig Start Date End Date Taking? Authorizing Provider  albuterol (VENTOLIN HFA) 108 (90 Base) MCG/ACT inhaler Inhale 2 puffs into the lungs every 6 (six) hours as needed for wheezing or shortness of breath. 11/04/22   Billie Lade, MD  amLODipine (NORVASC) 10 MG tablet Take 1 tablet (10 mg total) by mouth daily. 09/17/21   Particia Nearing, PA-C  ARIPiprazole (ABILIFY) 5 MG tablet Take 1 tablet (5 mg total) by mouth at bedtime. 09/30/22 12/02/22  Elsie Lincoln, MD  cetirizine (ZYRTEC ALLERGY) 10 MG tablet Take 1 tablet (10 mg total) by mouth daily. 11/17/22   Gardenia Phlegm, MD  clobetasol ointment (TEMOVATE) 0.05 % Apply to affected area every night for 4 weeks, then every other day for 4 weeks and then twice a week for 4 weeks or until resolution. 06/23/22   Constant, Peggy, MD  EPINEPHrine 0.3 mg/0.3 mL IJ SOAJ injection Inject 0.3 mg into the muscle as needed for anaphylaxis. Inject into the muscle. 04/09/22   Billie Lade, MD  losartan-hydrochlorothiazide (HYZAAR) 100-25 MG tablet TAKE 1 TABLET BY MOUTH DAILY 01/13/23   Billie Lade, MD   methylPREDNISolone (MEDROL DOSEPAK) 4 MG TBPK tablet Use as directed. 02/11/23   Billie Lade, MD  mirabegron ER (MYRBETRIQ) 25 MG TB24 tablet Take 1 tablet (25 mg total) by mouth daily. 10/07/22 01/05/23  Billie Lade, MD  rosuvastatin (CRESTOR) 10 MG tablet Take 1 tablet (10 mg total) by mouth daily. 04/11/22   Billie Lade, MD  spironolactone (ALDACTONE) 50 MG tablet TAKE 1 TABLET(50 MG) BY MOUTH DAILY 02/09/23   Billie Lade, MD  tiZANidine (ZANAFLEX) 4 MG tablet Take 1 tablet (4 mg total) by mouth every 6 (six) hours as needed for up to 60 doses for muscle spasms. 02/03/23   Anabel Halon, MD      Allergies    Barium sulfate, Barium-containing compounds, Bee venom, Haemophilus influenzae, Influenza virus vaccine, Seldane [terfenadine], Sulfa antibiotics, Amitriptyline, Lisinopril, Lithium, Tetracyclines & related, Celebrex [celecoxib], Dexamethasone, Metformin and related, Prednisone, Trazodone and nefazodone, Zinc, Aspirin, Ativan [lorazepam], Erythromycin, and Lipitor [atorvastatin]    Review of Systems   Review of Systems  Physical Exam Updated Vital Signs BP (!) 210/106   Pulse 71   Temp 98.6 F (37 C) (Oral)   Resp (!) 24   Ht 5\' 1"  (1.549 m)   Wt 81.6 kg   SpO2 100%   BMI 34.01 kg/m  Physical Exam Vitals and nursing note reviewed.  Constitutional:      General: She is not in acute distress.    Appearance: She is well-developed.  HENT:     Head: Normocephalic and atraumatic.     Mouth/Throat:     Mouth: Mucous membranes are moist.  Eyes:     Conjunctiva/sclera: Conjunctivae normal.  Cardiovascular:     Rate and Rhythm: Normal rate and regular rhythm.     Heart sounds: No murmur heard. Pulmonary:     Effort: Pulmonary effort is normal. No respiratory distress.     Breath sounds: Normal breath sounds.  Abdominal:     Palpations: Abdomen is soft.     Tenderness: There is no abdominal tenderness.  Musculoskeletal:        General: Swelling present. No  deformity.     Cervical back: Neck supple.     Comments: Swelling to left great toe.  Nail is intact with small amount of blood oozing from under the nail.  No visible lacerations, nail itself is thickened and discolored similar to all her other toenails on the foot.  Capillary refill is brisk. Bruising mild swelling to left distal anterior lower leg.  No crepitus.  No tenderness to the ankle joint, DP and PT pulses are intact.  Skin:    General: Skin is warm and dry.     Capillary Refill: Capillary refill takes less than 2 seconds.  Neurological:     General: No focal deficit present.     Mental Status: She is alert and oriented to person, place, and time.  Psychiatric:        Mood and Affect: Mood normal.     ED Results / Procedures / Treatments   Labs (all labs ordered are listed, but only abnormal results are displayed) Labs Reviewed - No data to display  EKG None  Radiology DG Toe Great Left  Result Date: 02/23/2023 CLINICAL DATA:  Work bench fell on to great toe. EXAM: LEFT GREAT TOE COMPARISON:  None Available. FINDINGS: There is soft tissue swelling about the distal phalanx of the great toe without associated fracture, subcutaneous emphysema or radiopaque foreign body. No significant hallux valgus deformity. Joint spaces are preserved. No erosions. IMPRESSION: Soft tissue swelling about the distal phalanx of the great toe without associated fracture or radiopaque foreign body. Electronically Signed   By: Simonne Come M.D.   On: 02/23/2023 13:42   DG Tibia/Fibula Left  Result Date: 02/23/2023 CLINICAL DATA:  Large work bench fell onto left foot and leg. EXAM: LEFT TIBIA AND FIBULA - 2 VIEW COMPARISON:  None Available. FINDINGS: No fracture or dislocation. Limited visualization of the adjacent knee and ankle is normal given obliquity and large field of view. Small plantar calcaneal spur. Enthesopathic change involving the Achilles tendon insertion site. Regional soft tissues appear  normal. No radiopaque foreign body. IMPRESSION: 1. No fracture or dislocation. 2. Small plantar calcaneal spur. Electronically Signed   By: Simonne Come M.D.   On: 02/23/2023 13:41    Procedures Procedures    Medications Ordered in ED Medications  HYDROcodone-acetaminophen (NORCO/VICODIN) 5-325 MG per tablet 1 tablet (has no administration in time range)    ED Course/ Medical Decision Making/ A&P                             Medical Decision Making Ddx: fracture, sprain, strain, contusion, dislocation, other  ED course: Presenting with left great toe pain and left lower leg pain after  dropping a workbench that she was assembling on her foot.  She has swelling to the great toe with scant amount of bloody drainage from under the toenail, the toenail is not loose, there is no crack in the toenail.  There are no open wounds on exam.  Tetanus shot is up-to-date as of last year.  I discussed with patient do not feel to be benefit to removing toenail at this time.  The toenail is not loose, there is some bloody drainage so no need for trephination.  Advised on follow-up and return precautions.  Pain is much improved in the ED after 1 dose of hydrocodone.  X-rays were ordered.  I viewed and interpreted these independently.  X-ray of the left toe shows no fracture or dislocation.  Soft tissue swelling is noted.  I agree with radiology interpretation X-ray of the left tibia and fibula shows no fracture or dislocation, I agree with radiology interpretation.  Amount and/or Complexity of Data Reviewed Radiology: ordered.  Risk Prescription drug management.           Final Clinical Impression(s) / ED Diagnoses Final diagnoses:  None    Rx / DC Orders ED Discharge Orders     None         Josem Kaufmann 02/23/23 1515    Terrilee Files, MD 02/23/23 [phone]

## 2023-02-23 NOTE — ED Triage Notes (Signed)
Pt flipped a very large work bench onto her left great toe just PTA.

## 2023-02-23 NOTE — Discharge Instructions (Signed)
You have a contusion of your toe.  Your x-ray did not show any fractures.  You do have some bleeding from under the toenail.  As discussed you may have your toenail fall off eventually.  Keep your toe clean and dry.  Use the postop shoe and crutches as needed to help with pain.  You are given a small amount of pain medicine.  Take this when pain is more severe, with less severe need to go the counter medication as directed on packaging.  Follow-up with your primary care doctor.  If you are not getting better as expected you should follow-up with orthopedics.  Come back to the ER if you have new or worsening symptoms.

## 2023-02-24 ENCOUNTER — Telehealth: Payer: Self-pay

## 2023-02-24 NOTE — Transitions of Care (Post Inpatient/ED Visit) (Signed)
02/24/2023  Name: [name] MRN: [id number] DOB: 09-Sep-1965  Today's TOC FU Call Status: Today's TOC FU Call Status:: Successful TOC FU Call Competed TOC FU Call Complete Date: 02/24/23  Transition Care Management Follow-up Telephone Call Date of Discharge: 02/23/23 [facility] Type of Discharge: Emergency Department Reason for ED Visit: Other: (Contusion of toe of left foot) How have you been since you were released from the hospital?: Better Any questions or concerns?: No  Items Reviewed: Did you receive and understand the discharge instructions provided?: Yes Medications obtained,verified, and reconciled?: Yes (Medications Reviewed) Any new allergies since your discharge?: No Dietary orders reviewed?: Yes Do you have support at home?: Yes  Medications Reviewed Today: Medications Reviewed Today     Reviewed by Merleen Nicely, LPN (Licensed Practical Nurse) on 02/24/23 at 1015  Med List Status: <None>   Medication Order Taking? Sig Documenting Provider Last Dose Status Informant  albuterol (VENTOLIN HFA) 108 (90 Base) MCG/ACT inhaler [id number] Yes Inhale 2 puffs into the lungs every 6 (six) hours as needed for wheezing or shortness of breath. Billie Lade, MD Taking Active   amLODipine (NORVASC) 10 MG tablet [id number] Yes Take 1 tablet (10 mg total) by mouth daily. Particia Nearing, [place] Taking Active Self  ARIPiprazole (ABILIFY) 5 MG tablet [id number] Yes Take 1 tablet (5 mg total) by mouth at bedtime. Elsie Lincoln, MD Taking Active   cetirizine (ZYRTEC ALLERGY) 10 MG tablet [id number] Yes Take 1 tablet (10 mg total) by mouth daily. Gardenia Phlegm, MD Taking Active   clobetasol ointment (TEMOVATE) 0.05 % [id number] Yes Apply to affected area every night for 4 weeks, then every other day for 4 weeks and then twice a week for 4 weeks or until resolution. Constant, Peggy, MD Taking Active Self  EPINEPHrine 0.3 mg/0.3 mL IJ SOAJ injection  [id number] Yes Inject 0.3 mg into the muscle as needed for anaphylaxis. Inject into the muscle. Billie Lade, MD Taking Active Self  HYDROcodone-acetaminophen [facility]) 5-325 MG tablet [id number] Yes Take 1 tablet by mouth every 6 (six) hours as needed. Carmel [place] A, PA-C Taking Active   losartan-hydrochlorothiazide (HYZAAR) 100-25 MG tablet [id number] Yes TAKE 1 TABLET BY MOUTH DAILY Billie Lade, MD Taking Active   methylPREDNISolone (MEDROL DOSEPAK) 4 MG TBPK tablet [id number] Yes Use as directed. Billie Lade, MD Taking Active   mirabegron ER [facility]) 25 MG TB24 tablet [id number] Yes Take 1 tablet (25 mg total) by mouth daily. Billie Lade, MD Taking Active   rosuvastatin (CRESTOR) 10 MG tablet [id number] Yes Take 1 tablet (10 mg total) by mouth daily. Billie Lade, MD Taking Active Self  spironolactone (ALDACTONE) 50 MG tablet [id number] Yes TAKE 1 TABLET(50 MG) BY MOUTH DAILY Billie Lade, MD Taking Active   tiZANidine (ZANAFLEX) 4 MG tablet [id number] Yes Take 1 tablet (4 mg total) by mouth every 6 (six) hours as needed for up to 60 doses for muscle spasms. Anabel Halon, MD Taking Active   Med List Note Concepcion Elk, RN 10/27/19 1422): 10-27-19 PA request for Methocarbamol submitted per [facility]  Key [id number]  dw            Home Care and Equipment/Supplies: Were Home Health Services Ordered?: NA Any new equipment or medical supplies ordered?: NA  Functional Questionnaire: Do you need assistance with bathing/showering or dressing?: No Do you need assistance with meal preparation?: No Do you need assistance with eating?: No Do  you have difficulty maintaining continence: No Do you need assistance with getting out of bed/getting out of a chair/moving?: No Do you have difficulty managing or taking your medications?: No  Follow up appointments reviewed: PCP Follow-up appointment confirmed?: Yes Date of PCP follow-up appointment?: 03/06/23 Follow-up Provider: [facility] Follow-up appointment confirmed?: No Do you need transportation to your follow-up appointment?: No Do you understand care options if your condition(s) worsen?: Yes-patient verbalized understanding    SIGNATURE  Woodfin Ganja LPN [facility] Nurse Health Advisor Direct Dial [phone]

## 2023-02-25 ENCOUNTER — Encounter: Payer: Self-pay | Admitting: Internal Medicine

## 2023-02-25 MED ORDER — HYDROCODONE-ACETAMINOPHEN 5-325 MG PO TABS: 1.0000 | ORAL_TABLET | Freq: Four times a day (QID) | ORAL | 0 refills | Status: DC | PRN

## 2023-03-03 ENCOUNTER — Other Ambulatory Visit: Payer: Self-pay

## 2023-03-03 ENCOUNTER — Encounter: Payer: Self-pay | Admitting: Internal Medicine

## 2023-03-03 DIAGNOSIS — G8929 Other chronic pain: Secondary | ICD-10-CM

## 2023-03-03 DIAGNOSIS — M5137 Other intervertebral disc degeneration, lumbosacral region: Secondary | ICD-10-CM

## 2023-03-03 MED ORDER — TIZANIDINE HCL 4 MG PO TABS
4.0000 mg | ORAL_TABLET | Freq: Four times a day (QID) | ORAL | 0 refills | Status: DC | PRN
Start: 2023-03-03 — End: 2023-03-31

## 2023-03-06 ENCOUNTER — Encounter: Payer: Self-pay | Admitting: Internal Medicine

## 2023-03-06 ENCOUNTER — Ambulatory Visit (INDEPENDENT_AMBULATORY_CARE_PROVIDER_SITE_OTHER): Payer: 59 | Admitting: Internal Medicine

## 2023-03-06 VITALS — BP 140/80 | HR 82 | Ht 61.0 in | Wt 172.8 lb

## 2023-03-06 DIAGNOSIS — S99922D Unspecified injury of left foot, subsequent encounter: Secondary | ICD-10-CM | POA: Diagnosis not present

## 2023-03-06 DIAGNOSIS — I10 Essential (primary) hypertension: Secondary | ICD-10-CM | POA: Diagnosis not present

## 2023-03-06 DIAGNOSIS — S99922A Unspecified injury of left foot, initial encounter: Secondary | ICD-10-CM

## 2023-03-06 MED ORDER — LOSARTAN POTASSIUM-HCTZ 100-25 MG PO TABS
1.0000 | ORAL_TABLET | Freq: Every day | ORAL | 1 refills | Status: DC
Start: 2023-03-06 — End: 2023-06-09

## 2023-03-06 MED ORDER — SPIRONOLACTONE 50 MG PO TABS
50.0000 mg | ORAL_TABLET | Freq: Every day | ORAL | 1 refills | Status: DC
Start: 2023-03-06 — End: 2023-06-09

## 2023-03-06 MED ORDER — AMLODIPINE BESYLATE 10 MG PO TABS
10.0000 mg | ORAL_TABLET | Freq: Every day | ORAL | 1 refills | Status: DC
Start: 2023-03-06 — End: 2023-06-09

## 2023-03-06 NOTE — Assessment & Plan Note (Signed)
Presenting today for ER follow-up in the setting of recent presentation on 7/29 in the setting of toe injury.  She dropped a workbench on the big toe of the left foot.  There is dark discoloration and range of motion is limited secondary to pain.  X-rays were negative for fracture.  She also has bruising on the left shin.  Her toenail is causing great concern today as she describes a burning sensation and there is dried blood appreciated around the nailbed.  She feels as though the nail is loose and would like to have it removed. -Podiatry referral placed today -Continue as needed use of hydrocodone-acetaminophen for pain relief

## 2023-03-06 NOTE — Progress Notes (Signed)
Established Patient Office Visit  Subjective   Patient ID: [name], female    DOB: Nov 04, 1965  Age: 57 y.o. MRN: [id number]  Chief Complaint  Patient presents with   Hypertension    Follow up   er follow up    02/23/2023 for left big toe.   Ms. Shoulders returns to care today for ER follow-up after ED presentation on 7/29 for a toe injury.  She dropped a Theme park manager approximately 175 pounds on her left big toe and shin.  X-rays obtained in the emergency department were negative for fracture or acute findings, but did demonstrate soft tissue swelling.  Pain control and additional supportive care measures were recommended.  Today she states that her pain has not significantly improved.  There is dark, discolored blood around the nailbed on the first digit of the left foot and she states that the nail feels loose.  She would like to see a podiatrist to have the toenail removed.  She is using hydrocodone-acetaminophen 5-325 mg as needed for pain relief and states that she has only required 1 tablet over the last week.  She does not have additional concerns to discuss today.  Past Medical History:  Diagnosis Date   Acute pansinusitis 05/03/2019   Anxiety    Arthritis    "pretty much all over; mainly neck and back" (12/26/2015)   Asthma    Atypical squamous cell changes of undetermined significance (ASCUS) on vaginal cytology 04/11/2022   Bipolar 1 disorder (HCC)    Bipolar disorder (HCC)    Bipolar I disorder, most recent episode depressed (HCC) 07/04/2014   Cervical cancer (HCC)    "stage I"   Chronic back pain    Chronic bronchitis (HCC)    Colon cancer screening 02/15/2018   COPD (chronic obstructive pulmonary disease) (HCC)    Degenerative disc disease    Depression    Dysrhythmia    Fibromyalgia    GERD (gastroesophageal reflux disease)    Hepatic cyst    High cholesterol    History of hiatal hernia    Hypertension 08/04/2012   IBS (irritable bowel syndrome)  Diagnosed November 2012   Incisional hernia with gangrene and obstruction 12/26/2015   Melanoma of back (HCC)    Ovarian cancer (HCC)    "stage II"   Plantar fasciitis, bilateral    Preventative health care 04/11/2022   Sleep apnea    cpap coming  "soon" (12/26/2015)   Past Surgical History:  Procedure Laterality Date   ABDOMINAL HYSTERECTOMY  1997   APPENDECTOMY     CARDIAC CATHETERIZATION  2015   CARPAL TUNNEL RELEASE Right    CESAREAN SECTION  1987   CHOLECYSTECTOMY OPEN  1998   COLONOSCOPY  November 2012   EYE SURGERY     CATARACTS REMOVED 09/09/17 and 09/16/17   HERNIA REPAIR     INCISIONAL HERNIA REPAIR N/A 12/26/2015   Procedure: LAPAROSCOPIC REPAIR INCISIONAL HERNIA WITH MESH;  Surgeon: Claud Kelp, MD;  Location: [facility] OR;  Service: General;  Laterality: N/A;   INSERTION OF MESH N/A 12/26/2015   Procedure: INSERTION OF MESH;  Surgeon: Claud Kelp, MD;  Location: [facility] OR;  Service: General;  Laterality: N/A;   LAPAROSCOPIC ABDOMINAL EXPLORATION  1997   LAPAROSCOPIC INCISIONAL / UMBILICAL / VENTRAL HERNIA REPAIR  12/26/2015   repair of incarcerated incisional hernia with mesh   LIVER CYST REMOVAL  2014   MELANOMA EXCISION  January 2013   removed from back   SHOULDER SURGERY  Left 2010   Humeral Head Microfracture    TUBAL LIGATION     UPPER GASTROINTESTINAL ENDOSCOPY  November 2012   Social History   Tobacco Use   Smoking status: Former    Current packs/day: 0.00    Types: Cigarettes, E-cigarettes    Quit date: 06/12/2018    Years since quitting: 4.7   Smokeless tobacco: Never   Tobacco comments:    Smokes hemp every night to help with her pain  Vaping Use   Vaping status: Every Day   Start date: 04/18/2019   Substances: Nicotine-salt  Substance Use Topics   Alcohol use: No    Comment: 12/26/2015 'quit in ~ 1997"   Drug use: Yes    Types: Other-see comments, Marijuana    Comment: 12/26/2015 "quit in ~  2010".   smokes CBD with 0.3% THC nightly   Family History   Adopted: Yes  Problem Relation Age of Onset   Bipolar disorder Mother        never diagnosed but patient suspects mother had bipolar disorder.Marland KitchenMarland KitchenMarland KitchenMarland Kitchenpt was adopeted, only knew birth mother for a short period of time.   Anxiety disorder Mother    Cirrhosis Mother    Diabetes Mother    Turner syndrome Daughter    Cancer Daughter    Anxiety disorder Daughter    Bipolar disorder Daughter    Interstitial cystitis Daughter    ADD / ADHD Neg Hx    Alcohol abuse Neg Hx    Drug abuse Neg Hx    Dementia Neg Hx    Depression Neg Hx    OCD Neg Hx    Paranoid behavior Neg Hx    Schizophrenia Neg Hx    Seizures Neg Hx    Sexual abuse Neg Hx    Physical abuse Neg Hx    Colon cancer Neg Hx    Allergies  Allergen Reactions   Barium Sulfate Nausea And Vomiting    Stomach cramps, extreme diarrhea, and vomiting   Barium-Containing Compounds Other (See Comments)    Stomach cramps, extreme diarrhea, and vomiting   Bee Venom Anaphylaxis   Haemophilus Influenzae Swelling    Trouble swallowing   Influenza Virus Vaccine Swelling    Trouble swallowing Trouble swallowing    Seldane [Terfenadine] Nausea And Vomiting and Other (See Comments)    CAUSES TOP LAYER OF SKIN TO PEEL   Sulfa Antibiotics Anaphylaxis   Amitriptyline     Other reaction(s): Other (See Comments) Mood instability   Lisinopril Cough   Lithium Other (See Comments)    Agitation and hostility   Tetracyclines & Related Hives and Nausea And Vomiting   Celebrex [Celecoxib] Nausea And Vomiting   Dexamethasone     Elevated blood sugar,nausea,diarrhea, muscle weakness   Metformin And Related Nausea And Vomiting   Prednisone     Other reaction(s): Generalized Edema (intolerance)   Trazodone And Nefazodone     Sick   Zinc Nausea And Vomiting   Aspirin Rash and Other (See Comments)    Upset stomach   Ativan [Lorazepam] Other (See Comments)    Irritability   Erythromycin Nausea And Vomiting, Rash and Other (See Comments)     Cramps   Lipitor [Atorvastatin] Nausea And Vomiting   Review of Systems  Musculoskeletal:        Pain in the big toe of the left foot and left shin  All other systems reviewed and are negative.     Objective:     BP (!) 140/80  Pulse 82   Ht 5\' 1"  (1.549 m)   Wt 172 lb 12.8 oz (78.4 kg)   SpO2 96%   BMI 32.65 kg/m  BP Readings from Last 3 Encounters:  03/06/23 (!) 140/80  02/23/23 (!) 143/72  02/11/23 (!) 186/98   Physical Exam Musculoskeletal:        General: Signs of injury (TTP of the first digit of the left foot with dark discoloration.  ROM is limited secondary to pain.) present.     Comments: There is dark discoloration of the nailbed on the left foot with dried blood and a thickened toenail.    Last CBC Lab Results  Component Value Date   WBC 8.5 11/17/2022   HGB 14.3 11/17/2022   HCT 43.5 11/17/2022   MCV 86 11/17/2022   MCH 28.3 11/17/2022   RDW 13.1 11/17/2022   PLT 318 11/17/2022   Last metabolic panel Lab Results  Component Value Date   GLUCOSE 86 11/17/2022   NA 141 11/17/2022   K 3.8 11/17/2022   CL 101 11/17/2022   CO2 22 11/17/2022   BUN 12 11/17/2022   CREATININE 0.76 11/17/2022   EGFR 91 11/17/2022   CALCIUM 9.1 11/17/2022   PROT 7.0 11/17/2022   ALBUMIN 4.3 11/17/2022   LABGLOB 2.7 11/17/2022   AGRATIO 1.6 11/17/2022   BILITOT 0.7 11/17/2022   ALKPHOS 90 11/17/2022   AST 23 11/17/2022   ALT 26 11/17/2022   ANIONGAP 8 12/08/2017   Last lipids Lab Results  Component Value Date   CHOL 150 07/08/2022   HDL 42 07/08/2022   LDLCALC 87 07/08/2022   TRIG 115 07/08/2022   CHOLHDL 3.6 07/08/2022   Last hemoglobin A1c Lab Results  Component Value Date   HGBA1C 5.7 (H) 04/09/2022   Last thyroid functions Lab Results  Component Value Date   TSH 1.110 04/09/2022   T3TOTAL 138.6 02/08/2014   Last vitamin D Lab Results  Component Value Date   VD25OH 25.0 (L) 04/09/2022   Last vitamin B12 and Folate Lab Results  Component  Value Date   VITAMINB12 419 04/09/2022   FOLATE 6.6 04/09/2022   The 10-year ASCVD risk score (Arnett DK, et al., 2019) is: 3.7%    Assessment & Plan:   Problem List Items Addressed This Visit       Essential hypertension    Adequately controlled on current antihypertensive regimen.  Medications have been refilled today.  BP today is 140/80, which is partially related to pain in the setting of toe injury.      Injury of toenail of left foot - Primary    Presenting today for ER follow-up in the setting of recent presentation on 7/29 in the setting of toe injury.  She dropped a workbench on the big toe of the left foot.  There is dark discoloration and range of motion is limited secondary to pain.  X-rays were negative for fracture.  She also has bruising on the left shin.  Her toenail is causing great concern today as she describes a burning sensation and there is dried blood appreciated around the nailbed.  She feels as though the nail is loose and would like to have it removed. -Podiatry referral placed today -Continue as needed use of hydrocodone-acetaminophen for pain relief       Return in about 3 months (around 06/06/2023).    Billie Lade, MD

## 2023-03-06 NOTE — Patient Instructions (Signed)
It was a pleasure to see you today.  Thank you for giving [place] the opportunity to be involved in your care.  Below is a brief recap of your visit and next steps.  We will plan to see you again in 3 months.  Summary BP medications refilled Podiatry referral placed Follow up in 3 months

## 2023-03-06 NOTE — Assessment & Plan Note (Signed)
Adequately controlled on current antihypertensive regimen.  Medications have been refilled today.  BP today is 140/80, which is partially related to pain in the setting of toe injury.

## 2023-03-10 ENCOUNTER — Encounter: Payer: 59 | Admitting: Pulmonary Disease

## 2023-03-17 ENCOUNTER — Ambulatory Visit (INDEPENDENT_AMBULATORY_CARE_PROVIDER_SITE_OTHER): Payer: 59 | Admitting: Podiatry

## 2023-03-17 ENCOUNTER — Encounter: Payer: Self-pay | Admitting: Podiatry

## 2023-03-17 DIAGNOSIS — S90212A Contusion of left great toe with damage to nail, initial encounter: Secondary | ICD-10-CM | POA: Diagnosis not present

## 2023-03-17 DIAGNOSIS — L600 Ingrowing nail: Secondary | ICD-10-CM | POA: Diagnosis not present

## 2023-03-17 NOTE — Progress Notes (Signed)
Chief Complaint  Patient presents with   Nail Problem    "I dropped a wood bench on my toe about two weeks ago.  My nail has started to die off." N - toenail pain L - hallux left D - 2 weeks ago O - suddenly, a little better C - tender, loose on the bottom A - shoes, pressure, walking, and standing T - none    HPI: 57 y.o. female presenting today for evaluation of an injury that was sustained to the toenail of the left hallux about 2 weeks ago.  Patient states that she dropped a wooden bench on her toe about 2 weeks ago.  She has a history of injury to the left hallux nail plate and states that she has actually had to have it removed multiple times in the past.  It continues to grow back disfigured and thickened dystrophic.  She presents today for further treatment and evaluation  Past Medical History:  Diagnosis Date   Acute pansinusitis 05/03/2019   Anxiety    Arthritis    "pretty much all over; mainly neck and back" (12/26/2015)   Asthma    Atypical squamous cell changes of undetermined significance (ASCUS) on vaginal cytology 04/11/2022   Bipolar 1 disorder (HCC)    Bipolar disorder (HCC)    Bipolar I disorder, most recent episode depressed (HCC) 07/04/2014   Cervical cancer (HCC)    "stage I"   Chronic back pain    Chronic bronchitis (HCC)    Colon cancer screening 02/15/2018   COPD (chronic obstructive pulmonary disease) (HCC)    Degenerative disc disease    Depression    Dysrhythmia    Fibromyalgia    GERD (gastroesophageal reflux disease)    Hepatic cyst    High cholesterol    History of hiatal hernia    Hypertension 08/04/2012   IBS (irritable bowel syndrome) Diagnosed November 2012   Incisional hernia with gangrene and obstruction 12/26/2015   Melanoma of back (HCC)    Ovarian cancer (HCC)    "stage II"   Plantar fasciitis, bilateral    Preventative health care 04/11/2022   Sleep apnea    cpap coming  "soon" (12/26/2015)    Past Surgical History:   Procedure Laterality Date   ABDOMINAL HYSTERECTOMY  1997   APPENDECTOMY     CARDIAC CATHETERIZATION  2015   CARPAL TUNNEL RELEASE Right    CESAREAN SECTION  1987   CHOLECYSTECTOMY OPEN  1998   COLONOSCOPY  November 2012   EYE SURGERY     CATARACTS REMOVED 09/09/17 and 09/16/17   HERNIA REPAIR     INCISIONAL HERNIA REPAIR N/A 12/26/2015   Procedure: LAPAROSCOPIC REPAIR INCISIONAL HERNIA WITH MESH;  Surgeon: Claud Kelp, MD;  Location: [facility] OR;  Service: General;  Laterality: N/A;   INSERTION OF MESH N/A 12/26/2015   Procedure: INSERTION OF MESH;  Surgeon: Claud Kelp, MD;  Location: [facility];  Service: General;  Laterality: N/A;   LAPAROSCOPIC ABDOMINAL EXPLORATION  1997   LAPAROSCOPIC INCISIONAL / UMBILICAL / VENTRAL HERNIA REPAIR  12/26/2015   repair of incarcerated incisional hernia with mesh   LIVER CYST REMOVAL  2014   MELANOMA EXCISION  January 2013   removed from back   SHOULDER SURGERY Left 2010   Humeral Head Microfracture    TUBAL LIGATION     UPPER GASTROINTESTINAL ENDOSCOPY  November 2012    Allergies  Allergen Reactions   Barium Sulfate Nausea And Vomiting    Stomach cramps,  extreme diarrhea, and vomiting   Barium-Containing Compounds Other (See Comments)    Stomach cramps, extreme diarrhea, and vomiting   Bee Venom Anaphylaxis   Haemophilus Influenzae Swelling    Trouble swallowing   Influenza Virus Vaccine Swelling    Trouble swallowing Trouble swallowing    Seldane [Terfenadine] Nausea And Vomiting and Other (See Comments)    CAUSES TOP LAYER OF SKIN TO PEEL   Sulfa Antibiotics Anaphylaxis   Amitriptyline     Other reaction(s): Other (See Comments) Mood instability   Lisinopril Cough   Lithium Other (See Comments)    Agitation and hostility   Tetracyclines & Related Hives and Nausea And Vomiting   Celebrex [Celecoxib] Nausea And Vomiting   Dexamethasone     Elevated blood sugar,nausea,diarrhea, muscle weakness   Metformin And Related Nausea And  Vomiting   Prednisone     Other reaction(s): Generalized Edema (intolerance)   Trazodone And Nefazodone     Sick   Zinc Nausea And Vomiting   Aspirin Rash and Other (See Comments)    Upset stomach   Ativan [Lorazepam] Other (See Comments)    Irritability   Erythromycin Nausea And Vomiting, Rash and Other (See Comments)    Cramps   Lipitor [Atorvastatin] Nausea And Vomiting     Physical Exam: General: The patient is alert and oriented x3 in no acute distress.  Dermatology: Hyperkeratotic dystrophic toenail noted to the left hallux nail plate.  It is somewhat loosely adhered and very sensitive to touch.  No drainage.  No active bleeding.  Clinically no indication of infection  Vascular: Palpable pedal pulses bilaterally. Capillary refill within normal limits.  No appreciable edema.  No erythema.  Neurological: Grossly intact via light touch  Musculoskeletal Exam: No pedal deformities noted.  Gross alignment of the toes.  There continues to be soft tissue tenderness around the distal tuft of the great toe.  Radiographic Exam LT great toe 02/23/2023: IMPRESSION: Soft tissue swelling about the distal phalanx of the great toe without associated fracture or radiopaque foreign body.  Assessment/Plan of Care: 1.  Symptomatic dystrophic toenail left hallux nail plate 2.  Contusion of left great toe with damage to the nail plate  -Patient was evaluated. -Today we discussed addressing the toenail.  I did offer total temporary nail avulsion versus permanent total nail avulsion.  The patient has a history of removing the toenails in the past.  She says that the left hallux nail plate has actually been removed 3 times in the past.  After discussing total permanent nail avulsion she would like to pursue this option.  She is very frustrated with the toenail in general. -The toe was prepped in aseptic manner and digital block performed using 3 mL of 2% lidocaine plain -The nail was avulsed in its  entirety followed by 3 x 30-second application of phenol and alcohol flush -Dressings applied.  Post care instructions provided -Return to clinic as needed       Felecia Shelling, DPM [facility]  Dr. Felecia Shelling, DPM    [address] [place], [place] [address]                Office [phone]  Fax [phone]

## 2023-03-24 ENCOUNTER — Encounter: Payer: Self-pay | Admitting: Internal Medicine

## 2023-03-31 ENCOUNTER — Other Ambulatory Visit: Payer: Self-pay | Admitting: Internal Medicine

## 2023-03-31 ENCOUNTER — Encounter: Payer: Self-pay | Admitting: Internal Medicine

## 2023-03-31 DIAGNOSIS — G8929 Other chronic pain: Secondary | ICD-10-CM

## 2023-03-31 DIAGNOSIS — M5137 Other intervertebral disc degeneration, lumbosacral region: Secondary | ICD-10-CM

## 2023-04-06 ENCOUNTER — Encounter: Payer: Self-pay | Admitting: Internal Medicine

## 2023-04-15 ENCOUNTER — Ambulatory Visit: Payer: 59 | Attending: Pulmonary Disease | Admitting: Pulmonary Disease

## 2023-04-15 DIAGNOSIS — G4763 Sleep related bruxism: Secondary | ICD-10-CM | POA: Diagnosis not present

## 2023-04-15 DIAGNOSIS — G4733 Obstructive sleep apnea (adult) (pediatric): Secondary | ICD-10-CM | POA: Diagnosis present

## 2023-04-27 ENCOUNTER — Other Ambulatory Visit: Payer: Self-pay | Admitting: Internal Medicine

## 2023-04-27 DIAGNOSIS — G8929 Other chronic pain: Secondary | ICD-10-CM

## 2023-04-27 DIAGNOSIS — M51379 Other intervertebral disc degeneration, lumbosacral region without mention of lumbar back pain or lower extremity pain: Secondary | ICD-10-CM

## 2023-04-30 ENCOUNTER — Telehealth: Payer: Self-pay | Admitting: Pulmonary Disease

## 2023-04-30 DIAGNOSIS — G4733 Obstructive sleep apnea (adult) (pediatric): Secondary | ICD-10-CM

## 2023-04-30 NOTE — Procedures (Signed)
Patient Name: [name], [name] Date: 04/15/2023 Gender: Female D.O.B: 06/13/1966 Age (years): 72 Referring Provider: Cyril Mourning MD, ABSM Height (inches): 61 Interpreting Physician: Cyril Mourning MD, ABSM Weight (lbs): 178 RPSGT: Alfonso Ellis BMI: 34 MRN: [id number] Neck Size: 19.50 <br> <br> CLINICAL INFORMATION Sleep Study Type: NPSG    Indication for sleep study: snoring, non refreshing sleep    Epworth Sleepiness Score: 5    SLEEP STUDY TECHNIQUE As per the AASM Manual for the Scoring of Sleep and Associated Events v2.3 (April 2016) with a hypopnea requiring 4% desaturations.  The channels recorded and monitored were frontal, central and occipital EEG, electrooculogram (EOG), submentalis EMG (chin), nasal and oral airflow, thoracic and abdominal wall motion, anterior tibialis EMG, snore microphone, electrocardiogram, and pulse oximetry.  MEDICATIONS Medications self-administered by patient taken the night of the study : HYDROCODONE W/ACETAMINOPHEN  SLEEP ARCHITECTURE The study was initiated at 10:34:19 PM and ended at 5:06:38 AM.  Sleep onset time was 10.8 minutes and the sleep efficiency was 89.2%. The total sleep time was 350 minutes.  Stage REM latency was 74.0 minutes.  The patient spent 4.14% of the night in stage N1 sleep, 34.15% in stage N2 sleep, 42.42% in stage N3 and 19.3% in REM.  Alpha intrusion was absent.  Supine sleep was 14.14%.  RESPIRATORY PARAMETERS The overall apnea/hypopnea index (AHI) was 10.8 per hour. There were 4 total apneas, including 4 obstructive, 0 central and 0 mixed apneas. There were 59 hypopneas and 8 RERAs.  The AHI during Stage REM sleep was 32.0 per hour.  AHI while supine was 27.9 per hour.  The mean oxygen saturation was 91.34%. The minimum SpO2 during sleep was 86.00%.  loud snoring was noted during this study.  CARDIAC DATA The 2 lead EKG demonstrated sinus rhythm. The mean heart rate was 52.03 beats per  minute. Other EKG findings include: PVCs.   LEG MOVEMENT DATA The total PLMS were 344 with a resulting PLMS index of 58.97. Associated arousal with leg movement index was 3.6 .  IMPRESSIONS - Mild obstructive sleep apnea occurred during this study (AHI = 10.8/h). Events were predominant hypopneas during REM sleep - Mild oxygen desaturation was noted during this study (Min O2 = 86.00%). - The patient snored with loud snoring volume. - EKG findings include PVCs. - Severe limb movements of sleep occurred during the study. No significant associated arousals. - Bruxism was noted   DIAGNOSIS - Obstructive Sleep Apnea (G47.33) - Bruxism (G47.63)   RECOMMENDATIONS - Treatment options include dental appliance or CPAP therapy - Positional therapy avoiding supine position during sleep. - Consider oral bite guard for bruxism - Avoid alcohol, sedatives and other CNS depressants that may worsen sleep apnea and disrupt normal sleep architecture. - Sleep hygiene should be reviewed to assess factors that may improve sleep quality. - Weight management and regular exercise should be initiated or continued if appropriate.  [Electronically signed] 04/30/2023 05:28 PM  Cyril Mourning MD, ABSM Diplomate, American Board of Sleep Medicine NPI: [id number]

## 2023-04-30 NOTE — Telephone Encounter (Signed)
Sleep study showed mild OSA with AHI 10/ hr Events were predominantly noted during REM sleep.  There was also some teeth grinding and leg movements  Since we had discussed that we would proceed with CPAP therapy I think she would do well with a trial Suggest autoCPAP  5-15 cm, mask of choice OV with me/APP in 8 wks

## 2023-05-01 ENCOUNTER — Other Ambulatory Visit: Payer: Self-pay

## 2023-05-01 DIAGNOSIS — R0683 Snoring: Secondary | ICD-10-CM

## 2023-05-01 DIAGNOSIS — G4733 Obstructive sleep apnea (adult) (pediatric): Secondary | ICD-10-CM

## 2023-05-01 NOTE — Telephone Encounter (Signed)
Called and spoke with pt informed patient of her result of her sleep study , pt said that she is willing to start c-pap and would like this sent [employer]. . Placed order for c-pap and sent to [facility] per patient request.

## 2023-05-05 NOTE — Telephone Encounter (Signed)
Patient called [facility] regarding her new CPAP equipment and they are no longer billing her insurances, [employer] and [place] [employer].  She needs the order for her equipment to be faxed to a company that does take her insurances, Please call her at [phone]

## 2023-05-05 NOTE — Telephone Encounter (Signed)
Updated order placed.

## 2023-05-11 NOTE — Telephone Encounter (Signed)
Patient has not heard from anyone regarding this request.  Please call her at [phone]

## 2023-05-20 ENCOUNTER — Other Ambulatory Visit: Payer: Self-pay

## 2023-05-20 ENCOUNTER — Emergency Department (HOSPITAL_COMMUNITY): Payer: 59

## 2023-05-20 ENCOUNTER — Emergency Department (HOSPITAL_COMMUNITY)
Admission: EM | Admit: 2023-05-20 | Discharge: 2023-05-20 | Disposition: A | Discharge status: Home / Self Care | Payer: 59 | Attending: Emergency Medicine | Admitting: Emergency Medicine

## 2023-05-20 ENCOUNTER — Telehealth (HOSPITAL_COMMUNITY): Payer: Self-pay | Admitting: Emergency Medicine

## 2023-05-20 DIAGNOSIS — R0602 Shortness of breath: Secondary | ICD-10-CM | POA: Diagnosis present

## 2023-05-20 DIAGNOSIS — I10 Essential (primary) hypertension: Secondary | ICD-10-CM | POA: Insufficient documentation

## 2023-05-20 DIAGNOSIS — Z79899 Other long term (current) drug therapy: Secondary | ICD-10-CM | POA: Insufficient documentation

## 2023-05-20 DIAGNOSIS — J209 Acute bronchitis, unspecified: Secondary | ICD-10-CM | POA: Insufficient documentation

## 2023-05-20 MED ORDER — AMOXICILLIN-POT CLAVULANATE 875-125 MG PO TABS
1.0000 | ORAL_TABLET | Freq: Once | ORAL | Status: AC
  Administered 2023-05-20: 1 via ORAL
  Filled 2023-05-20: qty 1

## 2023-05-20 MED ORDER — BENZONATATE 100 MG PO CAPS
100.0000 mg | ORAL_CAPSULE | Freq: Three times a day (TID) | ORAL | Status: DC | PRN
  Administered 2023-05-20: 100 mg via ORAL
  Filled 2023-05-20: qty 1

## 2023-05-20 MED ORDER — BENZONATATE 100 MG PO CAPS: 100.0000 mg | ORAL_CAPSULE | Freq: Three times a day (TID) | ORAL | 0 refills | Status: DC | PRN

## 2023-05-20 MED ORDER — AMOXICILLIN-POT CLAVULANATE 875-125 MG PO TABS: 1.0000 | ORAL_TABLET | Freq: Two times a day (BID) | ORAL | 0 refills | Status: DC

## 2023-05-20 NOTE — ED Notes (Signed)
Patient transported to X-ray 

## 2023-05-20 NOTE — ED Provider Notes (Signed)
[facility] AT [facility] Provider Note   CSN: [id number] Arrival date & time: 05/20/23  7846     History  Chief Complaint  Patient presents with   Shortness of Breath    [name] is a 57 y.o. female.  Patient is a 57 year old female with past medical history of fibromyalgia, depression, PTSD, anxiety, borderline personality, hypertension, irritable bowel.  Patient presenting today for evaluation of cough and congestion.  This has been worsening over the past 5 days.  She has inhalers at home which are only helping very little.  Is having trouble sleeping secondary to her cough.  No fevers or chills.  No ill contacts.  The history is provided by the patient.       Home Medications Prior to Admission medications   Medication Sig Start Date End Date Taking? Authorizing Provider  albuterol (VENTOLIN HFA) 108 (90 Base) MCG/ACT inhaler Inhale 2 puffs into the lungs every 6 (six) hours as needed for wheezing or shortness of breath. 11/04/22   Billie Lade, MD  amLODipine (NORVASC) 10 MG tablet Take 1 tablet (10 mg total) by mouth daily. 03/06/23   Billie Lade, MD  ARIPiprazole (ABILIFY) 5 MG tablet Take 1 tablet (5 mg total) by mouth at bedtime. 09/30/22 03/06/23  Elsie Lincoln, MD  cetirizine (ZYRTEC ALLERGY) 10 MG tablet Take 1 tablet (10 mg total) by mouth daily. 11/17/22   Gardenia Phlegm, MD  clobetasol ointment (TEMOVATE) 0.05 % Apply to affected area every night for 4 weeks, then every other day for 4 weeks and then twice a week for 4 weeks or until resolution. 06/23/22   Constant, Peggy, MD  EPINEPHrine 0.3 mg/0.3 mL IJ SOAJ injection Inject 0.3 mg into the muscle as needed for anaphylaxis. Inject into the muscle. 04/09/22   Billie Lade, MD  HYDROcodone-acetaminophen (NORCO) 5-325 MG tablet Take 1 tablet by mouth every 6 (six) hours as needed. 02/25/23   Billie Lade, MD  losartan-hydrochlorothiazide (HYZAAR) 100-25 MG tablet Take 1  tablet by mouth daily. 03/06/23   Billie Lade, MD  methylPREDNISolone (MEDROL DOSEPAK) 4 MG TBPK tablet Use as directed. Patient not taking: Reported on 03/17/2023 02/11/23   Billie Lade, MD  mirabegron ER (MYRBETRIQ) 25 MG TB24 tablet Take 1 tablet (25 mg total) by mouth daily. 10/07/22 03/06/23  Billie Lade, MD  rosuvastatin (CRESTOR) 10 MG tablet Take 1 tablet (10 mg total) by mouth daily. 04/11/22   Billie Lade, MD  spironolactone (ALDACTONE) 50 MG tablet Take 1 tablet (50 mg total) by mouth daily. 03/06/23   Billie Lade, MD  tiZANidine (ZANAFLEX) 4 MG tablet TAKE 1 TABLET(4 MG) BY MOUTH EVERY 6 HOURS FOR UP TO 60 DOSES AS NEEDED FOR MUSCLE SPASMS 04/27/23   Billie Lade, MD      Allergies    Barium sulfate, Barium-containing compounds, Bee venom, Haemophilus influenzae, Influenza virus vaccine, Seldane [terfenadine], Sulfa antibiotics, Amitriptyline, Lisinopril, Lithium, Tetracyclines & related, Celebrex [celecoxib], Dexamethasone, Metformin and related, Prednisone, Trazodone and nefazodone, Zinc, Aspirin, Ativan [lorazepam], Erythromycin, and Lipitor [atorvastatin]    Review of Systems   Review of Systems  All other systems reviewed and are negative.   Physical Exam Updated Vital Signs BP (!) 222/95 Comment: Pt reports has not taken her BP meds this am  Pulse 77   Temp 98.1 F (36.7 C)   Resp (!) 24   SpO2 98%  Physical Exam Vitals and  nursing note reviewed.  Constitutional:      General: She is not in acute distress.    Appearance: She is well-developed. She is not diaphoretic.  HENT:     Head: Normocephalic and atraumatic.  Cardiovascular:     Rate and Rhythm: Normal rate and regular rhythm.     Heart sounds: No murmur heard.    No friction rub. No gallop.  Pulmonary:     Effort: Pulmonary effort is normal. No respiratory distress.     Breath sounds: Normal breath sounds. No wheezing.  Abdominal:     General: Bowel sounds are normal. There is no  distension.     Palpations: Abdomen is soft.     Tenderness: There is no abdominal tenderness.  Musculoskeletal:        General: Normal range of motion.     Cervical back: Normal range of motion and neck supple.  Skin:    General: Skin is warm and dry.  Neurological:     General: No focal deficit present.     Mental Status: She is alert and oriented to person, place, and time.     ED Results / Procedures / Treatments   Labs (all labs ordered are listed, but only abnormal results are displayed) Labs Reviewed - No data to display  EKG None  Radiology No results found.  Procedures Procedures    Medications Ordered in ED Medications - No data to display  ED Course/ Medical Decision Making/ A&P  Patient is a 57 year old female presenting with complaints of URI symptoms for the past week.  She describes productive cough.  Chest x-ray shows no acute process.  Patient to be treated for bronchitis with Augmentin.  She has to follow-up with primary doctor if not improving.  Final Clinical Impression(s) / ED Diagnoses Final diagnoses:  None    Rx / DC Orders ED Discharge Orders     None         Geoffery Lyons, MD 05/20/23 [id number]

## 2023-05-20 NOTE — ED Notes (Signed)
05/20/2023 1210 called and spoke with pt and informed her that her medications are to be filled and if she has any problems to call back  Dr. Durwin Nora informed of need for prescription medications to be sent to [facility]

## 2023-05-20 NOTE — Telephone Encounter (Signed)
Patient seen overnight for cough and congestion.  She was instructed to take Augmentin and Tessalon at time of discharge.  However, no prescriptions were provided.  She called the ED to inform.  Medication now sent to patient's pharmacy of choice.

## 2023-05-20 NOTE — Discharge Instructions (Signed)
Begin taking Augmentin as prescribed.  Been taking Tessalon as prescribed as needed for cough.  Plenty of fluids and get plenty of rest.  Follow-up with primary doctor if not improving in the next few days.

## 2023-05-20 NOTE — ED Triage Notes (Signed)
Pt c/o SOB and productive cough x5 days. Hx of COPD

## 2023-05-21 ENCOUNTER — Ambulatory Visit: Payer: 59 | Admitting: Family Medicine

## 2023-05-26 ENCOUNTER — Other Ambulatory Visit: Payer: Self-pay | Admitting: Internal Medicine

## 2023-05-26 ENCOUNTER — Ambulatory Visit: Payer: 59 | Admitting: Internal Medicine

## 2023-05-26 DIAGNOSIS — M51379 Other intervertebral disc degeneration, lumbosacral region without mention of lumbar back pain or lower extremity pain: Secondary | ICD-10-CM

## 2023-05-26 DIAGNOSIS — G8929 Other chronic pain: Secondary | ICD-10-CM

## 2023-06-09 ENCOUNTER — Ambulatory Visit (INDEPENDENT_AMBULATORY_CARE_PROVIDER_SITE_OTHER): Payer: 59 | Admitting: Internal Medicine

## 2023-06-09 ENCOUNTER — Encounter: Payer: Self-pay | Admitting: Internal Medicine

## 2023-06-09 VITALS — BP 169/95 | HR 76 | Temp 97.9°F | Resp 20 | Ht 61.0 in | Wt 174.4 lb

## 2023-06-09 DIAGNOSIS — I10 Essential (primary) hypertension: Secondary | ICD-10-CM | POA: Diagnosis not present

## 2023-06-09 DIAGNOSIS — N951 Menopausal and female climacteric states: Secondary | ICD-10-CM | POA: Insufficient documentation

## 2023-06-09 DIAGNOSIS — Z2821 Immunization not carried out because of patient refusal: Secondary | ICD-10-CM | POA: Diagnosis not present

## 2023-06-09 DIAGNOSIS — E782 Mixed hyperlipidemia: Secondary | ICD-10-CM

## 2023-06-09 DIAGNOSIS — J449 Chronic obstructive pulmonary disease, unspecified: Secondary | ICD-10-CM | POA: Diagnosis not present

## 2023-06-09 DIAGNOSIS — N3281 Overactive bladder: Secondary | ICD-10-CM

## 2023-06-09 DIAGNOSIS — E66811 Obesity, class 1: Secondary | ICD-10-CM

## 2023-06-09 DIAGNOSIS — E559 Vitamin D deficiency, unspecified: Secondary | ICD-10-CM

## 2023-06-09 DIAGNOSIS — N3941 Urge incontinence: Secondary | ICD-10-CM

## 2023-06-09 DIAGNOSIS — R7303 Prediabetes: Secondary | ICD-10-CM

## 2023-06-09 DIAGNOSIS — M25562 Pain in left knee: Secondary | ICD-10-CM | POA: Insufficient documentation

## 2023-06-09 MED ORDER — SPIRONOLACTONE 50 MG PO TABS
50.0000 mg | ORAL_TABLET | Freq: Every day | ORAL | 1 refills | Status: DC
Start: 2023-06-09 — End: 2024-01-05

## 2023-06-09 MED ORDER — LOSARTAN POTASSIUM-HCTZ 100-25 MG PO TABS
1.0000 | ORAL_TABLET | Freq: Every day | ORAL | 1 refills | Status: DC
Start: 2023-06-09 — End: 2023-12-23

## 2023-06-09 MED ORDER — AMLODIPINE BESYLATE 10 MG PO TABS
10.0000 mg | ORAL_TABLET | Freq: Every day | ORAL | 1 refills | Status: DC
Start: 2023-06-09 — End: 2023-12-23

## 2023-06-09 MED ORDER — VEOZAH 45 MG PO TABS
1.0000 | ORAL_TABLET | Freq: Every day | ORAL | 2 refills | Status: DC
Start: 2023-06-09 — End: 2023-09-10

## 2023-06-09 MED ORDER — MELOXICAM 7.5 MG PO TABS: 7.5000 mg | ORAL_TABLET | Freq: Every day | ORAL | 0 refills | Status: DC

## 2023-06-09 MED ORDER — GEMTESA 75 MG PO TABS
75.0000 mg | ORAL_TABLET | Freq: Every day | ORAL | 2 refills | Status: DC
Start: 2023-06-09 — End: 2023-09-10

## 2023-06-09 NOTE — Assessment & Plan Note (Signed)
She endorses a 3-day history of left knee pain.  No inciting event or trauma at the onset of pain.  Medial knee pain is elicited with application of varus stress and with flexion beyond 90 degrees.  She is currently managing pain with arthritis strength Tylenol. -Meloxicam 7.5 mg daily prescribed today -Orthopedic surgery referral placed for further evaluation

## 2023-06-09 NOTE — Progress Notes (Signed)
Established Patient Office Visit  Subjective   Patient ID: [name], female    DOB: 01-Feb-1966  Age: 57 y.o. MRN: [id number]  Chief Complaint  Patient presents with   Hypertension   Medical Management of Chronic Issues   [name] returns to care today for routine follow-up.  She was last evaluated by me on 8/9 for ER follow-up.  In the interim, she has been evaluated by podiatry and pulmonology.  ED presentation on 10/23 endorsing URI symptoms.  Treated with Augmentin for suspected bronchitis.  There have otherwise been no acute interval events.  [name] has multiple acute concerns to discuss today.  She endorses a 3-day history of left knee pain.  There was no inciting event or trauma at the onset of pain.  She endorses pain with ambulation and feels like her leg is catching when attempting to fully extend.  She has been taking arthritis strength Tylenol without significant pain relief.  Her additional concern are hot flashes.  Symptoms mostly occur at night.  She is having to shower twice daily due to sweating profusely.  She is interested in treatment options.  Past Medical History:  Diagnosis Date   Acute pansinusitis 05/03/2019   Anxiety    Arthritis    "pretty much all over; mainly neck and back" (12/26/2015)   Asthma    Atypical squamous cell changes of undetermined significance (ASCUS) on vaginal cytology 04/11/2022   Bipolar 1 disorder (HCC)    Bipolar disorder (HCC)    Bipolar I disorder, most recent episode depressed (HCC) 07/04/2014   Cervical cancer (HCC)    "stage I"   Chronic back pain    Chronic bronchitis (HCC)    Colon cancer screening 02/15/2018   COPD (chronic obstructive pulmonary disease) (HCC)    Degenerative disc disease    Depression    Dysrhythmia    Fibromyalgia    GERD (gastroesophageal reflux disease)    Hepatic cyst    High cholesterol    History of hiatal hernia    Hypertension 08/04/2012   IBS (irritable bowel syndrome) Diagnosed  November 2012   Incisional hernia with gangrene and obstruction 12/26/2015   Melanoma of back (HCC)    Ovarian cancer (HCC)    "stage II"   Plantar fasciitis, bilateral    Preventative health care 04/11/2022   Sleep apnea    cpap coming  "soon" (12/26/2015)   Past Surgical History:  Procedure Laterality Date   ABDOMINAL HYSTERECTOMY  1997   APPENDECTOMY     CARDIAC CATHETERIZATION  2015   CARPAL TUNNEL RELEASE Right    CESAREAN SECTION  1987   CHOLECYSTECTOMY OPEN  1998   COLONOSCOPY  November 2012   EYE SURGERY     CATARACTS REMOVED 09/09/17 and 09/16/17   HERNIA REPAIR     INCISIONAL HERNIA REPAIR N/A 12/26/2015   Procedure: LAPAROSCOPIC REPAIR INCISIONAL HERNIA WITH MESH;  Surgeon: Claud Kelp, MD;  Location: [facility] OR;  Service: General;  Laterality: N/A;   INSERTION OF MESH N/A 12/26/2015   Procedure: INSERTION OF MESH;  Surgeon: Claud Kelp, MD;  Location: [facility] OR;  Service: General;  Laterality: N/A;   LAPAROSCOPIC ABDOMINAL EXPLORATION  1997   LAPAROSCOPIC INCISIONAL / UMBILICAL / VENTRAL HERNIA REPAIR  12/26/2015   repair of incarcerated incisional hernia with mesh   LIVER CYST REMOVAL  2014   MELANOMA EXCISION  January 2013   removed from back   SHOULDER SURGERY Left 2010   Humeral Head Microfracture  TUBAL LIGATION     UPPER GASTROINTESTINAL ENDOSCOPY  November 2012   Social History   Tobacco Use   Smoking status: Former    Current packs/day: 0.00    Types: Cigarettes, E-cigarettes    Quit date: 06/12/2018    Years since quitting: 4.9   Smokeless tobacco: Never   Tobacco comments:    Smokes hemp every night to help with her pain  Vaping Use   Vaping status: Every Day   Start date: 04/18/2019   Substances: Nicotine-salt  Substance Use Topics   Alcohol use: No    Comment: 12/26/2015 'quit in ~ 1997"   Drug use: Yes    Types: Other-see comments, Marijuana    Comment: 12/26/2015 "quit in ~  2010".   smokes CBD with 0.3% THC nightly   Family History   Adopted: Yes  Problem Relation Age of Onset   Bipolar disorder Mother        never diagnosed but patient suspects mother had bipolar disorder.Marland KitchenMarland KitchenMarland KitchenMarland Kitchenpt was adopeted, only knew birth mother for a short period of time.   Anxiety disorder Mother    Cirrhosis Mother    Diabetes Mother    Turner syndrome Daughter    Cancer Daughter    Anxiety disorder Daughter    Bipolar disorder Daughter    Interstitial cystitis Daughter    ADD / ADHD Neg Hx    Alcohol abuse Neg Hx    Drug abuse Neg Hx    Dementia Neg Hx    Depression Neg Hx    OCD Neg Hx    Paranoid behavior Neg Hx    Schizophrenia Neg Hx    Seizures Neg Hx    Sexual abuse Neg Hx    Physical abuse Neg Hx    Colon cancer Neg Hx    Allergies  Allergen Reactions   Barium Sulfate Nausea And Vomiting    Stomach cramps, extreme diarrhea, and vomiting   Barium-Containing Compounds Other (See Comments)    Stomach cramps, extreme diarrhea, and vomiting   Bee Venom Anaphylaxis   Haemophilus Influenzae Swelling    Trouble swallowing   Influenza Virus Vaccine Swelling    Trouble swallowing Trouble swallowing    Seldane [Terfenadine] Nausea And Vomiting and Other (See Comments)    CAUSES TOP LAYER OF SKIN TO PEEL   Sulfa Antibiotics Anaphylaxis   Amitriptyline     Other reaction(s): Other (See Comments) Mood instability   Lisinopril Cough   Lithium Other (See Comments)    Agitation and hostility   Tetracyclines & Related Hives and Nausea And Vomiting   Celebrex [Celecoxib] Nausea And Vomiting   Dexamethasone     Elevated blood sugar,nausea,diarrhea, muscle weakness   Metformin And Related Nausea And Vomiting   Prednisone     Other reaction(s): Generalized Edema (intolerance)   Trazodone And Nefazodone     Sick   Zinc Nausea And Vomiting   Aspirin Rash and Other (See Comments)    Upset stomach   Ativan [Lorazepam] Other (See Comments)    Irritability   Erythromycin Nausea And Vomiting, Rash and Other (See Comments)     Cramps   Lipitor [Atorvastatin] Nausea And Vomiting   Review of Systems  Genitourinary:        OAB  Musculoskeletal:  Positive for back pain, joint pain (L knee pain) and neck pain.  Endo/Heme/Allergies:        Hot flashes  All other systems reviewed and are negative.    Objective:  BP (!) 169/95   Pulse 76   Temp 97.9 F (36.6 C) (Oral)   Resp 20   Ht 5\' 1"  (1.549 m)   Wt 174 lb 6 oz (79.1 kg)   SpO2 96%   BMI 32.95 kg/m  BP Readings from Last 3 Encounters:  06/09/23 (!) 169/95  05/20/23 (!) 155/73  03/06/23 (!) 140/80   Physical Exam Vitals reviewed.  Constitutional:      General: She is not in acute distress.    Appearance: Normal appearance. She is obese. She is not toxic-appearing.  HENT:     Head: Normocephalic and atraumatic.     Right Ear: External ear normal.     Left Ear: External ear normal.     Nose: Nose normal. No congestion or rhinorrhea.     Mouth/Throat:     Mouth: Mucous membranes are moist.     Pharynx: Oropharynx is clear. No oropharyngeal exudate or posterior oropharyngeal erythema.  Eyes:     General: No scleral icterus.    Extraocular Movements: Extraocular movements intact.     Conjunctiva/sclera: Conjunctivae normal.     Pupils: Pupils are equal, round, and reactive to light.  Cardiovascular:     Rate and Rhythm: Normal rate and regular rhythm.     Pulses: Normal pulses.     Heart sounds: Normal heart sounds. No murmur heard.    No friction rub. No gallop.  Pulmonary:     Effort: Pulmonary effort is normal.     Breath sounds: Normal breath sounds. No wheezing, rhonchi or rales.  Abdominal:     General: Abdomen is flat. Bowel sounds are normal. There is no distension.     Palpations: Abdomen is soft.     Tenderness: There is no abdominal tenderness.  Musculoskeletal:        General: No swelling.     Cervical back: Normal range of motion.     Right lower leg: No edema.     Left lower leg: No edema.     Comments: No obvious  deformity or swelling is present on inspection of the left knee.  Pain is elicited with flexion beyond 90 degrees.  Medial knee pain is elicited with application of varus stress.  Negative Lachman/anterior drawer.  Positive McMurray's.  Lymphadenopathy:     Cervical: No cervical adenopathy.  Skin:    General: Skin is warm and dry.     Capillary Refill: Capillary refill takes less than 2 seconds.     Coloration: Skin is not jaundiced.  Neurological:     General: No focal deficit present.     Mental Status: She is alert and oriented to person, place, and time.     Gait: Gait abnormal (Ambulates with cane).  Psychiatric:        Mood and Affect: Mood normal.        Behavior: Behavior normal.   Last CBC Lab Results  Component Value Date   WBC 8.5 11/17/2022   HGB 14.3 11/17/2022   HCT 43.5 11/17/2022   MCV 86 11/17/2022   MCH 28.3 11/17/2022   RDW 13.1 11/17/2022   PLT 318 11/17/2022   Last metabolic panel Lab Results  Component Value Date   GLUCOSE 86 11/17/2022   NA 141 11/17/2022   K 3.8 11/17/2022   CL 101 11/17/2022   CO2 22 11/17/2022   BUN 12 11/17/2022   CREATININE 0.76 11/17/2022   EGFR 91 11/17/2022   CALCIUM 9.1 11/17/2022   PROT 7.0 11/17/2022  ALBUMIN 4.3 11/17/2022   LABGLOB 2.7 11/17/2022   AGRATIO 1.6 11/17/2022   BILITOT 0.7 11/17/2022   ALKPHOS 90 11/17/2022   AST 23 11/17/2022   ALT 26 11/17/2022   ANIONGAP 8 12/08/2017   Last lipids Lab Results  Component Value Date   CHOL 150 07/08/2022   HDL 42 07/08/2022   LDLCALC 87 07/08/2022   TRIG 115 07/08/2022   CHOLHDL 3.6 07/08/2022   Last hemoglobin A1c Lab Results  Component Value Date   HGBA1C 5.7 (H) 04/09/2022   Last thyroid functions Lab Results  Component Value Date   TSH 1.110 04/09/2022   T3TOTAL 138.6 02/08/2014   Last vitamin D Lab Results  Component Value Date   VD25OH 25.0 (L) 04/09/2022   Last vitamin B12 and Folate Lab Results  Component Value Date   VITAMINB12 419  04/09/2022   FOLATE 6.6 04/09/2022   The 10-year ASCVD risk score (Arnett DK, et al., 2019) is: 6%    Assessment & Plan:   Problem List Items Addressed This Visit       Essential hypertension    Poorly controlled.  Reports that she has been out of losartan-HCTZ for the last month.  She is additionally prescribed amlodipine 10 mg daily and spironolactone 50 mg daily. -BP medications were refilled.  Nurse visit in 2 weeks for BP check.      Urge incontinence    Myrbetriq was previously effective in controlling symptoms of urge incontinence/OAB.  Reports today that it is not working effectively. -Start Gemtesa 75 mg daily.      Vasomotor symptoms due to menopause    She describes hot flashes and profuse sweating.  Start Veozah 45 mg daily for vasomotor symptoms.  Repeat labs have been ordered today.      Acute pain of left knee    She endorses a 3-day history of left knee pain.  No inciting event or trauma at the onset of pain.  Medial knee pain is elicited with application of varus stress and with flexion beyond 90 degrees.  She is currently managing pain with arthritis strength Tylenol. -Meloxicam 7.5 mg daily prescribed today -Orthopedic surgery referral placed for further evaluation      Return in about 3 months (around 09/09/2023).   Billie Lade, MD

## 2023-06-09 NOTE — Assessment & Plan Note (Signed)
She describes hot flashes and profuse sweating.  Start Veozah 45 mg daily for vasomotor symptoms.  Repeat labs have been ordered today.

## 2023-06-09 NOTE — Assessment & Plan Note (Signed)
Poorly controlled.  Reports that she has been out of losartan-HCTZ for the last month.  She is additionally prescribed amlodipine 10 mg daily and spironolactone 50 mg daily. -BP medications were refilled.  Nurse visit in 2 weeks for BP check.

## 2023-06-09 NOTE — Assessment & Plan Note (Signed)
Myrbetriq was previously effective in controlling symptoms of urge incontinence/OAB.  Reports today that it is not working effectively. -Start Gemtesa 75 mg daily.

## 2023-06-09 NOTE — Patient Instructions (Signed)
It was a pleasure to see you today.  Thank you for giving [place] the opportunity to be involved in your care.  Below is a brief recap of your visit and next steps.  We will plan to see you again in 3 months.  Summary BP medications refilled Meloxicam prescribed for left knee pain Switch to Gemtesa for bladder issues Try Veozah for vasomotor symptoms  Repeat labs 2 weeks for BP check Routine follow up in 3 months

## 2023-06-11 LAB — LIPID PANEL
Chol/HDL Ratio: 4.3 ratio (ref 0.0–4.4)
Cholesterol, Total: 199 mg/dL (ref 100–199)
HDL: 46 mg/dL (ref >39)
LDL Chol Calc (NIH): 134 mg/dL — ABNORMAL HIGH (ref 0–99)
Triglycerides: 107 mg/dL (ref 0–149)
VLDL Cholesterol Cal: 19 mg/dL (ref 5–40)

## 2023-06-11 LAB — CBC WITH DIFFERENTIAL/PLATELET
Basophils Absolute: 0.1 10*3/uL (ref 0.0–0.2)
Basos: 1 %
EOS (ABSOLUTE): 0.5 10*3/uL — ABNORMAL HIGH (ref 0.0–0.4)
Eos: 6 %
Hematocrit: 44.4 % (ref 34.0–46.6)
Hemoglobin: 14.3 g/dL (ref 11.1–15.9)
Immature Grans (Abs): 0 10*3/uL (ref 0.0–0.1)
Immature Granulocytes: 0 %
Lymphocytes Absolute: 2.2 10*3/uL (ref 0.7–3.1)
Lymphs: 26 %
MCH: 27.9 pg (ref 26.6–33.0)
MCHC: 32.2 g/dL (ref 31.5–35.7)
MCV: 87 fL (ref 79–97)
Monocytes Absolute: 0.5 10*3/uL (ref 0.1–0.9)
Monocytes: 6 %
Neutrophils Absolute: 5.1 10*3/uL (ref 1.4–7.0)
Neutrophils: 61 %
Platelets: 341 10*3/uL (ref 150–450)
RBC: 5.12 x10E6/uL (ref 3.77–5.28)
RDW: 13.3 % (ref 11.7–15.4)
WBC: 8.5 10*3/uL (ref 3.4–10.8)

## 2023-06-11 LAB — TSH+FREE T4
Free T4: 1.13 ng/dL (ref 0.82–1.77)
TSH: 1.71 u[IU]/mL (ref 0.450–4.500)

## 2023-06-11 LAB — CMP14+EGFR
ALT: 19 [IU]/L (ref 0–32)
AST: 19 IU/L (ref 0–40)
Albumin: 4.3 g/dL (ref 3.8–4.9)
Alkaline Phosphatase: 104 [IU]/L (ref 44–121)
BUN/Creatinine Ratio: 16 (ref 9–23)
BUN: 12 mg/dL (ref 6–24)
Bilirubin Total: 0.5 mg/dL (ref 0.0–1.2)
CO2: 21 mmol/L (ref 20–29)
Calcium: 9.8 mg/dL (ref 8.7–10.2)
Chloride: 102 mmol/L (ref 96–106)
Creatinine, Ser: 0.74 mg/dL (ref 0.57–1.00)
Globulin, Total: 2.9 g/dL (ref 1.5–4.5)
Glucose: 99 mg/dL (ref 70–99)
Potassium: 4.1 mmol/L (ref 3.5–5.2)
Sodium: 141 mmol/L (ref 134–144)
Total Protein: 7.2 g/dL (ref 6.0–8.5)
eGFR: 94 mL/min/{1.73_m2} (ref >59)

## 2023-06-11 LAB — HEMOGLOBIN A1C
Est. average glucose Bld gHb Est-mCnc: 120 mg/dL
Hgb A1c MFr Bld: 5.8 % — ABNORMAL HIGH (ref 4.8–5.6)

## 2023-06-11 LAB — B12 AND FOLATE PANEL
Folate: 3.3 ng/mL (ref >3.0)
Vitamin B-12: 431 pg/mL (ref 232–1245)

## 2023-06-11 LAB — VITAMIN D 25 HYDROXY (VIT D DEFICIENCY, FRACTURES): Vit D, 25-Hydroxy: 25.1 ng/mL — ABNORMAL LOW (ref 30.0–100.0)

## 2023-06-17 ENCOUNTER — Ambulatory Visit: Payer: 59 | Admitting: Orthopedic Surgery

## 2023-06-17 ENCOUNTER — Ambulatory Visit (INDEPENDENT_AMBULATORY_CARE_PROVIDER_SITE_OTHER): Payer: 59

## 2023-06-17 ENCOUNTER — Encounter: Payer: Self-pay | Admitting: Orthopedic Surgery

## 2023-06-17 ENCOUNTER — Ambulatory Visit: Payer: 59

## 2023-06-17 VITALS — BP 103/70 | HR 74 | Ht 61.0 in | Wt 173.8 lb

## 2023-06-17 DIAGNOSIS — M25562 Pain in left knee: Secondary | ICD-10-CM | POA: Diagnosis not present

## 2023-06-17 NOTE — Progress Notes (Signed)
New Patient Visit  Assessment: [name] is a 57 y.o. female with the following: 1. Acute pain of left knee  Plan: [name] is in pain in the anterior aspect of the left knee, for the past month.  No specific injury.  She does have a catching sensation in the primarily that anterior aspect of the knee.  This causes some shooting pains.  No tenderness along the medial or lateral joint line.  Medications have not been effective.  She is interested in a steroid injection.  This was completed in clinic today.  She noted almost instantaneous improvement in her symptoms.  Procedure note injection Left knee joint   Verbal consent was obtained to inject the left knee joint  Timeout was completed to confirm the site of injection.  The skin was prepped with alcohol and ethyl chloride was sprayed at the injection site.  A 21-gauge needle was used to inject 40 mg of Depo-Medrol and 1% lidocaine (4 cc) into the left knee using an anterolateral approach.  There were no complications. A sterile bandage was applied.    Follow-up: Return if symptoms worsen or fail to improve.  Subjective:  Chief Complaint  Patient presents with   Knee Pain    L knee pain for 1 mo no injury. States when she turns a certain way it sends a "white hot" pain into the knee     History of Present Illness: Tammy VUOLO is a 57 y.o. female who has been referred by Delmar Landau, MD for evaluation of left knee pain.  She said pain in the anterior aspect of the left knee for the past month.  No specific injury.  She does use a cane to assist with ambulation, primarily due to weakness in the right leg.  No prior injuries to her left knee.  Medications have been effective.  She has not had an injection.   Review of Systems: No fevers or chills No numbness or tingling No chest pain No shortness of breath No bowel or bladder dysfunction No GI distress No headaches   Medical History:  Past Medical History:   Diagnosis Date   Acute pansinusitis 05/03/2019   Anxiety    Arthritis    "pretty much all over; mainly neck and back" (12/26/2015)   Asthma    Atypical squamous cell changes of undetermined significance (ASCUS) on vaginal cytology 04/11/2022   Bipolar 1 disorder (HCC)    Bipolar disorder (HCC)    Bipolar I disorder, most recent episode depressed (HCC) 07/04/2014   Cervical cancer (HCC)    "stage I"   Chronic back pain    Chronic bronchitis (HCC)    Colon cancer screening 02/15/2018   COPD (chronic obstructive pulmonary disease) (HCC)    Degenerative disc disease    Depression    Dysrhythmia    Fibromyalgia    GERD (gastroesophageal reflux disease)    Hepatic cyst    High cholesterol    History of hiatal hernia    Hypertension 08/04/2012   IBS (irritable bowel syndrome) Diagnosed November 2012   Incisional hernia with gangrene and obstruction 12/26/2015   Melanoma of back (HCC)    Ovarian cancer (HCC)    "stage II"   Plantar fasciitis, bilateral    Preventative health care 04/11/2022   Sleep apnea    cpap coming  "soon" (12/26/2015)    Past Surgical History:  Procedure Laterality Date   ABDOMINAL HYSTERECTOMY  1997   APPENDECTOMY  CARDIAC CATHETERIZATION  2015   CARPAL TUNNEL RELEASE Right    CESAREAN SECTION  1987   CHOLECYSTECTOMY OPEN  1998   COLONOSCOPY  November 2012   EYE SURGERY     CATARACTS REMOVED 09/09/17 and 09/16/17   HERNIA REPAIR     INCISIONAL HERNIA REPAIR N/A 12/26/2015   Procedure: LAPAROSCOPIC REPAIR INCISIONAL HERNIA WITH MESH;  Surgeon: Claud Kelp, MD;  Location: [facility] OR;  Service: General;  Laterality: N/A;   INSERTION OF MESH N/A 12/26/2015   Procedure: INSERTION OF MESH;  Surgeon: Claud Kelp, MD;  Location: [facility] OR;  Service: General;  Laterality: N/A;   LAPAROSCOPIC ABDOMINAL EXPLORATION  1997   LAPAROSCOPIC INCISIONAL / UMBILICAL / VENTRAL HERNIA REPAIR  12/26/2015   repair of incarcerated incisional hernia with mesh   LIVER CYST  REMOVAL  2014   MELANOMA EXCISION  January 2013   removed from back   SHOULDER SURGERY Left 2010   Humeral Head Microfracture    TUBAL LIGATION     UPPER GASTROINTESTINAL ENDOSCOPY  November 2012    Family History  Adopted: Yes  Problem Relation Age of Onset   Bipolar disorder Mother        never diagnosed but patient suspects mother had bipolar disorder.Marland KitchenMarland KitchenMarland KitchenMarland Kitchenpt was adopeted, only knew birth mother for a short period of time.   Anxiety disorder Mother    Cirrhosis Mother    Diabetes Mother    Turner syndrome Daughter    Cancer Daughter    Anxiety disorder Daughter    Bipolar disorder Daughter    Interstitial cystitis Daughter    ADD / ADHD Neg Hx    Alcohol abuse Neg Hx    Drug abuse Neg Hx    Dementia Neg Hx    Depression Neg Hx    OCD Neg Hx    Paranoid behavior Neg Hx    Schizophrenia Neg Hx    Seizures Neg Hx    Sexual abuse Neg Hx    Physical abuse Neg Hx    Colon cancer Neg Hx    Social History   Tobacco Use   Smoking status: Former    Current packs/day: 0.00    Types: Cigarettes, E-cigarettes    Quit date: 06/12/2018    Years since quitting: 5.0   Smokeless tobacco: Never   Tobacco comments:    Smokes hemp every night to help with her pain  Vaping Use   Vaping status: Every Day   Start date: 04/18/2019   Substances: Nicotine-salt  Substance Use Topics   Alcohol use: No    Comment: 12/26/2015 'quit in ~ 1997"   Drug use: Yes    Types: Other-see comments, Marijuana    Comment: 12/26/2015 "quit in ~  2010".   smokes CBD with 0.3% THC nightly    Allergies  Allergen Reactions   Barium Sulfate Nausea And Vomiting    Stomach cramps, extreme diarrhea, and vomiting   Barium-Containing Compounds Other (See Comments)    Stomach cramps, extreme diarrhea, and vomiting   Bee Venom Anaphylaxis   Haemophilus Influenzae Swelling    Trouble swallowing   Influenza Virus Vaccine Swelling    Trouble swallowing Trouble swallowing    Seldane [Terfenadine] Nausea And  Vomiting and Other (See Comments)    CAUSES TOP LAYER OF SKIN TO PEEL   Sulfa Antibiotics Anaphylaxis   Amitriptyline     Other reaction(s): Other (See Comments) Mood instability   Lisinopril Cough   Lithium Other (See Comments)  Agitation and hostility   Tetracyclines & Related Hives and Nausea And Vomiting   Celebrex [Celecoxib] Nausea And Vomiting   Dexamethasone     Elevated blood sugar,nausea,diarrhea, muscle weakness   Metformin And Related Nausea And Vomiting   Prednisone     Other reaction(s): Generalized Edema (intolerance)   Trazodone And Nefazodone     Sick   Zinc Nausea And Vomiting   Aspirin Rash and Other (See Comments)    Upset stomach   Ativan [Lorazepam] Other (See Comments)    Irritability   Erythromycin Nausea And Vomiting, Rash and Other (See Comments)    Cramps   Lipitor [Atorvastatin] Nausea And Vomiting    Current Meds  Medication Sig   albuterol (VENTOLIN HFA) 108 (90 Base) MCG/ACT inhaler Inhale 2 puffs into the lungs every 6 (six) hours as needed for wheezing or shortness of breath.   amLODipine (NORVASC) 10 MG tablet Take 1 tablet (10 mg total) by mouth daily.   clobetasol ointment (TEMOVATE) 0.05 % Apply to affected area every night for 4 weeks, then every other day for 4 weeks and then twice a week for 4 weeks or until resolution.   EPINEPHrine 0.3 mg/0.3 mL IJ SOAJ injection Inject 0.3 mg into the muscle as needed for anaphylaxis. Inject into the muscle.   Fezolinetant (VEOZAH) 45 MG TABS Take 1 tablet (45 mg total) by mouth daily.   losartan-hydrochlorothiazide (HYZAAR) 100-25 MG tablet Take 1 tablet by mouth daily.   meloxicam (MOBIC) 7.5 MG tablet Take 1 tablet (7.5 mg total) by mouth daily.   rosuvastatin (CRESTOR) 10 MG tablet Take 1 tablet (10 mg total) by mouth daily.   spironolactone (ALDACTONE) 50 MG tablet Take 1 tablet (50 mg total) by mouth daily.   tiZANidine (ZANAFLEX) 4 MG tablet TAKE 1 TABLET(4 MG) BY MOUTH EVERY 6 HOURS FOR UP TO  60 DOSES AS NEEDED FOR MUSCLE SPASMS   Vibegron (GEMTESA) 75 MG TABS Take 1 tablet (75 mg total) by mouth daily.    Objective: BP 103/70   Pulse 74   Ht 5\' 1"  (1.549 m)   Wt 173 lb 12.8 oz (78.8 kg)   BMI 32.84 kg/m   Physical Exam:  General: Alert and oriented. and No acute distress. Gait: Ambulates with the assistance of a cane.  Evaluation left knee demonstrates no deformity.  No swelling.  She has good range of motion.  Negative Lachman.  No increased laxity varus or valgus stress.  A catching sensation is palpable with range of motion testing.  Distally, there is no pain.  Mild tenderness to palpation of the lateral joint line.  No pain with hyperflexion.    IMAGING: I personally ordered and reviewed the following images  X-rays left knee were obtained in clinic today.  No acute injuries are noted.  Neutral overall alignment.  Mild loss of joint space.  Small osteophytes are appreciated.  No bony lesions.  Impression: Negative left knee XR     New Medications:  No orders of the defined types were placed in this encounter.     Oliver Barre, MD  06/17/2023 11:04 AM

## 2023-06-17 NOTE — Patient Instructions (Signed)

## 2023-06-19 NOTE — Telephone Encounter (Signed)
Not a patient at this office

## 2023-06-19 NOTE — Telephone Encounter (Signed)
Copied from CRM [id number]. Topic: Clinical - Lab/Test Results >> Jun 19, 2023  9:47 AM Joanette Gula wrote: Reason for CRM: PT is calling to discuss her recent labs.

## 2023-07-01 ENCOUNTER — Other Ambulatory Visit: Payer: Self-pay | Admitting: Internal Medicine

## 2023-07-01 DIAGNOSIS — M51379 Other intervertebral disc degeneration, lumbosacral region without mention of lumbar back pain or lower extremity pain: Secondary | ICD-10-CM

## 2023-07-01 DIAGNOSIS — G8929 Other chronic pain: Secondary | ICD-10-CM

## 2023-07-07 ENCOUNTER — Encounter: Payer: Self-pay | Admitting: Internal Medicine

## 2023-07-08 ENCOUNTER — Encounter (HOSPITAL_COMMUNITY): Payer: Self-pay | Admitting: *Deleted

## 2023-07-08 ENCOUNTER — Emergency Department (HOSPITAL_COMMUNITY)
Admission: EM | Admit: 2023-07-08 | Discharge: 2023-07-08 | Disposition: A | Discharge status: Home / Self Care | Payer: 59 | Attending: Emergency Medicine | Admitting: Emergency Medicine

## 2023-07-08 ENCOUNTER — Other Ambulatory Visit: Payer: Self-pay

## 2023-07-08 ENCOUNTER — Emergency Department (HOSPITAL_COMMUNITY): Payer: 59

## 2023-07-08 ENCOUNTER — Ambulatory Visit: Payer: Self-pay | Admitting: Internal Medicine

## 2023-07-08 DIAGNOSIS — J449 Chronic obstructive pulmonary disease, unspecified: Secondary | ICD-10-CM | POA: Insufficient documentation

## 2023-07-08 DIAGNOSIS — R519 Headache, unspecified: Secondary | ICD-10-CM | POA: Diagnosis present

## 2023-07-08 DIAGNOSIS — Z87891 Personal history of nicotine dependence: Secondary | ICD-10-CM | POA: Insufficient documentation

## 2023-07-08 DIAGNOSIS — Z8541 Personal history of malignant neoplasm of cervix uteri: Secondary | ICD-10-CM | POA: Insufficient documentation

## 2023-07-08 DIAGNOSIS — I10 Essential (primary) hypertension: Secondary | ICD-10-CM | POA: Diagnosis not present

## 2023-07-08 DIAGNOSIS — Z79899 Other long term (current) drug therapy: Secondary | ICD-10-CM | POA: Insufficient documentation

## 2023-07-08 DIAGNOSIS — J45909 Unspecified asthma, uncomplicated: Secondary | ICD-10-CM | POA: Diagnosis not present

## 2023-07-08 MED ORDER — KETOROLAC TROMETHAMINE 15 MG/ML IJ SOLN
15.0000 mg | Freq: Once | INTRAMUSCULAR | Status: AC
  Administered 2023-07-08: 15 mg via INTRAVENOUS
  Filled 2023-07-08: qty 1

## 2023-07-08 MED ORDER — METOCLOPRAMIDE HCL 5 MG/ML IJ SOLN
10.0000 mg | Freq: Once | INTRAMUSCULAR | Status: AC
  Administered 2023-07-08: 10 mg via INTRAVENOUS
  Filled 2023-07-08: qty 2

## 2023-07-08 MED ORDER — DIPHENHYDRAMINE HCL 50 MG/ML IJ SOLN
25.0000 mg | Freq: Once | INTRAMUSCULAR | Status: AC
  Administered 2023-07-08: 25 mg via INTRAVENOUS
  Filled 2023-07-08: qty 1

## 2023-07-08 MED ORDER — LIDOCAINE 5 % EX PTCH
1.0000 | MEDICATED_PATCH | CUTANEOUS | Status: DC
  Administered 2023-07-08: 1 via TRANSDERMAL
  Filled 2023-07-08: qty 1

## 2023-07-08 NOTE — Telephone Encounter (Signed)
Call center agent informed this nurse that pt did get transportation to ED as advised as of 11:55 am today.

## 2023-07-08 NOTE — Telephone Encounter (Signed)
Needs next available in office appt for acute migraine. Please schedule

## 2023-07-08 NOTE — ED Notes (Addendum)
Pt sitting in bed with no apparent signs of discomfort. Pt stated I am feeling a lot better. These headaches come and go. Denies N/V, headache, and dizziness at this time

## 2023-07-08 NOTE — Discharge Instructions (Signed)
As discussed, CT of your head did not show any obvious abnormality.  It is reassuring that your headache improved so significantly with medicines while in the emergency department.  Recommend follow-up with PCP/neurology for reevaluation.  Please do not hesitate to return if the worrisome signs and symptoms we discussed become apparent.

## 2023-07-08 NOTE — ED Triage Notes (Signed)
Pt with HA x 9 days.  Denies LOC or emesis.  Pain worse with turning her head. Lightheadedness when pain is at it's worse. Right eye twitches, pt seen eye doctor and normal visit, will need new glasses.

## 2023-07-08 NOTE — ED Provider Notes (Signed)
[place] EMERGENCY DEPARTMENT AT [facility] Provider Note   CSN: [id number] Arrival date & time: 07/08/23  1221     History  Chief Complaint  Patient presents with   Headache    [name] is a 57 y.o. female.   Headache   57 year old female presents emergency department complains of headache.  Reports headache for the past 9 days been constant since onset.  Reports history of similar migraines 30+ years ago but has not had headaches similar since then.  Reports sensitivity to light/sound.  Denies any visual streams, gait abnormality, slurred speech, facial droop, weakness/sensory deficits in upper extremities, fever.  Has tried Tylenol without significant improvement of symptoms.  Describes distribution of headache from forehead down to the back of her neck.  Past medical history significant for chronic back pain, bipolar 1 disorder, cervical cancer, COPD, fibromyalgia, hypertension, anxiety, asthma, GAD  Home Medications Prior to Admission medications   Medication Sig Start Date End Date Taking? Authorizing Provider  albuterol (VENTOLIN HFA) 108 (90 Base) MCG/ACT inhaler Inhale 2 puffs into the lungs every 6 (six) hours as needed for wheezing or shortness of breath. 11/04/22   Billie Lade, MD  amLODipine (NORVASC) 10 MG tablet Take 1 tablet (10 mg total) by mouth daily. 06/09/23   Billie Lade, MD  clobetasol ointment (TEMOVATE) 0.05 % Apply to affected area every night for 4 weeks, then every other day for 4 weeks and then twice a week for 4 weeks or until resolution. 06/23/22   Constant, Peggy, MD  EPINEPHrine 0.3 mg/0.3 mL IJ SOAJ injection Inject 0.3 mg into the muscle as needed for anaphylaxis. Inject into the muscle. 04/09/22   Billie Lade, MD  Fezolinetant (VEOZAH) 45 MG TABS Take 1 tablet (45 mg total) by mouth daily. 06/09/23   Billie Lade, MD  losartan-hydrochlorothiazide (HYZAAR) 100-25 MG tablet Take 1 tablet by mouth daily. 06/09/23    Billie Lade, MD  meloxicam (MOBIC) 7.5 MG tablet Take 1 tablet (7.5 mg total) by mouth daily. 06/09/23   Billie Lade, MD  rosuvastatin (CRESTOR) 10 MG tablet Take 1 tablet (10 mg total) by mouth daily. 04/11/22   Billie Lade, MD  spironolactone (ALDACTONE) 50 MG tablet Take 1 tablet (50 mg total) by mouth daily. 06/09/23   Billie Lade, MD  tiZANidine (ZANAFLEX) 4 MG tablet TAKE 1 TABLET(4 MG) BY MOUTH EVERY 6 HOURS FOR UP TO 60 DOSES AS NEEDED FOR MUSCLE SPASMS 07/01/23   Billie Lade, MD  Vibegron (GEMTESA) 75 MG TABS Take 1 tablet (75 mg total) by mouth daily. 06/09/23   Billie Lade, MD      Allergies    Barium sulfate, Barium-containing compounds, Bee venom, Haemophilus influenzae, Influenza virus vaccine, Seldane [terfenadine], Sulfa antibiotics, Amitriptyline, Lisinopril, Lithium, Tetracyclines & related, Celebrex [celecoxib], Dexamethasone, Metformin and related, Prednisone, Trazodone and nefazodone, Zinc, Aspirin, Ativan [lorazepam], Erythromycin, and Lipitor [atorvastatin]    Review of Systems   Review of Systems  Neurological:  Positive for headaches.  All other systems reviewed and are negative.   Physical Exam Updated Vital Signs BP 107/61   Pulse 70   Temp 98.3 F (36.8 C) (Oral)   Resp 14   Ht 5' 1.75" (1.568 m)   Wt 77.6 kg   SpO2 96%   BMI 31.53 kg/m  Physical Exam Vitals and nursing note reviewed.  Constitutional:      General: She is not in acute distress.  Appearance: She is well-developed.  HENT:     Head: Normocephalic and atraumatic.  Eyes:     Conjunctiva/sclera: Conjunctivae normal.  Cardiovascular:     Rate and Rhythm: Normal rate and regular rhythm.     Heart sounds: No murmur heard. Pulmonary:     Effort: Pulmonary effort is normal. No respiratory distress.     Breath sounds: Normal breath sounds.  Abdominal:     Palpations: Abdomen is soft.     Tenderness: There is no abdominal tenderness.  Musculoskeletal:         General: No swelling.     Cervical back: Normal range of motion and neck supple. No rigidity.  Skin:    General: Skin is warm and dry.     Capillary Refill: Capillary refill takes less than 2 seconds.  Neurological:     Mental Status: She is alert.     Comments: Alert and oriented to self, place, time and event.   Speech is fluent, clear without dysarthria or dysphasia.   Strength 5/5 in upper/lower extremities   Sensation intact in upper/lower extremities   Normal gait.  Negative Romberg. No pronator drift.  Normal finger-to-nose and feet tapping.  CN I not tested  CN II not tested CN III, IV, VI PERRLA and EOMs intact bilaterally  CN V Intact sensation to sharp and light touch to the face  CN VII facial movements symmetric  CN VIII not tested  CN IX, X no uvula deviation, symmetric rise of soft palate  CN XI 5/5 SCM and trapezius strength bilaterally  CN XII Midline tongue protrusion, symmetric L/R movements     Psychiatric:        Mood and Affect: Mood normal.     ED Results / Procedures / Treatments   Labs (all labs ordered are listed, but only abnormal results are displayed) Labs Reviewed - No data to display  EKG None  Radiology CT Head Wo Contrast  Result Date: 07/08/2023 CLINICAL DATA:  Headache, increasing frequency or severity EXAM: CT HEAD WITHOUT CONTRAST TECHNIQUE: Contiguous axial images were obtained from the base of the skull through the vertex without intravenous contrast. RADIATION DOSE REDUCTION: This exam was performed according to the departmental dose-optimization program which includes automated exposure control, adjustment of the mA and/or kV according to patient size and/or use of iterative reconstruction technique. COMPARISON:  None Available. FINDINGS: Brain: No hemorrhage. No hydrocephalus. No extra-axial fluid collection. No CT evidence of an acute cortical infarct. No mass effect. No mass lesion. Vascular: No hyperdense vessel or unexpected  calcification. Skull: Normal. Negative for fracture or focal lesion. Sinuses/Orbits: No middle ear or mastoid effusion. Paranasal sinuses are clear. Bilateral lens replacement. Orbits are otherwise unremarkable. Other: None. IMPRESSION: No CT etiology for headaches identified Electronically Signed   By: Lorenza Cambridge M.D.   On: 07/08/2023 14:27    Procedures Procedures    Medications Ordered in ED Medications  lidocaine (LIDODERM) 5 % 1 patch (1 patch Transdermal Patch Applied 07/08/23 1324)  metoCLOPramide (REGLAN) injection 10 mg (10 mg Intravenous Given 07/08/23 1326)  diphenhydrAMINE (BENADRYL) injection 25 mg (25 mg Intravenous Given 07/08/23 1326)  ketorolac (TORADOL) 15 MG/ML injection 15 mg (15 mg Intravenous Given 07/08/23 1326)    ED Course/ Medical Decision Making/ A&P                                 Medical Decision Making Amount and/or  Complexity of Data Reviewed Radiology: ordered.  Risk Prescription drug management.   This patient presents to the ED for concern of headache, this involves an extensive number of treatment options, and is a complaint that carries with it a high risk of complications and morbidity.  The differential diagnosis includes migraine/tension/cluster headache, CVA, cerebral venous thrombosis, meningitis/encephalitis, carotid artery/vertebral artery dissection, other   Co morbidities that complicate the patient evaluation  See HPI   Additional history obtained:  Additional history obtained from EMR External records from outside source obtained and reviewed including hospital records   Lab Tests:  N/a   Imaging Studies ordered:  I ordered imaging studies including CT head  I independently visualized and interpreted imaging which showed no acute intracranial abnormality. I agree with the radiologist interpretation   Cardiac Monitoring: / EKG:  The patient was maintained on a cardiac monitor.  I personally viewed and interpreted  the cardiac monitored which showed an underlying rhythm of: sinus rhythm   Consultations Obtained:  N/a   Problem List / ED Course / Critical interventions / Medication management  Headache I ordered medication including Reglan, Toradol, Benadryl   Reevaluation of the patient after these medicines showed that the patient improved I have reviewed the patients home medicines and have made adjustments as needed   Social Determinants of Health:  Former tobacco use, licit drug use   Test / Admission - Considered:  Headache Vitals signs significant for hypertension blood pressure 144/74. Otherwise within normal range and stable throughout visit. Laboratory/imaging studies significant for: See of 56 year old female presents emergency department with complaint of headache for the past 8 to 9 days.  Headache in frontal forehead region with radiation down neck.  Patient with sensitive to light/sound.  Patient with nonfocal neuroexam.  No obvious temporal tenderness bilaterally.  No obvious signs for meningismus.  CT imaging was obtained given duration of patient's headache which was negative for any acute intracranial abnormality.  Patient treated with migraine cocktail and noted significant improvement reaching near resolution of symptoms.  Suspect patient's symptoms are likely secondary to tension headache.  Will recommend follow-up with PCP/neurology in the outpatient setting for reevaluation.  Treatment plan discussed length with patient and she acknowledged understanding was agreeable to said plan.  Patient overall well-appearing, afebrile in no acute distress. Worrisome signs and symptoms were discussed with the patient, and the patient acknowledged understanding to return to the ED if noticed. Patient was stable upon discharge.          Final Clinical Impression(s) / ED Diagnoses Final diagnoses:  Bad headache    Rx / DC Orders ED Discharge Orders     None          Peter Garter, [place] 07/08/23 1515    Vanetta Mulders, MD 07/09/23 1112
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
## 2023-07-13 ENCOUNTER — Other Ambulatory Visit: Payer: Self-pay | Admitting: Internal Medicine

## 2023-07-13 DIAGNOSIS — E782 Mixed hyperlipidemia: Secondary | ICD-10-CM

## 2023-07-13 MED ORDER — ROSUVASTATIN CALCIUM 10 MG PO TABS: 10.0000 mg | ORAL_TABLET | Freq: Every day | ORAL | 3 refills | Status: DC

## 2023-08-04 ENCOUNTER — Other Ambulatory Visit: Payer: Self-pay | Admitting: Internal Medicine

## 2023-08-04 DIAGNOSIS — G8929 Other chronic pain: Secondary | ICD-10-CM

## 2023-08-04 DIAGNOSIS — M51379 Other intervertebral disc degeneration, lumbosacral region without mention of lumbar back pain or lower extremity pain: Secondary | ICD-10-CM

## 2023-08-19 ENCOUNTER — Ambulatory Visit (INDEPENDENT_AMBULATORY_CARE_PROVIDER_SITE_OTHER): Payer: 59

## 2023-08-19 VITALS — Ht 61.0 in | Wt 174.0 lb

## 2023-08-19 DIAGNOSIS — Z2821 Immunization not carried out because of patient refusal: Secondary | ICD-10-CM

## 2023-08-19 DIAGNOSIS — Z87891 Personal history of nicotine dependence: Secondary | ICD-10-CM

## 2023-08-19 DIAGNOSIS — Z0000 Encounter for general adult medical examination without abnormal findings: Secondary | ICD-10-CM | POA: Diagnosis not present

## 2023-08-19 DIAGNOSIS — Z1231 Encounter for screening mammogram for malignant neoplasm of breast: Secondary | ICD-10-CM
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
## 2023-08-19 NOTE — Patient Instructions (Signed)
[name] , Thank you for taking time to come for your Medicare Wellness Visit. I appreciate your ongoing commitment to your health goals. Please review the following plan we discussed and let me know if I can assist you in the future.   Referrals/Orders/Follow-Ups/Clinician Recommendations:  Next Medicare Annual Wellness Visit:  August 23, 2024 at 1:10 pm virtual visit  You have an order for:  []   2D Mammogram  [x]   3D Mammogram  []   Bone Density   [x]   Lung Cancer Screening  Please call for appointment:   [facility] at [facility] [address] [place], [place] [address] [phone]  Make sure to wear two-piece clothing.  No lotions powders or deodorants the day of the appointment Make sure to bring picture ID and insurance card.  Bring list of medications you are currently taking including any supplements.   Schedule your [place] screening mammogram through MyChart!   Log into your MyChart account.  Go to 'Visit' (or 'Appointments' if on mobile App) --> Schedule an Appointment  Under 'Select a Reason for Visit' choose the Mammogram Screening option.  Complete the pre-visit questions and select the time and place that best fits your schedule.    This is a list of the screening recommended for you and due dates:  Health Maintenance  Topic Date Due   Pneumococcal Vaccination (2 of 2 - PCV) 11/05/2016   Screening for Lung Cancer  11/29/2021   Mammogram  04/29/2023   Flu Shot  10/26/2023*   Medicare Annual Wellness Visit  08/18/2024   Cologuard (Stool DNA test)  04/24/2025   Pap with HPV screening  03/20/2027   DTaP/Tdap/Td vaccine (4 - Td or Tdap) 09/18/2031   Hepatitis C Screening  Completed   HIV Screening  Completed   Zoster (Shingles) Vaccine  Completed   HPV Vaccine  Aged Out   Colon Cancer Screening  Discontinued   COVID-19 Vaccine  Discontinued  *Topic was postponed. The date shown is not the original due date.    Advanced  directives: (Provided) Advance directive discussed with you today. I have provided a copy for you to complete at home and have notarized. Once this is complete, please bring a copy in to our office so we can scan it into your chart.   Next Medicare Annual Wellness Visit scheduled for next year: yes  Preventive Care 28-62 Years Old, Female Preventive care refers to lifestyle choices and visits with your health care provider that can promote health and wellness. Preventive care visits are also called wellness exams. What can I expect for my preventive care visit? Counseling Your health care provider may ask you questions about your: Medical history, including: Past medical problems. Family medical history. Pregnancy history. Current health, including: Menstrual cycle. Method of birth control. Emotional well-being. Home life and relationship well-being. Sexual activity and sexual health. Lifestyle, including: Alcohol, nicotine or tobacco, and drug use. Access to firearms. Diet, exercise, and sleep habits. Work and work Astronomer. Sunscreen use. Safety issues such as seatbelt and bike helmet use. Physical exam Your health care provider will check your: Height and weight. These may be used to calculate your BMI (body mass index). BMI is a measurement that tells if you are at a healthy weight. Waist circumference. This measures the distance around your waistline. This measurement also tells if you are at a healthy weight and may help predict your risk of certain diseases, such as type 2 diabetes and high blood  pressure. Heart rate and blood pressure. Body temperature. Skin for abnormal spots. What immunizations do I need?  Vaccines are usually given at various ages, according to a schedule. Your health care provider will recommend vaccines for you based on your age, medical history, and lifestyle or other factors, such as travel or where you work. What tests do I  need? Screening Your health care provider may recommend screening tests for certain conditions. This may include: Lipid and cholesterol levels. Diabetes screening. This is done by checking your blood sugar (glucose) after you have not eaten for a while (fasting). Pelvic exam and Pap test. Hepatitis B test. Hepatitis C test. HIV (human immunodeficiency virus) test. STI (sexually transmitted infection) testing, if you are at risk. Lung cancer screening. Colorectal cancer screening. Mammogram. Talk with your health care provider about when you should start having regular mammograms. This may depend on whether you have a family history of breast cancer. BRCA-related cancer screening. This may be done if you have a family history of breast, ovarian, tubal, or peritoneal cancers. Bone density scan. This is done to screen for osteoporosis. Talk with your health care provider about your test results, treatment options, and if necessary, the need for more tests. Follow these instructions at home: Eating and drinking  Eat a diet that includes fresh fruits and vegetables, whole grains, lean protein, and low-fat dairy products. Take vitamin and mineral supplements as recommended by your health care provider. Do not drink alcohol if: Your health care provider tells you not to drink. You are pregnant, may be pregnant, or are planning to become pregnant. If you drink alcohol: Limit how much you have to 0-1 drink a day. Know how much alcohol is in your drink. In the [place] one drink equals one 12 oz bottle of beer (355 mL), one 5 oz glass of wine (148 mL), or one 1 oz glass of hard liquor (44 mL). Lifestyle Brush your teeth every morning and night with fluoride toothpaste. Floss one time each day. Exercise for at least 30 minutes 5 or more days each week. Do not use any products that contain nicotine or tobacco. These products include cigarettes, chewing tobacco, and vaping devices, such as  e-cigarettes. If you need help quitting, ask your health care provider. Do not use drugs. If you are sexually active, practice safe sex. Use a condom or other form of protection to prevent STIs. If you do not wish to become pregnant, use a form of birth control. If you plan to become pregnant, see your health care provider for a prepregnancy visit. Take aspirin only as told by your health care provider. Make sure that you understand how much to take and what form to take. Work with your health care provider to find out whether it is safe and beneficial for you to take aspirin daily. Find healthy ways to manage stress, such as: Meditation, yoga, or listening to music. Journaling. Talking to a trusted person. Spending time with friends and family. Minimize exposure to UV radiation to reduce your risk of skin cancer. Safety Always wear your seat belt while driving or riding in a vehicle. Do not drive: If you have been drinking alcohol. Do not ride with someone who has been drinking. When you are tired or distracted. While texting. If you have been using any mind-altering substances or drugs. Wear a helmet and other protective equipment during sports activities. If you have firearms in your house, make sure you follow all gun safety procedures. Seek  help if you have been physically or sexually abused. What's next? Visit your health care provider once a year for an annual wellness visit. Ask your health care provider how often you should have your eyes and teeth checked. Stay up to date on all vaccines. This information is not intended to replace advice given to you by your health care provider. Make sure you discuss any questions you have with your health care provider. Document Revised: 01/09/2021 Document Reviewed: 01/09/2021 Elsevier Patient Education  2024 [employer].  Understanding Your Risk for Falls Millions of people have serious injuries from falls each year. It is important to  understand your risk of falling. Talk with your health care provider about your risk and what you can do to lower it. If you do have a serious fall, make sure to tell your provider. Falling once raises your risk of falling again. How can falls affect me? Serious injuries from falls are common. These include: Broken bones, such as hip fractures. Head injuries, such as traumatic brain injuries (TBI) or concussions. A fear of falling can cause you to avoid activities and stay at home. This can make your muscles weaker and raise your risk for a fall. What can increase my risk? There are a number of risk factors that increase your risk for falling. The more risk factors you have, the higher your risk of falling. Serious injuries from a fall happen most often to people who are older than 59 years old. Teenagers and young adults ages 31-29 are also at higher risk. Common risk factors include: Weakness in the lower body. Being generally weak or confused due to long-term (chronic) illness. Dizziness or balance problems. Poor vision. Medicines that cause dizziness or drowsiness. These may include: Medicines for your blood pressure, heart, anxiety, insomnia, or swelling (edema). Pain medicines. Muscle relaxants. Other risk factors include: Drinking alcohol. Having had a fall in the past. Having foot pain or wearing improper footwear. Working at a dangerous job. Having any of the following in your home: Tripping hazards, such as floor clutter or loose rugs. Poor lighting. Pets. Having dementia or memory loss. What actions can I take to lower my risk of falling?     Physical activity Stay physically fit. Do strength and balance exercises. Consider taking a regular class to build strength and balance. Yoga and tai chi are good options. Vision Have your eyes checked every year and your prescription for glasses or contacts updated as needed. Shoes and walking aids Wear non-skid shoes. Wear  shoes that have rubber soles and low heels. Do not wear high heels. Do not walk around the house in socks or slippers. Use a cane or walker as told by your provider. Home safety Attach secure railings on both sides of your stairs. Install grab bars for your bathtub, shower, and toilet. Use a non-skid mat in your bathtub or shower. Attach bath mats securely with double-sided, non-slip rug tape. Use good lighting in all rooms. Keep a flashlight near your bed. Make sure there is a clear path from your bed to the bathroom. Use night-lights. Do not use throw rugs. Make sure all carpeting is taped or tacked down securely. Remove all clutter from walkways and stairways, including extension cords. Repair uneven or broken steps and floors. Avoid walking on icy or slippery surfaces. Walk on the grass instead of on icy or slick sidewalks. Use ice melter to get rid of ice on walkways in the winter. Use a cordless phone. Questions to  ask your health care provider Can you help me check my risk for a fall? Do any of my medicines make me more likely to fall? Should I take a vitamin D supplement? What exercises can I do to improve my strength and balance? Should I make an appointment to have my vision checked? Do I need a bone density test to check for weak bones (osteoporosis)? Would it help to use a cane or a walker? Where to find more information Centers for Disease Control and Prevention, STEADI: TonerPromos.no Community-Based Fall Prevention Programs: TonerPromos.no [employer] on Aging: BaseRingTones.pl Contact a health care provider if: You fall at home. You are afraid of falling at home. You feel weak, drowsy, or dizzy. This information is not intended to replace advice given to you by your health care provider. Make sure you discuss any questions you have with your health care provider. Document Revised: 03/17/2022 Document Reviewed: 03/17/2022 Elsevier Patient Education  2024 [employer].

## 2023-09-03 ENCOUNTER — Other Ambulatory Visit: Payer: Self-pay | Admitting: Internal Medicine

## 2023-09-03 DIAGNOSIS — G8929 Other chronic pain: Secondary | ICD-10-CM

## 2023-09-03 DIAGNOSIS — M51379 Other intervertebral disc degeneration, lumbosacral region without mention of lumbar back pain or lower extremity pain: Secondary | ICD-10-CM

## 2023-09-10 ENCOUNTER — Ambulatory Visit (INDEPENDENT_AMBULATORY_CARE_PROVIDER_SITE_OTHER): Payer: 59 | Admitting: Internal Medicine

## 2023-09-10 ENCOUNTER — Encounter: Payer: Self-pay | Admitting: Internal Medicine

## 2023-09-10 VITALS — BP 132/72 | HR 81 | Ht 61.0 in | Wt 181.8 lb

## 2023-09-10 DIAGNOSIS — N3281 Overactive bladder: Secondary | ICD-10-CM

## 2023-09-10 DIAGNOSIS — I10 Essential (primary) hypertension: Secondary | ICD-10-CM

## 2023-09-10 DIAGNOSIS — N951 Menopausal and female climacteric states: Secondary | ICD-10-CM

## 2023-09-10 DIAGNOSIS — G8929 Other chronic pain: Secondary | ICD-10-CM | POA: Diagnosis not present

## 2023-09-10 DIAGNOSIS — J449 Chronic obstructive pulmonary disease, unspecified: Secondary | ICD-10-CM | POA: Diagnosis not present

## 2023-09-10 DIAGNOSIS — M5441 Lumbago with sciatica, right side: Secondary | ICD-10-CM

## 2023-09-10 DIAGNOSIS — E782 Mixed hyperlipidemia: Secondary | ICD-10-CM | POA: Diagnosis not present

## 2023-09-10 DIAGNOSIS — Z720 Tobacco use: Secondary | ICD-10-CM | POA: Diagnosis not present

## 2023-09-10 DIAGNOSIS — N3941 Urge incontinence: Secondary | ICD-10-CM | POA: Diagnosis not present

## 2023-09-10 MED ORDER — TIZANIDINE HCL 4 MG PO TABS
4.0000 mg | ORAL_TABLET | Freq: Four times a day (QID) | ORAL | Status: DC | PRN
Start: 2023-09-10 — End: 2023-09-30

## 2023-09-10 MED ORDER — OXYBUTYNIN CHLORIDE ER 10 MG PO TB24: 10.0000 mg | ORAL_TABLET | Freq: Every day | ORAL | 2 refills | Status: DC

## 2023-09-10 MED ORDER — VEOZAH 45 MG PO TABS: 1.0000 | ORAL_TABLET | Freq: Every day | ORAL | 2 refills | Status: DC

## 2023-09-10 NOTE — Assessment & Plan Note (Signed)
Adequately controlled on current antihypertensive regimen.  No medication changes are indicated today.

## 2023-09-10 NOTE — Assessment & Plan Note (Signed)
History of former tobacco use, quitting 4-5 years ago.  She previously smoked since age 58 and reports smoking 1-1.5 packs/day of cigarettes.  Previously enrolled in lung cancer screening, last completed in May 2022.  Will message lung cancer screening program today.

## 2023-09-10 NOTE — Patient Instructions (Signed)
It was a pleasure to see you today.  Thank you for giving [place] the opportunity to be involved in your care.  Below is a brief recap of your visit and next steps.  We will plan to see you again in 3 months.  Summary Try oxybutynin for incontinence.  Will send new prescription for Veozah Follow up in 3 months

## 2023-09-10 NOTE — Progress Notes (Signed)
Established Patient Office Visit  Subjective   Patient ID: [name], female    DOB: 07-06-1966  Age: 58 y.o. MRN: [id number]  Chief Complaint  Patient presents with   Care Management    Three month follow up    [name] returns to care today for routine follow-up.  She was last evaluated by me in November 2024.  [name] was started in the setting of urge incontinence and Veozah added in for treatment of vasomotor symptoms.  She was also referred to orthopedic surgery in the setting of acute left knee pain.  In the interim, she was evaluated by orthopedic surgery on 11/20 for left knee pain and received an intra-articular corticosteroid injection.  ED presentation in December endorsing a persistent headache.  Workup reassuring.  Treated with migraine cocktail endorse symptom improvement.  There have otherwise been no acute interval events. Today [name] reports feeling fairly well.  She endorses chronic lumbar back pain but is asymptomatic and has no acute concerns discussed.  She states that [name] was not effective and she never received a prescription for Veozah.  Past Medical History:  Diagnosis Date   Acute pansinusitis 05/03/2019   Allergy    Anxiety    Arthritis    "pretty much all over; mainly neck and back" (12/26/2015)   Asthma    Atypical squamous cell changes of undetermined significance (ASCUS) on vaginal cytology 04/11/2022   Bipolar 1 disorder (HCC)    Bipolar disorder (HCC)    Bipolar I disorder, most recent episode depressed (HCC) 07/04/2014   Cervical cancer (HCC)    "stage I"   Chronic back pain    Chronic bronchitis (HCC)    Colon cancer screening 02/15/2018   COPD (chronic obstructive pulmonary disease) (HCC)    Degenerative disc disease    Depression    Dysrhythmia    Fibromyalgia    GERD (gastroesophageal reflux disease)    Hepatic cyst    High cholesterol    History of hiatal hernia    Hypertension 08/04/2012   IBS (irritable bowel  syndrome) Diagnosed November 2012   Incisional hernia with gangrene and obstruction 12/26/2015   Melanoma of back (HCC)    Ovarian cancer (HCC)    "stage II"   Plantar fasciitis, bilateral    Preventative health care 04/11/2022   Sleep apnea    cpap coming  "soon" (12/26/2015)   Past Surgical History:  Procedure Laterality Date   ABDOMINAL HYSTERECTOMY  1997   APPENDECTOMY     CARDIAC CATHETERIZATION  2015   CARPAL TUNNEL RELEASE Right    CESAREAN SECTION  1987   CHOLECYSTECTOMY OPEN  1998   COLONOSCOPY  November 2012   EYE SURGERY     CATARACTS REMOVED 09/09/17 and 09/16/17   HERNIA REPAIR     INCISIONAL HERNIA REPAIR N/A 12/26/2015   Procedure: LAPAROSCOPIC REPAIR INCISIONAL HERNIA WITH MESH;  Surgeon: Claud Kelp, MD;  Location: [facility] OR;  Service: General;  Laterality: N/A;   INSERTION OF MESH N/A 12/26/2015   Procedure: INSERTION OF MESH;  Surgeon: Claud Kelp, MD;  Location: [facility] OR;  Service: General;  Laterality: N/A;   LAPAROSCOPIC ABDOMINAL EXPLORATION  1997   LAPAROSCOPIC INCISIONAL / UMBILICAL / VENTRAL HERNIA REPAIR  12/26/2015   repair of incarcerated incisional hernia with mesh   LIVER CYST REMOVAL  2014   MELANOMA EXCISION  January 2013   removed from back   SHOULDER SURGERY Left 2010   Humeral Head Microfracture  TUBAL LIGATION     UPPER GASTROINTESTINAL ENDOSCOPY  November 2012   Social History   Tobacco Use   Smoking status: Former    Types: E-cigarettes   Smokeless tobacco: Never   Tobacco comments:    Smokes hemp every night to help with her pain  Vaping Use   Vaping status: Every Day   Start date: 04/18/2019   Substances: Nicotine-salt  Substance Use Topics   Alcohol use: No    Comment: 12/26/2015 'quit in ~ 1997"   Drug use: Yes    Types: Other-see comments, Marijuana    Comment: 12/26/2015 "quit in ~  2010".   smokes CBD with 0.3% THC nightly   Family History  Adopted: Yes  Problem Relation Age of Onset   Bipolar disorder Mother         never diagnosed but patient suspects mother had bipolar disorder.Marland KitchenMarland KitchenMarland KitchenMarland Kitchenpt was adopeted, only knew birth mother for a short period of time.   Anxiety disorder Mother    Cirrhosis Mother    Diabetes Mother    Turner syndrome Daughter    Cancer Daughter    Anxiety disorder Daughter    Bipolar disorder Daughter    Interstitial cystitis Daughter    ADD / ADHD Neg Hx    Alcohol abuse Neg Hx    Drug abuse Neg Hx    Dementia Neg Hx    Depression Neg Hx    OCD Neg Hx    Paranoid behavior Neg Hx    Schizophrenia Neg Hx    Seizures Neg Hx    Sexual abuse Neg Hx    Physical abuse Neg Hx    Colon cancer Neg Hx    Allergies  Allergen Reactions   Barium Sulfate Nausea And Vomiting    Stomach cramps, extreme diarrhea, and vomiting   Barium-Containing Compounds Other (See Comments)    Stomach cramps, extreme diarrhea, and vomiting   Bee Venom Anaphylaxis   Haemophilus Influenzae Swelling    Trouble swallowing   Influenza Virus Vaccine Swelling    Trouble swallowing Trouble swallowing    Seldane [Terfenadine] Nausea And Vomiting and Other (See Comments)    CAUSES TOP LAYER OF SKIN TO PEEL   Sulfa Antibiotics Anaphylaxis   Amitriptyline     Other reaction(s): Other (See Comments) Mood instability   Lisinopril Cough   Lithium Other (See Comments)    Agitation and hostility   Tetracyclines & Related Hives and Nausea And Vomiting   Celebrex [Celecoxib] Nausea And Vomiting   Dexamethasone     Elevated blood sugar,nausea,diarrhea, muscle weakness   Metformin And Related Nausea And Vomiting   Prednisone     Other reaction(s): Generalized Edema (intolerance)   Trazodone And Nefazodone     Sick   Zinc Nausea And Vomiting   Aspirin Rash and Other (See Comments)    Upset stomach   Ativan [Lorazepam] Other (See Comments)    Irritability   Erythromycin Nausea And Vomiting, Rash and Other (See Comments)    Cramps   Lipitor [Atorvastatin] Nausea And Vomiting   Review of Systems   Constitutional:  Negative for chills and fever.  HENT:  Negative for sore throat.   Respiratory:  Negative for cough and shortness of breath.   Cardiovascular:  Negative for chest pain, palpitations and leg swelling.  Gastrointestinal:  Negative for abdominal pain, blood in stool, constipation, diarrhea, nausea and vomiting.  Genitourinary:  Negative for dysuria and hematuria.  Musculoskeletal:  Positive for back pain (chronic lumbar pain).  Negative for myalgias.  Skin:  Negative for itching and rash.  Neurological:  Negative for dizziness and headaches.  Psychiatric/Behavioral:  Negative for depression and suicidal ideas.      Objective:     BP 132/72   Pulse 81   Ht 5\' 1"  (1.549 m)   Wt 181 lb 12.8 oz (82.5 kg)   SpO2 96%   BMI 34.35 kg/m  BP Readings from Last 3 Encounters:  09/10/23 132/72  07/08/23 107/61  06/17/23 103/70   Physical Exam Vitals reviewed.  Constitutional:      General: She is not in acute distress.    Appearance: Normal appearance. She is obese. She is not toxic-appearing.  HENT:     Head: Normocephalic and atraumatic.     Right Ear: External ear normal.     Left Ear: External ear normal.     Nose: Nose normal. No congestion or rhinorrhea.     Mouth/Throat:     Mouth: Mucous membranes are moist.     Pharynx: Oropharynx is clear. No oropharyngeal exudate or posterior oropharyngeal erythema.  Eyes:     General: No scleral icterus.    Extraocular Movements: Extraocular movements intact.     Conjunctiva/sclera: Conjunctivae normal.     Pupils: Pupils are equal, round, and reactive to light.  Cardiovascular:     Rate and Rhythm: Normal rate and regular rhythm.     Pulses: Normal pulses.     Heart sounds: Normal heart sounds. No murmur heard.    No friction rub. No gallop.  Pulmonary:     Effort: Pulmonary effort is normal.     Breath sounds: Normal breath sounds. No wheezing, rhonchi or rales.  Abdominal:     General: Abdomen is flat. Bowel  sounds are normal. There is no distension.     Palpations: Abdomen is soft.     Tenderness: There is no abdominal tenderness.  Musculoskeletal:        General: No swelling. Normal range of motion.     Cervical back: Normal range of motion.     Right lower leg: No edema.     Left lower leg: No edema.  Lymphadenopathy:     Cervical: No cervical adenopathy.  Skin:    General: Skin is warm and dry.     Capillary Refill: Capillary refill takes less than 2 seconds.     Coloration: Skin is not jaundiced.  Neurological:     General: No focal deficit present.     Mental Status: She is alert and oriented to person, place, and time.     Gait: Gait abnormal (ambulates with a cane).  Psychiatric:        Mood and Affect: Mood normal.        Behavior: Behavior normal.   Last CBC Lab Results  Component Value Date   WBC 8.5 06/09/2023   HGB 14.3 06/09/2023   HCT 44.4 06/09/2023   MCV 87 06/09/2023   MCH 27.9 06/09/2023   RDW 13.3 06/09/2023   PLT 341 06/09/2023   Last metabolic panel Lab Results  Component Value Date   GLUCOSE 99 06/09/2023   NA 141 06/09/2023   K 4.1 06/09/2023   CL 102 06/09/2023   CO2 21 06/09/2023   BUN 12 06/09/2023   CREATININE 0.74 06/09/2023   EGFR 94 06/09/2023   CALCIUM 9.8 06/09/2023   PROT 7.2 06/09/2023   ALBUMIN 4.3 06/09/2023   LABGLOB 2.9 06/09/2023   AGRATIO 1.6 11/17/2022   BILITOT  0.5 06/09/2023   ALKPHOS 104 06/09/2023   AST 19 06/09/2023   ALT 19 06/09/2023   ANIONGAP 8 12/08/2017   Last lipids Lab Results  Component Value Date   CHOL 199 06/09/2023   HDL 46 06/09/2023   LDLCALC 134 (H) 06/09/2023   TRIG 107 06/09/2023   CHOLHDL 4.3 06/09/2023   Last hemoglobin A1c Lab Results  Component Value Date   HGBA1C 5.8 (H) 06/09/2023   Last thyroid functions Lab Results  Component Value Date   TSH 1.710 06/09/2023   T3TOTAL 138.6 02/08/2014   Last vitamin D Lab Results  Component Value Date   VD25OH 25.1 (L) 06/09/2023    Last vitamin B12 and Folate Lab Results  Component Value Date   VITAMINB12 431 06/09/2023   FOLATE 3.3 06/09/2023   The 10-year ASCVD risk score (Arnett DK, et al., 2019) is: 4.4%    Assessment & Plan:   Problem List Items Addressed This Visit       Essential hypertension   Adequately controlled on current antihypertensive regimen.  No medication changes are indicated today.      Chronic obstructive pulmonary disease (HCC)   Asymptomatic currently.  Pulmonary exam is unremarkable.  She continues to use albuterol as needed.      Tobacco use   History of former tobacco use, quitting 4-5 years ago.  She previously smoked since age 45 and reports smoking 1-1.5 packs/day of cigarettes.  Previously enrolled in lung cancer screening, last completed in May 2022.  Will message lung cancer screening program today.      Hyperlipidemia   Lipid panel updated in November.  Total cholesterol 199 and LDL 134.  Her 10-year ASCVD risk is 6.5%.  She is currently prescribed rosuvastatin 10 mg daily.  Repeat lipid panel at follow-up in 3 months.      Urge incontinence - Primary   Symptoms remain poorly controlled.  She [place]  Will prescribe oxybutynin 10 mg nightly      Vasomotor symptoms due to menopause   She continues to endorse hot flashes and profuse sweating overnight.  Veozah was previously prescribed but she never received the prescription.  Will place new order today.      Return in about 3 months (around 12/08/2023).   Billie Lade, MD

## 2023-09-10 NOTE — Assessment & Plan Note (Signed)
Asymptomatic currently.  Pulmonary exam is unremarkable.  She continues to use albuterol as needed.

## 2023-09-10 NOTE — Assessment & Plan Note (Signed)
Symptoms remain poorly controlled.  She has previously tried [place]  Will prescribe oxybutynin 10 mg nightly

## 2023-09-10 NOTE — Assessment & Plan Note (Signed)
She continues to endorse hot flashes and profuse sweating overnight.  Veozah was previously prescribed but she never received the prescription.  Will place new order today.

## 2023-09-10 NOTE — Assessment & Plan Note (Signed)
Lipid panel updated in November.  Total cholesterol 199 and LDL 134.  Her 10-year ASCVD risk is 6.5%.  She is currently prescribed rosuvastatin 10 mg daily.  Repeat lipid panel at follow-up in 3 months.

## 2023-09-11 NOTE — Telephone Encounter (Unsigned)
Copied from CRM [id number]. Topic: Clinical - Request for Lab/Test Order >> Sep 11, 2023  8:57 AM [name] wrote: Reason for CRM: Patient inquired about lung cancer screening as discussed in appointment with Dr Durwin Nora on 09-10-2023. Patient is having the procedure done at [facility] but they say no order is in the system for it. Callback number is [phone]

## 2023-09-16 ENCOUNTER — Ambulatory Visit (HOSPITAL_COMMUNITY): Payer: 59

## 2023-09-28 ENCOUNTER — Ambulatory Visit: Payer: Self-pay | Admitting: Internal Medicine

## 2023-09-28 NOTE — Telephone Encounter (Signed)
 Chief Complaint: Diarrhea Symptoms: watery stools Frequency: 7-10 days Pertinent Negatives: Patient denies fever, abd pain Disposition: [] ED /[] Urgent Care (no appt availability in office) / [x] Appointment(In office/virtual)/ []  [facility]/ [] Home Care/ [] Refused Recommended Disposition /[] [facility]/ []  Follow-up with PCP Additional Notes: Pt reports she has been experiencing watery stools for about 7-10 days. She notes she has skin breakdown and is having to use vaseline due to the excessive diarrhea. Pt has tried Immodium AD OTC with little to no relief. She reports she is drinking plenty of fluids however she has lost 5 lbs since this began as she feels anything she eats "goes right through me". Pt denies fever, abd pain. Pt does report hx of IBS but states this is significantly worse than her usual episodes. OV scheduled tomorrow AM. This RN educated pt on home care, new-worsening symptoms, when to call back/seek emergent care. Pt verbalized understanding and agrees to plan.   Copied from CRM [id number]. Topic: Clinical - Pink Word Triage >> Sep 28, 2023 12:50 PM Tiffany S wrote: Reason for Triage: Patient is having uncontrolled diarrhea and has developed skin break down Reason for Disposition  [1] SEVERE diarrhea (e.g., 7 or more times / day more than normal) AND [2] present > 24 hours (1 day)  Answer Assessment - Initial Assessment Questions 1. DIARRHEA SEVERITY: "How bad is the diarrhea?" "How many more stools have you had in the past 24 hours than normal?"    - NO DIARRHEA (SCALE 0)   - MILD (SCALE 1-3): Few loose or mushy BMs; increase of 1-3 stools over normal daily number of stools; mild increase in ostomy output.   -  MODERATE (SCALE 4-7): Increase of 4-6 stools daily over normal; moderate increase in ostomy output.   -  SEVERE (SCALE 8-10; OR "WORST POSSIBLE"): Increase of 7 or more stools daily over normal; moderate increase in ostomy output; incontinence.      Severe, anytime she eats she has a BM 2. ONSET: "When did the diarrhea begin?"      About 7-10 days 3. BM CONSISTENCY: "How loose or watery is the diarrhea?"      Watery 4. VOMITING: "Are you also vomiting?" If Yes, ask: "How many times in the past 24 hours?"      None 5. ABDOMEN PAIN: "Are you having any abdomen pain?" If Yes, ask: "What does it feel like?" (e.g., crampy, dull, intermittent, constant)      None  7. ORAL INTAKE: If vomiting, "Have you been able to drink liquids?" "How much liquids have you had in the past 24 hours?"     Drinking plenty of fluids 8. HYDRATION: "Any signs of dehydration?" (e.g., dry mouth [not just dry lips], too weak to stand, dizziness, new weight loss) "When did you last urinate?"     Pt reports she's not experiencing sx of dehydration 9. EXPOSURE: "Have you traveled to a foreign country recently?" "Have you been exposed to anyone with diarrhea?" "Could you have eaten any food that was spoiled?"     None 10. ANTIBIOTIC USE: "Are you taking antibiotics now or have you taken antibiotics in the past 2 months?"       None 11. OTHER SYMPTOMS: "Do you have any other symptoms?" (e.g., fever, blood in stool)       Skin breakdown  Protocols used: Diarrhea-A-AH

## 2023-09-29 ENCOUNTER — Ambulatory Visit: Payer: Self-pay | Admitting: Family Medicine

## 2023-09-29 NOTE — Telephone Encounter (Signed)
 Pt feeling better

## 2023-09-30 ENCOUNTER — Ambulatory Visit (HOSPITAL_COMMUNITY): Payer: 59

## 2023-09-30 ENCOUNTER — Other Ambulatory Visit: Payer: Self-pay | Admitting: Internal Medicine

## 2023-09-30 DIAGNOSIS — G8929 Other chronic pain: Secondary | ICD-10-CM

## 2023-10-07 ENCOUNTER — Ambulatory Visit (HOSPITAL_COMMUNITY)
Admission: RE | Admit: 2023-10-07 | Discharge: 2023-10-07 | Disposition: A | Discharge status: Home / Self Care | Source: Ambulatory Visit | Attending: Internal Medicine | Admitting: Internal Medicine

## 2023-10-07 DIAGNOSIS — Z1231 Encounter for screening mammogram for malignant neoplasm of breast: Secondary | ICD-10-CM | POA: Insufficient documentation

## 2023-10-30 ENCOUNTER — Other Ambulatory Visit: Payer: Self-pay | Admitting: Internal Medicine

## 2023-10-30 DIAGNOSIS — G8929 Other chronic pain: Secondary | ICD-10-CM

## 2023-12-05 ENCOUNTER — Emergency Department (HOSPITAL_COMMUNITY)

## 2023-12-05 ENCOUNTER — Other Ambulatory Visit: Payer: Self-pay

## 2023-12-05 ENCOUNTER — Observation Stay (HOSPITAL_COMMUNITY)
Admission: EM | Admit: 2023-12-05 | Discharge: 2023-12-06 | Disposition: A | Discharge status: Home / Self Care | Attending: Cardiology | Admitting: Cardiology

## 2023-12-05 DIAGNOSIS — Z87891 Personal history of nicotine dependence: Secondary | ICD-10-CM | POA: Insufficient documentation

## 2023-12-05 DIAGNOSIS — Z8543 Personal history of malignant neoplasm of ovary: Secondary | ICD-10-CM | POA: Diagnosis not present

## 2023-12-05 DIAGNOSIS — Z8541 Personal history of malignant neoplasm of cervix uteri: Secondary | ICD-10-CM | POA: Insufficient documentation

## 2023-12-05 DIAGNOSIS — Z79899 Other long term (current) drug therapy: Secondary | ICD-10-CM | POA: Diagnosis not present

## 2023-12-05 DIAGNOSIS — R079 Chest pain, unspecified: Secondary | ICD-10-CM | POA: Diagnosis not present

## 2023-12-05 DIAGNOSIS — J449 Chronic obstructive pulmonary disease, unspecified: Secondary | ICD-10-CM | POA: Insufficient documentation

## 2023-12-05 DIAGNOSIS — I2510 Atherosclerotic heart disease of native coronary artery without angina pectoris: Secondary | ICD-10-CM | POA: Diagnosis not present

## 2023-12-05 DIAGNOSIS — R0789 Other chest pain: Secondary | ICD-10-CM | POA: Diagnosis not present

## 2023-12-05 DIAGNOSIS — Q245 Malformation of coronary vessels: Secondary | ICD-10-CM

## 2023-12-05 DIAGNOSIS — I10 Essential (primary) hypertension: Secondary | ICD-10-CM | POA: Insufficient documentation

## 2023-12-05 DIAGNOSIS — K760 Fatty (change of) liver, not elsewhere classified: Secondary | ICD-10-CM | POA: Diagnosis not present

## 2023-12-05 DIAGNOSIS — I959 Hypotension, unspecified: Secondary | ICD-10-CM | POA: Diagnosis not present

## 2023-12-05 DIAGNOSIS — N281 Cyst of kidney, acquired: Secondary | ICD-10-CM | POA: Diagnosis not present

## 2023-12-05 LAB — URINALYSIS, ROUTINE W REFLEX MICROSCOPIC
Bilirubin Urine: NEGATIVE
Glucose, UA: NEGATIVE mg/dL
Hgb urine dipstick: NEGATIVE
Ketones, ur: NEGATIVE mg/dL
Leukocytes,Ua: NEGATIVE
Nitrite: NEGATIVE
Protein, ur: NEGATIVE mg/dL
Specific Gravity, Urine: 1.006 (ref 1.005–1.030)
pH: 7 (ref 5.0–8.0)

## 2023-12-05 LAB — TROPONIN I (HIGH SENSITIVITY)
Troponin I (High Sensitivity): 9 ng/L (ref <18)
Troponin I (High Sensitivity): 9 ng/L (ref <18)

## 2023-12-05 LAB — CBC WITH DIFFERENTIAL/PLATELET
Abs Immature Granulocytes: 0.05 10*3/uL (ref 0.00–0.07)
Basophils Absolute: 0.1 10*3/uL (ref 0.0–0.1)
Basophils Relative: 1 %
Eosinophils Absolute: 0.4 10*3/uL (ref 0.0–0.5)
Eosinophils Relative: 4 %
HCT: 42.8 % (ref 36.0–46.0)
Hemoglobin: 14.4 g/dL (ref 12.0–15.0)
Immature Granulocytes: 1 %
Lymphocytes Relative: 22 %
Lymphs Abs: 2.3 10*3/uL (ref 0.7–4.0)
MCH: 29 pg (ref 26.0–34.0)
MCHC: 33.6 g/dL (ref 30.0–36.0)
MCV: 86.1 fL (ref 80.0–100.0)
Monocytes Absolute: 0.8 10*3/uL (ref 0.1–1.0)
Monocytes Relative: 8 %
Neutro Abs: 6.9 10*3/uL (ref 1.7–7.7)
Neutrophils Relative %: 64 %
Platelets: 357 10*3/uL (ref 150–400)
RBC: 4.97 MIL/uL (ref 3.87–5.11)
RDW: 12.6 % (ref 11.5–15.5)
WBC: 10.5 10*3/uL (ref 4.0–10.5)
nRBC: 0 % (ref 0.0–0.2)

## 2023-12-05 LAB — COMPREHENSIVE METABOLIC PANEL WITH GFR
ALT: 21 U/L (ref 0–44)
AST: 19 U/L (ref 15–41)
Albumin: 3.7 g/dL (ref 3.5–5.0)
Alkaline Phosphatase: 72 U/L (ref 38–126)
Anion gap: 12 (ref 5–15)
BUN: 15 mg/dL (ref 6–20)
CO2: 22 mmol/L (ref 22–32)
Calcium: 9.4 mg/dL (ref 8.9–10.3)
Chloride: 102 mmol/L (ref 98–111)
Creatinine, Ser: 0.89 mg/dL (ref 0.44–1.00)
GFR, Estimated: >60 mL/min (ref >60)
Glucose, Bld: 96 mg/dL (ref 70–99)
Potassium: 3.5 mmol/L (ref 3.5–5.1)
Sodium: 136 mmol/L (ref 135–145)
Total Bilirubin: 0.8 mg/dL (ref 0.0–1.2)
Total Protein: 7.1 g/dL (ref 6.5–8.1)

## 2023-12-05 MED ORDER — HYDROCHLOROTHIAZIDE 25 MG PO TABS
25.0000 mg | ORAL_TABLET | Freq: Every day | ORAL | Status: DC
  Administered 2023-12-06: 25 mg via ORAL
  Filled 2023-12-05: qty 1

## 2023-12-05 MED ORDER — ENOXAPARIN SODIUM 40 MG/0.4ML IJ SOSY
40.0000 mg | PREFILLED_SYRINGE | INTRAMUSCULAR | Status: DC
  Administered 2023-12-05: 40 mg via SUBCUTANEOUS
  Filled 2023-12-05: qty 0.4

## 2023-12-05 MED ORDER — ASPIRIN 81 MG PO TBEC
81.0000 mg | DELAYED_RELEASE_TABLET | Freq: Every day | ORAL | Status: DC
  Administered 2023-12-06: 81 mg via ORAL
  Filled 2023-12-05: qty 1

## 2023-12-05 MED ORDER — AMLODIPINE BESYLATE 10 MG PO TABS
10.0000 mg | ORAL_TABLET | Freq: Every day | ORAL | Status: DC
  Administered 2023-12-06: 10 mg via ORAL
  Filled 2023-12-05: qty 1

## 2023-12-05 MED ORDER — ROSUVASTATIN CALCIUM 5 MG PO TABS
10.0000 mg | ORAL_TABLET | Freq: Every day | ORAL | Status: DC
  Administered 2023-12-06: 10 mg via ORAL
  Filled 2023-12-05: qty 2

## 2023-12-05 MED ORDER — FENTANYL CITRATE PF 50 MCG/ML IJ SOSY
50.0000 ug | PREFILLED_SYRINGE | Freq: Once | INTRAMUSCULAR | Status: AC
  Administered 2023-12-05: 50 ug via INTRAVENOUS
  Filled 2023-12-05: qty 1

## 2023-12-05 MED ORDER — SPIRONOLACTONE 25 MG PO TABS
50.0000 mg | ORAL_TABLET | Freq: Every day | ORAL | Status: DC
  Administered 2023-12-06: 50 mg via ORAL
  Filled 2023-12-05: qty 2

## 2023-12-05 MED ORDER — LOSARTAN POTASSIUM-HCTZ 100-25 MG PO TABS: 1.0000 | ORAL_TABLET | Freq: Every day | ORAL | Status: DC

## 2023-12-05 MED ORDER — IOHEXOL 350 MG/ML SOLN
75.0000 mL | Freq: Once | INTRAVENOUS | Status: AC | PRN
  Administered 2023-12-05: 75 mL via INTRAVENOUS

## 2023-12-05 MED ORDER — NITROGLYCERIN 0.4 MG SL SUBL
0.4000 mg | SUBLINGUAL_TABLET | Freq: Once | SUBLINGUAL | Status: AC
  Administered 2023-12-05: 0.4 mg via SUBLINGUAL
  Filled 2023-12-05: qty 1

## 2023-12-05 MED ORDER — LOSARTAN POTASSIUM 50 MG PO TABS
100.0000 mg | ORAL_TABLET | Freq: Every day | ORAL | Status: DC
  Administered 2023-12-06: 100 mg via ORAL
  Filled 2023-12-05: qty 2

## 2023-12-05 NOTE — ED Triage Notes (Signed)
 Patient brought from home by [facility] for chest pain. The patient has had the chest pain for the past several weeks, but it has become unbearable today. Patient had nausea and vomiting yesterday. Patient has had sharp pain and pressure pain.

## 2023-12-05 NOTE — ED Provider Notes (Signed)
 [place] EMERGENCY DEPARTMENT AT [facility] Provider Note  CSN: [id number] Arrival date & time: 12/05/23 1426  Chief Complaint(s) Chest Pain  HPI [name] is a 58 y.o. female with PMH COPD, bipolar 1, HTN, IBS who presents emerged from for evaluation of chest pain.  She states that over the last 1 month she has had pressure-like chest pain worse with exertion.  She states that over last 24 hours her symptoms have significant worsened and she feels like she has a "tight band around her chest".  Endorses associated positional dyspnea.  Denies associated nausea, vomiting, diaphoresis.  EMS administered 325 of aspirin and 0.4 mg of nitroglycerin  in the field with mild symptomatic improvement but at the time of my evaluation her pain has returned.  No previous cardiac history and a coronary CTA obtained 3 years ago showed clean coronaries.   Past Medical History Past Medical History:  Diagnosis Date   Acute pansinusitis 05/03/2019   Allergy    Anxiety    Arthritis    "pretty much all over; mainly neck and back" (12/26/2015)   Asthma    Atypical squamous cell changes of undetermined significance (ASCUS) on vaginal cytology 04/11/2022   Bipolar 1 disorder (HCC)    Bipolar disorder (HCC)    Bipolar I disorder, most recent episode depressed (HCC) 07/04/2014   Cervical cancer (HCC)    "stage I"   Chronic back pain    Chronic bronchitis (HCC)    Colon cancer screening 02/15/2018   COPD (chronic obstructive pulmonary disease) (HCC)    Degenerative disc disease    Depression    Dysrhythmia    Fibromyalgia    GERD (gastroesophageal reflux disease)    Hepatic cyst    High cholesterol    History of hiatal hernia    Hypertension 08/04/2012   IBS (irritable bowel syndrome) Diagnosed November 2012   Incisional hernia with gangrene and obstruction 12/26/2015   Melanoma of back (HCC)    Ovarian cancer (HCC)    "stage II"   Plantar fasciitis, bilateral    Preventative  health care 04/11/2022   Sleep apnea    cpap coming  "soon" (12/26/2015)   Patient Active Problem List   Diagnosis Date Noted   Vasomotor symptoms due to menopause 06/09/2023   Acute pain of left knee 06/09/2023   Injury of toenail of left foot 03/06/2023   Arthralgia 02/11/2023   Pruritus 11/18/2022   Acute lumbar back pain 10/07/2022   Medial epicondylitis of elbow, right 09/02/2022   Urge incontinence 09/02/2022   Borderline personality disorder (HCC) 05/29/2022   Cannabis use disorder 05/29/2022   Major depressive disorder, recurrent episode, moderate (HCC) 05/29/2022   Right flank pain 05/07/2022   Hyperlipidemia 04/11/2022   BMI 33.0-33.9,adult 04/11/2022   Tenosynovitis of thumb 01/12/2020   Mixed simple and mucopurulent chronic bronchitis (HCC) 12/02/2019   Lichen sclerosus et atrophicus of the vulva 12/01/2019   Chronic pain syndrome 08/29/2019   Lumbar facet arthropathy (L4/5 and L5/S1) 08/29/2019   Non-recurrent acute serous otitis media of left ear 08/04/2019   Chronic pain of both shoulders 06/02/2019   Primary osteoarthritis of left shoulder 06/02/2019   H/O repair of left rotator cuff 06/02/2019   Vitamin D  deficiency 04/20/2019   Lipoma of torso 09/07/2018   Prediabetes 10/01/2017   Coronary artery disease 10/01/2017   Hepatic cyst 08/18/2017   Fibromyalgia 04/16/2017   Sacroiliac joint pain 04/16/2017   Incisional hernia with gangrene and obstruction 12/26/2015  Incisional hernia with obstruction 12/26/2015   OSA (obstructive sleep apnea) 12/13/2015   Irritable bowel syndrome 09/25/2015   Risk for falls 09/25/2015   Hypertensive urgency 03/19/2015   Unsteady gait 03/19/2015   PTSD (post-traumatic stress disorder) 01/31/2014   Generalized anxiety disorder with panic attacks 01/31/2014   Chronic right-sided low back pain with right-sided sciatica 08/18/2013   DDD (degenerative disc disease), cervical 08/18/2013   DDD (degenerative disc disease),  lumbosacral 08/18/2013   Myalgia, multiple sites 08/18/2013   Osteoarthritis 09/14/2012   Asthma 09/14/2012   Chronic obstructive pulmonary disease (HCC) 09/14/2012   Gastro-esophageal reflux disease without esophagitis 09/14/2012   Essential hypertension 09/14/2012   Tobacco use 09/14/2012   Vaping nicotine dependence, non-tobacco product 09/14/2012   Insomnia due to mental disorder 05/28/2012   Home Medication(s) Prior to Admission medications   Medication Sig Start Date End Date Taking? Authorizing Provider  albuterol  (VENTOLIN  HFA) 108 (90 Base) MCG/ACT inhaler Inhale 2 puffs into the lungs every 6 (six) hours as needed for wheezing or shortness of breath. 11/04/22   Tobi Fortes, MD  amLODipine  (NORVASC ) 10 MG tablet Take 1 tablet (10 mg total) by mouth daily. 06/09/23   Dixon, Phillip E, MD  clobetasol  ointment (TEMOVATE ) 0.05 % Apply to affected area every night for 4 weeks, then every other day for 4 weeks and then twice a week for 4 weeks or until resolution. 06/23/22   Constant, Peggy, MD  EPINEPHrine  0.3 mg/0.3 mL IJ SOAJ injection Inject 0.3 mg into the muscle as needed for anaphylaxis. Inject into the muscle. 04/09/22   Tobi Fortes, MD  Fezolinetant  (VEOZAH ) 45 MG TABS Take 1 tablet (45 mg total) by mouth daily. 09/10/23   Tobi Fortes, MD  losartan -hydrochlorothiazide  (HYZAAR) 100-25 MG tablet Take 1 tablet by mouth daily. 06/09/23   Tobi Fortes, MD  oxybutynin  (DITROPAN  XL) 10 MG 24 hr tablet Take 1 tablet (10 mg total) by mouth at bedtime. 09/10/23   Tobi Fortes, MD  rosuvastatin  (CRESTOR ) 10 MG tablet Take 1 tablet (10 mg total) by mouth daily. 07/13/23   Tobi Fortes, MD  spironolactone  (ALDACTONE ) 50 MG tablet Take 1 tablet (50 mg total) by mouth daily. 06/09/23   Tobi Fortes, MD  tiZANidine  (ZANAFLEX ) 4 MG tablet TAKE 1 TABLET(4 MG) BY MOUTH EVERY 6 HOURS FOR UP TO 60 DOSES AS NEEDED FOR MUSCLE SPASMS 10/30/23   Tobi Fortes, MD                                                                                                                                     Past Surgical History Past Surgical History:  Procedure Laterality Date   ABDOMINAL HYSTERECTOMY  1997   APPENDECTOMY     CARDIAC CATHETERIZATION  2015   CARPAL TUNNEL RELEASE Right    CESAREAN SECTION  1987   CHOLECYSTECTOMY OPEN  1998  COLONOSCOPY  November 2012   EYE SURGERY     CATARACTS REMOVED 09/09/17 and 09/16/17   HERNIA REPAIR     INCISIONAL HERNIA REPAIR N/A 12/26/2015   Procedure: LAPAROSCOPIC REPAIR INCISIONAL HERNIA WITH MESH;  Surgeon: Boyce Byes, MD;  Location: [facility] OR;  Service: General;  Laterality: N/A;   INSERTION OF MESH N/A 12/26/2015   Procedure: INSERTION OF MESH;  Surgeon: Boyce Byes, MD;  Location: [facility] OR;  Service: General;  Laterality: N/A;   LAPAROSCOPIC ABDOMINAL EXPLORATION  1997   LAPAROSCOPIC INCISIONAL / UMBILICAL / VENTRAL HERNIA REPAIR  12/26/2015   repair of incarcerated incisional hernia with mesh   LIVER CYST REMOVAL  2014   MELANOMA EXCISION  January 2013   removed from back   SHOULDER SURGERY Left 2010   Humeral Head Microfracture    TUBAL LIGATION     UPPER GASTROINTESTINAL ENDOSCOPY  November 2012   Family History Family History  Adopted: Yes  Problem Relation Age of Onset   Bipolar disorder Mother        never diagnosed but patient suspects mother had bipolar disorder.Aaron AasAaron AasAaron AasAaron Aaspt was adopeted, only knew birth mother for a short period of time.   Anxiety disorder Mother    Cirrhosis Mother    Diabetes Mother    Turner syndrome Daughter    Cancer Daughter    Anxiety disorder Daughter    Bipolar disorder Daughter    Interstitial cystitis Daughter    ADD / ADHD Neg Hx    Alcohol abuse Neg Hx    Drug abuse Neg Hx    Dementia Neg Hx    Depression Neg Hx    OCD Neg Hx    Paranoid behavior Neg Hx    Schizophrenia Neg Hx    Seizures Neg Hx    Sexual abuse Neg Hx    Physical abuse Neg Hx    Colon cancer Neg Hx      Social History Social History   Tobacco Use   Smoking status: Former    Types: E-cigarettes   Smokeless tobacco: Never   Tobacco comments:    Smokes hemp every night to help with her pain  Vaping Use   Vaping status: Every Day   Start date: 04/18/2019   Substances: Nicotine-salt  Substance Use Topics   Alcohol use: No    Comment: 12/26/2015 'quit in ~ 1997"   Drug use: Yes    Types: Other-see comments, Marijuana    Comment: 12/26/2015 "quit in ~  2010".   smokes CBD with 0.3% THC nightly   Allergies Barium sulfate, Barium-containing compounds, Bee venom, Haemophilus influenzae, Influenza virus vaccine, Seldane [terfenadine], Sulfa antibiotics, Amitriptyline, Lisinopril, Lithium , Tetracyclines & related, Celebrex  [celecoxib ], Dexamethasone , Metformin  and related, Prednisone, Trazodone  and nefazodone, Zinc, Aspirin, Ativan  [lorazepam ], Erythromycin, and Lipitor [atorvastatin]  Review of Systems Review of Systems  Respiratory:  Positive for shortness of breath.   Cardiovascular:  Positive for chest pain.    Physical Exam Vital Signs  I have reviewed the triage vital signs BP (!) 153/78 (BP Location: Left Arm)   Pulse 75   Temp 98.2 F (36.8 C) (Oral)   Resp 16   SpO2 100%   Physical Exam Vitals and nursing note reviewed.  Constitutional:      General: She is not in acute distress.    Appearance: She is well-developed. She is ill-appearing.  HENT:     Head: Normocephalic and atraumatic.  Eyes:     Conjunctiva/sclera: Conjunctivae normal.  Cardiovascular:  Rate and Rhythm: Normal rate and regular rhythm.     Heart sounds: No murmur heard. Pulmonary:     Effort: Pulmonary effort is normal. No respiratory distress.     Breath sounds: Normal breath sounds.  Abdominal:     Palpations: Abdomen is soft.     Tenderness: There is no abdominal tenderness.  Musculoskeletal:        General: No swelling.     Cervical back: Neck supple.  Skin:    General: Skin is warm  and dry.     Capillary Refill: Capillary refill takes less than 2 seconds.  Neurological:     Mental Status: She is alert.  Psychiatric:        Mood and Affect: Mood normal.     ED Results and Treatments Labs (all labs ordered are listed, but only abnormal results are displayed) Labs Reviewed  URINALYSIS, ROUTINE W REFLEX MICROSCOPIC - Abnormal; Notable for the following components:      Result Value   Color, Urine STRAW (*)    All other components within normal limits  CBC WITH DIFFERENTIAL/PLATELET  COMPREHENSIVE METABOLIC PANEL WITH GFR  TROPONIN I (HIGH SENSITIVITY)                                                                                                                          Radiology No results found.  Pertinent labs & imaging results that were available during my care of the patient were reviewed by me and considered in my medical decision making (see MDM for details).  Medications Ordered in ED Medications  nitroGLYCERIN  (NITROSTAT ) SL tablet 0.4 mg (0.4 mg Sublingual Given 12/05/23 1518)                                                                                                                                     Procedures Procedures  (including critical care time)  Medical Decision Making / ED Course   This patient presents to the ED for concern of chest pain, this involves an extensive number of treatment options, and is a complaint that carries with it a high risk of complications and morbidity.  The differential diagnosis includes ACS, Aortic Dissection, Pneumothorax, Pneumonia, Esophageal Rupture, PE, Tamponade/Pericardial Effusion, pericarditis, esophageal spasm, dysrhythmia, GERD, costochondritis.  MDM: Patient seen emergency room for evaluation of chest pain.  Physical exam reveals an uncomfortable appearing patient but cardiopulmonary exam  otherwise unremarkable.  ECG nonischemic.  Laboratory evaluation is unremarkable including negative  troponin and high-sensitivity delta troponin.  Chest x-ray unremarkable.  Nitroglycerin  given with mild symptomatic improvement but on reevaluation her chest pain has returned.  PE study then obtained given persistent chest pain of unknown origin and this was reassuringly negative.  Spoke with the cardiologist on-call Dr. Annitta Kindler who is recommending admission to [facility] given no ability for stress testing at [facility] over the weekend.  Patient transferred to [facility].   Additional history obtained:  -External records from outside source obtained and reviewed including: Chart review including previous notes, labs, imaging, consultation notes   Lab Tests: -I ordered, reviewed, and interpreted labs.   The pertinent results include:   Labs Reviewed  URINALYSIS, ROUTINE W REFLEX MICROSCOPIC - Abnormal; Notable for the following components:      Result Value   Color, Urine STRAW (*)    All other components within normal limits  CBC WITH DIFFERENTIAL/PLATELET  COMPREHENSIVE METABOLIC PANEL WITH GFR  TROPONIN I (HIGH SENSITIVITY)      EKG   EKG Interpretation Date/Time:  Saturday Dec 05 2023 14:37:07 EDT Ventricular Rate:  69 PR Interval:  172 QRS Duration:  141 QT Interval:  446 QTC Calculation: 478 R Axis:   -80  Text Interpretation: Sinus rhythm Left bundle branch block Confirmed by Amberlea Spagnuolo ([id number]) on 12/05/2023 9:08:26 PM         Imaging Studies ordered: I ordered imaging studies including chest x-ray, PE study I independently visualized and interpreted imaging. I agree with the radiologist interpretation   Medicines ordered and prescription drug management: Meds ordered this encounter  Medications   nitroGLYCERIN  (NITROSTAT ) SL tablet 0.4 mg    -I have reviewed the patients home medicines and have made adjustments as needed  Critical interventions none  Consultations Obtained: I requested consultation with the cardiologist on-call,  and discussed lab  and imaging findings as well as pertinent plan - they recommend: Cardiology admission   Cardiac Monitoring: The patient was maintained on a cardiac monitor.  I personally viewed and interpreted the cardiac monitored which showed an underlying rhythm of: NSR  Social Determinants of Health:  Factors impacting patients care include: none   Reevaluation: After the interventions noted above, I reevaluated the patient and found that they have :stayed the same  Co morbidities that complicate the patient evaluation  Past Medical History:  Diagnosis Date   Acute pansinusitis 05/03/2019   Allergy    Anxiety    Arthritis    "pretty much all over; mainly neck and back" (12/26/2015)   Asthma    Atypical squamous cell changes of undetermined significance (ASCUS) on vaginal cytology 04/11/2022   Bipolar 1 disorder (HCC)    Bipolar disorder (HCC)    Bipolar I disorder, most recent episode depressed (HCC) 07/04/2014   Cervical cancer (HCC)    "stage I"   Chronic back pain    Chronic bronchitis (HCC)    Colon cancer screening 02/15/2018   COPD (chronic obstructive pulmonary disease) (HCC)    Degenerative disc disease    Depression    Dysrhythmia    Fibromyalgia    GERD (gastroesophageal reflux disease)    Hepatic cyst    High cholesterol    History of hiatal hernia    Hypertension 08/04/2012   IBS (irritable bowel syndrome) Diagnosed November 2012   Incisional hernia with gangrene and obstruction 12/26/2015   Melanoma of back (HCC)    Ovarian cancer (  HCC)    "stage II"   Plantar fasciitis, bilateral    Preventative health care 04/11/2022   Sleep apnea    cpap coming  "soon" (12/26/2015)      Dispostion: I considered admission for this patient, and patient will require hospital admission for persistent chest pain     Final Clinical Impression(s) / ED Diagnoses Final diagnoses:  None     @PCDICTATION @    Karlyn Overman, MD 12/05/23 2109
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
## 2023-12-06 ENCOUNTER — Telehealth: Payer: Self-pay | Admitting: Physician Assistant

## 2023-12-06 DIAGNOSIS — J449 Chronic obstructive pulmonary disease, unspecified: Secondary | ICD-10-CM | POA: Diagnosis not present

## 2023-12-06 DIAGNOSIS — I2510 Atherosclerotic heart disease of native coronary artery without angina pectoris: Secondary | ICD-10-CM | POA: Diagnosis not present

## 2023-12-06 DIAGNOSIS — Z79899 Other long term (current) drug therapy: Secondary | ICD-10-CM | POA: Diagnosis not present

## 2023-12-06 DIAGNOSIS — I10 Essential (primary) hypertension: Secondary | ICD-10-CM

## 2023-12-06 DIAGNOSIS — Z87891 Personal history of nicotine dependence: Secondary | ICD-10-CM | POA: Diagnosis not present

## 2023-12-06 DIAGNOSIS — R079 Chest pain, unspecified: Secondary | ICD-10-CM | POA: Diagnosis not present

## 2023-12-06 DIAGNOSIS — Q245 Malformation of coronary vessels: Secondary | ICD-10-CM | POA: Diagnosis not present

## 2023-12-06 DIAGNOSIS — R072 Precordial pain: Secondary | ICD-10-CM

## 2023-12-06 LAB — CBC
HCT: 42.1 % (ref 36.0–46.0)
Hemoglobin: 13.9 g/dL (ref 12.0–15.0)
MCH: 28.7 pg (ref 26.0–34.0)
MCHC: 33 g/dL (ref 30.0–36.0)
MCV: 86.8 fL (ref 80.0–100.0)
Platelets: 325 10*3/uL (ref 150–400)
RBC: 4.85 MIL/uL (ref 3.87–5.11)
RDW: 12.8 % (ref 11.5–15.5)
WBC: 8.9 10*3/uL (ref 4.0–10.5)
nRBC: 0 % (ref 0.0–0.2)

## 2023-12-06 LAB — BASIC METABOLIC PANEL WITH GFR
Anion gap: 10 (ref 5–15)
BUN: 14 mg/dL (ref 6–20)
CO2: 24 mmol/L (ref 22–32)
Calcium: 9.3 mg/dL (ref 8.9–10.3)
Chloride: 103 mmol/L (ref 98–111)
Creatinine, Ser: 0.93 mg/dL (ref 0.44–1.00)
GFR, Estimated: >60 mL/min (ref >60)
Glucose, Bld: 109 mg/dL — ABNORMAL HIGH (ref 70–99)
Potassium: 4.1 mmol/L (ref 3.5–5.1)
Sodium: 137 mmol/L (ref 135–145)

## 2023-12-06 LAB — CREATININE, SERUM
Creatinine, Ser: 0.92 mg/dL (ref 0.44–1.00)
GFR, Estimated: >60 mL/min (ref >60)

## 2023-12-06 LAB — TROPONIN I (HIGH SENSITIVITY): Troponin I (High Sensitivity): 12 ng/L (ref <18)

## 2023-12-06 LAB — HIV ANTIBODY (ROUTINE TESTING W REFLEX): HIV Screen 4th Generation wRfx: NONREACTIVE

## 2023-12-06 NOTE — Discharge Summary (Cosign Needed)
 Discharge Summary    Patient ID: KIELI FORNES MRN: [id number]; DOB: 1966-03-26  Admit date: 12/05/2023 Discharge date: 12/06/2023  PCP:  Tobi Fortes, MD   [facility] Providers Cardiologist:  Armida Lander, MD        Discharge Diagnoses    Principal Problem:   Chest pain Active Problems:   Anomalous coronary artery origin   Diagnostic Studies/Procedures    See imaging tab below _____________   History of Present Illness     MASHAL GARTLEY is a 58 y.o. female with anomalous LCx with retrograde course, LBBB, COPD, bipolar disorder, hypertension, hyperlipidemia, family history of premature CAD, obesity, former smoker presented to [facility] 5/10 with chest pain. Prior echo in 2016 showed EF 55-60%. Cor CTA in 2022 showed CAC 0, LCX is an anomalous artery arising form the same ostium as the RCA in the right coronary cusp; it takes a benign posterior course between the aortic valve, the right atrium and left atrium, no evidence of CAD. She was reported to be a poor historian but presenting with chest pain for the last month with mixed features. She had reported symptoms for several weeks associated with nausea/vomiting, with some exertional component. EMS gave her aspirin and nitroglycerin  with some improvement. She was at [facility], ER, blood pressure 153/78 mmHg, troponin x 2 negative, EKG shows sinus rhythm with left bundle branch block as noted before. She reported being under a lot of stress as she cares for her husband who has severe neuropathy. CXR showed NAD. CTA ruled out PE but did note hepatic steatosis, benign appearing pulmonary nodules, and multiple large simple left renal cysts with radiologist outlining no follow-up imaging needed, + aortic atherosclerosis. She was admitted to [facility] for further evaluation.   Hospital Course     Follow-up troponin was normal overnight. Remainder of labs were nonacute. Given continuous chest pain for hours with negative  troponins, and considering no CAD on coronary CTA in 2022, Dr. Alda Amas suspected noncardiac chest pain. She does have anomalous LCX, but benign retrograde course. He recommended outpatient stress test and echo - as she requested all her care fall in [place], this would be a Lexiscan nuclear stress test (she did not think she could do treadmill due to h/o back problems). Patient agreeable to plan. Message sent to [facility] office to help order and arrange. Dr. Alda Amas has seen and examined the patient today and feels she is stable for discharge. We held off rx'ing ASA at dc given h/o stomach upset with this. I discussed incidental findings on CTA with patient, advised she discuss with PCP at next OV. She reports she has upcoming follow-up with them.    Did the patient have an acute coronary syndrome (MI, NSTEMI, STEMI, etc) this admission?:  No                               Did the patient have a percutaneous coronary intervention (stent / angioplasty)?:  No.   _____________  Discharge Vitals Blood pressure (!) 141/74, pulse 73, temperature 98.6 F (37 C), temperature source Oral, resp. rate 20, height 5\' 1"  (1.549 m), weight 79.7 kg, SpO2 96%.  Filed Weights   12/05/23 2054  Weight: 79.7 kg    Labs & Radiologic Studies    CBC Recent Labs    12/05/23 1511 12/06/23 0305  WBC 10.5 8.9  NEUTROABS 6.9  --   HGB 14.4 13.9  HCT 42.8 42.1  MCV 86.1 86.8  PLT 357 325   Basic Metabolic Panel Recent Labs    09/81/19 1511 12/06/23 0305  NA 136 137  K 3.5 4.1  CL 102 103  CO2 22 24  GLUCOSE 96 109*  BUN 15 14  CREATININE 0.89 0.93  0.92  CALCIUM  9.4 9.3   Liver Function Tests Recent Labs    12/05/23 1511  AST 19  ALT 21  ALKPHOS 72  BILITOT 0.8  PROT 7.1  ALBUMIN 3.7   No results for input(s): "LIPASE", "AMYLASE" in the last 72 hours. High Sensitivity Troponin:   Recent Labs  Lab 12/05/23 1511 12/05/23 1636 12/06/23 0751  TROPONINIHS 9 9 12      _____________   CT Angio Chest PE W and/or Wo Contrast Result Date: 12/05/2023 CLINICAL DATA:  Chest pain. EXAM: CT ANGIOGRAPHY CHEST WITH CONTRAST TECHNIQUE: Multidetector CT imaging of the chest was performed using the standard protocol during bolus administration of intravenous contrast. Multiplanar CT image reconstructions and MIPs were obtained to evaluate the vascular anatomy. RADIATION DOSE REDUCTION: This exam was performed according to the departmental dose-optimization program which includes automated exposure control, adjustment of the mA and/or kV according to patient size and/or use of iterative reconstruction technique. CONTRAST:  75mL OMNIPAQUE  IOHEXOL  350 MG/ML SOLN COMPARISON:  Nov 29, 2020 FINDINGS: Cardiovascular: There is moderate severity calcification of the aortic arch, without evidence of aortic aneurysm. Satisfactory opacification of the pulmonary arteries to the segmental level. No evidence of pulmonary embolism. Normal heart size. No pericardial effusion. Mediastinum/Nodes: No enlarged mediastinal, hilar, or axillary lymph nodes. Thyroid  gland, trachea, and esophagus demonstrate no significant findings. Lungs/Pleura: A stable, benign 3 mm calcified pulmonary nodule is seen within the posterolateral aspect of the right middle lobe (axial CT image 95, CT series 6). A stable, likely benign ill-defined 3 mm right upper lobe noncalcified pulmonary nodule is present (axial CT image 76, CT series 6). Mild lingular and left basilar linear scarring and/or atelectasis is seen. No acute infiltrate, pleural effusion or pneumothorax is identified. Upper Abdomen: There is diffuse fatty infiltration of the liver parenchyma. Multiple surgical clips are seen within the gallbladder fossa. Multiple large simple cysts are seen within the left kidney. Musculoskeletal: No chest wall abnormality. No acute or significant osseous findings. Review of the MIP images confirms the above findings. IMPRESSION: 1. No evidence of  pulmonary embolism or other acute intrathoracic process. 2. Hepatic steatosis. 3. Multiple large simple left renal cysts. No follow-up imaging is recommended. This recommendation follows ACR consensus guidelines: Management of the Incidental Renal Mass on CT: A White Paper of the ACR Incidental Findings Committee. J Am Coll Radiol [phone]. 4. Aortic atherosclerosis. Electronically Signed   By: Virgle Grime M.D.   On: 12/05/2023 17:16   DG Chest Port 1 View Result Date: 12/05/2023 CLINICAL DATA:  Worsening chest pain for several weeks. EXAM: PORTABLE CHEST 1 VIEW COMPARISON:  May 20, 2023 FINDINGS: The heart size and mediastinal contours are within normal limits. Mild, stable linear scarring and/or atelectasis is seen within the mid to lower left lung. No acute infiltrate, pleural effusion or pneumothorax is identified. The visualized skeletal structures are unremarkable. IMPRESSION: No acute cardiopulmonary disease. Electronically Signed   By: Virgle Grime M.D.   On: 12/05/2023 17:10   Disposition   Pt is being discharged home today in good condition.  Follow-up Plans & Appointments     Follow-up Information     [facility] at [facility]  [place] Follow up.   Specialty: Cardiology Why: Office will call you to schedule your stress test, heart ultrasound, and follow-up appointment. Call office if you have not heard back in 3 business days. Contact information: [address] [place]  [address] [phone]        Tobi Fortes, MD Follow up.   Specialty: Internal Medicine Why: Your CT scan incidentally picked up hepatic steatosis (also called fatty liver) as well as simple kidney cysts and suspected benign-appearing pulmonary nodules. Please discuss monitoring with your PCP at next visit. Contact information: [address] [place] [address] [phone]                Discharge Instructions     Diet - low sodium heart healthy    Complete by: As directed    Increase activity slowly   Complete by: As directed         Discharge Medications   Allergies as of 12/06/2023       Reactions   Barium Sulfate Nausea And Vomiting   Stomach cramps, extreme diarrhea, and vomiting   Barium-containing Compounds Other (See Comments)   Stomach cramps, extreme diarrhea, and vomiting   Bee Venom Anaphylaxis   Haemophilus Influenzae Swelling   Trouble swallowing   Influenza Virus Vaccine Swelling   Trouble swallowing Trouble swallowing   Seldane [terfenadine] Nausea And Vomiting, Other (See Comments)   CAUSES TOP LAYER OF SKIN TO PEEL   Sulfa Antibiotics Anaphylaxis   Amitriptyline    Other reaction(s): Other (See Comments) Mood instability   Lisinopril Cough   Lithium  Other (See Comments)   Agitation and hostility   Tetracyclines & Related Hives, Nausea And Vomiting   Celebrex  [celecoxib ] Nausea And Vomiting   Dexamethasone     Elevated blood sugar,nausea,diarrhea, muscle weakness   Metformin  And Related Nausea And Vomiting   Prednisone    Other reaction(s): Generalized Edema (intolerance)   Trazodone  And Nefazodone    Sick   Zinc Nausea And Vomiting   Aspirin Rash, Other (See Comments)   Upset stomach   Ativan  [lorazepam ] Other (See Comments)   Irritability   Erythromycin Nausea And Vomiting, Rash, Other (See Comments)   Cramps   Lipitor [atorvastatin] Nausea And Vomiting        Medication List     TAKE these medications    albuterol  108 (90 Base) MCG/ACT inhaler Commonly known as: VENTOLIN  HFA Inhale 2 puffs into the lungs every 6 (six) hours as needed for wheezing or shortness of breath.   amLODipine  10 MG tablet Commonly known as: NORVASC  Take 1 tablet (10 mg total) by mouth daily.   clobetasol  ointment 0.05 % Commonly known as: TEMOVATE  Apply to affected area every night for 4 weeks, then every other day for 4 weeks and then twice a week for 4 weeks or until resolution. What changed:   how much to take how to take this when to take this reasons to take this   EPINEPHrine  0.3 mg/0.3 mL Soaj injection Commonly known as: EPI-PEN Inject 0.3 mg into the muscle as needed for anaphylaxis. Inject into the muscle.   losartan -hydrochlorothiazide  100-25 MG tablet Commonly known as: HYZAAR Take 1 tablet by mouth daily.   rosuvastatin  10 MG tablet Commonly known as: Crestor  Take 1 tablet (10 mg total) by mouth daily.   spironolactone  50 MG tablet Commonly known as: ALDACTONE  Take 1 tablet (50 mg total) by mouth daily.   tiZANidine  4 MG tablet Commonly known as:  ZANAFLEX  TAKE 1 TABLET(4 MG) BY MOUTH EVERY 6 HOURS FOR UP TO 60 DOSES AS NEEDED FOR MUSCLE SPASMS           Outstanding Labs/Studies   Stress test, echo as above  Duration of Discharge Encounter: APP Time: 15 minutes   Signed, Alexandria Angel, PA-C 12/06/2023, 10:42 AM   Patient seen and examined.  Agree with below documentation.  I spent 33 minutes in patient's discharge encounter today, including reviewing patient's labs and notes, prior imaging, interviewing and examining patient, and discussing plan of care.  Will plan for outpatient stress test and follow-up in [place].  Wendie Hamburg, MD

## 2023-12-06 NOTE — Plan of Care (Signed)

## 2023-12-06 NOTE — Progress Notes (Signed)
 Rounding Note    Patient Name: [name] Date of Encounter: 12/06/2023  [facility] Cardiologist: Armida Lander, MD   Subjective   BP 141/74. Cr 0.9. Hgb 13.9.  Reports chest pain lasted hours yesterday, was like band around her chest.   Inpatient Medications    Scheduled Meds:  amLODipine   10 mg Oral Daily   aspirin EC  81 mg Oral Daily   enoxaparin  (LOVENOX ) injection  40 mg Subcutaneous Q24H   hydrochlorothiazide   25 mg Oral Daily   losartan   100 mg Oral Daily   rosuvastatin   10 mg Oral Daily   spironolactone   50 mg Oral Daily   Continuous Infusions:  PRN Meds:    Vital Signs    Vitals:   12/05/23 2342 12/06/23 0313 12/06/23 0726 12/06/23 0912  BP: 125/81 (!) 126/106 (!) 117/53 (!) 141/74  Pulse: 86 (!) 56 61 73  Resp: 20 14 15 20   Temp: 98 F (36.7 C)  98.6 F (37 C)   TempSrc: Oral Oral Oral   SpO2: 96% 96% 99% 96%  Weight:      Height:        Intake/Output Summary (Last 24 hours) at 12/06/2023 0932 Last data filed at 12/06/2023 0918 Gross per 24 hour  Intake 480 ml  Output 400 ml  Net 80 ml      12/05/2023    8:54 PM 09/10/2023    9:31 AM 08/19/2023    9:10 AM  Last 3 Weights  Weight (lbs) 175 lb 9.6 oz 181 lb 12.8 oz 174 lb  Weight (kg) 79.652 kg 82.464 kg 78.926 kg      Telemetry    NSR - Personally Reviewed  ECG    Sinus bradycardia, rate 56, LBBB - Personally Reviewed  Physical Exam   GEN: No acute distress.   Neck: No JVD Cardiac: RRR, no murmurs, rubs, or gallops.  Respiratory: Clear to auscultation bilaterally. GI: Soft, nontender, non-distended  MS: No edema; No deformity. Neuro:  Nonfocal  Psych: Normal affect   Labs    High Sensitivity Troponin:   Recent Labs  Lab 12/05/23 1511 12/05/23 1636 12/06/23 0751  TROPONINIHS 9 9 12      Chemistry Recent Labs  Lab 12/05/23 1511 12/06/23 0305  NA 136 137  K 3.5 4.1  CL 102 103  CO2 22 24  GLUCOSE 96 109*  BUN 15 14  CREATININE 0.89 0.93  0.92   CALCIUM  9.4 9.3  PROT 7.1  --   ALBUMIN 3.7  --   AST 19  --   ALT 21  --   ALKPHOS 72  --   BILITOT 0.8  --   GFRNONAA >60 >60  >60  ANIONGAP 12 10    Lipids No results for input(s): "CHOL", "TRIG", "HDL", "LABVLDL", "LDLCALC", "CHOLHDL" in the last 168 hours.  Hematology Recent Labs  Lab 12/05/23 1511 12/06/23 0305  WBC 10.5 8.9  RBC 4.97 4.85  HGB 14.4 13.9  HCT 42.8 42.1  MCV 86.1 86.8  MCH 29.0 28.7  MCHC 33.6 33.0  RDW 12.6 12.8  PLT 357 325   Thyroid  No results for input(s): "TSH", "FREET4" in the last 168 hours.  BNPNo results for input(s): "BNP", "PROBNP" in the last 168 hours.  DDimer No results for input(s): "DDIMER" in the last 168 hours.   Radiology    CT Angio Chest PE W and/or Wo Contrast Result Date: 12/05/2023 CLINICAL DATA:  Chest pain. EXAM: CT ANGIOGRAPHY CHEST WITH  CONTRAST TECHNIQUE: Multidetector CT imaging of the chest was performed using the standard protocol during bolus administration of intravenous contrast. Multiplanar CT image reconstructions and MIPs were obtained to evaluate the vascular anatomy. RADIATION DOSE REDUCTION: This exam was performed according to the departmental dose-optimization program which includes automated exposure control, adjustment of the mA and/or kV according to patient size and/or use of iterative reconstruction technique. CONTRAST:  75mL OMNIPAQUE  IOHEXOL  350 MG/ML SOLN COMPARISON:  Nov 29, 2020 FINDINGS: Cardiovascular: There is moderate severity calcification of the aortic arch, without evidence of aortic aneurysm. Satisfactory opacification of the pulmonary arteries to the segmental level. No evidence of pulmonary embolism. Normal heart size. No pericardial effusion. Mediastinum/Nodes: No enlarged mediastinal, hilar, or axillary lymph nodes. Thyroid  gland, trachea, and esophagus demonstrate no significant findings. Lungs/Pleura: A stable, benign 3 mm calcified pulmonary nodule is seen within the posterolateral aspect of  the right middle lobe (axial CT image 95, CT series 6). A stable, likely benign ill-defined 3 mm right upper lobe noncalcified pulmonary nodule is present (axial CT image 76, CT series 6). Mild lingular and left basilar linear scarring and/or atelectasis is seen. No acute infiltrate, pleural effusion or pneumothorax is identified. Upper Abdomen: There is diffuse fatty infiltration of the liver parenchyma. Multiple surgical clips are seen within the gallbladder fossa. Multiple large simple cysts are seen within the left kidney. Musculoskeletal: No chest wall abnormality. No acute or significant osseous findings. Review of the MIP images confirms the above findings. IMPRESSION: 1. No evidence of pulmonary embolism or other acute intrathoracic process. 2. Hepatic steatosis. 3. Multiple large simple left renal cysts. No follow-up imaging is recommended. This recommendation follows ACR consensus guidelines: Management of the Incidental Renal Mass on CT: A White Paper of the ACR Incidental Findings Committee. J Am Coll Radiol [phone]. 4. Aortic atherosclerosis. Electronically Signed   By: Virgle Grime M.D.   On: 12/05/2023 17:16   DG Chest Port 1 View Result Date: 12/05/2023 CLINICAL DATA:  Worsening chest pain for several weeks. EXAM: PORTABLE CHEST 1 VIEW COMPARISON:  May 20, 2023 FINDINGS: The heart size and mediastinal contours are within normal limits. Mild, stable linear scarring and/or atelectasis is seen within the mid to lower left lung. No acute infiltrate, pleural effusion or pneumothorax is identified. The visualized skeletal structures are unremarkable. IMPRESSION: No acute cardiopulmonary disease. Electronically Signed   By: Virgle Grime M.D.   On: 12/05/2023 17:10    Cardiac Studies     Patient Profile     58 y.o. female with COPD, bipolar disorder, hypertension, hyperlipidemia, family history of premature CAD, anomalous LCx, retrograde course, obesity, former smoker who is  being seen 12/05/2023 for the evaluation of chest pain.   Assessment & Plan    Chest Pain: Coronary CTA in 2022 showed no CAD, calcium  score 0, anomalous LCX with retrograde course (benign).  She presented with chest pain and negative troponins.  EKG with sinus rhythm, LBBB (old).  -Given continuous chest pain for hours with negative troponins, and considering no CAD on coronary CTA in 2022, suspect noncardiac chest pain.  Does have anomalous LCX, but benign retrograde course.  Recommend outpatient stress test, she is agreeable to this.  Hypertension: on amlodipine  10 mg daily and losartan  100 mg daily and spironolactone  50 mg daily and hydrochlorothiazide  25 mg daily  Hyperlipidemia: on rosuvastatin  10 mg daily  Disposition: plan discharge today.  She requests all her care to be in [place], will arrange for NST< echo and outpatient f/u  in [place].  For questions or updates, please contact [facility] Please consult [email or web address] for contact info under        Signed, Wendie Hamburg, MD  12/06/2023, 9:32 AM

## 2023-12-06 NOTE — Progress Notes (Signed)
   12/06/23 1134  Mobility  Activity Ambulated independently in hallway;Ambulated independently in room  Level of Assistance Independent  Assistive Device None  Distance Ambulated (ft) 960 ft  Activity Response Tolerated well  Mobility Referral Yes  Mobility visit 1 Mobility  Mobility Specialist Start Time (ACUTE ONLY) 1127  Mobility Specialist Stop Time (ACUTE ONLY) 1134  Mobility Specialist Time Calculation (min) (ACUTE ONLY) 7 min   Pt agreeable to mobility session. Ambulated in hallway, no AD required. Tolerated well, asx throughout. Returned pt to room, all needs met. Eager for d/c.   Zaydah Nawabi Mobility Specialist Please contact via SecureChat or  Rehab office at [phone]

## 2023-12-06 NOTE — Telephone Encounter (Signed)
 Hi team! This patient was seen by Dr. Alda Amas down at [facility] this weekend who recommended outpatient echocardiogram and Lexiscan nuclear stress test in [place] with follow-up appointment in [place] thereafter. She is being discharged today. Diagnosis is chest pain/precordial pain.  Can you help place orders under him and arrange/schedule follow-up? Patient will need to be called with instructions. Thank you!

## 2023-12-06 NOTE — Care Management Obs Status (Signed)
 MEDICARE OBSERVATION STATUS NOTIFICATION   Patient Details  Name: [name] MRN: [id number] Date of Birth: 1966-06-05   Medicare Observation Status Notification Given:  Yes    Harman Ferrin G., RN 12/06/2023, 11:13 AM

## 2023-12-06 NOTE — Care Management CC44 (Signed)
 Condition Code 44 Documentation Completed  Patient Details  Name: [name] MRN: [id number] Date of Birth: 08/09/1965   Condition Code 44 given:  yes  Patient signature on Condition Code 44 notice:   yes Documentation of 2 MD's agreement:   yes Code 44 added to claim:   yes    Jeyden Coffelt G., RN 12/06/2023, 11:15 AM

## 2023-12-06 NOTE — Progress Notes (Signed)
 Discharge instructions provided to the patient, PIV removed, CCMD notified, all the belongings taken by the family, no any concerns.

## 2023-12-07 ENCOUNTER — Encounter: Payer: Self-pay | Admitting: Cardiology

## 2023-12-07 ENCOUNTER — Encounter: Payer: Self-pay | Admitting: *Deleted

## 2023-12-07 NOTE — Telephone Encounter (Signed)
 Instructions for lexi and echo have been sent to pt's my chart.

## 2023-12-07 NOTE — Telephone Encounter (Signed)
 Order placed for echo and lexi.

## 2023-12-07 NOTE — Telephone Encounter (Signed)
 Called pt with date and time of stress test and echo. No answer. Left msg. To call back.

## 2023-12-08 ENCOUNTER — Other Ambulatory Visit: Payer: Self-pay | Admitting: Internal Medicine

## 2023-12-08 DIAGNOSIS — G8929 Other chronic pain: Secondary | ICD-10-CM

## 2023-12-08 NOTE — Telephone Encounter (Signed)
 Pt notified of date and time of stress test and echo.

## 2023-12-15 ENCOUNTER — Encounter: Payer: Self-pay | Admitting: Internal Medicine

## 2023-12-15 ENCOUNTER — Ambulatory Visit (INDEPENDENT_AMBULATORY_CARE_PROVIDER_SITE_OTHER): Payer: 59 | Admitting: Internal Medicine

## 2023-12-15 VITALS — BP 103/58 | HR 63 | Ht 61.0 in | Wt 175.4 lb

## 2023-12-15 DIAGNOSIS — E782 Mixed hyperlipidemia: Secondary | ICD-10-CM

## 2023-12-15 DIAGNOSIS — I10 Essential (primary) hypertension: Secondary | ICD-10-CM | POA: Diagnosis not present

## 2023-12-15 DIAGNOSIS — R079 Chest pain, unspecified: Secondary | ICD-10-CM

## 2023-12-15 DIAGNOSIS — N3941 Urge incontinence: Secondary | ICD-10-CM

## 2023-12-15 DIAGNOSIS — N951 Menopausal and female climacteric states: Secondary | ICD-10-CM

## 2023-12-15 NOTE — Assessment & Plan Note (Signed)
 Recent overnight hospital admission 5/10 - 5/11 after presenting with chest pain.  Fortunately, cardiac workup was reassuring.  No acute findings on CTA chest.  Evaluated by cardiology and will undergo an NST and echocardiogram next month.  Cardiology follow-up is scheduled for 6/10.  She has started taking ASA 81 mg.  She endorses chest pain intermittently with exertion.  Denies chest pain currently.

## 2023-12-15 NOTE — Patient Instructions (Signed)
It was a pleasure to see you today.  Thank you for giving [place] the opportunity to be involved in your care.  Below is a brief recap of your visit and next steps.  We will plan to see you again in 6 months.  Summary No medication changes today Repeat lipid panel Follow up in 6 months

## 2023-12-15 NOTE — Assessment & Plan Note (Signed)
 Symptoms are stable.  We previously attempted to resume [place]  but it is not covered by insurance.  She is not interested in trying other medications at this time.

## 2023-12-15 NOTE — Assessment & Plan Note (Signed)
 Symptoms have improved.  She was most recently prescribed oxybutynin  but is not taking it currently.  No indication to resume treatment today.

## 2023-12-15 NOTE — Progress Notes (Signed)
 Established Patient Office Visit  Subjective   Patient ID: [name], female    DOB: [birth year]  Age: 58 y.o. MRN: [id number]  Chief Complaint  Patient presents with   Care Management    Three month follow up    [name] returns to care today for routine follow-up.  She was last evaluated by me on 2/13.  Oxybutynin  was prescribed in the setting of urinary incontinence. In the interim, she presented to the emergency department 5/10 endorsing chest pain.  Admitted overnight for observation.  Labs were reassuring.  Noncardiac chest pain suspected.  There have otherwise been no acute interval events. [name] reports feeling fairly well today.  She is anxious about upcoming cardiac testing.  She endorses tightness in her chest with exertion but states that overall symptoms are significantly improved from chest pain she experienced that prompted her to present to the emergency department.  She does not have any additional concerns to discuss today.  Past Medical History:  Diagnosis Date   Acute pansinusitis 05/03/2019   Allergy    Anxiety    Arthritis    "pretty much all over; mainly neck and back" (12/26/2015)   Asthma    Atypical squamous cell changes of undetermined significance (ASCUS) on vaginal cytology 04/11/2022   Bipolar 1 disorder (HCC)    Bipolar disorder (HCC)    Bipolar I disorder, most recent episode depressed (HCC) 07/04/2014   Cervical cancer (HCC)    "stage I"   Chronic back pain    Chronic bronchitis (HCC)    Colon cancer screening 02/15/2018   COPD (chronic obstructive pulmonary disease) (HCC)    Degenerative disc disease    Depression    Dysrhythmia    Fibromyalgia    GERD (gastroesophageal reflux disease)    Hepatic cyst    High cholesterol    History of hiatal hernia    Hypertension 08/04/2012   IBS (irritable bowel syndrome) Diagnosed November 2012   Incisional hernia with gangrene and obstruction 12/26/2015   Melanoma of back (HCC)    Ovarian  cancer (HCC)    "stage II"   Plantar fasciitis, bilateral    Preventative health care 04/11/2022   Sleep apnea    cpap coming  "soon" (12/26/2015)   Past Surgical History:  Procedure Laterality Date   ABDOMINAL HYSTERECTOMY  1997   APPENDECTOMY     CARDIAC CATHETERIZATION  2015   CARPAL TUNNEL RELEASE Right    CESAREAN SECTION  1987   CHOLECYSTECTOMY OPEN  1998   COLONOSCOPY  November 2012   EYE SURGERY     CATARACTS REMOVED 09/09/17 and 09/16/17   HERNIA REPAIR     INCISIONAL HERNIA REPAIR N/A 12/26/2015   Procedure: LAPAROSCOPIC REPAIR INCISIONAL HERNIA WITH MESH;  Surgeon: Boyce Byes, MD;  Location: [facility] OR;  Service: General;  Laterality: N/A;   INSERTION OF MESH N/A 12/26/2015   Procedure: INSERTION OF MESH;  Surgeon: Boyce Byes, MD;  Location: [facility] OR;  Service: General;  Laterality: N/A;   LAPAROSCOPIC ABDOMINAL EXPLORATION  1997   LAPAROSCOPIC INCISIONAL / UMBILICAL / VENTRAL HERNIA REPAIR  12/26/2015   repair of incarcerated incisional hernia with mesh   LIVER CYST REMOVAL  2014   MELANOMA EXCISION  January 2013   removed from back   SHOULDER SURGERY Left 2010   Humeral Head Microfracture    TUBAL LIGATION     UPPER GASTROINTESTINAL ENDOSCOPY  November 2012   Social History   Tobacco Use  Smoking status: Former    Types: E-cigarettes   Smokeless tobacco: Never   Tobacco comments:    Smokes hemp every night to help with her pain  Vaping Use   Vaping status: Every Day   Start date: 04/18/2019   Substances: Nicotine-salt  Substance Use Topics   Alcohol use: No    Comment: 12/26/2015 'quit in ~ 1997"   Drug use: Yes    Types: Other-see comments, Marijuana    Comment: 12/26/2015 "quit in ~  2010".   smokes CBD with 0.3% THC nightly   Family History  Adopted: Yes  Problem Relation Age of Onset   Bipolar disorder Mother        never diagnosed but patient suspects mother had bipolar disorder.Aaron AasAaron AasAaron AasAaron Aaspt was adopeted, only knew birth mother for a short period of time.    Anxiety disorder Mother    Cirrhosis Mother    Diabetes Mother    Turner syndrome Daughter    Cancer Daughter    Anxiety disorder Daughter    Bipolar disorder Daughter    Interstitial cystitis Daughter    ADD / ADHD Neg Hx    Alcohol abuse Neg Hx    Drug abuse Neg Hx    Dementia Neg Hx    Depression Neg Hx    OCD Neg Hx    Paranoid behavior Neg Hx    Schizophrenia Neg Hx    Seizures Neg Hx    Sexual abuse Neg Hx    Physical abuse Neg Hx    Colon cancer Neg Hx    Allergies  Allergen Reactions   Barium Sulfate Nausea And Vomiting    Stomach cramps, extreme diarrhea, and vomiting   Barium-Containing Compounds Other (See Comments)    Stomach cramps, extreme diarrhea, and vomiting   Bee Venom Anaphylaxis   Haemophilus Influenzae Swelling    Trouble swallowing   Influenza Virus Vaccine Swelling    Trouble swallowing Trouble swallowing    Seldane [Terfenadine] Nausea And Vomiting and Other (See Comments)    CAUSES TOP LAYER OF SKIN TO PEEL   Sulfa Antibiotics Anaphylaxis   Amitriptyline     Other reaction(s): Other (See Comments) Mood instability   Lisinopril Cough   Lithium  Other (See Comments)    Agitation and hostility   Tetracyclines & Related Hives and Nausea And Vomiting   Celebrex  [Celecoxib ] Nausea And Vomiting   Dexamethasone      Elevated blood sugar,nausea,diarrhea, muscle weakness   Metformin  And Related Nausea And Vomiting   Prednisone     Other reaction(s): Generalized Edema (intolerance)   Trazodone  And Nefazodone     Sick   Zinc Nausea And Vomiting   Aspirin  Rash and Other (See Comments)    Upset stomach   Ativan  [Lorazepam ] Other (See Comments)    Irritability   Erythromycin Nausea And Vomiting, Rash and Other (See Comments)    Cramps   Lipitor [Atorvastatin] Nausea And Vomiting   Review of Systems  Constitutional:  Negative for chills and fever.  HENT:  Negative for sore throat.   Respiratory:  Negative for cough and shortness of breath.    Cardiovascular:  Negative for chest pain, palpitations and leg swelling.  Gastrointestinal:  Negative for abdominal pain, blood in stool, constipation, diarrhea, nausea and vomiting.  Genitourinary:  Negative for dysuria and hematuria.  Musculoskeletal:  Negative for myalgias.  Skin:  Negative for itching and rash.  Neurological:  Negative for dizziness and headaches.  Psychiatric/Behavioral:  Negative for depression and suicidal ideas.  Objective:     BP (!) 103/58   Pulse 63   Ht 5\' 1"  (1.549 m)   Wt 175 lb 6.4 oz (79.6 kg)   SpO2 98%   BMI 33.14 kg/m  BP Readings from Last 3 Encounters:  12/15/23 (!) 103/58  12/06/23 (!) 141/74  09/10/23 132/72   Physical Exam Vitals reviewed.  Constitutional:      General: She is not in acute distress.    Appearance: Normal appearance. She is obese. She is not toxic-appearing.  HENT:     Head: Normocephalic and atraumatic.     Right Ear: External ear normal.     Left Ear: External ear normal.     Nose: Nose normal. No congestion or rhinorrhea.     Mouth/Throat:     Mouth: Mucous membranes are moist.     Pharynx: Oropharynx is clear. No oropharyngeal exudate or posterior oropharyngeal erythema.  Eyes:     General: No scleral icterus.    Extraocular Movements: Extraocular movements intact.     Conjunctiva/sclera: Conjunctivae normal.     Pupils: Pupils are equal, round, and reactive to light.  Cardiovascular:     Rate and Rhythm: Normal rate and regular rhythm.     Pulses: Normal pulses.     Heart sounds: Normal heart sounds. No murmur heard.    No friction rub. No gallop.  Pulmonary:     Effort: Pulmonary effort is normal.     Breath sounds: Normal breath sounds. No wheezing, rhonchi or rales.  Abdominal:     General: Abdomen is flat. Bowel sounds are normal. There is no distension.     Palpations: Abdomen is soft.     Tenderness: There is no abdominal tenderness.  Musculoskeletal:        General: No swelling. Normal  range of motion.     Cervical back: Normal range of motion.     Right lower leg: No edema.     Left lower leg: No edema.  Lymphadenopathy:     Cervical: No cervical adenopathy.  Skin:    General: Skin is warm and dry.     Capillary Refill: Capillary refill takes less than 2 seconds.     Coloration: Skin is not jaundiced.  Neurological:     General: No focal deficit present.     Mental Status: She is alert and oriented to person, place, and time.     Gait: Gait abnormal (ambulates with a cane).  Psychiatric:        Mood and Affect: Mood normal.        Behavior: Behavior normal.   Last CBC Lab Results  Component Value Date   WBC 8.9 12/06/2023   HGB 13.9 12/06/2023   HCT 42.1 12/06/2023   MCV 86.8 12/06/2023   MCH 28.7 12/06/2023   RDW 12.8 12/06/2023   PLT 325 12/06/2023   Last metabolic panel Lab Results  Component Value Date   GLUCOSE 109 (H) 12/06/2023   NA 137 12/06/2023   K 4.1 12/06/2023   CL 103 12/06/2023   CO2 24 12/06/2023   BUN 14 12/06/2023   CREATININE 0.93 12/06/2023   CREATININE 0.92 12/06/2023   GFRNONAA >60 12/06/2023   GFRNONAA >60 12/06/2023   CALCIUM  9.3 12/06/2023   PROT 7.1 12/05/2023   ALBUMIN 3.7 12/05/2023   LABGLOB 2.9 06/09/2023   AGRATIO 1.6 11/17/2022   BILITOT 0.8 12/05/2023   ALKPHOS 72 12/05/2023   AST 19 12/05/2023   ALT 21 12/05/2023  ANIONGAP 10 12/06/2023   Last lipids Lab Results  Component Value Date   CHOL 199 06/09/2023   HDL 46 06/09/2023   LDLCALC 134 (H) 06/09/2023   TRIG 107 06/09/2023   CHOLHDL 4.3 06/09/2023   Last hemoglobin A1c Lab Results  Component Value Date   HGBA1C 5.8 (H) 06/09/2023   Last thyroid  functions Lab Results  Component Value Date   TSH 1.710 06/09/2023   T3TOTAL 138.6 02/08/2014   Last vitamin D  Lab Results  Component Value Date   VD25OH 25.1 (L) 06/09/2023   Last vitamin B12 and Folate Lab Results  Component Value Date   VITAMINB12 431 06/09/2023   FOLATE 3.3 06/09/2023    The 10-year ASCVD risk score (Arnett DK, et al., 2019) is: 2.7%    Assessment & Plan:   Problem List Items Addressed This Visit       Essential hypertension   Remains adequately controlled with amlodipine  10 mg daily, losartan -HCTZ 100-25 mg daily, and spironolactone  50 mg daily.  No medication changes are indicated today.      Hyperlipidemia - Primary   She is currently prescribed rosuvastatin  10 mg daily.  Lipid panel last updated in November 2024.  Total cholesterol 199 and LDL 134.  Repeat lipid panel ordered today.      Urge incontinence   Symptoms have improved.  She was most recently prescribed oxybutynin  but is not taking it currently.  No indication to resume treatment today.      Vasomotor symptoms due to menopause   Symptoms are stable.  We previously attempted to resume Ve[place]  but it is not covered by insurance.  She is not interested in trying other medications at this time.      Chest pain of unknown etiology   Recent overnight hospital admission 5/10 - 5/11 after presenting with chest pain.  Fortunately, cardiac workup was reassuring.  No acute findings on CTA chest.  Evaluated by cardiology and will undergo an NST and echocardiogram next month.  Cardiology follow-up is scheduled for 6/10.  She has started taking ASA 81 mg.  She endorses chest pain intermittently with exertion.  Denies chest pain currently.       Return in about 6 months (around 06/16/2024).    Tobi Fortes, MD

## 2023-12-15 NOTE — Assessment & Plan Note (Signed)
 She is currently prescribed rosuvastatin  10 mg daily.  Lipid panel last updated in November 2024.  Total cholesterol 199 and LDL 134.  Repeat lipid panel ordered today.

## 2023-12-15 NOTE — Assessment & Plan Note (Signed)
 Remains adequately controlled with amlodipine  10 mg daily, losartan -HCTZ 100-25 mg daily, and spironolactone  50 mg daily.  No medication changes are indicated today.

## 2023-12-16 ENCOUNTER — Ambulatory Visit: Payer: Self-pay | Admitting: Internal Medicine

## 2023-12-16 ENCOUNTER — Other Ambulatory Visit: Payer: Self-pay | Admitting: Internal Medicine

## 2023-12-16 DIAGNOSIS — E782 Mixed hyperlipidemia: Secondary | ICD-10-CM

## 2023-12-16 LAB — LIPID PANEL
Chol/HDL Ratio: 4.8 ratio — ABNORMAL HIGH (ref 0.0–4.4)
Cholesterol, Total: 192 mg/dL (ref 100–199)
HDL: 40 mg/dL (ref >39)
LDL Chol Calc (NIH): 132 mg/dL — ABNORMAL HIGH (ref 0–99)
Triglycerides: 110 mg/dL (ref 0–149)
VLDL Cholesterol Cal: 20 mg/dL (ref 5–40)

## 2023-12-16 MED ORDER — ROSUVASTATIN CALCIUM 20 MG PO TABS
20.0000 mg | ORAL_TABLET | Freq: Every day | ORAL | 3 refills | Status: AC
Start: 2023-12-16 — End: ?

## 2023-12-23 ENCOUNTER — Other Ambulatory Visit: Payer: Self-pay | Admitting: Internal Medicine

## 2023-12-23 DIAGNOSIS — I10 Essential (primary) hypertension: Secondary | ICD-10-CM

## 2024-01-05 ENCOUNTER — Other Ambulatory Visit: Payer: Self-pay | Admitting: Internal Medicine

## 2024-01-05 ENCOUNTER — Ambulatory Visit: Admitting: Nurse Practitioner

## 2024-01-05 DIAGNOSIS — I10 Essential (primary) hypertension: Secondary | ICD-10-CM

## 2024-01-07 ENCOUNTER — Telehealth (HOSPITAL_COMMUNITY): Payer: Self-pay | Admitting: Surgery

## 2024-01-07 NOTE — Telephone Encounter (Signed)
 I called the pt to remind her of her stress test tomorrow, with her arrival time being 8:15 since she has an echo at 8:30.  I left a VM with the following instructions:  nothing to eat or drink 6 prior to the test, no smoking 8 hrs prior to the test, wear comfortable clothes, do not wear any lotions/oils on chest, hold your Losartan -hydrochlorothiazide  the morning of your test, and check in at the front desk on arrival.

## 2024-01-08 ENCOUNTER — Encounter (HOSPITAL_COMMUNITY): Payer: Self-pay

## 2024-01-08 ENCOUNTER — Ambulatory Visit (HOSPITAL_COMMUNITY)
Admission: RE | Admit: 2024-01-08 | Discharge: 2024-01-08 | Disposition: A | Discharge status: Home / Self Care | Source: Ambulatory Visit | Attending: Cardiology | Admitting: Cardiology

## 2024-01-08 ENCOUNTER — Ambulatory Visit (HOSPITAL_COMMUNITY)
Admission: RE | Admit: 2024-01-08 | Discharge: 2024-01-08 | Disposition: A | Discharge status: Home / Self Care | Source: Ambulatory Visit | Attending: Cardiology

## 2024-01-08 ENCOUNTER — Encounter (HOSPITAL_BASED_OUTPATIENT_CLINIC_OR_DEPARTMENT_OTHER)
Admission: RE | Admit: 2024-01-08 | Discharge: 2024-01-08 | Disposition: A | Discharge status: Home / Self Care | Source: Ambulatory Visit | Attending: Cardiology

## 2024-01-08 DIAGNOSIS — R072 Precordial pain: Secondary | ICD-10-CM | POA: Insufficient documentation

## 2024-01-08 LAB — ECHOCARDIOGRAM COMPLETE
AR max vel: 1.51 cm2
AV Area VTI: 1.55 cm2
AV Area mean vel: 1.49 cm2
AV Mean grad: 5 mmHg
AV Peak grad: 11.3 mmHg
Ao pk vel: 1.68 m/s
Area-P 1/2: 2.73 cm2
Calc EF: 46.9 %
S' Lateral: 3 cm
Single Plane A2C EF: 47.6 %
Single Plane A4C EF: 43.3 %

## 2024-01-08 MED ORDER — REGADENOSON 0.4 MG/5ML IV SOLN
INTRAVENOUS | Status: AC
  Administered 2024-01-08: 0.4 mg via INTRAVENOUS
  Filled 2024-01-08: qty 5

## 2024-01-08 MED ORDER — TECHNETIUM TC 99M TETROFOSMIN IV KIT
10.0000 | PACK | Freq: Once | INTRAVENOUS | Status: AC | PRN
  Administered 2024-01-08: 11 via INTRAVENOUS

## 2024-01-08 MED ORDER — SODIUM CHLORIDE FLUSH 0.9 % IV SOLN
INTRAVENOUS | Status: AC
  Administered 2024-01-08: 10 mL via INTRAVENOUS
  Filled 2024-01-08: qty 10

## 2024-01-08 MED ORDER — TECHNETIUM TC 99M TETROFOSMIN IV KIT
30.0000 | PACK | Freq: Once | INTRAVENOUS | Status: AC | PRN
  Administered 2024-01-08: 32 via INTRAVENOUS

## 2024-01-08 NOTE — Progress Notes (Signed)
*  PRELIMINARY RESULTS* Echocardiogram 2D Echocardiogram has been performed.  [name] 01/08/2024, 10:01 AM

## 2024-01-10 ENCOUNTER — Ambulatory Visit: Payer: Self-pay | Admitting: Cardiology

## 2024-01-12 ENCOUNTER — Other Ambulatory Visit: Payer: Self-pay | Admitting: Internal Medicine

## 2024-01-12 DIAGNOSIS — G8929 Other chronic pain: Secondary | ICD-10-CM
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
## 2024-02-02 ENCOUNTER — Ambulatory Visit: Attending: Cardiology | Admitting: Cardiology

## 2024-02-02 ENCOUNTER — Encounter: Payer: Self-pay | Admitting: Cardiology

## 2024-02-02 VITALS — BP 162/96 | HR 63 | Ht 61.0 in | Wt 177.6 lb

## 2024-02-02 DIAGNOSIS — I10 Essential (primary) hypertension: Secondary | ICD-10-CM

## 2024-02-02 DIAGNOSIS — E785 Hyperlipidemia, unspecified: Secondary | ICD-10-CM | POA: Diagnosis not present

## 2024-02-02 DIAGNOSIS — R072 Precordial pain: Secondary | ICD-10-CM | POA: Diagnosis not present

## 2024-02-02 DIAGNOSIS — G4733 Obstructive sleep apnea (adult) (pediatric): Secondary | ICD-10-CM | POA: Diagnosis not present

## 2024-02-02 MED ORDER — CARVEDILOL 6.25 MG PO TABS: 6.2500 mg | ORAL_TABLET | Freq: Two times a day (BID) | ORAL | 3 refills | Status: AC

## 2024-02-02 NOTE — Patient Instructions (Addendum)
 Medication Instructions:  Continue current medications Start Coreg  6.25 twice a day *If you need a refill on your cardiac medications before your next appointment, please call your pharmacy*  Lab Work: Lipid panel, mg, bmet today If you have labs (blood work) drawn today and your tests are completely normal, you will receive your results only by: MyChart Message (if you have MyChart) OR A paper copy in the mail If you have any lab test that is abnormal or we need to change your treatment, we will call you to review the results.  Testing/Procedures:    Please report to Radiology at the [facility] Main Entrance 30 minutes early for your test.  [address] [place], [place] [address]             How to Prepare for Your Cardiac PET/CT Stress Test:  Nothing to eat or drink, except water, 3 hours prior to arrival time.  NO caffeine/decaffeinated products, or chocolate 12 hours prior to arrival. (Please note decaffeinated beverages (teas/coffees) still contain caffeine).  If you have caffeine within 12 hours prior, the test will need to be rescheduled.   Diabetic Preparation: If able to eat breakfast prior to 3 hour fasting, you may take all medications, including your insulin. Do not worry if you miss your breakfast dose of insulin - start at your next meal. If you do not eat prior to 3 hour fast-Hold all diabetes (oral and insulin) medications. Patients who wear a continuous glucose monitor MUST remove the device prior to scanning.  You may take your remaining medications with water.  NO perfume, cologne or lotion on chest or abdomen area. FEMALES - Please avoid wearing dresses to this appointment.  Total time is 1 to 2 hours; you may want to bring reading material for the waiting time.  IF YOU THINK YOU MAY BE PREGNANT, OR ARE NURSING PLEASE INFORM THE TECHNOLOGIST.  In preparation for your appointment, medication and supplies will be purchased.  Appointment  availability is limited, so if you need to cancel or reschedule, please call the Radiology Department Scheduler at [phone] 24 hours in advance to avoid a cancellation fee of $100.00  What to Expect When you Arrive:  Once you arrive and check in for your appointment, you will be taken to a preparation room within the Radiology Department.  A technologist or Nurse will obtain your medical history, verify that you are correctly prepped for the exam, and explain the procedure.  Afterwards, an IV will be started in your arm and electrodes will be placed on your skin for EKG monitoring during the stress portion of the exam. Then you will be escorted to the PET/CT scanner.  There, staff will get you positioned on the scanner and obtain a blood pressure and EKG.  During the exam, you will continue to be connected to the EKG and blood pressure machines.  A small, safe amount of a radioactive tracer will be injected in your IV to obtain a series of pictures of your heart along with an injection of a stress agent.    After your Exam:  It is recommended that you eat a meal and drink a caffeinated beverage to counter act any effects of the stress agent.  Drink plenty of fluids for the remainder of the day and urinate frequently for the first couple of hours after the exam.  Your doctor will inform you of your test results within 7-10 business days.  For more information and frequently  asked questions, please visit our website: [email or web address]  For questions about your test or how to prepare for your test, please call: Cardiac Imaging Nurse Navigators Office: [phone]   Follow-Up: At [facility], you and your health needs are our priority.  As part of our continuing mission to provide you with exceptional heart care, our providers are all part of one team.  This team includes your primary Cardiologist (physician) and Advanced Practice Providers or APPs (Physician Assistants  and Nurse Practitioners) who all work together to provide you with the care you need, when you need it.  Your next appointment:   4 month(s)  Provider:   Dr. Kate  We recommend signing up for the patient portal called MyChart.  Sign up information is provided on this After Visit Summary.  MyChart is used to connect with patients for Virtual Visits (Telemedicine).  Patients are able to view lab/test results, encounter notes, upcoming appointments, etc.  Non-urgent messages can be sent to your provider as well.   To learn more about what you can do with MyChart, go to [email or web address].   Other Instructions Referrral to [facility]   Please check blood pressure and heart rate once daily for next 2 week  and send those readings

## 2024-02-03 DIAGNOSIS — E785 Hyperlipidemia, unspecified: Secondary | ICD-10-CM | POA: Diagnosis not present

## 2024-02-03 DIAGNOSIS — I10 Essential (primary) hypertension: Secondary | ICD-10-CM | POA: Diagnosis not present

## 2024-02-03 NOTE — Addendum Note (Signed)
 Addended by: Giulianna Rocha C on: 02/03/2024 09:29 AM   Modules accepted: Orders

## 2024-02-04 ENCOUNTER — Ambulatory Visit: Payer: Self-pay | Admitting: Cardiology

## 2024-02-04 LAB — BASIC METABOLIC PANEL WITH GFR
BUN/Creatinine Ratio: 24 — ABNORMAL HIGH (ref 9–23)
BUN: 23 mg/dL (ref 6–24)
CO2: 20 mmol/L (ref 20–29)
Calcium: 9.4 mg/dL (ref 8.7–10.2)
Chloride: 100 mmol/L (ref 96–106)
Creatinine, Ser: 0.95 mg/dL (ref 0.57–1.00)
Glucose: 100 mg/dL — ABNORMAL HIGH (ref 70–99)
Potassium: 4.4 mmol/L (ref 3.5–5.2)
Sodium: 135 mmol/L (ref 134–144)
eGFR: 69 mL/min/1.73 (ref >59)

## 2024-02-04 LAB — LIPID PANEL
Chol/HDL Ratio: 2.9 ratio (ref 0.0–4.4)
Cholesterol, Total: 130 mg/dL (ref 100–199)
HDL: 45 mg/dL (ref >39)
LDL Chol Calc (NIH): 64 mg/dL (ref 0–99)
Triglycerides: 118 mg/dL (ref 0–149)
VLDL Cholesterol Cal: 21 mg/dL (ref 5–40)

## 2024-02-04 LAB — MAGNESIUM: Magnesium: 2.1 mg/dL (ref 1.6–2.3)

## 2024-02-18 ENCOUNTER — Other Ambulatory Visit: Payer: Self-pay | Admitting: Internal Medicine

## 2024-02-18 DIAGNOSIS — G8929 Other chronic pain: Secondary | ICD-10-CM

## 2024-02-22 ENCOUNTER — Emergency Department (HOSPITAL_COMMUNITY)
Admission: EM | Admit: 2024-02-22 | Discharge: 2024-02-22 | Disposition: A | Discharge status: Home / Self Care | Attending: Emergency Medicine | Admitting: Emergency Medicine

## 2024-02-22 ENCOUNTER — Other Ambulatory Visit: Payer: Self-pay

## 2024-02-22 ENCOUNTER — Emergency Department (HOSPITAL_COMMUNITY)

## 2024-02-22 DIAGNOSIS — S66819A Strain of other specified muscles, fascia and tendons at wrist and hand level, unspecified hand, initial encounter: Secondary | ICD-10-CM

## 2024-02-22 DIAGNOSIS — I10 Essential (primary) hypertension: Secondary | ICD-10-CM | POA: Insufficient documentation

## 2024-02-22 DIAGNOSIS — I2510 Atherosclerotic heart disease of native coronary artery without angina pectoris: Secondary | ICD-10-CM | POA: Diagnosis not present

## 2024-02-22 DIAGNOSIS — M79641 Pain in right hand: Secondary | ICD-10-CM | POA: Diagnosis not present

## 2024-02-22 DIAGNOSIS — J4489 Other specified chronic obstructive pulmonary disease: Secondary | ICD-10-CM | POA: Insufficient documentation

## 2024-02-22 DIAGNOSIS — Z87891 Personal history of nicotine dependence: Secondary | ICD-10-CM | POA: Diagnosis not present

## 2024-02-22 DIAGNOSIS — S6991XA Unspecified injury of right wrist, hand and finger(s), initial encounter: Secondary | ICD-10-CM | POA: Diagnosis present

## 2024-02-22 DIAGNOSIS — M66241 Spontaneous rupture of extensor tendons, right hand: Secondary | ICD-10-CM | POA: Diagnosis not present

## 2024-02-22 DIAGNOSIS — Z79899 Other long term (current) drug therapy: Secondary | ICD-10-CM | POA: Diagnosis not present

## 2024-02-22 DIAGNOSIS — Z8541 Personal history of malignant neoplasm of cervix uteri: Secondary | ICD-10-CM | POA: Insufficient documentation

## 2024-02-22 DIAGNOSIS — X501XXA Overexertion from prolonged static or awkward postures, initial encounter: Secondary | ICD-10-CM | POA: Diagnosis not present

## 2024-02-22 DIAGNOSIS — S66811A Strain of other specified muscles, fascia and tendons at wrist and hand level, right hand, initial encounter: Secondary | ICD-10-CM | POA: Insufficient documentation

## 2024-02-22 NOTE — ED Provider Notes (Signed)
 [facility] EMERGENCY DEPARTMENT AT [address] [facility] Provider Note  CSN: [id number] Arrival date & time: 02/22/24 9143  Chief Complaint(s) Hand Pain  HPI [name] is a 58 y.o. female with past medical history as below, significant for asthma, bipolar 1, COPD, GERD, [name] who presents to the ED with complaint of finger injury  Patient reports yesterday she was texting on her phone, she felt a snapping or popping sensation in her hand, she is unable to fully extend her right fourth digit.  She able to flex the digit without difficulty.  When she moves her finger it is quite painful at the base of her ring finger.  No significant pain to the wrist, no other injuries. RHD  Past Medical History Past Medical History:  Diagnosis Date   Acute pansinusitis 05/03/2019   Allergy    Anxiety    Arthritis    pretty much all over; mainly neck and back (12/26/2015)   Asthma    Atypical squamous cell changes of undetermined significance (ASCUS) on vaginal cytology 04/11/2022   Bipolar 1 disorder (HCC)    Bipolar disorder (HCC)    Bipolar I disorder, most recent episode depressed (HCC) 07/04/2014   Cervical cancer (HCC)    stage I   Chronic back pain    Chronic bronchitis (HCC)    Colon cancer screening 02/15/2018   COPD (chronic obstructive pulmonary disease) (HCC)    Degenerative disc disease    Depression    Dysrhythmia    Fibromyalgia    GERD (gastroesophageal reflux disease)    Hepatic cyst    High cholesterol    History of hiatal hernia    Hypertension 08/04/2012   IBS (irritable bowel syndrome) Diagnosed November 2012   Incisional hernia with gangrene and obstruction 12/26/2015   Melanoma of back (HCC)    Ovarian cancer (HCC)    stage II   Plantar fasciitis, bilateral    Preventative health care 04/11/2022   Sleep apnea    cpap coming  soon (12/26/2015)   Patient Active Problem List   Diagnosis Date Noted   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   Chest  pain of unknown etiology 12/05/2023   Vasomotor symptoms due to menopause 06/09/2023   Acute pain of left knee 06/09/2023   Injury of toenail of left foot 03/06/2023   Arthralgia 02/11/2023   Pruritus 11/18/2022   Acute lumbar back pain 10/07/2022   Medial epicondylitis of elbow, right 09/02/2022   Urge incontinence 09/02/2022   Borderline personality disorder (HCC) 05/29/2022   Cannabis use disorder 05/29/2022   Major depressive disorder, recurrent episode, moderate (HCC) 05/29/2022   Right flank pain 05/07/2022   Hyperlipidemia 04/11/2022   BMI 33.0-33.9,adult 04/11/2022   Tenosynovitis of thumb 01/12/2020   Mixed simple and mucopurulent chronic bronchitis (HCC) 12/02/2019   Lichen sclerosus et atrophicus of the vulva 12/01/2019   Chronic pain syndrome 08/29/2019   Lumbar facet arthropathy (L4/5 and L5/S1) 08/29/2019   Non-recurrent acute serous otitis media of left ear 08/04/2019   Chronic pain of both shoulders 06/02/2019   Primary osteoarthritis of left shoulder 06/02/2019   H/O repair of left rotator cuff 06/02/2019   Vitamin D  deficiency 04/20/2019   Lipoma of torso 09/07/2018   Prediabetes 10/01/2017   Coronary artery disease 10/01/2017   Hepatic cyst 08/18/2017   Fibromyalgia 04/16/2017   Sacroiliac joint pain 04/16/2017   Incisional hernia with gangrene and obstruction 12/26/2015   Incisional hernia with obstruction 12/26/2015   OSA (obstructive sleep  apnea) 12/13/2015   Irritable bowel syndrome 09/25/2015   Risk for falls 09/25/2015   Hypertensive urgency 03/19/2015   Unsteady gait 03/19/2015   PTSD (post-traumatic stress disorder) 01/31/2014   Generalized anxiety disorder with panic attacks 01/31/2014   Chronic right-sided low back pain with right-sided sciatica 08/18/2013   DDD (degenerative disc disease), cervical 08/18/2013   DDD (degenerative disc disease), lumbosacral 08/18/2013   Myalgia, multiple sites 08/18/2013   Osteoarthritis 09/14/2012   Asthma  09/14/2012   Chronic obstructive pulmonary disease (HCC) 09/14/2012   Gastro-esophageal reflux disease without esophagitis 09/14/2012   Essential hypertension 09/14/2012   Tobacco use 09/14/2012   Vaping nicotine dependence, non-tobacco product 09/14/2012   Insomnia due to mental disorder 05/28/2012   Home Medication(s) Prior to Admission medications   Medication Sig Start Date End Date Taking? Authorizing Provider  albuterol  (VENTOLIN  HFA) 108 (90 Base) MCG/ACT inhaler Inhale 2 puffs into the lungs every 6 (six) hours as needed for wheezing or shortness of breath. 11/04/22   Melvenia Manus BRAVO, MD  amLODipine  (NORVASC ) 10 MG tablet TAKE 1 TABLET(10 MG) BY MOUTH DAILY 12/23/23   Melvenia Manus BRAVO, MD  carvedilol  (COREG ) 6.25 MG tablet Take 1 tablet (6.25 mg total) by mouth 2 (two) times daily. 02/02/24   Kate Lonni CROME, MD  clobetasol  ointment (TEMOVATE ) 0.05 % Apply to affected area every night for 4 weeks, then every other day for 4 weeks and then twice a week for 4 weeks or until resolution. Patient taking differently: Apply 1 Application topically daily as needed. Apply to affected area every night for 4 weeks, then every other day for 4 weeks and then twice a week for 4 weeks or until resolution. 06/23/22   Constant, Peggy, MD  EPINEPHrine  0.3 mg/0.3 mL IJ SOAJ injection Inject 0.3 mg into the muscle as needed for anaphylaxis. Inject into the muscle. 04/09/22   Melvenia Manus BRAVO, MD  losartan -hydrochlorothiazide  (HYZAAR) 100-25 MG tablet TAKE 1 TABLET BY MOUTH DAILY 12/23/23   Melvenia Manus BRAVO, MD  rosuvastatin  (CRESTOR ) 20 MG tablet Take 1 tablet (20 mg total) by mouth daily. 12/16/23   Melvenia Manus BRAVO, MD  tiZANidine  (ZANAFLEX ) 4 MG tablet TAKE 1 TABLET(4 MG) BY MOUTH EVERY 6 HOURS FOR UP TO 60 DOSES AS NEEDED FOR MUSCLE SPASMS 02/18/24   Bevely Doffing, FNP                                                                                                                                    Past  Surgical History Past Surgical History:  Procedure Laterality Date   ABDOMINAL HYSTERECTOMY  1997   APPENDECTOMY     CARDIAC CATHETERIZATION  2015   CARPAL TUNNEL RELEASE Right    CESAREAN SECTION  1987   CHOLECYSTECTOMY OPEN  1998   COLONOSCOPY  November 2012   EYE SURGERY     CATARACTS REMOVED 09/09/17 and 09/16/17   HERNIA REPAIR  INCISIONAL HERNIA REPAIR N/A 12/26/2015   Procedure: LAPAROSCOPIC REPAIR INCISIONAL HERNIA WITH MESH;  Surgeon: Elon Pacini, MD;  Location[facility] OR;  Service: General;  Laterality: N/A;   INSERTION OF MESH N/A 12/26/2015   Procedure: INSERTION OF MESH;  Surgeon: Elon Pacini, MD;  Location: [facility] OR;  Service: General;  Laterality: N/A;   LAPAROSCOPIC ABDOMINAL EXPLORATION  1997   LAPAROSCOPIC INCISIONAL / UMBILICAL / VENTRAL HERNIA REPAIR  12/26/2015   repair of incarcerated incisional hernia with mesh   LIVER CYST REMOVAL  2014   MELANOMA EXCISION  January 2013   removed from back   SHOULDER SURGERY Left 2010   Humeral Head Microfracture    TUBAL LIGATION     UPPER GASTROINTESTINAL ENDOSCOPY  November 2012   Family History Family History  Adopted: Yes  Problem Relation Age of Onset   Bipolar disorder Mother        never diagnosed but patient suspects mother had bipolar disorder.SABRASABRASABRASABRApt was adopeted, only knew birth mother for a short period of time.   Anxiety disorder Mother    Cirrhosis Mother    Diabetes Mother    Turner syndrome Daughter    Cancer Daughter    Anxiety disorder Daughter    Bipolar disorder Daughter    Interstitial cystitis Daughter    ADD / ADHD Neg Hx    Alcohol abuse Neg Hx    Drug abuse Neg Hx    Dementia Neg Hx    Depression Neg Hx    OCD Neg Hx    Paranoid behavior Neg Hx    Schizophrenia Neg Hx    Seizures Neg Hx    Sexual abuse Neg Hx    Physical abuse Neg Hx    Colon cancer Neg Hx     Social History Social History   Tobacco Use   Smoking status: Former    Types: E-cigarettes   Smokeless tobacco: Never    Tobacco comments:    Smokes hemp every night to help with her pain  Vaping Use   Vaping status: Every Day   Start date: 04/18/2019   Substances: Nicotine-salt  Substance Use Topics   Alcohol use: No    Comment: 12/26/2015 'quit in ~ 1997   Drug use: Yes    Types: Other-see comments, Marijuana    Comment: 12/26/2015 quit in ~  2010.   smokes CBD with 0.3% THC nightly   Allergies Barium sulfate, Barium-containing compounds, Bee venom, Haemophilus influenzae, Influenza virus vaccine, Seldane [terfenadine], Sulfa antibiotics, Amitriptyline, Lisinopril, Lithium , Tetracyclines & related, Celebrex  [celecoxib ], Dexamethasone , Metformin  and related, Prednisone, Trazodone  and nefazodone, Zinc, Ativan  [lorazepam ], Erythromycin, and Lipitor [atorvastatin]  Review of Systems A thorough review of systems was obtained and all systems are negative except as noted in the HPI and PMH.   Physical Exam Vital Signs  I have reviewed the triage vital signs BP 114/70   Pulse 67   Temp 97.8 F (36.6 C)   Resp 17   SpO2 99%  Physical Exam Vitals and nursing note reviewed.  Constitutional:      General: She is not in acute distress.    Appearance: Normal appearance. She is well-developed. She is not ill-appearing.  HENT:     Head: Normocephalic and atraumatic.     Right Ear: External ear normal.     Left Ear: External ear normal.     Nose: Nose normal.     Mouth/Throat:     Mouth: Mucous membranes are moist.  Eyes:  General: No scleral icterus.       Right eye: No discharge.        Left eye: No discharge.  Cardiovascular:     Rate and Rhythm: Normal rate.  Pulmonary:     Effort: Pulmonary effort is normal. No respiratory distress.     Breath sounds: No stridor.  Abdominal:     General: Abdomen is flat. There is no distension.     Tenderness: There is no guarding.  Musculoskeletal:        General: No deformity.       Hands:     Cervical back: No rigidity.     Comments: Minimal  extensor strength flexion is intact to fourth digit right hand.  Hand otherwise NVI  Skin:    General: Skin is warm and dry.     Coloration: Skin is not cyanotic, jaundiced or pale.  Neurological:     Mental Status: She is alert and oriented to person, place, and time.     GCS: GCS eye subscore is 4. GCS verbal subscore is 5. GCS motor subscore is 6.  Psychiatric:        Speech: Speech normal.        Behavior: Behavior normal. Behavior is cooperative.     ED Results and Treatments Labs (all labs ordered are listed, but only abnormal results are displayed) Labs Reviewed - No data to display                                                                                                                        Radiology DG Hand Complete Right Result Date: 02/22/2024 CLINICAL DATA:  Right hand pain after feeling a popping sensation while holding her phone. EXAM: RIGHT HAND - COMPLETE 3+ VIEW COMPARISON:  None Available. FINDINGS: There is no evidence of fracture or dislocation. There is no evidence of arthropathy or other focal bone abnormality. Soft tissues are unremarkable. IMPRESSION: Negative. Electronically Signed   By: Elspeth Bathe M.D.   On: 02/22/2024 10:27    Pertinent labs & imaging results that were available during my care of the patient were reviewed by me and considered in my medical decision making (see MDM for details).  Medications Ordered in ED Medications - No data to display  Procedures Procedures  (including critical care time)  Medical Decision Making / ED Course    Medical Decision Making:    [name] is a 58 y.o. female with past medical history as below, significant for asthma, bipolar 1, COPD, GERD, [name] who presents to the ED with complaint of finger injury. The complaint involves an extensive differential diagnosis and  also carries with it a high risk of complications and morbidity.  Serious etiology was considered. Ddx includes but is not limited to: Ligamentous injury, tendon injury, MSK injury, muscle spasm, etc.  Complete initial physical exam performed, notably the patient was in stress, sitting comfortably.    Reviewed and confirmed nursing documentation for past medical history, family history, social history.  Vital signs reviewed.    Extensor tendon injury/rupture> - Unable to extend right fourth digit, flexion is intact.  NVI otherwise.  Concern for extensor digitorum injury, will discuss with hand surgery - XR stable - Spoke with Ortho Dr Onesimo, recommend splint with fingers extension, follow-up in the office - Her pain is very mild, advised to take Motrin /Tylenol  as needed at home. - Strict return precautions provided   Patient in no distress and overall condition is stable. Detailed discussions were had with the patient/guardian regarding current findings, and need for close f/u with PCP or on call doctor. The patient/guardian has been instructed to return immediately if the symptoms worsen in any way for re-evaluation. Patient/guardian verbalized understanding and is in agreement with current care plan. All questions answered prior to discharge.                         Additional history obtained: -Additional history obtained from na -External records from outside source obtained and reviewed including: Chart review including previous notes, labs, imaging, consultation notes including  Allergy list Home meds   Lab Tests: na  EKG   EKG Interpretation Date/Time:    Ventricular Rate:    PR Interval:    QRS Duration:    QT Interval:    QTC Calculation:   R Axis:      Text Interpretation:           Imaging Studies ordered: I ordered imaging studies including hand xr I independently visualized the following imaging with scope of interpretation limited to  determining acute life threatening conditions related to emergency care; findings noted above I agree with the radiologist interpretation If any imaging was obtained with contrast I closely monitored patient for any possible adverse reaction a/w contrast administration in the emergency department   Medicines ordered and prescription drug management: No orders of the defined types were placed in this encounter.   -I have reviewed the patients home medicines and have made adjustments as needed   Consultations Obtained: I requested consultation with the ortho,  and discussed lab and imaging findings as well as pertinent plan - they recommend: office f/u   Cardiac Monitoring: Continuous pulse oximetry interpreted by myself, 99% on ra.    Social Determinants of Health:  Diagnosis or treatment significantly limited by social determinants of health: former smoker   Reevaluation: After the interventions noted above, I reevaluated the patient and found that they have stayed the same  Co morbidities that complicate the patient evaluation  Past Medical History:  Diagnosis Date   Acute pansinusitis 05/03/2019   Allergy    Anxiety    Arthritis    pretty much all over; mainly neck and back (12/26/2015)  Asthma    Atypical squamous cell changes of undetermined significance (ASCUS) on vaginal cytology 04/11/2022   Bipolar 1 disorder (HCC)    Bipolar disorder (HCC)    Bipolar I disorder, most recent episode depressed (HCC) 07/04/2014   Cervical cancer (HCC)    stage I   Chronic back pain    Chronic bronchitis (HCC)    Colon cancer screening 02/15/2018   COPD (chronic obstructive pulmonary disease) (HCC)    Degenerative disc disease    Depression    Dysrhythmia    Fibromyalgia    GERD (gastroesophageal reflux disease)    Hepatic cyst    High cholesterol    History of hiatal hernia    Hypertension 08/04/2012   IBS (irritable bowel syndrome) Diagnosed November 2012    Incisional hernia with gangrene and obstruction 12/26/2015   Melanoma of back (HCC)    Ovarian cancer (HCC)    stage II   Plantar fasciitis, bilateral    Preventative health care 04/11/2022   Sleep apnea    cpap coming  soon (12/26/2015)      Dispostion: Disposition decision including need for hospitalization was considered, and patient discharged from emergency department.    Final Clinical Impression(s) / ED Diagnoses Final diagnoses:  Rupture of extensor digitorum tendon        Elnor Jayson LABOR, DO 02/22/24 1233

## 2024-02-22 NOTE — ED Triage Notes (Addendum)
 Pt arrived POV. Complains of right hand pain. States that she felt something pop while holding her phone.

## 2024-02-22 NOTE — Discharge Instructions (Signed)
 It was a pleasure caring for you today in the emergency department.  There is likely ruptured at least injured a tendon in your hand.  Expect to receive a phone call from orthopedics for follow-up, you not hear from them this week please call number on paperwork to arrange follow-up  Please return to the emergency department for any worsening or worrisome symptoms.

## 2024-02-26 ENCOUNTER — Encounter: Payer: Self-pay | Admitting: Orthopedic Surgery

## 2024-02-26 ENCOUNTER — Ambulatory Visit (INDEPENDENT_AMBULATORY_CARE_PROVIDER_SITE_OTHER): Admitting: Orthopedic Surgery

## 2024-02-26 VITALS — BP 98/55 | Wt 179.9 lb

## 2024-02-26 DIAGNOSIS — M65341 Trigger finger, right ring finger: Secondary | ICD-10-CM | POA: Diagnosis not present

## 2024-02-26 MED ORDER — METHYLPREDNISOLONE ACETATE 40 MG/ML IJ SUSP
40.0000 mg | Freq: Once | INTRAMUSCULAR | Status: AC
  Administered 2024-02-26: 40 mg via INTRA_ARTICULAR

## 2024-02-26 NOTE — Patient Instructions (Signed)

## 2024-02-26 NOTE — Progress Notes (Signed)
 Return patient Visit  Assessment: [name] is a 58 y.o. female with the following: 1. Trigger finger, right ring finger  Plan: Symptoms and physical exam most consistent with trigger finger, acute onset.  We discussed etiology, and potential treatment options.  Surgery was discussed in detail, including the plan for procedure, and expected recovery.  After discussing these options, the patient would like to proceed with a steroid injection.  This was completed in clinic today.  Depending on the efficacy of the injection, we could consider another injection.  However, if the injection does not provide sustained relief, I would recommend surgery.  All of this was discussed with the patient, and they are amenable to this plan.  Follow-up as needed.   Procedure note injection - Right Ring finger A1 Pulley  Verbal consent was obtained to inject the Right Ring finger A1 pulley Timeout was completed to confirm the site of injection.  The skin was prepped with alcohol and ethyl chloride was sprayed at the injection site.  A 21-gauge needle was used to inject 40 mg of Depo-Medrol  and 1% lidocaine  (1 cc) into the Right Ring finger using a direct anterior approach.  There were no complications. Patient tolerated the procedure well. A sterile bandage was applied     Follow-up: Return if symptoms worsen or fail to improve.  Subjective:  Chief Complaint  Patient presents with   Hand Pain    Right hand pain went to ED 07/28- still sore. Has knot at ring knuckle    History of Present Illness: [name] is a 58 y.o. female who presents for evaluation of Right Ring finger pain.  She was using her phone a couple of days ago, when she supinated her wrist and set her phone down.  She had immediate pain in the area of the ring finger MCP joint.  The pain was severe enough that she presented to the emergency department.  In the ED, they are concerned about a tendon rupture.  No history of  rheumatoid arthritis.  She was placed in a splint, which caused skin irritation.  She remove the splint, and instead fit herself with a brace.  She continues to have pain, primarily in the volar aspect of the ring finger.  She feels as though there is swelling in this area.  She is able to flex and extend her fingers.  She does note occasional catching sensations in the ring finger.   Review of Systems: No fevers or chills No numbness or tingling No chest pain No shortness of breath No bowel or bladder dysfunction No GI distress No headaches    Objective: BP (!) 98/55   Wt 179 lb 14.4 oz (81.6 kg)   BMI 33.99 kg/m   Physical Exam:  General: Alert and oriented. and No acute distress. Gait: Normal gait.  Evaluation of the right hand demonstrates mild swelling of the volar aspect of the ring finger.  There is tenderness to palpation over the A1 pulley.  She can fully extend her finger.  She is able to make a fist.  However, she does have some pain.  No triggering is appreciated in clinic today.  IMAGING: No new imaging obtained today  X-rays were available in clinic today, from her emergency room visit.  These are negative for acute injury. New Medications:  No orders of the defined types were placed in this encounter.     Oneil DELENA Horde, MD  02/26/2024 11:53 AM

## 2024-02-26 NOTE — Addendum Note (Signed)
 Addended by: MARCINE HUSBAND T on: 02/26/2024 12:06 PM   Modules accepted: Orders

## 2024-03-08 ENCOUNTER — Telehealth: Payer: Self-pay | Admitting: Pulmonary Disease

## 2024-03-08 NOTE — Telephone Encounter (Signed)
 PT saw Dr. Alva in [place] a year ago. I called her today because there is a referral in for OSA from her Dr.   She said that she already had a HST and we were going to see about finding an insurance compnay that would take her ins. For a CPAP, and deliver/mail a CPAP to her because she does not drive. I see the last encounter was closed but never resolved. Please call Pt to advise.

## 2024-03-09 ENCOUNTER — Ambulatory Visit: Admitting: Student

## 2024-03-09 NOTE — Telephone Encounter (Signed)
 Since its been over a year since she has been seen. Do you think a DME will cover this? Im not so sure she wont require an OV. Please advise

## 2024-03-10 NOTE — Telephone Encounter (Signed)
 Okay set up with ov for evaluation

## 2024-03-10 NOTE — Telephone Encounter (Signed)
 I do not think that insurance will cover since LOV was >1.65yr. Glad to send order but usually if not seen and study >1 yr will need repeat OV with study

## 2024-03-10 NOTE — Telephone Encounter (Signed)
 PCC's- are you able to check into this and see what would be needed to get her on her CPAP that we ordered, or let us  know if ov and sleep study both need to be repeated? Thanks!

## 2024-03-11 NOTE — Telephone Encounter (Signed)
 I called and spoke with the pt and notified that visit is needed  She only wants to be seen in [place]  Appt scheduled and nothing further needed

## 2024-03-18 ENCOUNTER — Encounter: Payer: Self-pay | Admitting: Radiology

## 2024-03-22 ENCOUNTER — Other Ambulatory Visit: Payer: Self-pay

## 2024-03-22 DIAGNOSIS — G8929 Other chronic pain: Secondary | ICD-10-CM

## 2024-03-22 MED ORDER — TIZANIDINE HCL 4 MG PO TABS: 4.0000 mg | ORAL_TABLET | Freq: Four times a day (QID) | ORAL | 3 refills | Status: AC | PRN

## 2024-03-22 NOTE — Telephone Encounter (Signed)
 Copied from CRM [id number]. Topic: Clinical - Medication Refill >> Mar 22, 2024 12:37 PM Sophia H wrote: Medication: tiZANidine  (ZANAFLEX ) 4 MG tablet   Has the patient contacted their pharmacy? Yes, pharmacy states faxed over request but no response back. Dr. Melvenia no longer at clinic so needing a new RX from PCP    This is the patient's preferred pharmacy:  [id number] - [place], [place] - [address] AT SEC [address] [place] [address] Phone: [phone] Fax: [phone]  Is this the correct pharmacy for this prescription? Yes If no, delete pharmacy and type the correct one.   Has the prescription been filled recently? Yes  Is the patient out of the medication? Yes  Has the patient been seen for an appointment in the last year OR does the patient have an upcoming appointment? Yes, has appt on Nov. 20th   Can we respond through MyChart? Yes  Agent: Please be advised that Rx refills may take up to 3 business days. We ask that you follow-up with your pharmacy.

## 2024-03-23 ENCOUNTER — Encounter (HOSPITAL_COMMUNITY): Payer: Self-pay

## 2024-03-23 ENCOUNTER — Other Ambulatory Visit (HOSPITAL_COMMUNITY)

## 2024-04-15 ENCOUNTER — Encounter (HOSPITAL_COMMUNITY): Payer: Self-pay

## 2024-04-19 ENCOUNTER — Encounter (HOSPITAL_COMMUNITY)
Admission: RE | Admit: 2024-04-19 | Discharge: 2024-04-19 | Disposition: A | Discharge status: Home / Self Care | Source: Ambulatory Visit | Attending: Cardiology | Admitting: Cardiology

## 2024-04-19 DIAGNOSIS — R072 Precordial pain: Secondary | ICD-10-CM | POA: Insufficient documentation

## 2024-04-19 MED ORDER — REGADENOSON 0.4 MG/5ML IV SOLN
0.4000 mg | Freq: Once | INTRAVENOUS | Status: AC
  Administered 2024-04-19: 0.4 mg via INTRAVENOUS

## 2024-04-19 MED ORDER — RUBIDIUM RB82 GENERATOR (RUBYFILL)
21.0000 | PACK | Freq: Once | INTRAVENOUS | Status: AC
  Administered 2024-04-19: 21 via INTRAVENOUS

## 2024-04-19 MED ORDER — RUBIDIUM RB82 GENERATOR (RUBYFILL)
21.0000 | PACK | Freq: Once | INTRAVENOUS | Status: AC
  Administered 2024-04-19: 21.12 via INTRAVENOUS

## 2024-04-19 MED ORDER — REGADENOSON 0.4 MG/5ML IV SOLN
INTRAVENOUS | Status: AC
  Filled 2024-04-19: qty 5

## 2024-04-19 NOTE — Progress Notes (Signed)
 Pt tolerated lexiscan . Pt c/o heavy pressure on chest, SOB, stomach cramps, and HA. Pressure and SOB resolved by end of scan. Pt declined PO caffeine, stating she has her own.

## 2024-04-20 LAB — NM PET CT CARDIAC PERFUSION MULTI W/ABSOLUTE BLOODFLOW
MBFR: 2.44
Nuc Rest EF: 60 %
Nuc Stress EF: 66 %
Rest MBF: 1.16 ml/g/min
Rest Nuclear Isotope Dose: 21 mCi
ST Depression (mm): 0 mm
Stress MBF: 2.83 ml/g/min
Stress Nuclear Isotope Dose: 21 mCi
TID: 1.04

## 2024-04-21 NOTE — Telephone Encounter (Signed)
 Made patient aware per DR. Schumann normal stress test. Patient verbalized understanding.

## 2024-04-22 ENCOUNTER — Other Ambulatory Visit: Payer: Self-pay | Admitting: Medical Genetics

## 2024-04-24 NOTE — Progress Notes (Unsigned)
   Established Patient Pulmonology Office Visit   Subjective:  Patient ID: [name], female    DOB: [birth year]  MRN: [id number]  CC: No chief complaint on file.   HPI  [name] is a 58 y/o F with a hx of BPD, HTN, mild intermittent asthma, and OSA who presents for follow up.  She previously saw Dr. Alva for sleep apnea and was on CPAP with a nasal interface.  Prior sleep studies: 03/2016 NPSG ([facility] ) >> AHI 11.9/hour, mainly REM related lowest desaturation 86% , severe PLM's-weight 186 pounds 04/2016 titration >> CPAP 9 cm -185 pounds Split night 03/2023 --> AHI 10.8 and while supine 27 with mild oxygen desaturation, nadir 86%  {PULM QUESTIONNAIRES (Optional):33196}  ROS  {History (Optional):23778}  Current Outpatient Medications:    albuterol  (VENTOLIN  HFA) 108 (90 Base) MCG/ACT inhaler, Inhale 2 puffs into the lungs every 6 (six) hours as needed for wheezing or shortness of breath., Disp: 8 g, Rfl: 2   amLODipine  (NORVASC ) 10 MG tablet, TAKE 1 TABLET(10 MG) BY MOUTH DAILY, Disp: 90 tablet, Rfl: 1   carvedilol  (COREG ) 6.25 MG tablet, Take 1 tablet (6.25 mg total) by mouth 2 (two) times daily., Disp: 180 tablet, Rfl: 3   clobetasol  ointment (TEMOVATE ) 0.05 %, Apply to affected area every night for 4 weeks, then every other day for 4 weeks and then twice a week for 4 weeks or until resolution. (Patient taking differently: Apply 1 Application topically daily as needed. Apply to affected area every night for 4 weeks, then every other day for 4 weeks and then twice a week for 4 weeks or until resolution.), Disp: 30 g, Rfl: 6   EPINEPHrine  0.3 mg/0.3 mL IJ SOAJ injection, Inject 0.3 mg into the muscle as needed for anaphylaxis. Inject into the muscle., Disp: 1 each, Rfl: 1   losartan -hydrochlorothiazide  (HYZAAR) 100-25 MG tablet, TAKE 1 TABLET BY MOUTH DAILY, Disp: 90 tablet, Rfl: 1   rosuvastatin  (CRESTOR ) 20 MG tablet, Take 1 tablet (20 mg total) by mouth daily., Disp: 90 tablet,  Rfl: 3   tiZANidine  (ZANAFLEX ) 4 MG tablet, Take 1 tablet (4 mg total) by mouth every 6 (six) hours as needed for muscle spasms., Disp: 60 tablet, Rfl: 3      Objective:  There were no vitals taken for this visit. {Pulm Vitals (Optional):32837}  Physical Exam   Diagnostic Review:  {Labs (Optional):32838}  CTA Chest 11/2023 reviewed independently and shows no evidence of pulmonary emboli or any parenchymal lung abnormalities. There's some minimal scarring in the lingula.    Assessment & Plan:   Assessment & Plan   No orders of the defined types were placed in this encounter.     No follow-ups on file.   Justinn Welter, MD

## 2024-04-25 ENCOUNTER — Telehealth: Payer: Self-pay

## 2024-04-25 ENCOUNTER — Other Ambulatory Visit (HOSPITAL_COMMUNITY)
Admission: RE | Admit: 2024-04-25 | Discharge: 2024-04-25 | Disposition: A | Discharge status: Home / Self Care | Payer: Self-pay | Source: Ambulatory Visit | Attending: Medical Genetics | Admitting: Medical Genetics

## 2024-04-25 NOTE — Telephone Encounter (Signed)
 Called for prechats - if pt is using cpap to bring sd card

## 2024-04-25 NOTE — Telephone Encounter (Signed)
 Copied from CRM #[id number]. Topic: Appointments - Scheduling Inquiry for Clinic >> Apr 25, 2024  2:18 PM Chantha C wrote: Reason for CRM: Patient is returning [name]'s call. Patient states does not have cpap machine anymore, and cannot go into [place], patient's transportation will take her there. Per CAL, CMA is unavailable, send a CRM. Please advise and call back [phone].  Spoke with pt per prechart

## 2024-04-26 ENCOUNTER — Ambulatory Visit (INDEPENDENT_AMBULATORY_CARE_PROVIDER_SITE_OTHER): Admitting: Pulmonary Disease

## 2024-04-26 ENCOUNTER — Telehealth: Payer: Self-pay | Admitting: Pulmonary Disease

## 2024-04-26 ENCOUNTER — Encounter: Payer: Self-pay | Admitting: Pulmonary Disease

## 2024-04-26 VITALS — BP 126/70 | HR 81 | Ht 61.0 in | Wt 180.4 lb

## 2024-04-26 DIAGNOSIS — J4530 Mild persistent asthma, uncomplicated: Secondary | ICD-10-CM

## 2024-04-26 DIAGNOSIS — R0609 Other forms of dyspnea: Secondary | ICD-10-CM

## 2024-04-26 DIAGNOSIS — G4733 Obstructive sleep apnea (adult) (pediatric): Secondary | ICD-10-CM | POA: Diagnosis not present

## 2024-04-26 DIAGNOSIS — Z6834 Body mass index (BMI) 34.0-34.9, adult: Secondary | ICD-10-CM

## 2024-04-26 MED ORDER — FLUTICASONE-SALMETEROL 115-21 MCG/ACT IN AERO: 2.0000 | INHALATION_SPRAY | Freq: Two times a day (BID) | RESPIRATORY_TRACT | 12 refills | Status: DC

## 2024-04-26 NOTE — Assessment & Plan Note (Signed)
 Patient has typical asthma triggers as documented above. She is only on as needed albuterol . She noted that Advair previously worked well for her. Therefore, I resubmitted order for Advair HFA. She can continue to use albuterol  MDI as needed.

## 2024-04-26 NOTE — Patient Instructions (Addendum)
-   I sent CPAP orders to our admin staff. They will try to set you up as soon as possible - Come back to our office 30 - 90 days after you started the CPAP therapy for compliance check - Start Advair inhaler 2 puffs twice daily, rinse your mouth after use - Get breathing test done before next visit. - Please continue to work on losing weight

## 2024-04-26 NOTE — Telephone Encounter (Signed)
 LVM for patient regarding the Tuesday 07/05/24 8:30 am PFT appointment at [facility] time is 8:15 am--1st floor registration desk---follow up with Dr. Catherine is Tuesday 07/19/24 at 11:00 am---requested return call with questions or concerns

## 2024-05-02 ENCOUNTER — Telehealth: Payer: Self-pay

## 2024-05-02 NOTE — Telephone Encounter (Signed)
 Copied from CRM #[id number]. Topic: Clinical - Prescription Issue >> Apr 29, 2024  9:29 AM Isabell A wrote: Reason for CRM: Patient states Advair liquid is not covered by her insurance, requesting to resend in powder form.   [id number] - [place], [place] - [address], [place] [address]   PA for Advair inhaler?

## 2024-05-03 ENCOUNTER — Other Ambulatory Visit: Payer: Self-pay

## 2024-05-03 ENCOUNTER — Other Ambulatory Visit (HOSPITAL_COMMUNITY): Payer: Self-pay

## 2024-05-03 DIAGNOSIS — J4530 Mild persistent asthma, uncomplicated: Secondary | ICD-10-CM

## 2024-05-03 LAB — GENECONNECT MOLECULAR SCREEN: Genetic Analysis Overall Interpretation: NEGATIVE

## 2024-05-03 NOTE — Telephone Encounter (Signed)
 This is the inhaler that the Dr sent to pharmacy and pt stated her insurance wont cover this. Is there any other options?  fluticasone -salmeterol (ADVAIR HFA) 115-21 MCG/ACT inhaler

## 2024-05-04 ENCOUNTER — Other Ambulatory Visit: Payer: Self-pay

## 2024-05-04 MED ORDER — FLUTICASONE-SALMETEROL 500-50 MCG/ACT IN AEPB: 1.0000 | INHALATION_SPRAY | Freq: Two times a day (BID) | RESPIRATORY_TRACT | 6 refills | Status: AC

## 2024-05-04 NOTE — Telephone Encounter (Signed)
 You can send Advair Diskus which is the dry powder form. The Advair HFA is the mist form. Please send Advair Diskus.

## 2024-05-04 NOTE — Telephone Encounter (Signed)
 Called and informed pt that Dr Nola has resent the powder advair to the pharmacy and to rinse mouth after use.

## 2024-05-04 NOTE — Telephone Encounter (Signed)
 Copied from CRM #[id number]. Topic: Clinical - Prescription Issue >> Apr 29, 2024  9:29 AM [name] wrote: Reason for CRM: Patient states Advair liquid is not covered by her insurance, requesting to resend in powder form.   [id number] - [place], [place] - [address], [place] [address] >> May 03, 2024 10:26 AM [name] wrote: Patient [id number] is returning the office call, no message was left, unsure if the call is regarding Advair medication coverage? Unable to reached CAL. Please advise and call back.    Pt is stating that we sent in the spray form of Advair and is not covered by insurance and is now requesting the generic powder form of Advair. I only see one generic Advair and it does not specify if its spray or powder

## 2024-05-13 DIAGNOSIS — I10 Essential (primary) hypertension: Secondary | ICD-10-CM | POA: Diagnosis not present

## 2024-05-13 DIAGNOSIS — Z87891 Personal history of nicotine dependence: Secondary | ICD-10-CM | POA: Diagnosis not present

## 2024-05-13 DIAGNOSIS — M51362 Other intervertebral disc degeneration, lumbar region with discogenic back pain and lower extremity pain: Secondary | ICD-10-CM | POA: Diagnosis not present

## 2024-05-13 DIAGNOSIS — M797 Fibromyalgia: Secondary | ICD-10-CM | POA: Diagnosis not present

## 2024-05-13 DIAGNOSIS — M479 Spondylosis, unspecified: Secondary | ICD-10-CM | POA: Diagnosis not present

## 2024-05-13 DIAGNOSIS — Z7689 Persons encountering health services in other specified circumstances: Secondary | ICD-10-CM | POA: Diagnosis not present

## 2024-05-13 DIAGNOSIS — E782 Mixed hyperlipidemia: Secondary | ICD-10-CM | POA: Diagnosis not present

## 2024-05-27 DIAGNOSIS — M479 Spondylosis, unspecified: Secondary | ICD-10-CM | POA: Diagnosis not present

## 2024-05-27 DIAGNOSIS — I10 Essential (primary) hypertension: Secondary | ICD-10-CM | POA: Diagnosis not present

## 2024-05-27 DIAGNOSIS — M51362 Other intervertebral disc degeneration, lumbar region with discogenic back pain and lower extremity pain: Secondary | ICD-10-CM | POA: Diagnosis not present

## 2024-05-27 DIAGNOSIS — M797 Fibromyalgia: Secondary | ICD-10-CM | POA: Diagnosis not present

## 2024-05-30 ENCOUNTER — Encounter: Payer: Self-pay | Admitting: Radiology

## 2024-05-30 DIAGNOSIS — H5789 Other specified disorders of eye and adnexa: Secondary | ICD-10-CM | POA: Diagnosis not present
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
## 2024-06-06 ENCOUNTER — Encounter: Payer: Self-pay | Admitting: Cardiology

## 2024-06-06 ENCOUNTER — Ambulatory Visit: Attending: Cardiology | Admitting: Cardiology

## 2024-06-06 VITALS — BP 101/67 | HR 73 | Ht 61.0 in | Wt 181.4 lb

## 2024-06-06 DIAGNOSIS — R072 Precordial pain: Secondary | ICD-10-CM

## 2024-06-06 DIAGNOSIS — E785 Hyperlipidemia, unspecified: Secondary | ICD-10-CM

## 2024-06-06 DIAGNOSIS — I10 Essential (primary) hypertension: Secondary | ICD-10-CM

## 2024-06-06 NOTE — Patient Instructions (Signed)
 Medication Instructions:  NO CHANGES.  Lab Work: NONE TO BE DONE TODAY.  Testing/Procedures: NONE  Follow-Up: At [facility], you and your health needs are our priority.  As part of our continuing mission to provide you with exceptional heart care, our providers are all part of one team.  This team includes your primary Cardiologist (physician) and Advanced Practice Providers or APPs (Physician Assistants and Nurse Practitioners) who all work together to provide you with the care you need, when you need it.  Your next appointment:   1 YEAR  Provider:   DR. LONNI NANAS, MD

## 2024-06-13 ENCOUNTER — Other Ambulatory Visit: Payer: Self-pay | Admitting: Internal Medicine

## 2024-06-13 DIAGNOSIS — I10 Essential (primary) hypertension: Secondary | ICD-10-CM

## 2024-06-16 ENCOUNTER — Ambulatory Visit

## 2024-07-05 ENCOUNTER — Ambulatory Visit (HOSPITAL_COMMUNITY)

## 2024-07-19 ENCOUNTER — Ambulatory Visit: Admitting: Pulmonary Disease

## 2024-08-01 ENCOUNTER — Ambulatory Visit: Admitting: Pulmonary Disease

## 2024-08-18 ENCOUNTER — Ambulatory Visit (HOSPITAL_COMMUNITY): Admission: RE | Admit: 2024-08-18 | Source: Ambulatory Visit

## 2024-08-23 ENCOUNTER — Ambulatory Visit: Payer: 59

## 2024-09-06 ENCOUNTER — Encounter (HOSPITAL_COMMUNITY)

## 2024-10-04 ENCOUNTER — Ambulatory Visit: Admitting: Pulmonary Disease
